# Patient Record
Sex: Female | Born: 1950
Health system: Southern US, Community
[De-identification: ages and names within clinical notes are randomized; demographics above are authoritative.]

## PROBLEM LIST (undated history)

## (undated) DIAGNOSIS — C50412 Malignant neoplasm of upper-outer quadrant of left female breast: Secondary | ICD-10-CM

## (undated) DIAGNOSIS — Z1501 Genetic susceptibility to malignant neoplasm of breast: Secondary | ICD-10-CM

## (undated) DIAGNOSIS — Z800 Family history of malignant neoplasm of digestive organs: Secondary | ICD-10-CM

## (undated) DIAGNOSIS — C50919 Malignant neoplasm of unspecified site of unspecified female breast: Secondary | ICD-10-CM

## (undated) DIAGNOSIS — E559 Vitamin D deficiency, unspecified: Secondary | ICD-10-CM

## (undated) DIAGNOSIS — Z803 Family history of malignant neoplasm of breast: Secondary | ICD-10-CM

## (undated) DIAGNOSIS — Z1509 Genetic susceptibility to other malignant neoplasm: Secondary | ICD-10-CM

## (undated) DIAGNOSIS — IMO0002 Reserved for concepts with insufficient information to code with codable children: Secondary | ICD-10-CM

## (undated) DIAGNOSIS — R112 Nausea with vomiting, unspecified: Secondary | ICD-10-CM

## (undated) DIAGNOSIS — Z923 Personal history of irradiation: Secondary | ICD-10-CM

## (undated) DIAGNOSIS — J309 Allergic rhinitis, unspecified: Secondary | ICD-10-CM

## (undated) DIAGNOSIS — E785 Hyperlipidemia, unspecified: Secondary | ICD-10-CM

## (undated) DIAGNOSIS — Z9221 Personal history of antineoplastic chemotherapy: Secondary | ICD-10-CM

## (undated) DIAGNOSIS — M1990 Unspecified osteoarthritis, unspecified site: Secondary | ICD-10-CM

## (undated) DIAGNOSIS — K5792 Diverticulitis of intestine, part unspecified, without perforation or abscess without bleeding: Secondary | ICD-10-CM

## (undated) DIAGNOSIS — M8580 Other specified disorders of bone density and structure, unspecified site: Secondary | ICD-10-CM

## (undated) DIAGNOSIS — I10 Essential (primary) hypertension: Secondary | ICD-10-CM

## (undated) DIAGNOSIS — I728 Aneurysm of other specified arteries: Secondary | ICD-10-CM

## (undated) DIAGNOSIS — G629 Polyneuropathy, unspecified: Secondary | ICD-10-CM

## (undated) DIAGNOSIS — D649 Anemia, unspecified: Secondary | ICD-10-CM

## (undated) DIAGNOSIS — Z9889 Other specified postprocedural states: Secondary | ICD-10-CM

## (undated) HISTORY — DX: Anemia, unspecified: D64.9

## (undated) HISTORY — PX: OTHER SURGICAL HISTORY: SHX169

## (undated) HISTORY — DX: Hyperlipidemia, unspecified: E78.5

## (undated) HISTORY — DX: Vitamin D deficiency, unspecified: E55.9

## (undated) HISTORY — DX: Diverticulitis of intestine, part unspecified, without perforation or abscess without bleeding: K57.92

## (undated) HISTORY — PX: LUMBAR DISC SURGERY: SHX700

## (undated) HISTORY — DX: Other specified disorders of bone density and structure, unspecified site: M85.80

## (undated) HISTORY — DX: Unspecified osteoarthritis, unspecified site: M19.90

## (undated) HISTORY — DX: Family history of malignant neoplasm of breast: Z80.3

## (undated) HISTORY — PX: SPINE SURGERY: SHX786

## (undated) HISTORY — DX: Aneurysm of other specified arteries: I72.8

## (undated) HISTORY — PX: TONSILLECTOMY: SUR1361

## (undated) HISTORY — DX: Reserved for concepts with insufficient information to code with codable children: IMO0002

## (undated) HISTORY — DX: Family history of malignant neoplasm of digestive organs: Z80.0

## (undated) HISTORY — DX: Essential (primary) hypertension: I10

## (undated) HISTORY — DX: Allergic rhinitis, unspecified: J30.9

---
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
## 1996-09-01 HISTORY — PX: SPINE SURGERY: SHX786

## 1996-09-01 HISTORY — PX: CERVICAL DISC SURGERY: SHX588

## 1998-05-01 ENCOUNTER — Encounter: Admission: RE | Admit: 1998-05-01 | Discharge: 1998-07-30 | Payer: Self-pay | Admitting: Neurological Surgery

## 1998-06-18 ENCOUNTER — Ambulatory Visit (HOSPITAL_COMMUNITY): Admission: RE | Admit: 1998-06-18 | Discharge: 1998-06-18 | Payer: Self-pay | Admitting: Obstetrics and Gynecology

## 1998-07-31 ENCOUNTER — Encounter: Payer: Self-pay | Admitting: Neurological Surgery

## 1998-07-31 ENCOUNTER — Ambulatory Visit (HOSPITAL_COMMUNITY): Admission: RE | Admit: 1998-07-31 | Discharge: 1998-07-31 | Payer: Self-pay | Admitting: Neurological Surgery

## 1998-11-20 ENCOUNTER — Encounter: Admission: RE | Admit: 1998-11-20 | Discharge: 1998-12-12 | Payer: Self-pay | Admitting: Neurological Surgery

## 1999-12-10 ENCOUNTER — Encounter: Payer: Self-pay | Admitting: Neurological Surgery

## 1999-12-10 ENCOUNTER — Ambulatory Visit (HOSPITAL_COMMUNITY): Admission: RE | Admit: 1999-12-10 | Discharge: 1999-12-10 | Payer: Self-pay | Admitting: Neurological Surgery

## 1999-12-24 ENCOUNTER — Encounter: Admission: RE | Admit: 1999-12-24 | Discharge: 1999-12-24 | Payer: Self-pay | Admitting: Obstetrics and Gynecology

## 1999-12-24 ENCOUNTER — Encounter: Payer: Self-pay | Admitting: Obstetrics and Gynecology

## 2000-02-28 ENCOUNTER — Other Ambulatory Visit: Admission: RE | Admit: 2000-02-28 | Discharge: 2000-02-28 | Payer: Self-pay | Admitting: Obstetrics and Gynecology

## 2000-05-20 ENCOUNTER — Encounter: Payer: Self-pay | Admitting: Neurological Surgery

## 2000-05-20 ENCOUNTER — Encounter: Admission: RE | Admit: 2000-05-20 | Discharge: 2000-05-20 | Payer: Self-pay | Admitting: Neurological Surgery

## 2000-06-11 ENCOUNTER — Encounter: Payer: Self-pay | Admitting: Neurological Surgery

## 2000-06-15 ENCOUNTER — Encounter: Payer: Self-pay | Admitting: Neurological Surgery

## 2000-06-15 ENCOUNTER — Ambulatory Visit (HOSPITAL_COMMUNITY): Admission: RE | Admit: 2000-06-15 | Discharge: 2000-06-16 | Payer: Self-pay | Admitting: Neurological Surgery

## 2000-08-19 ENCOUNTER — Encounter: Payer: Self-pay | Admitting: Neurological Surgery

## 2000-08-19 ENCOUNTER — Encounter: Admission: RE | Admit: 2000-08-19 | Discharge: 2000-08-19 | Payer: Self-pay | Admitting: Neurological Surgery

## 2001-04-15 ENCOUNTER — Encounter: Payer: Self-pay | Admitting: Internal Medicine

## 2001-04-15 ENCOUNTER — Ambulatory Visit (HOSPITAL_COMMUNITY): Admission: RE | Admit: 2001-04-15 | Discharge: 2001-04-15 | Payer: Self-pay | Admitting: Internal Medicine

## 2001-04-19 ENCOUNTER — Encounter: Payer: Self-pay | Admitting: Obstetrics and Gynecology

## 2001-04-19 ENCOUNTER — Encounter: Admission: RE | Admit: 2001-04-19 | Discharge: 2001-04-19 | Payer: Self-pay | Admitting: Obstetrics and Gynecology

## 2001-06-15 ENCOUNTER — Other Ambulatory Visit: Admission: RE | Admit: 2001-06-15 | Discharge: 2001-06-15 | Payer: Self-pay | Admitting: Obstetrics and Gynecology

## 2001-09-16 ENCOUNTER — Ambulatory Visit (HOSPITAL_COMMUNITY): Admission: RE | Admit: 2001-09-16 | Discharge: 2001-09-16 | Payer: Self-pay | Admitting: Anesthesiology

## 2002-04-20 ENCOUNTER — Encounter: Admission: RE | Admit: 2002-04-20 | Discharge: 2002-04-20 | Payer: Self-pay | Admitting: Neurological Surgery

## 2002-04-20 ENCOUNTER — Encounter: Payer: Self-pay | Admitting: Neurological Surgery

## 2003-04-05 ENCOUNTER — Encounter: Payer: Self-pay | Admitting: Obstetrics and Gynecology

## 2003-04-05 ENCOUNTER — Encounter: Admission: RE | Admit: 2003-04-05 | Discharge: 2003-04-05 | Payer: Self-pay | Admitting: Obstetrics and Gynecology

## 2005-04-24 ENCOUNTER — Encounter: Admission: RE | Admit: 2005-04-24 | Discharge: 2005-04-24 | Payer: Self-pay | Admitting: Obstetrics and Gynecology

## 2006-08-26 ENCOUNTER — Encounter: Admission: RE | Admit: 2006-08-26 | Discharge: 2006-08-26 | Payer: Self-pay | Admitting: Internal Medicine

## 2007-09-27 ENCOUNTER — Encounter: Admission: RE | Admit: 2007-09-27 | Discharge: 2007-09-27 | Payer: Self-pay | Admitting: Internal Medicine

## 2009-02-02 ENCOUNTER — Encounter: Admission: RE | Admit: 2009-02-02 | Discharge: 2009-02-02 | Payer: Self-pay | Admitting: Neurological Surgery

## 2009-03-29 ENCOUNTER — Ambulatory Visit (HOSPITAL_COMMUNITY): Admission: RE | Admit: 2009-03-29 | Discharge: 2009-03-30 | Payer: Self-pay | Admitting: Neurological Surgery

## 2009-07-03 ENCOUNTER — Encounter: Admission: RE | Admit: 2009-07-03 | Discharge: 2009-07-03 | Payer: Self-pay | Admitting: Internal Medicine

## 2010-09-19 ENCOUNTER — Other Ambulatory Visit
Admission: RE | Admit: 2010-09-19 | Discharge: 2010-09-19 | Payer: Self-pay | Source: Home / Self Care | Admitting: Internal Medicine

## 2010-11-12 ENCOUNTER — Other Ambulatory Visit: Payer: Self-pay | Admitting: Internal Medicine

## 2010-11-12 DIAGNOSIS — Z1231 Encounter for screening mammogram for malignant neoplasm of breast: Secondary | ICD-10-CM

## 2010-11-18 ENCOUNTER — Ambulatory Visit
Admission: RE | Admit: 2010-11-18 | Discharge: 2010-11-18 | Disposition: A | Discharge status: Home / Self Care | Payer: BC Managed Care – PPO | Source: Ambulatory Visit | Attending: Internal Medicine | Admitting: Internal Medicine

## 2010-11-18 DIAGNOSIS — Z1231 Encounter for screening mammogram for malignant neoplasm of breast: Secondary | ICD-10-CM

## 2010-12-08 LAB — BASIC METABOLIC PANEL
Calcium: 10.1 mg/dL (ref 8.4–10.5)
GFR calc non Af Amer: >60 mL/min (ref >60)
Glucose, Bld: 102 mg/dL — ABNORMAL HIGH (ref 70–99)
Sodium: 137 mEq/L (ref 135–145)

## 2010-12-08 LAB — CBC
MCHC: 33.2 g/dL (ref 30.0–36.0)
RBC: 4.57 MIL/uL (ref 3.87–5.11)
WBC: 10.4 10*3/uL (ref 4.0–10.5)

## 2011-01-14 NOTE — Op Note (Signed)
Jacqueline Jenkins, Jacqueline Jenkins                 ACCOUNT NO.:  [id number]   MEDICAL RECORD NO.:  [id number]          PATIENT TYPE:  OIB   LOCATION:  3537                         FACILITY:  [facility]   PHYSICIAN:  Stefani Dama, M.D.  DATE OF BIRTH:  1951/05/31   DATE OF PROCEDURE:  03/29/2009  DATE OF DISCHARGE:                               OPERATIVE REPORT   PREOPERATIVE DIAGNOSES:  1. Lumbar spondylosis.  2. Lumbar radiculopathy secondary to herniated nucleus pulposus, L5-S1      right with extraforaminal herniation of disk.   POSTOPERATIVE DIAGNOSES:  1. Lumbar spondylosis.  2. Lumbar radiculopathy secondary to herniated nucleus pulposus, L5-S1      right with extraforaminal herniation of disk.   PROCEDURE:  Matrix intraforaminal and extraforaminal microdiskectomy  with operating microscope, L5-S1 right   SURGEON:  Stefani Dama, MD   FIRST ASSISTANT:  Cristi Loron, MD   ANESTHESIA:  General endotracheal.   INDICATIONS:  Ryin is a 60 year old individual who has had significant  lumbar spondylosis with radicular pain in L5 distribution.  She has an  extruded fragment of disk at L5-S1, but this is the fragment extends  into the foramen, appears to be compromising at L5 nerve root.  After  failing efforts at conservative management, she was advised regarding  surgical intervention via Matrix intraforaminal and extraforaminal  decompression.   PROCEDURE:  The patient was brought to the operating room supine on a  stretcher.  After smooth induction of general endotracheal anesthesia,  she was turned prone.  Back was prepped with alcohol and DuraPrep and  draped in sterile fashion.  Fluoroscopic guidance was used to localize  L5-S1 both intraforaminally and extraforaminally with the region of the  pars being identified and a K-wire was passed to the region of the pars  and anchored on it.  A small vertical incision was created over the K-  wire and then a series of dilators were  passed over the K-wire to use a  winding technique to provide soft tissue dissection in the depths  ultimately a 5-cm deep x 18-mm wide.  Endoscopic cannula was fixed to  the operating table with a clamp.  Through this aperture, the operating  microscope was placed to view the deep structures.  The soft tissues  were cleared from the pars of L5 and the laminar arch of L5 was also  identified.  The laminotomy was then created using high-speed drill and  2.3-mm dissecting tool to create a hole in the laminar arch medial to  the pars.  This was right at the level of the disk space and by  dissecting intraforaminally along the lateral aspect of the dura.  This  L5 nerve root was identified superiorly and immediately adjacent to the  L5 nerve root were found some intraforaminal portions of disk material.  These were decompressed.  There was felt that there was further material  out in the lateral recess of the foramen.  At this point, an  extraforaminal dissection was undertaken by moving the endoscope  laterally over the region of the pars  where the soft tissues were  cleared.  The superior articular process of S1 was drilled down with a  high-speed drill and 2.3-mm dissecting tool, lateral aspect of the pars  and the superior portion of the inferior articular process was also  drilled down.  In this region then, we were able to identify the L5  nerve root that it was bowed dorsally and dissection below the L5 nerve  root found a number of fragments that were compressing the L5 nerve  root.  These were removed in a piecemeal fashion and dissection was  continued until all the fragments were removed in this area.  With this,  the lateral aspect of the nerve root and the medial aspect were well  decompressed.  Returned to the intraforaminal portion of the  decompression yielded no other fragments of disk and now the shoulder of  the root could be easily sounded and found to be free and well   decompressed.  With this, hemostasis in the soft tissues intra and  extraforaminally was obtained meticulously and then the microscope was  removed.  The wound was irrigated with antibiotic irrigating solution.  Then, the lumbodorsal fascia was closed with a singular 3-0 Vicryl  suture and the skin, subcutaneous, and subcuticular tissues were also  closed with 3-0 Vicryl.  Dermabond was placed on the skin.  Blood loss  for this procedure was estimated less than 50 mL.      Stefani Dama, M.D.  Electronically Signed     HJE/MEDQ  D:  03/29/2009  T:  03/30/2009  Job:  [id number]

## 2011-01-17 NOTE — Op Note (Signed)
. [facility]  Patient:    SHALECE, STAFFA Visit Number: [id number] MRN: [id number]          Service Type: END Location: ENDO Attending Physician:  Orland Mustard Dictated by:   Llana Aliment. Randa Evens, M.D. Proc. Date: 09/16/01 Admit Date:  09/16/2001   CC:         Marinus Maw, M.D.   Operative Report  PROCEDURE PERFORMED:  Colonoscopy.  ENDOSCOPIST:  Llana Aliment. Randa Evens, M.D.  MEDICATIONS USED:  Fentanyl 50 mcg.  Versed 5 mg IV.  INSTRUMENT:  Pediatric Olympus video colonoscope.  INDICATIONS:  Strong family history of colon polyps, colon cancer as well as pancreatic cancer.  This is done as a screening procedure.  DESCRIPTION OF PROCEDURE:  The procedure had been explained to the patient and consent obtained.  With the patient in the left lateral decubitus position, the Olympus video colonoscope was inserted and advanced under direct visualization.  The prep was excellent and we were able to advance to the cecum without difficulty.  The scope was withdrawn.  The cecum, ascending colon, hepatic flexure, transverse colon, splenic flexure, descending and sigmoid colon were seen well upon withdrawal.  Scope withdrawn, patient tolerated the procedure well.  Maintained on low flow oxygen and pulse oximeter.  No polyps were seen throughout the entire colon.  No significant diverticular disease.  ASSESSMENT:  No evidence of polyps in this high risk individual.  PLAN: Will recommend repeating in five years. Dictated by:   Llana Aliment. Randa Evens, M.D. Attending Physician:  Orland Mustard DD:  09/16/01 TD:  09/16/01 Job: [id number]

## 2011-01-17 NOTE — Op Note (Signed)
[facility]  Patient:    Jacqueline Jenkins, Jacqueline Jenkins                        MRN: [id number] Proc. Date: 06/15/00 Adm. Date:  [id number] Attending:  Jonne Ply                           Operative Report  PREOPERATIVE DIAGNOSIS:  L4-5 herniated nucleus pulposus on the right with right lumbar radiculopathy.  POSTOPERATIVE DIAGNOSIS:  L4-5 herniated nucleus pulposus on the right with right lumbar radiculopathy.  PROCEDURE:  Right lumbar microendoscopic diskectomy L4-5 with operating microscope and microdissection technique.  SURGEON:  Stefani Dama, M.D.  ANESTHESIA:  General endotracheal anesthesia.  INDICATIONS:  The patient is a 60 year old individual who has had significant back and right lower extremity pain.  She has demonstrated herniated nucleus pulposus at L4-5 on the right side.  She has failed conservative efforts and is advised regarding surgical treatment.  PROCEDURE:  The patient was brought to the operating room supine on the stretcher.  After smooth induction of general endotracheal anesthesia she was turned prone and the back was shaved and prepped with Duraprep and draped in a sterile fashion.  The L4-5 space was localized radiographically and the insertion site was chosen for a K-wire.  After infiltrating this area with 1% lidocaine with epinephrine making a small incision in the area, the K-wire was passed down with lateral fluoroscopic projections being obtained.  Then a series of dilators from the Met-RX system was passed to the 18 mm size.  A 5 cm deep cannula was inserted and this was affixed to the operating table.  The microscope was draped and brought into the field.  Then, using microdissection technique, the laminar arch was cleared of muscular attachment.  The yellow ligament was cleared also and a laminotomy was created removing the inferior marginal lamina of L4 out to the mesial wall of the facet.  A series of  punctures and rongeurs were used to clear the remaining yellow ligament, mesial wall facet and the common dural tube was exposed.  The L5 nerve root was exposed just lateral to this region.  It was noted to be tented ventrally over a mass.  This was found to be significant disk herniation in the subligamentous space.  After dissecting the nerve root with microdissection technique and cauterizing some epidural veins around it and dividing these with microscissors, the nerve root was mobilized medially.  The disk was identified and incised in vertical fashion and several small fragments of disk material were evacuated from the subligamentous space. There was more disk material that was attached to the annular region and this required a more complete dissection of the disk space using a combination of curets, rongeurs and pituitary instruments so as to evacuate the disc space of significant quantity of markedly degenerated disk material.  This was pursued both medially and laterally and in the end, good clearance of the disk space was obtained.  Hemostasis from the soft tissue was obtained with bipolar cautery.  Care was taken to protect the L5 nerve root in the common dural tube during this entire procedure.  No dural tears or cerebrospinal fluid leaks were encountered.  The area once well decompressed was checked.  It was noted that the L5 nerve root was fully decompressed.  The common dural tube was free and clear of any obstruction.  After copious irrigating with antibiotic irrigating solution, the cannula was removed.  A singular 2-0 Vicryl suture was placed in the figure-of-eight fashion and the fascia.  Subcuticular 3-0 Vicryl was used to close the skin.  Blood loss estimated at 100 cc.  The patient tolerated the procedure well. DD:  06/15/00 TD:  06/15/00 Job: [id number]

## 2011-09-02 DIAGNOSIS — I728 Aneurysm of other specified arteries: Secondary | ICD-10-CM

## 2011-09-02 HISTORY — DX: Aneurysm of other specified arteries: I72.8

## 2011-12-01 ENCOUNTER — Other Ambulatory Visit: Payer: Self-pay | Admitting: Physician Assistant

## 2011-12-01 ENCOUNTER — Other Ambulatory Visit: Payer: Self-pay | Admitting: Internal Medicine

## 2011-12-01 DIAGNOSIS — Z1231 Encounter for screening mammogram for malignant neoplasm of breast: Secondary | ICD-10-CM

## 2011-12-01 DIAGNOSIS — M8580 Other specified disorders of bone density and structure, unspecified site: Secondary | ICD-10-CM

## 2011-12-01 HISTORY — DX: Other specified disorders of bone density and structure, unspecified site: M85.80

## 2011-12-05 ENCOUNTER — Ambulatory Visit
Admission: RE | Admit: 2011-12-05 | Discharge: 2011-12-05 | Disposition: A | Discharge status: Home / Self Care | Payer: BC Managed Care – PPO | Source: Ambulatory Visit | Attending: Physician Assistant | Admitting: Physician Assistant

## 2011-12-05 DIAGNOSIS — Z1231 Encounter for screening mammogram for malignant neoplasm of breast: Secondary | ICD-10-CM

## 2011-12-18 ENCOUNTER — Other Ambulatory Visit (HOSPITAL_COMMUNITY): Payer: Self-pay | Admitting: Internal Medicine

## 2011-12-18 DIAGNOSIS — Z13820 Encounter for screening for osteoporosis: Secondary | ICD-10-CM

## 2011-12-19 ENCOUNTER — Other Ambulatory Visit (HOSPITAL_COMMUNITY): Payer: Self-pay | Admitting: Internal Medicine

## 2011-12-19 DIAGNOSIS — R109 Unspecified abdominal pain: Secondary | ICD-10-CM

## 2011-12-19 DIAGNOSIS — R7989 Other specified abnormal findings of blood chemistry: Secondary | ICD-10-CM

## 2011-12-23 ENCOUNTER — Ambulatory Visit (HOSPITAL_COMMUNITY)
Admission: RE | Admit: 2011-12-23 | Discharge: 2011-12-23 | Disposition: A | Discharge status: Home / Self Care | Payer: BC Managed Care – PPO | Source: Ambulatory Visit | Attending: Internal Medicine | Admitting: Internal Medicine

## 2011-12-23 DIAGNOSIS — Z780 Asymptomatic menopausal state: Secondary | ICD-10-CM | POA: Insufficient documentation

## 2011-12-23 DIAGNOSIS — R109 Unspecified abdominal pain: Secondary | ICD-10-CM

## 2011-12-23 DIAGNOSIS — R7989 Other specified abnormal findings of blood chemistry: Secondary | ICD-10-CM

## 2011-12-23 DIAGNOSIS — Z13820 Encounter for screening for osteoporosis: Secondary | ICD-10-CM | POA: Insufficient documentation

## 2011-12-24 ENCOUNTER — Other Ambulatory Visit: Payer: Self-pay | Admitting: Internal Medicine

## 2011-12-24 DIAGNOSIS — R9389 Abnormal findings on diagnostic imaging of other specified body structures: Secondary | ICD-10-CM

## 2011-12-26 ENCOUNTER — Ambulatory Visit
Admission: RE | Admit: 2011-12-26 | Discharge: 2011-12-26 | Disposition: A | Discharge status: Home / Self Care | Payer: BC Managed Care – PPO | Source: Ambulatory Visit | Attending: Internal Medicine | Admitting: Internal Medicine

## 2011-12-26 DIAGNOSIS — R9389 Abnormal findings on diagnostic imaging of other specified body structures: Secondary | ICD-10-CM

## 2011-12-26 MED ORDER — IOHEXOL 300 MG/ML  SOLN
125.0000 mL | Freq: Once | INTRAMUSCULAR | Status: AC | PRN
  Administered 2011-12-26: 125 mL via INTRAVENOUS

## 2012-01-13 ENCOUNTER — Encounter: Payer: Self-pay | Admitting: Surgery

## 2012-01-15 LAB — HM COLONOSCOPY

## 2012-01-16 ENCOUNTER — Encounter: Payer: Self-pay | Admitting: Surgery

## 2012-01-19 ENCOUNTER — Ambulatory Visit (INDEPENDENT_AMBULATORY_CARE_PROVIDER_SITE_OTHER): Payer: BC Managed Care – PPO | Admitting: Surgery

## 2012-01-19 ENCOUNTER — Encounter: Payer: Self-pay | Admitting: Surgery

## 2012-01-19 VITALS — BP 138/83 | HR 77 | Temp 98.5°F | Ht 65.5 in | Wt 188.0 lb

## 2012-01-19 DIAGNOSIS — I728 Aneurysm of other specified arteries: Secondary | ICD-10-CM | POA: Insufficient documentation

## 2012-01-19 NOTE — Progress Notes (Signed)
[facility] [place]   Patient name: Jacqueline Jenkins MRN: [id number] DOB: 05/24/1951 Sex: female   Referred by: Dr. McKeown  Reason for referral:  Chief Complaint  Patient presents with  . AAA    New pt/ Dr. Mckallam    HISTORY OF PRESENT ILLNESS: Patient is referred today for evaluation of a newly diagnosed splenic artery aneurysm. She initially was sent for a bone density scan. She began having pain between her shoulder blades as well as postprandial nausea. This led to an ultrasound to evaluate for gallstones. A calcified left-sided mass was seen and was subsequently imaged with CT scan. This shows a large 4.6 x 4.0 cm splenic artery aneurysm without evidence of rupture. The patient reports that her initial symptoms have resolved. She denies back pain or left right upper quadrant pain.  The patient is married with 2 children who are in their 40s. She is medically managed for hypertension. Her blood pressure usually runs in the 120s over 70s. She also suffers from hypercholesterolemia however she cannot take a statins secondary to elevated liver enzymes. She does not smoke.  Past Medical History  Diagnosis Date  . Hypertension   . Hyperlipidemia   . DDD (degenerative disc disease)   . Anemia     Past Surgical History  Procedure Date  . Tonsillectomy   . Spine surgery 1998    cervical diskectomy  . Spine surgery 2001, 2010    Lumbar diskectomy  . Cervical disc surgery 1998  . Lumbar disc surgery 2001, 2010    History   Social History  . Marital Status: Married    Spouse Name: N/A    Number of Children: N/A  . Years of Education: N/A   Occupational History  . Not on file.   Social History Main Topics  . Smoking status: Never Smoker   . Smokeless tobacco: Never Used  . Alcohol Use: Yes     3-4 times per year  . Drug Use: No  . Sexually Active: Not on file   Other Topics Concern  . Not on file   Social History Narrative  . No narrative on  file    Family History  Problem Relation Age of Onset  . Stroke Mother   . Osteoporosis Mother   . Hyperlipidemia Mother   . Hypertension Mother   . Other Mother     varicose veins  . Cancer Maternal Grandmother 41    Breast cancer  . Other Father     bleeding problems    Allergies as of 01/19/2012 - Review Complete 01/19/2012  Allergen Reaction Noted  . Lescol (fluvastatin sodium)  01/13/2012  . Lipitor (atorvastatin)  01/13/2012  . Vytorin (ezetimibe-simvastatin)  01/13/2012    Current Outpatient Prescriptions on File Prior to Visit  Medication Sig Dispense Refill  . benazepril-hydrochlorthiazide (LOTENSIN HCT) 20-12.5 MG per tablet Take 1 tablet by mouth daily.      . colesevelam (WELCHOL) 625 MG tablet Take 1,875 mg by mouth 2 (two) times daily with a meal.      . Multiple Vitamin (MULTIVITAMIN) tablet Take 1 tablet by mouth daily.      . Vitamin D, Ergocalciferol, (DRISDOL) 50000 UNITS CAPS Take 50,000 Units by mouth every 7 (seven) days.         REVIEW OF SYSTEMS: Cardiovascular: No chest pain, chest pressure, orthopnea, or dyspnea on exertion. No claudication or rest pain,  No history of DVT or phlebitis. Positive for varicose veins, positive   for palpitations. She does have pain in her legs from walking and lying flat. Pulmonary: No productive cough, asthma or wheezing. Neurologic: No weakness, paresthesias, aphasia, or amaurosis. No dizziness. Hematologic: No bleeding problems or clotting disorders. Musculoskeletal: No joint pain or joint swelling. Gastrointestinal: No blood in stool or hematemesis Genitourinary: No dysuria or hematuria. Psychiatric:: No history of major depression. Integumentary: No rashes or ulcers. Constitutional: No fever or chills.  PHYSICAL EXAMINATION: General: The patient appears their stated age.  Vital signs are BP 138/83  Pulse 77  Temp(Src) 98.5 F (36.9 C) (Oral)  Ht 5' 5.5" (1.664 m)  Wt 188 lb (85.276 kg)  BMI 30.81 kg/m2   SpO2 98% HEENT:  No gross abnormalities Pulmonary: Respirations are non-labored Abdomen: Soft and non-tender. No bruits were auscultated. Pulsatile mass was not felt Musculoskeletal: There are no major deformities.   Neurologic: No focal weakness or paresthesias are detected, Skin: There are no ulcer or rashes noted. Psychiatric: The patient has normal affect. Cardiovascular: There is a regular rate and rhythm without significant murmur appreciated. No carotid bruits. Palpable pedal pulses.  Diagnostic Studies: I have reviewed the patient's CT scan which shows a large splenic artery aneurysm, 4.6 x 4.3 x 4.0   Assessment:  Large splenic artery aneurysm Plan: I discussed with the patient our options for management. These include continued observation which I would argue against given the size of her aneurysm. We also discussed open surgical repair which would include aneurysmorrhaphy versus resection, versus splenectomy. We then talked about minimally invasive measures. These would be limited to coil embolization versus covered stent placement. We have elected to proceed with endovascular repair. I've recommended that she go ahead and get vaccinated prior to her procedure. I discussed the potential risks which would include pain from postsplenectomy syndrome. She also understands that FI embolize the splenic artery she could have splenic infarction which in the worse case an area with require splenectomy. Her procedure has been scheduled for June 11. This will give her time to get her vaccines. She was told that if she experiences left upper quadrant pain to notify me versus contact EMS.     V. Wells Della Scrivener IV, M.D. [facility] [place] Office: [phone] Pager:  [phone]   

## 2012-01-28 ENCOUNTER — Other Ambulatory Visit: Payer: Self-pay

## 2012-01-30 ENCOUNTER — Encounter (HOSPITAL_COMMUNITY): Payer: Self-pay | Admitting: Pharmacy Technician

## 2012-02-09 MED ORDER — SODIUM CHLORIDE 0.9 % IV SOLN
INTRAVENOUS | Status: DC
  Administered 2012-02-10: 06:00:00 via INTRAVENOUS

## 2012-02-10 ENCOUNTER — Encounter (HOSPITAL_COMMUNITY): Payer: Self-pay | Admitting: *Deleted

## 2012-02-10 ENCOUNTER — Telehealth: Payer: Self-pay | Admitting: Surgery

## 2012-02-10 ENCOUNTER — Observation Stay (HOSPITAL_COMMUNITY)
Admission: RE | Admit: 2012-02-10 | Discharge: 2012-02-11 | Disposition: A | Discharge status: Home / Self Care | Payer: BC Managed Care – PPO | Source: Ambulatory Visit | Attending: Surgery | Admitting: Surgery

## 2012-02-10 ENCOUNTER — Other Ambulatory Visit: Payer: Self-pay

## 2012-02-10 ENCOUNTER — Encounter (HOSPITAL_COMMUNITY)
Admission: RE | Disposition: A | Discharge status: Home / Self Care | Payer: Self-pay | Source: Ambulatory Visit | Attending: Surgery

## 2012-02-10 DIAGNOSIS — I728 Aneurysm of other specified arteries: Principal | ICD-10-CM | POA: Insufficient documentation

## 2012-02-10 DIAGNOSIS — IMO0002 Reserved for concepts with insufficient information to code with codable children: Secondary | ICD-10-CM | POA: Insufficient documentation

## 2012-02-10 DIAGNOSIS — E785 Hyperlipidemia, unspecified: Secondary | ICD-10-CM | POA: Insufficient documentation

## 2012-02-10 DIAGNOSIS — I10 Essential (primary) hypertension: Secondary | ICD-10-CM | POA: Insufficient documentation

## 2012-02-10 HISTORY — PX: VISCERAL ANGIOGRAM: SHX5515

## 2012-02-10 HISTORY — PX: OTHER SURGICAL HISTORY: SHX169

## 2012-02-10 LAB — POCT I-STAT, CHEM 8
BUN: 19 mg/dL (ref 6–23)
Calcium, Ion: 1.23 mmol/L (ref 1.12–1.32)
Creatinine, Ser: 0.8 mg/dL (ref 0.50–1.10)
Hemoglobin: 13.9 g/dL (ref 12.0–15.0)
Sodium: 138 mEq/L (ref 135–145)
TCO2: 23 mmol/L (ref 0–100)

## 2012-02-10 SURGERY — VISCERAL ANGIOGRAM
Anesthesia: LOCAL

## 2012-02-10 MED ORDER — ADULT MULTIVITAMIN W/MINERALS CH
1.0000 | ORAL_TABLET | Freq: Every day | ORAL | Status: DC
  Administered 2012-02-10: 1 via ORAL
  Filled 2012-02-10 (×2): qty 1

## 2012-02-10 MED ORDER — SODIUM CHLORIDE 0.9 % IV SOLN
INTRAVENOUS | Status: DC
  Administered 2012-02-10 (×3): via INTRAVENOUS

## 2012-02-10 MED ORDER — MIDAZOLAM HCL 2 MG/2ML IJ SOLN
INTRAMUSCULAR | Status: AC
  Filled 2012-02-10: qty 2

## 2012-02-10 MED ORDER — FENTANYL CITRATE 0.05 MG/ML IJ SOLN
INTRAMUSCULAR | Status: AC
  Filled 2012-02-10: qty 2

## 2012-02-10 MED ORDER — COLESEVELAM HCL 3.75 G PO PACK
3.7500 g | PACK | Freq: Every day | ORAL | Status: DC
Start: 2012-02-10 — End: 2012-02-10

## 2012-02-10 MED ORDER — HYDRALAZINE HCL 20 MG/ML IJ SOLN
10.0000 mg | INTRAMUSCULAR | Status: DC | PRN
  Filled 2012-02-10: qty 0.5

## 2012-02-10 MED ORDER — ONDANSETRON HCL 4 MG/2ML IJ SOLN: 4.0000 mg | Freq: Four times a day (QID) | INTRAMUSCULAR | Status: DC | PRN

## 2012-02-10 MED ORDER — HYDROCHLOROTHIAZIDE 12.5 MG PO CAPS
12.5000 mg | ORAL_CAPSULE | Freq: Every day | ORAL | Status: DC
  Administered 2012-02-10: 12.5 mg via ORAL
  Filled 2012-02-10 (×2): qty 1

## 2012-02-10 MED ORDER — COLESEVELAM HCL 625 MG PO TABS
1875.0000 mg | ORAL_TABLET | Freq: Two times a day (BID) | ORAL | Status: DC
  Administered 2012-02-10: 1875 mg via ORAL
  Filled 2012-02-10 (×4): qty 3

## 2012-02-10 MED ORDER — BENAZEPRIL-HYDROCHLOROTHIAZIDE 20-12.5 MG PO TABS
1.0000 | ORAL_TABLET | Freq: Every day | ORAL | Status: DC
Start: 2012-02-10 — End: 2012-02-10

## 2012-02-10 MED ORDER — MORPHINE SULFATE 2 MG/ML IJ SOLN: 2.0000 mg | INTRAMUSCULAR | Status: DC | PRN

## 2012-02-10 MED ORDER — HEPARIN SODIUM (PORCINE) 1000 UNIT/ML IJ SOLN
INTRAMUSCULAR | Status: AC
  Filled 2012-02-10: qty 1

## 2012-02-10 MED ORDER — LIDOCAINE HCL (PF) 1 % IJ SOLN
INTRAMUSCULAR | Status: AC
  Filled 2012-02-10: qty 30

## 2012-02-10 MED ORDER — GUAIFENESIN-DM 100-10 MG/5ML PO SYRP: 15.0000 mL | ORAL_SOLUTION | ORAL | Status: DC | PRN

## 2012-02-10 MED ORDER — PHENOL 1.4 % MT LIQD
1.0000 | OROMUCOSAL | Status: DC | PRN
  Filled 2012-02-10: qty 177

## 2012-02-10 MED ORDER — ACETAMINOPHEN 650 MG RE SUPP: 325.0000 mg | RECTAL | Status: DC | PRN

## 2012-02-10 MED ORDER — METOPROLOL TARTRATE 1 MG/ML IV SOLN: 2.0000 mg | INTRAVENOUS | Status: DC | PRN

## 2012-02-10 MED ORDER — HEPARIN (PORCINE) IN NACL 2-0.9 UNIT/ML-% IJ SOLN
INTRAMUSCULAR | Status: AC
  Filled 2012-02-10: qty 1000

## 2012-02-10 MED ORDER — ALUM & MAG HYDROXIDE-SIMETH 200-200-20 MG/5ML PO SUSP: 15.0000 mL | ORAL | Status: DC | PRN

## 2012-02-10 MED ORDER — ACETAMINOPHEN 325 MG PO TABS: 325.0000 mg | ORAL_TABLET | ORAL | Status: DC | PRN

## 2012-02-10 MED ORDER — VITAMIN D (ERGOCALCIFEROL) 1.25 MG (50000 UNIT) PO CAPS: 50000.0000 [IU] | ORAL_CAPSULE | ORAL | Status: DC

## 2012-02-10 MED ORDER — LABETALOL HCL 5 MG/ML IV SOLN
10.0000 mg | INTRAVENOUS | Status: DC | PRN
  Filled 2012-02-10: qty 4

## 2012-02-10 MED ORDER — ONE-DAILY MULTI VITAMINS PO TABS
1.0000 | ORAL_TABLET | Freq: Every day | ORAL | Status: DC
Start: 2012-02-10 — End: 2012-02-10

## 2012-02-10 MED ORDER — OXYCODONE-ACETAMINOPHEN 5-325 MG PO TABS: 1.0000 | ORAL_TABLET | ORAL | Status: DC | PRN

## 2012-02-10 MED ORDER — BENAZEPRIL HCL 20 MG PO TABS
20.0000 mg | ORAL_TABLET | Freq: Every day | ORAL | Status: DC
  Administered 2012-02-10: 20 mg via ORAL
  Filled 2012-02-10 (×2): qty 1

## 2012-02-10 NOTE — H&P (View-Only) (Signed)
[facility] [place]   Patient name: Jacqueline Jenkins MRN: [id number] DOB: [birth year] Sex: female   Referred by: Dr. Oneta Rack  Reason for referral:  Chief Complaint  Patient presents with  . AAA    New pt/ Dr. Epimenio Foot    HISTORY OF PRESENT ILLNESS: Patient is referred today for evaluation of a newly diagnosed splenic artery aneurysm. She initially was sent for a bone density scan. She began having pain between her shoulder blades as well as postprandial nausea. This led to an ultrasound to evaluate for gallstones. A calcified left-sided mass was seen and was subsequently imaged with CT scan. This shows a large 4.6 x 4.0 cm splenic artery aneurysm without evidence of rupture. The patient reports that her initial symptoms have resolved. She denies back pain or left right upper quadrant pain.  The patient is married with 2 children who are in their 41s. She is medically managed for hypertension. Her blood pressure usually runs in the 120s over 70s. She also suffers from hypercholesterolemia however she cannot take a statins secondary to elevated liver enzymes. She does not smoke.  Past Medical History  Diagnosis Date  . Hypertension   . Hyperlipidemia   . DDD (degenerative disc disease)   . Anemia     Past Surgical History  Procedure Date  . Tonsillectomy   . Spine surgery 1998    cervical diskectomy  . Spine surgery 2001, 2010    Lumbar diskectomy  . Cervical disc surgery 1998  . Lumbar disc surgery 2001, 2010    History   Social History  . Marital Status: Married    Spouse Name: N/A    Number of Children: N/A  . Years of Education: N/A   Occupational History  . Not on file.   Social History Main Topics  . Smoking status: Never Smoker   . Smokeless tobacco: Never Used  . Alcohol Use: Yes     3-4 times per year  . Drug Use: No  . Sexually Active: Not on file   Other Topics Concern  . Not on file   Social History Narrative  . No narrative on  file    Family History  Problem Relation Age of Onset  . Stroke Mother   . Osteoporosis Mother   . Hyperlipidemia Mother   . Hypertension Mother   . Other Mother     varicose veins  . Cancer Maternal Grandmother 93    Breast cancer  . Other Father     bleeding problems    Allergies as of 01/19/2012 - Review Complete 01/19/2012  Allergen Reaction Noted  . Lescol (fluvastatin sodium)  01/13/2012  . Lipitor (atorvastatin)  01/13/2012  . Vytorin (ezetimibe-simvastatin)  01/13/2012    Current Outpatient Prescriptions on File Prior to Visit  Medication Sig Dispense Refill  . benazepril-hydrochlorthiazide (LOTENSIN HCT) 20-12.5 MG per tablet Take 1 tablet by mouth daily.      . colesevelam (WELCHOL) 625 MG tablet Take 1,875 mg by mouth 2 (two) times daily with a meal.      . Multiple Vitamin (MULTIVITAMIN) tablet Take 1 tablet by mouth daily.      . Vitamin D, Ergocalciferol, (DRISDOL) 50000 UNITS CAPS Take 50,000 Units by mouth every 7 (seven) days.         REVIEW OF SYSTEMS: Cardiovascular: No chest pain, chest pressure, orthopnea, or dyspnea on exertion. No claudication or rest pain,  No history of DVT or phlebitis. Positive for varicose veins, positive  for palpitations. She does have pain in her legs from walking and lying flat. Pulmonary: No productive cough, asthma or wheezing. Neurologic: No weakness, paresthesias, aphasia, or amaurosis. No dizziness. Hematologic: No bleeding problems or clotting disorders. Musculoskeletal: No joint pain or joint swelling. Gastrointestinal: No blood in stool or hematemesis Genitourinary: No dysuria or hematuria. Psychiatric:: No history of major depression. Integumentary: No rashes or ulcers. Constitutional: No fever or chills.  PHYSICAL EXAMINATION: General: The patient appears their stated age.  Vital signs are BP 138/83  Pulse 77  Temp(Src) 98.5 F (36.9 C) (Oral)  Ht 5' 5.5" (1.664 m)  Wt 188 lb (85.276 kg)  BMI 30.81 kg/m2   SpO2 98% HEENT:  No gross abnormalities Pulmonary: Respirations are non-labored Abdomen: Soft and non-tender. No bruits were auscultated. Pulsatile mass was not felt Musculoskeletal: There are no major deformities.   Neurologic: No focal weakness or paresthesias are detected, Skin: There are no ulcer or rashes noted. Psychiatric: The patient has normal affect. Cardiovascular: There is a regular rate and rhythm without significant murmur appreciated. No carotid bruits. Palpable pedal pulses.  Diagnostic Studies: I have reviewed the patient's CT scan which shows a large splenic artery aneurysm, 4.6 x 4.3 x 4.0   Assessment:  Large splenic artery aneurysm Plan: I discussed with the patient our options for management. These include continued observation which I would argue against given the size of her aneurysm. We also discussed open surgical repair which would include aneurysmorrhaphy versus resection, versus splenectomy. We then talked about minimally invasive measures. These would be limited to coil embolization versus covered stent placement. We have elected to proceed with endovascular repair. I've recommended that she go ahead and get vaccinated prior to her procedure. I discussed the potential risks which would include pain from postsplenectomy syndrome. She also understands that FI embolize the splenic artery she could have splenic infarction which in the worse case an area with require splenectomy. Her procedure has been scheduled for June 11. This will give her time to get her vaccines. She was told that if she experiences left upper quadrant pain to notify me versus contact EMS.     Jorge Ny, M.D. [facility] [place] Office: [phone] Pager:  [phone]

## 2012-02-10 NOTE — Interval H&P Note (Signed)
History and Physical Interval Note:  02/10/2012 7:24 AM  Jacqueline Jenkins  has presented today for surgery, with the diagnosis of aaa  The various methods of treatment have been discussed with the patient and family. After consideration of risks, benefits and other options for treatment, the patient has consented to  Procedure(s) (LRB): VISCERAL ANGIOGRAM (N/A) as a surgical intervention .  The patients' history has been reviewed, patient examined, no change in status, stable for surgery.  I have reviewed the patients' chart and labs.  Questions were answered to the patient's satisfaction.     Dhanya Bogle IV, V. WELLS

## 2012-02-10 NOTE — Telephone Encounter (Addendum)
Message copied by Shari Prows on Tue Feb 10, 2012  2:45 PM ------      Message from: Melene Plan      Created: Tue Feb 10, 2012 11:12 AM                   ----- Message -----         From: Nada Libman, MD         Sent: 02/10/2012  10:05 AM           To: Reuel Derby, Melene Plan, RN            02/10/2012, the patient had the following procedures:             1.  ultrasound access right femoral artery       2.  abdominal aortogram       3.  second order catheterization (splenic artery       4.  splenic artery angiogram       5.  coil embolization splenic artery                  Please schedule the patient to come back to see me in one month with a CT angiogram of the abdomen and pelvis. She will be admitted overnight for observation.  Pt is still @ [facility] so I am unable to schedule CT appt. I have scheduled an appt for pt to see tfe on 03/08/12. I will contact pt when all appts are scheduled. Jacklyn Shell

## 2012-02-10 NOTE — Op Note (Signed)
[facility] [place]  Patient name: Jacqueline Jenkins MRN: [id number] DOB: [birth year] Sex: female  02/10/2012 Pre-operative Diagnosis: Splenic artery aneurysm Post-operative diagnosis:  Same Surgeon:  Jorge Ny Procedure Performed:  1.  ultrasound access right femoral artery  2.  abdominal aortogram  3.  second order catheterization (splenic artery  4.  splenic artery angiogram  5.  coil embolization splenic artery    Indications:  The patient was incidentally found to have a large splenic artery aneurysm on CT scan. She comes in today for potential endovascular exclusion. Risks and benefits were discussed extensively with the patient in the clinic note.  Procedure:  The patient was identified in the holding area and taken to room 8.  The patient was then placed supine on the table and prepped and draped in the usual sterile fashion.  A time out was called.  Ultrasound was used to evaluate the right common femoral artery.  It was patent .  A digital ultrasound image was acquired.  The right femoral artery with was then accessed with an 18-gauge needle. This was done under ultrasound guidance. An 035 wire was advanced without resistance. The tract was dilated with a 6 [place] dilator. A pro-glide device was placed for pre-closure. The second device I had difficulty placing and so I just used one for pre-closure a 7 French sheath was then placed as the patient did have bleeding around the access site. Over the wire an omni-flush catheter was advanced into the descending thoracic aorta and an aortogram was performed. Next using a SOS catheter access into the celiac artery was gained. I then used a Glidewire and a straight endhole catheter to manipulate access into the splenic artery where multiple arteriograms were performed to find the ideal projection to Findings:   Aortogram:  The supraceliac abdominal aorta order showed no significant disease. The celiac artery and its  major branches are widely patent. There is a large splenic artery aneurysm.  Splenic artery angiogram:  A large and tortuous splenic artery is identified with approximately a 5 cm aneurysm.  Intervention:  At this point I elected to proceed with intervention. My initial hope was to place a covered stent across this area. I upsized to a 7 [place] Ansel 1 sheath and positioned this at the origin of the celiac artery. Next using a Glidewire and a 035 quick cross was able to gain access through the aneurysm to the outflow vessel. Because of tortuosity however I could not get adequate wire access beyond the aneurysm to where I felt I could have a stable platform to place a stent. I therefore elected to perform embolization of her splenic artery aneurysm. There appeared to be only one inflow and 1 outflow vessel. I initially treated the outflow vessel. A 10 mm nester coil was placed. This did not coil as intended. It was likely slightly oversized. I then placed an 8 mm coil x2 followed by several 6 mm coils. Multiple followup studies were performed until I was satisfied with occlusion of the outflow vessel to the aneurysm. I then withdrew the quick cross catheter to the inflow vessel. I placed 26 mm nester coils here. Followup studies revealed successful exclusion of the splenic artery aneurysm. I then performed an ejection which showed preservation of the patency of the remaining branches of the celiac artery. At this point times the sheath and wire were removed and the pro-glide device was used to close the artery. There were no complications.  Impression:  #1  successful exclusion of a large splenic artery aneurysm using coil embolization    V. Durene Cal, M.D. [facility] [place] Office: [phone] Pager:  [phone]

## 2012-02-11 ENCOUNTER — Telehealth: Payer: Self-pay | Admitting: Surgery

## 2012-02-11 ENCOUNTER — Other Ambulatory Visit: Payer: Self-pay | Admitting: *Deleted

## 2012-02-11 DIAGNOSIS — I714 Abdominal aortic aneurysm, without rupture, unspecified: Secondary | ICD-10-CM

## 2012-02-11 LAB — BASIC METABOLIC PANEL
CO2: 25 mEq/L (ref 19–32)
Calcium: 9 mg/dL (ref 8.4–10.5)
GFR calc non Af Amer: 90 mL/min — ABNORMAL LOW (ref >90)
Glucose, Bld: 114 mg/dL — ABNORMAL HIGH (ref 70–99)
Potassium: 3.7 mEq/L (ref 3.5–5.1)
Sodium: 138 mEq/L (ref 135–145)

## 2012-02-11 LAB — CBC
Hemoglobin: 12 g/dL (ref 12.0–15.0)
MCHC: 33.8 g/dL (ref 30.0–36.0)
Platelets: 227 10*3/uL (ref 150–400)
RBC: 3.92 MIL/uL (ref 3.87–5.11)

## 2012-02-11 MED ORDER — PSEUDOEPHEDRINE HCL ER 120 MG PO TB12: 120.0000 mg | ORAL_TABLET | Freq: Two times a day (BID) | ORAL | Status: AC

## 2012-02-11 MED ORDER — PSEUDOEPHEDRINE HCL ER 120 MG PO TB12
120.0000 mg | ORAL_TABLET | Freq: Two times a day (BID) | ORAL | Status: DC
  Administered 2012-02-11: 120 mg via ORAL
  Filled 2012-02-11 (×2): qty 1

## 2012-02-11 NOTE — Progress Notes (Signed)
[facility] Progress Note  02/11/2012 7:55 AM POD 1  Subjective:  "I feel like I have fluid in my ear and it was hurting last week and settled down, but started hurting again last night" Denies any nausea/vomiting  Afebrile x 24 hours  VSS   97%RA Filed Vitals:   02/11/12 0427  BP: 139/75  Pulse: 80  Temp: 98.6 F (37 C)  Resp: 18    Physical Exam: Incisions:  Right groin soft without hematoma Extremities:  RLE warm with palpable right DP  CBC    Component Value Date/Time   WBC 8.2 02/11/2012 0635   RBC 3.92 02/11/2012 0635   HGB 12.0 02/11/2012 0635   HCT 35.5* 02/11/2012 0635   PLT 227 02/11/2012 0635   MCV 90.6 02/11/2012 0635   MCH 30.6 02/11/2012 0635   MCHC 33.8 02/11/2012 0635   RDW 13.6 02/11/2012 0635    BMET    Component Value Date/Time   NA 138 02/11/2012 0635   K 3.7 02/11/2012 0635   CL 104 02/11/2012 0635   CO2 25 02/11/2012 0635   GLUCOSE 114* 02/11/2012 0635   BUN 10 02/11/2012 0635   CREATININE 0.73 02/11/2012 0635   CALCIUM 9.0 02/11/2012 0635   GFRNONAA 90* 02/11/2012 0635   GFRAA >90 02/11/2012 0635    INR No results found for this basename: inr     Intake/Output Summary (Last 24 hours) at 02/11/12 0755 Last data filed at 02/11/12 0742  Gross per 24 hour  Intake   1560 ml  Output      0 ml  Net   1560 ml     Assessment/Plan:  61 y.o. female is s/p:   1. ultrasound access right femoral artery  2. abdominal aortogram  3. second order catheterization (splenic artery  4. splenic artery angiogram  5. coil embolization splenic artery   POD 1  -d/c IVF -she may have decongestant and f/u with PCP for possible fluid on her ear. -doing well from splenic artery coil embolization  Doreatha Massed, PA-C [facility] [phone] 02/11/2012 7:55 AM

## 2012-02-11 NOTE — Discharge Summary (Signed)
[facility] Discharge Summary  LIBBI TOWNER 11-Feb-1951 61 y.o. female  [id number]  Admission Date: 02/10/2012  Discharge Date: 02/11/12  Physician: Nada Libman, MD  Admission Diagnosis: aaa   HPI:   This is a 61 y.o. female referred today for evaluation of a newly diagnosed splenic artery aneurysm. She initially was sent for a bone density scan. She began having pain between her shoulder blades as well as postprandial nausea. This led to an ultrasound to evaluate for gallstones. A calcified left-sided mass was seen and was subsequently imaged with CT scan. This shows a large 4.6 x 4.0 cm splenic artery aneurysm without evidence of rupture. The patient reports that her initial symptoms have resolved. She denies back pain or left right upper quadrant pain.  The patient is married with 2 children who are in their 33s. She is medically managed for hypertension. Her blood pressure usually runs in the 120s over 70s. She also suffers from hypercholesterolemia however she cannot take a statins secondary to elevated liver enzymes. She does not smoke.   Hospital Course:  The patient was admitted to the hospital and taken to the operating room on 02/10/2012 and underwent   1. ultrasound access right femoral artery  2. abdominal aortogram  3. second order catheterization (splenic artery  4. splenic artery angiogram  5. coil embolization splenic artery  The pt tolerated the procedure well and was transported to the PACU in good condition. By POD 1, she is doing well without any N/V.  She does however complain of possible fluid on her ear and pain.  She was given sudafed and advised to f/u with her PCP at discharge.  The remainder of the hospital course consisted of increasing mobilization and increasing intake of solids without difficulty.  CBC    Component Value Date/Time   WBC 8.2 02/11/2012 0635   RBC 3.92 02/11/2012 0635   HGB 12.0 02/11/2012 0635   HCT 35.5*  02/11/2012 0635   PLT 227 02/11/2012 0635   MCV 90.6 02/11/2012 0635   MCH 30.6 02/11/2012 0635   MCHC 33.8 02/11/2012 0635   RDW 13.6 02/11/2012 0635    BMET    Component Value Date/Time   NA 138 02/11/2012 0635   K 3.7 02/11/2012 0635   CL 104 02/11/2012 0635   CO2 25 02/11/2012 0635   GLUCOSE 114* 02/11/2012 0635   BUN 10 02/11/2012 0635   CREATININE 0.73 02/11/2012 0635   CALCIUM 9.0 02/11/2012 0635   GFRNONAA 90* 02/11/2012 0635   GFRAA >90 02/11/2012 4098     Discharge Instructions:   The patient is discharged to home with extensive instructions on wound care and progressive ambulation.  They are instructed not to drive or perform any heavy lifting until returning to see the physician in his office.  Discharge Orders    Future Appointments: Provider: Department: Dept Phone: Center:   03/08/2012 12:00 PM Nada Libman, MD [facility] [phone] [facility]     Future Orders Please Complete By Expires   Resume previous diet      Driving Restrictions      Comments:   No driving for 24 hours and while taking pain medication.   Lifting restrictions      Comments:   No lifting for 6 weeks   Call MD for:  temperature >100.5      Call MD for:  redness, tenderness, or signs of infection (pain, swelling, bleeding, redness, odor or green/yellow discharge around incision site)  Call MD for:  severe or increased pain, loss or decreased feeling  in affected limb(s)      Discharge wound care:      Comments:   Remove dressing and Shower daily with soap and water starting 02/11/12      Discharge Diagnosis:  aaa  Secondary Diagnosis: Patient Active Problem List  Diagnosis  . Abdominal aneurysm without mention of rupture  . Aneurysm of splenic artery   Past Medical History  Diagnosis Date  . Hypertension   . Hyperlipidemia   . DDD (degenerative disc disease)   . Anemia       Uniqua, Kihn  Home Medication Instructions [id number]   Printed on:02/11/12 0818  Medication  Information                    benazepril-hydrochlorthiazide (LOTENSIN HCT) 20-12.5 MG per tablet Take 1 tablet by mouth daily.           Multiple Vitamin (MULTIVITAMIN) tablet Take 1 tablet by mouth daily.           Vitamin D, Ergocalciferol, (DRISDOL) 50000 UNITS CAPS Take 50,000 Units by mouth every 7 (seven) days.           Flaxseed, Linseed, (FLAXSEED OIL) 1000 MG CAPS Take 1,000 mg by mouth daily.           Colesevelam HCl (WELCHOL) 3.75 G PACK Take 3.75 g by mouth daily.           Omega-3 Fatty Acids (FISH OIL) 1200 MG CAPS Take by mouth.           Calcium Carbonate-Vitamin D (CALCIUM + D PO) Take 1 tablet by mouth 2 (two) times daily.           pseudoephedrine (SUDAFED) 120 MG 12 hr tablet Take 1 tablet (120 mg total) by mouth 2 (two) times daily.             Disposition: home  Patient's condition: is Good  Follow up: 1. Dr. Myra Gianotti in 4 weeks with a CT scan 2. PCP for ear pain and possible fluid on ear after discharge.   Doreatha Massed, PA-C [facility] [phone] 02/11/2012  8:18 AM

## 2012-02-11 NOTE — Telephone Encounter (Signed)
I scheduled an appointment for the patient for a CTA on 03/08/12 at 11am--pt is to arrive at 10:45am. And then see [facility] here at [facility] at 12noon. Pt verbalized understanding of appts. I also mailed her a letter in regards to these appts. Jacklyn Shell

## 2012-02-16 NOTE — Discharge Summary (Signed)
Agree with above  Wells Alauna Hayden 

## 2012-03-03 ENCOUNTER — Other Ambulatory Visit: Payer: Self-pay | Admitting: *Deleted

## 2012-03-03 DIAGNOSIS — I728 Aneurysm of other specified arteries: Secondary | ICD-10-CM

## 2012-03-03 DIAGNOSIS — Z48812 Encounter for surgical aftercare following surgery on the circulatory system: Secondary | ICD-10-CM

## 2012-03-08 ENCOUNTER — Ambulatory Visit
Admission: RE | Admit: 2012-03-08 | Discharge: 2012-03-08 | Disposition: A | Discharge status: Home / Self Care | Payer: BC Managed Care – PPO | Source: Ambulatory Visit | Attending: Surgery | Admitting: Surgery

## 2012-03-08 ENCOUNTER — Other Ambulatory Visit: Payer: BC Managed Care – PPO

## 2012-03-08 ENCOUNTER — Ambulatory Visit: Payer: BC Managed Care – PPO | Admitting: Surgery

## 2012-03-08 DIAGNOSIS — I728 Aneurysm of other specified arteries: Secondary | ICD-10-CM

## 2012-03-08 DIAGNOSIS — Z48812 Encounter for surgical aftercare following surgery on the circulatory system: Secondary | ICD-10-CM

## 2012-03-08 MED ORDER — IOHEXOL 350 MG/ML SOLN
100.0000 mL | Freq: Once | INTRAVENOUS | Status: AC | PRN
  Administered 2012-03-08: 100 mL via INTRAVENOUS

## 2012-03-12 ENCOUNTER — Encounter: Payer: Self-pay | Admitting: Surgery

## 2012-03-15 ENCOUNTER — Ambulatory Visit (INDEPENDENT_AMBULATORY_CARE_PROVIDER_SITE_OTHER): Payer: BC Managed Care – PPO | Admitting: Surgery

## 2012-03-15 ENCOUNTER — Encounter: Payer: Self-pay | Admitting: Surgery

## 2012-03-15 ENCOUNTER — Ambulatory Visit: Payer: BC Managed Care – PPO | Admitting: Surgery

## 2012-03-15 VITALS — BP 138/80 | HR 77 | Resp 18 | Ht 65.0 in | Wt 184.2 lb

## 2012-03-15 DIAGNOSIS — I728 Aneurysm of other specified arteries: Secondary | ICD-10-CM

## 2012-03-15 DIAGNOSIS — Z48812 Encounter for surgical aftercare following surgery on the circulatory system: Secondary | ICD-10-CM

## 2012-03-15 NOTE — Progress Notes (Signed)
[facility] [place]   Patient name: Jacqueline Jenkins MRN: [id number] DOB: 1951/02/27 Sex: female     Chief Complaint  Patient presents with  . Aneurysm    Splenic Artery aneurysm- one month f/up with lab work of [employer]. Img.    HISTORY OF PRESENT ILLNESS: This is the patient's first visit since having undergone splenic artery aneurysm embolization on 02/10/2012. She denies any complaints at this time. First procedure was done without complications.  Past Medical History  Diagnosis Date  . Hypertension   . Hyperlipidemia   . DDD (degenerative disc disease)   . Anemia   . AAA (abdominal aortic aneurysm)     Past Surgical History  Procedure Date  . Tonsillectomy   . Spine surgery 1998    cervical diskectomy  . Spine surgery 2001, 2010    Lumbar diskectomy  . Cervical disc surgery 1998  . Lumbar disc surgery 2001, 2010  . Splenic aneurysm 02/10/2012    Aneurysm of splenic artery    History   Social History  . Marital Status: Married    Spouse Name: N/A    Number of Children: N/A  . Years of Education: N/A   Occupational History  . Not on file.   Social History Main Topics  . Smoking status: Never Smoker   . Smokeless tobacco: Never Used  . Alcohol Use: Yes     3-4 times per year  . Drug Use: No  . Sexually Active: Not on file   Other Topics Concern  . Not on file   Social History Narrative  . No narrative on file    Family History  Problem Relation Age of Onset  . Stroke Mother   . Osteoporosis Mother   . Hyperlipidemia Mother   . Hypertension Mother   . Other Mother     varicose veins  . Cancer Maternal Grandmother 91    Breast cancer  . Other Father     bleeding problems    Allergies as of 03/15/2012 - Review Complete 03/15/2012  Allergen Reaction Noted  . Lescol (fluvastatin sodium)  01/13/2012  . Lipitor (atorvastatin)  01/13/2012  . Vytorin (ezetimibe-simvastatin)  01/13/2012    Current Outpatient Prescriptions on File  Prior to Visit  Medication Sig Dispense Refill  . benazepril-hydrochlorthiazide (LOTENSIN HCT) 20-12.5 MG per tablet Take 1 tablet by mouth daily.      . Calcium Carbonate-Vitamin D (CALCIUM + D PO) Take 1 tablet by mouth 2 (two) times daily.      . Colesevelam HCl (WELCHOL) 3.75 G PACK Take 3.75 g by mouth daily.      . Flaxseed, Linseed, (FLAXSEED OIL) 1000 MG CAPS Take 1,000 mg by mouth daily.      . Multiple Vitamin (MULTIVITAMIN) tablet Take 1 tablet by mouth daily.      . Omega-3 Fatty Acids (FISH OIL) 1200 MG CAPS Take by mouth.      . Vitamin D, Ergocalciferol, (DRISDOL) 50000 UNITS CAPS Take 50,000 Units by mouth every 7 (seven) days.      . pseudoephedrine (SUDAFED) 120 MG 12 hr tablet Take 1 tablet (120 mg total) by mouth 2 (two) times daily.         REVIEW OF SYSTEMS: No change to prior visit  PHYSICAL EXAMINATION:   Vital signs are BP 138/80  Pulse 77  Resp 18  Ht 5\' 5"  (1.651 m)  Wt 184 lb 3.2 oz (83.553 kg)  BMI 30.65 kg/m2  SpO2 98%  General: The patient appears their stated age. HEENT:  No gross abnormalities Pulmonary:  Non labored breathing Abdomen: Soft and non-tender Musculoskeletal: There are no major deformities. Neurologic: No focal weakness or paresthesias are detected, Skin: There are no ulcer or rashes noted. Psychiatric: The patient has normal affect. Cardiovascular: right groin site ok   Diagnostic Studies CTA was reviewed.  The aneurysm is excluded  Assessment: Status post embolization of the splenic artery aneurysm Plan: By CT scan the aneurysm has been completely excluded. She appears to be without sequelae from her embolization. She will come back in 6 months for a abdominal ultrasound to further evaluate her repair.  Jorge Ny, M.D. [facility] [place] Office: [phone] Pager:  [phone]

## 2012-03-15 NOTE — Addendum Note (Signed)
Addended by: Sharee Pimple on: 03/15/2012 02:36 PM   Modules accepted: Orders

## 2012-09-07 HISTORY — PX: COLONOSCOPY: SHX174

## 2012-09-24 ENCOUNTER — Encounter: Payer: Self-pay | Admitting: Surgery

## 2012-09-27 ENCOUNTER — Ambulatory Visit (INDEPENDENT_AMBULATORY_CARE_PROVIDER_SITE_OTHER): Payer: BC Managed Care – PPO | Admitting: Surgery

## 2012-09-27 ENCOUNTER — Encounter: Payer: Self-pay | Admitting: Surgery

## 2012-09-27 ENCOUNTER — Encounter (INDEPENDENT_AMBULATORY_CARE_PROVIDER_SITE_OTHER): Payer: BC Managed Care – PPO | Admitting: *Deleted

## 2012-09-27 VITALS — BP 145/76 | HR 67 | Resp 16 | Ht 65.5 in | Wt 179.9 lb

## 2012-09-27 DIAGNOSIS — I728 Aneurysm of other specified arteries: Secondary | ICD-10-CM

## 2012-09-27 DIAGNOSIS — Z48812 Encounter for surgical aftercare following surgery on the circulatory system: Secondary | ICD-10-CM

## 2012-09-27 NOTE — Addendum Note (Signed)
Addended by: Sharee Pimple on: 09/27/2012 01:51 PM   Modules accepted: Orders

## 2012-09-27 NOTE — Progress Notes (Signed)
[facility] [place]   Patient name: Jacqueline Jenkins MRN: [id number] DOB: 07/16/1951 Sex: female     Chief Complaint  Patient presents with  . Aneurysm    6 month f/up Splenic Artery Aneurysm with vascular Lab study.    HISTORY OF PRESENT ILLNESS: The patient is here today for followup. She is status post splenic artery aneurysm embolization on 02/10/2012. She initially was sent for a bone density scan. She began having pain between her shoulder blades as well as postprandial nausea. This led to an ultrasound to evaluate for gallstones. A calcified left-sided mass was seen and was subsequently imaged with CT scan. This shows a large 4.6 x 4.0 cm splenic artery aneurysm without evidence of rupture. She was fractionated prior to her procedure. She has done very well. Her initial CT scan revealed exclusion of the aneurysm however there was a lot of artifact, and therefore I brought her back today for ultrasound to evaluate her aneurysm. She has no complaints today.    Past Medical History  Diagnosis Date  . Hypertension   . Hyperlipidemia   . DDD (degenerative disc disease)   . Anemia   . AAA (abdominal aortic aneurysm)     Past Surgical History  Procedure Date  . Tonsillectomy   . Spine surgery 1998    cervical diskectomy  . Spine surgery 2001, 2010    Lumbar diskectomy  . Cervical disc surgery 1998  . Lumbar disc surgery 2001, 2010  . Splenic aneurysm 02/10/2012    Aneurysm of splenic artery  . Colonoscopy Jan. 7, 2014    History   Social History  . Marital Status: Married    Spouse Name: N/A    Number of Children: N/A  . Years of Education: N/A   Occupational History  . Not on file.   Social History Main Topics  . Smoking status: Never Smoker   . Smokeless tobacco: Never Used  . Alcohol Use: Yes     Comment: 3-4 times per year  . Drug Use: No  . Sexually Active: Not on file   Other Topics Concern  . Not on file   Social History Narrative    . No narrative on file    Family History  Problem Relation Age of Onset  . Stroke Mother   . Osteoporosis Mother   . Hyperlipidemia Mother   . Hypertension Mother   . Other Mother     varicose veins  . Cancer Maternal Grandmother 14    Breast cancer  . Other Father     bleeding problems    Allergies as of 09/27/2012 - Review Complete 09/27/2012  Allergen Reaction Noted  . Lescol (fluvastatin sodium)  01/13/2012  . Lipitor (atorvastatin)  01/13/2012  . Vytorin (ezetimibe-simvastatin)  01/13/2012    Current Outpatient Prescriptions on File Prior to Visit  Medication Sig Dispense Refill  . benazepril-hydrochlorthiazide (LOTENSIN HCT) 20-12.5 MG per tablet Take 1 tablet by mouth daily.      . Calcium Carbonate-Vitamin D (CALCIUM + D PO) Take 1 tablet by mouth 2 (two) times daily.      . Colesevelam HCl (WELCHOL) 3.75 G PACK Take 3.75 g by mouth 3 (three) times daily.       . Flaxseed, Linseed, (FLAXSEED OIL) 1000 MG CAPS Take 1,000 mg by mouth daily.      . Multiple Vitamin (MULTIVITAMIN) tablet Take 1 tablet by mouth daily.      . Omega-3 Fatty Acids (FISH OIL)  1200 MG CAPS Take by mouth.      . Vitamin D, Ergocalciferol, (DRISDOL) 50000 UNITS CAPS Take 50,000 Units by mouth every 7 (seven) days.      . pseudoephedrine (SUDAFED) 120 MG 12 hr tablet Take 1 tablet (120 mg total) by mouth 2 (two) times daily.         REVIEW OF SYSTEMS: No changes from prior visit  PHYSICAL EXAMINATION:   Vital signs are BP 145/76  Pulse 67  Resp 16  Ht 5' 5.5" (1.664 m)  Wt 179 lb 14.4 oz (81.602 kg)  BMI 29.48 kg/m2  SpO2 100% General: The patient appears their stated age. HEENT:  No gross abnormalities Pulmonary:  Non labored breathing Abdomen: Soft and non-tender normal active bowel sounds Musculoskeletal: There are no major deformities. Neurologic: No focal weakness or paresthesias are detected, Skin: There are no ulcer or rashes noted. Psychiatric: The patient has normal  affect. Cardiovascular: There is a regular rate and rhythm without significant murmur appreciated. No carotid bruits   Diagnostic Studies Ultrasound has been reviewed today. This shows a patent hepatic and superior mesenteric artery. There is no evidence of a splenic aneurysm visualized.  Assessment: Status post coil embolization of a splenic artery aneurysm Plan:  the patient's initial post procedure CT scan showed successful exclusion of the aneurysm. The aneurysm was not seen on ultrasound today. The patient has done very well to this point. I would like to get 1 more confirmatory study to make sure her aneurysm was completely excluded. I want to do this, particularly because the aneurysm was not visualized today on ultrasound. I feel this needs to be done with CT scan, however there will be the limitations of visualization do to artifact from the coils. I'll have this done in 6 months. She'll followup with me after her scan. I am looking to make sure that the aneurysm has not increased in size, and that contrast is not visualized within the aneurysm sac. I doubt, because of the significant calcification that this will regress significantly over time.  Jorge Ny, M.D. [facility] [place] Office: [phone] Pager:  [phone]

## 2013-03-17 LAB — HM HEPATITIS C SCREENING LAB: HM Hepatitis Screen: NEGATIVE

## 2013-03-25 ENCOUNTER — Other Ambulatory Visit: Payer: Self-pay | Admitting: Surgery

## 2013-03-25 ENCOUNTER — Telehealth: Payer: Self-pay | Admitting: Surgery

## 2013-03-25 ENCOUNTER — Encounter: Payer: Self-pay | Admitting: Surgery

## 2013-03-25 LAB — CREATININE, SERUM: Creat: 0.73 mg/dL (ref 0.50–1.10)

## 2013-03-25 LAB — BUN: BUN: 12 mg/dL (ref 6–23)

## 2013-03-25 NOTE — Telephone Encounter (Signed)
Message copied by Jena Gauss on Fri Mar 25, 2013  2:26 PM ------      Message from: Phillips Odor      Created: Fri Mar 25, 2013  9:38 AM      Regarding: needs lab work prior to CTA 7/28       Please contact pt. And schedule BUN, CR prior to scheduled CTA on Mon., 7/28.  This could either be done today, or early Monday AM.  If pt. Does lab work on 7/28, she will need to get this done by 8:30 AM and mark the lab STAT, so results are ready by 10:00 AM scan. ------

## 2013-03-28 ENCOUNTER — Encounter: Payer: Self-pay | Admitting: Surgery

## 2013-03-28 ENCOUNTER — Ambulatory Visit
Admission: RE | Admit: 2013-03-28 | Discharge: 2013-03-28 | Disposition: A | Discharge status: Home / Self Care | Payer: BC Managed Care – PPO | Source: Ambulatory Visit | Attending: Surgery | Admitting: Surgery

## 2013-03-28 ENCOUNTER — Ambulatory Visit (INDEPENDENT_AMBULATORY_CARE_PROVIDER_SITE_OTHER): Payer: BC Managed Care – PPO | Admitting: Surgery

## 2013-03-28 VITALS — BP 123/69 | HR 68 | Resp 16 | Ht 65.5 in | Wt 185.0 lb

## 2013-03-28 DIAGNOSIS — Z48812 Encounter for surgical aftercare following surgery on the circulatory system: Secondary | ICD-10-CM

## 2013-03-28 DIAGNOSIS — I714 Abdominal aortic aneurysm, without rupture, unspecified: Secondary | ICD-10-CM

## 2013-03-28 DIAGNOSIS — I728 Aneurysm of other specified arteries: Secondary | ICD-10-CM

## 2013-03-28 MED ORDER — IOHEXOL 350 MG/ML SOLN
80.0000 mL | Freq: Once | INTRAVENOUS | Status: AC | PRN
  Administered 2013-03-28: 80 mL via INTRAVENOUS

## 2013-03-28 NOTE — Progress Notes (Signed)
[facility]   Patient name: ANNTONETTE MADEWELL MRN: [id number] DOB: [birth year] Sex: female     Chief Complaint  Patient presents with  . AAA    6 mo f/up with CT scan today [facility].    HISTORY OF PRESENT ILLNESS: The patient comes back today for followup. She was initially found to have a large 5 cm splenic artery aneurysm during a workup for unrelated reasons. In June of 2013 she underwent coil embolization of the aneurysm. She is back today for followup. She reports no complaints today. She was previously vaccinated.  Past Medical History  Diagnosis Date  . Hypertension   . Hyperlipidemia   . DDD (degenerative disc disease)   . Anemia   . AAA (abdominal aortic aneurysm)   . Splenic artery aneurysm     Past Surgical History  Procedure Laterality Date  . Tonsillectomy    . Spine surgery  1998    cervical diskectomy  . Spine surgery  2001, 2010    Lumbar diskectomy  . Cervical disc surgery  1998  . Lumbar disc surgery  2001, 2010  . Splenic aneurysm  02/10/2012    Aneurysm of splenic artery  . Colonoscopy  Jan. 7, 2014    History   Social History  . Marital Status: Married    Spouse Name: N/A    Number of Children: N/A  . Years of Education: N/A   Occupational History  . Not on file.   Social History Main Topics  . Smoking status: Never Smoker   . Smokeless tobacco: Never Used  . Alcohol Use: Yes     Comment: 3-4 times per year  . Drug Use: No  . Sexually Active: Not on file   Other Topics Concern  . Not on file   Social History Narrative  . No narrative on file    Family History  Problem Relation Age of Onset  . Stroke Mother   . Osteoporosis Mother   . Hyperlipidemia Mother   . Hypertension Mother   . Other Mother     varicose veins  . Deep vein thrombosis Mother   . Cancer Maternal Grandmother 96    Breast cancer  . Other Father     bleeding problems  . Hypertension Father     Allergies as of 03/28/2013 - Review  Complete 03/28/2013  Allergen Reaction Noted  . Lescol (fluvastatin sodium)  01/13/2012  . Lipitor (atorvastatin)  01/13/2012  . Vytorin (ezetimibe-simvastatin)  01/13/2012    Current Outpatient Prescriptions on File Prior to Visit  Medication Sig Dispense Refill  . benazepril-hydrochlorthiazide (LOTENSIN HCT) 20-12.5 MG per tablet Take 1 tablet by mouth daily.      . Calcium Carbonate-Vitamin D (CALCIUM + D PO) Take 1 tablet by mouth 2 (two) times daily.      . Colesevelam HCl (WELCHOL) 3.75 G PACK Take 3.75 g by mouth 3 (three) times daily.       . Flaxseed, Linseed, (FLAXSEED OIL) 1000 MG CAPS Take 1,000 mg by mouth daily.      . Multiple Vitamin (MULTIVITAMIN) tablet Take 1 tablet by mouth daily.      . Omega-3 Fatty Acids (FISH OIL) 1200 MG CAPS Take by mouth.      . Vitamin D, Ergocalciferol, (DRISDOL) 50000 UNITS CAPS Take 50,000 Units by mouth every 7 (seven) days.       No current facility-administered medications on file prior to visit.     REVIEW  OF SYSTEMS: All systems negative  PHYSICAL EXAMINATION:   Vital signs are BP 123/69  Pulse 68  Resp 16  Ht 5' 5.5" (1.664 m)  Wt 185 lb (83.915 kg)  BMI 30.31 kg/m2  SpO2 98% General: The patient appears their stated age. HEENT:  No gross abnormalities Pulmonary:  Non labored breathing Abdomen: Soft and non-tender. No Abdominal bruits Musculoskeletal: There are no major deformities. Neurologic: No focal weakness or paresthesias are detected, Skin: There are no ulcer or rashes noted. Psychiatric: The patient has normal affect.   Diagnostic Studies I have reviewed her CT angiogram which shows complete exclusion of the large splenic aneurysm without evidence of contrast opacification within the aneurysm. The spleen is well perfused  Assessment: Status post embolization of large splenic artery aneurysm Plan: The aneurysm remains approximately 4.7 cm. I suspect this will not change as the periphery is heavily calcified.  I do not see any evidence of perfusion of the aneurysm. Her spleen as well perfused. I suspect this is completely resolved now. I will have her come back in 3 years for a repeat CT scan to make sure that there has been no changes in the aneurysm.  Jorge Ny, M.D. [facility] Office: [phone] Pager:  [phone]

## 2013-03-29 NOTE — Addendum Note (Signed)
Addended by: Sharee Pimple on: 03/29/2013 09:22 AM   Modules accepted: Orders

## 2013-08-11 ENCOUNTER — Ambulatory Visit: Payer: Self-pay | Admitting: Emergency Medicine

## 2013-09-13 ENCOUNTER — Encounter: Payer: Self-pay | Admitting: *Deleted

## 2013-09-14 ENCOUNTER — Other Ambulatory Visit: Payer: Self-pay

## 2013-09-14 ENCOUNTER — Ambulatory Visit: Payer: Self-pay | Admitting: Emergency Medicine

## 2013-09-14 DIAGNOSIS — Z1231 Encounter for screening mammogram for malignant neoplasm of breast: Secondary | ICD-10-CM

## 2013-09-16 ENCOUNTER — Encounter: Payer: Self-pay | Admitting: Emergency Medicine

## 2013-09-16 ENCOUNTER — Ambulatory Visit (INDEPENDENT_AMBULATORY_CARE_PROVIDER_SITE_OTHER): Payer: BC Managed Care – PPO | Admitting: Emergency Medicine

## 2013-09-16 VITALS — BP 130/84 | HR 66 | Temp 97.6°F | Resp 16 | Ht 65.0 in | Wt 188.0 lb

## 2013-09-16 DIAGNOSIS — E785 Hyperlipidemia, unspecified: Secondary | ICD-10-CM | POA: Insufficient documentation

## 2013-09-16 DIAGNOSIS — J309 Allergic rhinitis, unspecified: Secondary | ICD-10-CM | POA: Insufficient documentation

## 2013-09-16 DIAGNOSIS — R7309 Other abnormal glucose: Secondary | ICD-10-CM

## 2013-09-16 DIAGNOSIS — D649 Anemia, unspecified: Secondary | ICD-10-CM

## 2013-09-16 DIAGNOSIS — I10 Essential (primary) hypertension: Secondary | ICD-10-CM

## 2013-09-16 DIAGNOSIS — E782 Mixed hyperlipidemia: Secondary | ICD-10-CM

## 2013-09-16 DIAGNOSIS — M5136 Other intervertebral disc degeneration, lumbar region: Secondary | ICD-10-CM | POA: Insufficient documentation

## 2013-09-16 DIAGNOSIS — Z79899 Other long term (current) drug therapy: Secondary | ICD-10-CM

## 2013-09-16 DIAGNOSIS — IMO0002 Reserved for concepts with insufficient information to code with codable children: Secondary | ICD-10-CM | POA: Insufficient documentation

## 2013-09-16 DIAGNOSIS — E559 Vitamin D deficiency, unspecified: Secondary | ICD-10-CM

## 2013-09-16 LAB — LIPID PANEL
CHOL/HDL RATIO: 4.1 ratio
Cholesterol: 232 mg/dL — ABNORMAL HIGH (ref 0–200)
HDL: 56 mg/dL (ref >39)
LDL Cholesterol: 145 mg/dL — ABNORMAL HIGH (ref 0–99)
Triglycerides: 156 mg/dL — ABNORMAL HIGH (ref <150)
VLDL: 31 mg/dL (ref 0–40)

## 2013-09-16 LAB — CBC WITH DIFFERENTIAL/PLATELET
BASOS ABS: 0.2 10*3/uL — AB (ref 0.0–0.1)
BASOS PCT: 2 % — AB (ref 0–1)
Eosinophils Absolute: 0.5 10*3/uL (ref 0.0–0.7)
Eosinophils Relative: 8 % — ABNORMAL HIGH (ref 0–5)
HCT: 41.3 % (ref 36.0–46.0)
HEMOGLOBIN: 14 g/dL (ref 12.0–15.0)
Lymphocytes Relative: 19 % (ref 12–46)
Lymphs Abs: 1.2 10*3/uL (ref 0.7–4.0)
MCH: 30.2 pg (ref 26.0–34.0)
MCHC: 33.9 g/dL (ref 30.0–36.0)
MCV: 89.2 fL (ref 78.0–100.0)
Monocytes Absolute: 0.5 10*3/uL (ref 0.1–1.0)
Monocytes Relative: 8 % (ref 3–12)
NEUTROS PCT: 63 % (ref 43–77)
Neutro Abs: 4 10*3/uL (ref 1.7–7.7)
Platelets: 320 10*3/uL (ref 150–400)
RBC: 4.63 MIL/uL (ref 3.87–5.11)
RDW: 13.8 % (ref 11.5–15.5)
WBC: 6.4 10*3/uL (ref 4.0–10.5)

## 2013-09-16 LAB — BASIC METABOLIC PANEL WITH GFR
BUN: 12 mg/dL (ref 6–23)
CHLORIDE: 97 meq/L (ref 96–112)
CO2: 26 mEq/L (ref 19–32)
Calcium: 9.8 mg/dL (ref 8.4–10.5)
Creat: 0.81 mg/dL (ref 0.50–1.10)
GFR, EST AFRICAN AMERICAN: 89 mL/min
GFR, EST NON AFRICAN AMERICAN: 77 mL/min
Glucose, Bld: 94 mg/dL (ref 70–99)
Potassium: 4 mEq/L (ref 3.5–5.3)
SODIUM: 133 meq/L — AB (ref 135–145)

## 2013-09-16 LAB — HEPATIC FUNCTION PANEL
ALBUMIN: 4.2 g/dL (ref 3.5–5.2)
ALK PHOS: 68 U/L (ref 39–117)
ALT: 39 U/L — AB (ref 0–35)
AST: 31 U/L (ref 0–37)
BILIRUBIN TOTAL: 0.5 mg/dL (ref 0.3–1.2)
Bilirubin, Direct: 0.1 mg/dL (ref 0.0–0.3)
Indirect Bilirubin: 0.4 mg/dL (ref 0.0–0.9)
Total Protein: 7.2 g/dL (ref 6.0–8.3)

## 2013-09-16 LAB — HEMOGLOBIN A1C
HEMOGLOBIN A1C: 6 % — AB (ref <5.7)
MEAN PLASMA GLUCOSE: 126 mg/dL — AB (ref <117)

## 2013-09-16 NOTE — Patient Instructions (Signed)
PREDiabetes and Exercise Exercising regularly is important. It is not just about losing weight. It has many health benefits, such as:  Improving your overall fitness, flexibility, and endurance.  Increasing your bone density.  Helping with weight control.  Decreasing your body fat.  Increasing your muscle strength.  Reducing stress and tension.  Improving your overall health. People with diabetes who exercise gain additional benefits because exercise:  Reduces appetite.  Improves the body's use of blood sugar (glucose).  Helps lower or control blood glucose.  Decreases blood pressure.  Helps control blood lipids (such as cholesterol and triglycerides).  Improves the body's use of the hormone insulin by:  Increasing the body's insulin sensitivity.  Reducing the body's insulin needs.  Decreases the risk for heart disease because exercising:  Lowers cholesterol and triglycerides levels.  Increases the levels of good cholesterol (such as high-density lipoproteins [HDL]) in the body.  Lowers blood glucose levels. YOUR ACTIVITY PLAN  Choose an activity that you enjoy and set realistic goals. Your health care provider or diabetes educator can help you make an activity plan that works for you. You can break activities into 2 or 3 sessions throughout the day. Doing so is as good as one long session. Exercise ideas include:  Taking the dog for a walk.  Taking the stairs instead of the elevator.  Dancing to your favorite song.  Doing your favorite exercise with a friend. RECOMMENDATIONS FOR EXERCISING WITH TYPE 1 OR TYPE 2 DIABETES   Check your blood glucose before exercising. If blood glucose levels are greater than 240 mg/dL, check for urine ketones. Do not exercise if ketones are present.  Avoid injecting insulin into areas of the body that are going to be exercised. For example, avoid injecting insulin into:  The arms when playing tennis.  The legs when  jogging.  Keep a record of:  Food intake before and after you exercise.  Expected peak times of insulin action.  Blood glucose levels before and after you exercise.  The type and amount of exercise you have done.  Review your records with your health care provider. Your health care provider will help you to develop guidelines for adjusting food intake and insulin amounts before and after exercising.  If you take insulin or oral hypoglycemic agents, watch for signs and symptoms of hypoglycemia. They include:  Dizziness.  Shaking.  Sweating.  Chills.  Confusion.  Drink plenty of water while you exercise to prevent dehydration or heat stroke. Body water is lost during exercise and must be replaced.  Talk to your health care provider before starting an exercise program to make sure it is safe for you. Remember, almost any type of activity is better than none. Document Released: 11/08/2003 Document Revised: 04/20/2013 Document Reviewed: 01/25/2013 [facility] Patient Information 2014 [place]. Fat and Cholesterol Control Diet Your diet has an affect on your fat and cholesterol levels in your blood and organs. Too much fat and cholesterol in your blood can affect your:  Heart.  Blood vessels (arteries, veins).  Gallbladder.  Liver.  Pancreas. CONTROL FAT AND CHOLESTEROL WITH DIET Certain foods raise cholesterol and others lower it. It is important to replace bad fats with other types of fat.  Do not eat:  Fatty meats, such as hot dogs and salami.  Stick margarine and some tub margarines that have "partially hydrogenated oils" in them.  Baked goods, such as cookies and crackers that have "partially hydrogenated oils" in them.  Saturated tropical oils, such as  coconut and palm oil. Eat the following foods:  Round or loin cuts of red meat.  Chicken (without skin).  Fish.  Veal.  [place]ound [place] breast.  Shellfish.  Fruit, such as apples.  Vegetables,  such as broccoli, potatoes, and carrots.  Beans, peas, and lentils (legumes).  Grains, such as barley, rice, couscous, and bulgar wheat.  Pasta (without cream sauces). Look for foods that are nonfat, low in fat, and low in cholesterol.  FIND FOODS THAT ARE LOWER IN FAT AND CHOLESTEROL  Find foods with soluble fiber and plant sterols (phytosterol). You should eat 2 grams a day of these foods. These foods include:  Fruits.  Vegetables.  Whole grains.  Dried beans and peas.  Nuts and seeds.  Read package labels. Look for low-saturated fats, trans fat free, low-fat foods.  Choose cheese that have only 2 to 3 grams of saturated fat per ounce.  Use heart-healthy tub margarine that is free of trans fat or partially hydrogenated oil.  Avoid buying baked goods that have partially hydrogenated oils in them. Instead, buy baked goods made with whole grains (whole-wheat or whole oat flour). Avoid baked goods labeled with "flour" or "enriched flour."  Buy non-creamy canned soups with reduced salt and no added fats. PREPARING YOUR FOOD  Broil, bake, steam, or roast foods. Do not fry food.  Use non-stick cooking sprays.  Use lemon or herbs to flavor food instead of using butter or stick margarine.  Use nonfat yogurt, salsa, or low-fat dressings for salads. LOW-SATURATED FAT / LOW-FAT FOOD SUBSTITUTES  Meats / Saturated Fat (g)  Avoid: Steak, marbled (3 oz/85 g) / 11 g.  Choose: Steak, lean (3 oz/85 g) / 4 g.  Avoid: Hamburger (3 oz/85 g) / 7 g.  Choose: Hamburger, lean (3 oz/85 g) / 5 g.  Avoid: Ham (3 oz/85 g) / 6 g.  Choose: Ham, lean cut (3 oz/85 g) / 2.4 g.  Avoid: Chicken, with skin, dark meat (3 oz/85 g) / 4 g.  Choose: Chicken, skin removed, dark meat (3 oz/85 g) / 2 g.  Avoid: Chicken, with skin, light meat (3 oz/85 g) / 2.5 g.  Choose: Chicken, skin removed, light meat (3 oz/85 g) / 1 g. Dairy / Saturated Fat (g)  Avoid: Whole milk (1 cup) / 5 g.  Choose:  Low-fat milk, 2% (1 cup) / 3 g.  Choose: Low-fat milk, 1% (1 cup) / 1.5 g.  Choose: Skim milk (1 cup) / 0.3 g.  Avoid: Hard cheese (1 oz/28 g) / 6 g.  Choose: Skim milk cheese (1 oz/28 g) / 2 to 3 g.  Avoid: Cottage cheese, 4% fat (1 cup) / 6.5 g.  Choose: Low-fat cottage cheese, 1% fat (1 cup) / 1.5 g.  Avoid: Ice cream (1 cup) / 9 g.  Choose: Sherbet (1 cup) / 2.5 g.  Choose: Nonfat frozen yogurt (1 cup) / 0.3 g.  Choose: Frozen fruit bar / trace.  Avoid: Whipped cream (1 tbs) / 3.5 g.  Choose: Nondairy whipped topping (1 tbs) / 1 g. Condiments / Saturated Fat (g)  Avoid: Mayonnaise (1 tbs) / 2 g.  Choose: Low-fat mayonnaise (1 tbs) / 1 g.  Avoid: Butter (1 tbs) / 7 g.  Choose: Extra light margarine (1 tbs) / 1 g.  Avoid: Coconut oil (1 tbs) / 11.8 g.  Choose: Olive oil (1 tbs) / 1.8 g.  Choose: Corn oil (1 tbs) / 1.7 g.  Choose: Safflower oil (1 tbs) /  1.2 g.  Choose: Sunflower oil (1 tbs) / 1.4 g.  Choose: Soybean oil (1 tbs) / 2.4 g .  Choose: Canola oil (1 tbs) / 1 g. Document Released: 02/17/2012 Document Revised: 04/20/2013 Document Reviewed: 02/17/2012 [facility] Patient Information 2014 [place].

## 2013-09-16 NOTE — Progress Notes (Signed)
Subjective:    Patient ID: Jacqueline Jenkins, female    DOB: Dec 11, 1950, 63 y.o.   MRN: [id number]  HPI Comments: 63 yo female presents for 3 month F/U for Over wt, Anemia, HTN, Cholesterol, Pre-Dm, D. deficient SODIUM 133 T 250 TG 163 L 157  ALT 40 A1C 5.9 D 26  She is trying to eat better. She is decreasing salt/ sugar. She keeps busy but denies cardio. She notes BP is 120s/ 80s. She notes occasional fatigue but resolves with rest. She is trying to lose weight.  Hypertension  Hyperlipidemia   Current Outpatient Prescriptions on File Prior to Visit  Medication Sig Dispense Refill  . aspirin 81 MG tablet Take 81 mg by mouth daily.      . benazepril-hydrochlorthiazide (LOTENSIN HCT) 20-12.5 MG per tablet Take 1 tablet by mouth daily.      . Calcium Carbonate-Vitamin D (CALCIUM + D PO) Take 1 tablet by mouth 2 (two) times daily.      . Ciclesonide (ZETONNA NA) Place into the nose as needed.      . Colesevelam HCl (WELCHOL) 3.75 G PACK Take 3.75 g by mouth 3 (three) times daily.       . Flaxseed, Linseed, (FLAXSEED OIL) 1000 MG CAPS Take 1,000 mg by mouth daily.      . Multiple Vitamin (MULTIVITAMIN) tablet Take 1 tablet by mouth daily.      . Multiple Vitamins-Minerals (MULTIVITAMIN WITH MINERALS) tablet Take 1 tablet by mouth daily.      . Omega-3 Fatty Acids (FISH OIL) 1200 MG CAPS Take by mouth.      . Vitamin D, Ergocalciferol, (DRISDOL) 50000 UNITS CAPS Take 50,000 Units by mouth every 7 (seven) days.       No current facility-administered medications on file prior to visit.   ALLERGIES Lescol; Lipitor; and Vytorin  Past Medical History  Diagnosis Date  . Hypertension   . Hyperlipidemia   . DDD (degenerative disc disease)   . Anemia   . AAA (abdominal aortic aneurysm)   . Splenic artery aneurysm   . Vitamin D deficiency   . Allergic rhinitis, cause unspecified       Review of Systems  Constitutional: Positive for fatigue.  All other systems reviewed and are  negative.   BP 130/84  Pulse 66  Temp(Src) 97.6 F (36.4 C) (Temporal)  Resp 16  Ht 5\' 5"  (1.651 m)  Wt 188 lb (85.276 kg)  BMI 31.28 kg/m2     Objective:   Physical Exam  Nursing note and vitals reviewed. Constitutional: She is oriented to person, place, and time. She appears well-developed and well-nourished. No distress.  Over weight  HENT:  Head: Normocephalic and atraumatic.  Right Ear: External ear normal.  Left Ear: External ear normal.  Nose: Nose normal.  Mouth/Throat: Oropharynx is clear and moist.  Eyes: Conjunctivae and EOM are normal.  Neck: Normal range of motion. Neck supple. No JVD present. No thyromegaly present.  Cardiovascular: Normal rate, regular rhythm, normal heart sounds and intact distal pulses.   Pulmonary/Chest: Effort normal and breath sounds normal.  Abdominal: Soft. Bowel sounds are normal. She exhibits no distension and no mass. There is no tenderness. There is no rebound and no guarding.  Musculoskeletal: Normal range of motion. She exhibits no edema and no tenderness.  Lymphadenopathy:    She has no cervical adenopathy.  Neurological: She is alert and oriented to person, place, and time. No cranial nerve deficit.  Skin: Skin is  warm and dry. No rash noted. No erythema. No pallor.  Psychiatric: She has a normal mood and affect. Her behavior is normal. Judgment and thought content normal.          Assessment & Plan:  1.  3 month F/U for Anemia, HTN, Cholesterol, Pre-Dm, D. Deficient. Needs healthy diet, cardio QD and obtain healthy weight. Check Labs, Check BP if >130/80 call office

## 2013-09-17 LAB — INSULIN, FASTING: Insulin fasting, serum: 37 u[IU]/mL — ABNORMAL HIGH (ref 3–28)

## 2013-10-07 ENCOUNTER — Ambulatory Visit
Admission: RE | Admit: 2013-10-07 | Discharge: 2013-10-07 | Disposition: A | Discharge status: Home / Self Care | Payer: Self-pay | Source: Ambulatory Visit

## 2013-10-07 DIAGNOSIS — Z1231 Encounter for screening mammogram for malignant neoplasm of breast: Secondary | ICD-10-CM

## 2013-10-13 ENCOUNTER — Ambulatory Visit: Payer: Self-pay | Admitting: Physician Assistant

## 2013-11-10 ENCOUNTER — Ambulatory Visit: Payer: Self-pay | Admitting: Physician Assistant

## 2013-11-16 ENCOUNTER — Ambulatory Visit (INDEPENDENT_AMBULATORY_CARE_PROVIDER_SITE_OTHER): Payer: BC Managed Care – PPO | Admitting: Physician Assistant

## 2013-11-16 ENCOUNTER — Encounter: Payer: Self-pay | Admitting: Physician Assistant

## 2013-11-16 VITALS — BP 122/68 | HR 72 | Temp 97.3°F | Resp 16 | Ht 65.0 in | Wt 185.0 lb

## 2013-11-16 DIAGNOSIS — E559 Vitamin D deficiency, unspecified: Secondary | ICD-10-CM

## 2013-11-16 DIAGNOSIS — R7303 Prediabetes: Secondary | ICD-10-CM

## 2013-11-16 DIAGNOSIS — E785 Hyperlipidemia, unspecified: Secondary | ICD-10-CM

## 2013-11-16 DIAGNOSIS — R030 Elevated blood-pressure reading, without diagnosis of hypertension: Secondary | ICD-10-CM

## 2013-11-16 DIAGNOSIS — Z79899 Other long term (current) drug therapy: Secondary | ICD-10-CM

## 2013-11-16 DIAGNOSIS — R7309 Other abnormal glucose: Secondary | ICD-10-CM | POA: Insufficient documentation

## 2013-11-16 LAB — CBC WITH DIFFERENTIAL/PLATELET
BASOS PCT: 2 % — AB (ref 0–1)
Basophils Absolute: 0.1 10*3/uL (ref 0.0–0.1)
EOS PCT: 4 % (ref 0–5)
Eosinophils Absolute: 0.2 10*3/uL (ref 0.0–0.7)
HCT: 42.9 % (ref 36.0–46.0)
Hemoglobin: 14.5 g/dL (ref 12.0–15.0)
LYMPHS ABS: 1.4 10*3/uL (ref 0.7–4.0)
Lymphocytes Relative: 23 % (ref 12–46)
MCH: 30.3 pg (ref 26.0–34.0)
MCHC: 33.8 g/dL (ref 30.0–36.0)
MCV: 89.6 fL (ref 78.0–100.0)
Monocytes Absolute: 0.4 10*3/uL (ref 0.1–1.0)
Monocytes Relative: 7 % (ref 3–12)
Neutro Abs: 3.9 10*3/uL (ref 1.7–7.7)
Neutrophils Relative %: 64 % (ref 43–77)
PLATELETS: 342 10*3/uL (ref 150–400)
RBC: 4.79 MIL/uL (ref 3.87–5.11)
RDW: 13.5 % (ref 11.5–15.5)
WBC: 6.1 10*3/uL (ref 4.0–10.5)

## 2013-11-16 LAB — HEMOGLOBIN A1C
HEMOGLOBIN A1C: 5.9 % — AB (ref <5.7)
MEAN PLASMA GLUCOSE: 123 mg/dL — AB (ref <117)

## 2013-11-16 MED ORDER — BENAZEPRIL-HYDROCHLOROTHIAZIDE 20-12.5 MG PO TABS: 1.0000 | ORAL_TABLET | Freq: Every day | ORAL | Status: DC

## 2013-11-16 MED ORDER — COLESEVELAM HCL 625 MG PO TABS: 1875.0000 mg | ORAL_TABLET | Freq: Every day | ORAL | Status: DC

## 2013-11-16 NOTE — Patient Instructions (Signed)
Your LDL is not in range. Your LDL is the bad cholesterol that can lead to heart attack and stroke. To lower your number you can decrease your fatty foods, red meat, cheese, milk and increase fiber like whole grains and veggies. You can also add a fiber supplement like  Benefiber.     Bad carbs also include fruit juice, alcohol, and sweet tea. These are empty calories that do not signal to your brain that you are full.   Please remember the good carbs are still carbs which convert into sugar. So please measure them out no more than 1/2-1 cup of rice, oatmeal, pasta, and beans.  Veggies are however free foods! Pile them on.   I like lean protein at every meal such as chicken, [place], pork chops, cottage cheese, etc. Just do not fry these meats and please center your meal around vegetable, the meats should be a side dish.   No all fruit is created equal. Please see the list below, the fruit at the bottom is higher in sugars than the fruit at the top   What is the TMJ? The temporomandibular (tem-PUH-ro-man-DIB-yoo-ler) joint, or the TMJ, connects the upper and lower jawbones. This joint allows the jaw to open wide and move back and forth when you chew, talk, or yawn.There are also several muscles that help this joint move. There can be muscle tightness and pain in the muscle that can cause several symptoms.  What causes TMJ pain? There are many causes of TMJ pain. Repeated chewing (for example, chewing gum) and clenching your teeth can cause pain in the joint. Some TMJ pain has no obvious cause. What can I do to ease the pain? There are many things you can do to help your pain get better. When you have pain:  Eat soft foods and stay away from chewy foods (for example, taffy) Try to use both sides of your mouth to chew Don't chew gum Don't open your mouth wide (for example, during yawning or singing) Don't bite your cheeks or fingernails Lower your amount of stress and worry Applying a warm,  damp washcloth to the joint may help. Over-the-counter pain medicines such as ibuprofen (one brand: Advil) or acetaminophen (one brand: Tylenol) might also help. Do not use these medicines if you are allergic to them or if your doctor told you not to use them. How can I stop the pain from coming back? When your pain is better, you can do these exercises to make your muscles stronger and to keep the pain from coming back:  Resisted mouth opening: Place your thumb or two fingers under your chin and open your mouth slowly, pushing up lightly on your chin with your thumb. Hold for three to six seconds. Close your mouth slowly. Resisted mouth closing: Place your thumbs under your chin and your two index fingers on the ridge between your mouth and the bottom of your chin. Push down lightly on your chin as you close your mouth. Tongue up: Slowly open and close your mouth while keeping the tongue touching the roof of the mouth. Side-to-side jaw movement: Place an object about one fourth of an inch thick (for example, two tongue depressors) between your front teeth. Slowly move your jaw from side to side. Increase the thickness of the object as the exercise becomes easier Forward jaw movement: Place an object about one fourth of an inch thick between your front teeth and move the bottom jaw forward so that the bottom teeth  are in front of the top teeth. Increase the thickness of the object as the exercise becomes easier. These exercises should not be painful. If it hurts to do these exercises, stop doing them and talk to your family doctor.

## 2013-11-16 NOTE — Progress Notes (Signed)
HPI 63 y.o. female  presents for 3 month follow up with hypertension, hyperlipidemia, prediabetes and vitamin D. Her blood pressure has been controlled at home, today their BP is BP: 122/68 mmHg She does not workout. She denies chest pain, shortness of breath, dizziness.  She is on cholesterol medication and denies myalgias, she intolerant to Statins and zetia. Her cholesterol is not at goal. The cholesterol last visit was:   Lab Results  Component Value Date   CHOL 232* 09/16/2013   HDL 56 09/16/2013   LDLCALC 145* 09/16/2013   TRIG 156* 09/16/2013   CHOLHDL 4.1 09/16/2013   She has been working on diet and exercise for prediabetes, and denies hyperglycemia, nausea, paresthesia of the feet, polydipsia and polyuria. Last A1C in the office was:  Lab Results  Component Value Date   HGBA1C 6.0* 09/16/2013   Patient is on Vitamin D supplement.   Wakes up in the morning with headache/earache bilateral, and feels sinus pressure. No congestion, no fever, chills. Uses saline. + TMJ  Current Medications:  Current Outpatient Prescriptions on File Prior to Visit  Medication Sig Dispense Refill  . aspirin 81 MG tablet Take 81 mg by mouth daily.      . benazepril-hydrochlorthiazide (LOTENSIN HCT) 20-12.5 MG per tablet Take 1 tablet by mouth daily.      . Calcium Carbonate-Vitamin D (CALCIUM + D PO) Take 1 tablet by mouth 2 (two) times daily.      . Ciclesonide (ZETONNA NA) Place into the nose as needed.      . Colesevelam HCl (WELCHOL) 3.75 G PACK Take 3.75 g by mouth 3 (three) times daily.       . Flaxseed, Linseed, (FLAXSEED OIL) 1000 MG CAPS Take 1,000 mg by mouth daily.      . Multiple Vitamin (MULTIVITAMIN) tablet Take 1 tablet by mouth daily.      . Multiple Vitamins-Minerals (MULTIVITAMIN WITH MINERALS) tablet Take 1 tablet by mouth daily.      . Omega-3 Fatty Acids (FISH OIL) 1200 MG CAPS Take by mouth.       No current facility-administered medications on file prior to visit.   Medical  History:  Past Medical History  Diagnosis Date  . Hypertension   . Hyperlipidemia   . DDD (degenerative disc disease)   . Anemia   . AAA (abdominal aortic aneurysm)   . Splenic artery aneurysm   . Vitamin D deficiency   . Allergic rhinitis, cause unspecified   . Osteopenia 12/2011    Spine T -0.4, Femur -2.1   Allergies:  Allergies  Allergen Reactions  . Lescol [Fluvastatin Sodium]     Myalgia  . Lipitor [Atorvastatin]     Increased LFT's  . Vytorin [Ezetimibe-Simvastatin]     myalgia     Review of Systems: [X]  = complains of  [ ]  = denies  General: Fatigue [ ]  Fever [ ]  Chills [ ]  Weakness [ ]   Insomnia [ ]  Eyes: Redness [ ]  Blurred vision [ ]  Diplopia [ ]   ENT: Congestion [ ]  Sinus Pain [ X] Post Nasal Drip [ ]  Sore Throat [ ]  Earache [ ]   Cardiac: Chest pain/pressure [ ]  SOB [ ]  Orthopnea [ ]   Palpitations [ ]   Paroxysmal nocturnal dyspnea[ ]  Claudication [ ]  Edema [ ]   Pulmonary: Cough [ ]  Wheezing[ ]   SOB [ ]   Snoring [ ]   GI: Nausea [ ]  Vomiting[ ]  Dysphagia[ ]  Heartburn[ ]  Abdominal pain [ ]  Constipation [ ] ;  Diarrhea [ ] ; BRBPR [ ]  Melena[ ]  GU: Hematuria[ ]  Dysuria [ ]  Nocturia[ ]  Urgency [ ]   Hesitancy [ ]  Discharge [ ]  Neuro: Headaches[ X] Vertigo[ ]  Paresthesias[ ]  Spasm [ ]  Speech changes [ ]  Incoordination [ ]   Ortho: Arthritis [ ]  Joint pain [ ]  Muscle pain [ ]  Joint swelling [ ]  Back Pain [ ]  Skin:  Rash [ ]   Pruritis [ ]  Change in skin lesion [ ]   Psych: Depression[ ]  Anxiety[ ]  Confusion [ ]  Memory loss [ ]   Heme/Lypmh: Bleeding [ ]  Bruising [ ]  Enlarged lymph nodes [ ]   Endocrine: Visual blurring [ ]  Paresthesia [ ]  Polyuria [ ]  Polydypsea [ ]    Heat/cold intolerance [ ]  Hypoglycemia [ ]   Family history- Review and unchanged Social history- Review and unchanged Physical Exam: BP 122/68  Pulse 72  Temp(Src) 97.3 F (36.3 C)  Resp 16  Ht 5\' 5"  (1.651 m)  Wt 185 lb (83.915 kg)  BMI 30.79 kg/m2 Wt Readings from Last 3 Encounters:  11/16/13 185 lb  (83.915 kg)  09/16/13 188 lb (85.276 kg)  03/28/13 185 lb (83.915 kg)   General Appearance: Well nourished, in no apparent distress. Eyes: PERRLA, EOMs, conjunctiva no swelling or erythema Sinuses: No Frontal/maxillary tenderness ENT/Mouth: Ext aud canals clear, TMs without erythema, bulging. No erythema, swelling, or exudate on post pharynx.  Tonsils not swollen or erythematous. Hearing normal.  Neck: Supple, thyroid normal, + TMJ Respiratory: Respiratory effort normal, BS equal bilaterally without rales, rhonchi, wheezing or stridor.  Cardio: RRR with no MRGs. Brisk peripheral pulses without edema.  Abdomen: Soft, + BS.  Non tender, no guarding, rebound, hernias, masses. Lymphatics: Non tender without lymphadenopathy.  Musculoskeletal: Full ROM, 5/5 strength, normal gait.  Skin: Warm, dry without rashes, lesions, ecchymosis.  Neuro: Cranial nerves intact. Normal muscle tone, no cerebellar symptoms. Sensation intact.  Psych: Awake and oriented X 3, normal affect, Insight and Judgment appropriate.   Assessment and Plan:  Hypertension: Continue medication, monitor blood pressure at home. Continue DASH diet. Cholesterol: Continue diet and exercise. Check cholesterol.  Pre-diabetes-Continue diet and exercise. Check A1C Vitamin D Def- check level and continue medications.  TMJ-information given to the patient, no gum/decrease hard foods, warm wet wash clothes, decrease stress, talk with dentist about possible night guard, can do massage, and exercise.   Continue diet and meds as discussed. Further disposition pending results of labs.  Vicie Mutters 11:15 AM

## 2013-11-17 LAB — HEPATIC FUNCTION PANEL
ALT: 28 U/L (ref 0–35)
AST: 23 U/L (ref 0–37)
Albumin: 4.5 g/dL (ref 3.5–5.2)
Alkaline Phosphatase: 61 U/L (ref 39–117)
BILIRUBIN TOTAL: 0.6 mg/dL (ref 0.2–1.2)
Bilirubin, Direct: 0.1 mg/dL (ref 0.0–0.3)
Indirect Bilirubin: 0.5 mg/dL (ref 0.2–1.2)
Total Protein: 7.3 g/dL (ref 6.0–8.3)

## 2013-11-17 LAB — LIPID PANEL
CHOLESTEROL: 235 mg/dL — AB (ref 0–200)
HDL: 57 mg/dL (ref >39)
LDL Cholesterol: 138 mg/dL — ABNORMAL HIGH (ref 0–99)
Total CHOL/HDL Ratio: 4.1 Ratio
Triglycerides: 202 mg/dL — ABNORMAL HIGH (ref <150)
VLDL: 40 mg/dL (ref 0–40)

## 2013-11-17 LAB — TSH: TSH: 1.028 u[IU]/mL (ref 0.350–4.500)

## 2013-11-17 LAB — BASIC METABOLIC PANEL WITH GFR
BUN: 13 mg/dL (ref 6–23)
CALCIUM: 9.7 mg/dL (ref 8.4–10.5)
CO2: 26 meq/L (ref 19–32)
Chloride: 100 mEq/L (ref 96–112)
Creat: 0.78 mg/dL (ref 0.50–1.10)
GFR, Est African American: >89 mL/min
GFR, Est Non African American: 81 mL/min
GLUCOSE: 93 mg/dL (ref 70–99)
Potassium: 4.6 mEq/L (ref 3.5–5.3)
Sodium: 135 mEq/L (ref 135–145)

## 2013-11-17 LAB — MAGNESIUM: MAGNESIUM: 1.9 mg/dL (ref 1.5–2.5)

## 2013-11-17 LAB — INSULIN, FASTING: Insulin fasting, serum: 30 u[IU]/mL — ABNORMAL HIGH (ref 3–28)

## 2013-11-17 LAB — VITAMIN D 25 HYDROXY (VIT D DEFICIENCY, FRACTURES): VIT D 25 HYDROXY: 36 ng/mL (ref 30–89)

## 2014-01-18 ENCOUNTER — Encounter: Payer: Self-pay | Admitting: Emergency Medicine

## 2014-01-18 ENCOUNTER — Ambulatory Visit (INDEPENDENT_AMBULATORY_CARE_PROVIDER_SITE_OTHER): Payer: BC Managed Care – PPO | Admitting: Emergency Medicine

## 2014-01-18 VITALS — BP 116/74 | HR 72 | Temp 98.2°F | Resp 16 | Ht 65.0 in | Wt 184.0 lb

## 2014-01-18 DIAGNOSIS — Z1212 Encounter for screening for malignant neoplasm of rectum: Secondary | ICD-10-CM

## 2014-01-18 DIAGNOSIS — Z0000 Encounter for general adult medical examination without abnormal findings: Secondary | ICD-10-CM

## 2014-01-18 DIAGNOSIS — R350 Frequency of micturition: Secondary | ICD-10-CM

## 2014-01-18 DIAGNOSIS — E559 Vitamin D deficiency, unspecified: Secondary | ICD-10-CM

## 2014-01-18 DIAGNOSIS — Z79899 Other long term (current) drug therapy: Secondary | ICD-10-CM

## 2014-01-18 DIAGNOSIS — I10 Essential (primary) hypertension: Secondary | ICD-10-CM

## 2014-01-18 DIAGNOSIS — Z111 Encounter for screening for respiratory tuberculosis: Secondary | ICD-10-CM

## 2014-01-18 LAB — URINALYSIS, ROUTINE W REFLEX MICROSCOPIC
Bilirubin Urine: NEGATIVE
Glucose, UA: NEGATIVE mg/dL
Hgb urine dipstick: NEGATIVE
Ketones, ur: NEGATIVE mg/dL
NITRITE: NEGATIVE
PROTEIN: NEGATIVE mg/dL
Specific Gravity, Urine: 1.015 (ref 1.005–1.030)
UROBILINOGEN UA: 0.2 mg/dL (ref 0.0–1.0)
pH: 6.5 (ref 5.0–8.0)

## 2014-01-18 LAB — CBC WITH DIFFERENTIAL/PLATELET
BASOS ABS: 0.1 10*3/uL (ref 0.0–0.1)
Basophils Relative: 1 % (ref 0–1)
EOS PCT: 6 % — AB (ref 0–5)
Eosinophils Absolute: 0.4 10*3/uL (ref 0.0–0.7)
HEMATOCRIT: 42.9 % (ref 36.0–46.0)
Hemoglobin: 14.2 g/dL (ref 12.0–15.0)
Lymphocytes Relative: 22 % (ref 12–46)
Lymphs Abs: 1.5 10*3/uL (ref 0.7–4.0)
MCH: 29.8 pg (ref 26.0–34.0)
MCHC: 33.1 g/dL (ref 30.0–36.0)
MCV: 90.1 fL (ref 78.0–100.0)
MONO ABS: 0.5 10*3/uL (ref 0.1–1.0)
MONOS PCT: 7 % (ref 3–12)
Neutro Abs: 4.4 10*3/uL (ref 1.7–7.7)
Neutrophils Relative %: 64 % (ref 43–77)
Platelets: 315 10*3/uL (ref 150–400)
RBC: 4.76 MIL/uL (ref 3.87–5.11)
RDW: 13.8 % (ref 11.5–15.5)
WBC: 6.9 10*3/uL (ref 4.0–10.5)

## 2014-01-18 MED ORDER — AZITHROMYCIN 250 MG PO TABS
ORAL_TABLET | ORAL | Status: AC
Start: 2014-01-18 — End: 2014-01-23

## 2014-01-18 NOTE — Patient Instructions (Signed)
Allergic Rhinitis Allergic rhinitis is when the mucous membranes in the nose respond to allergens. Allergens are particles in the air that cause your body to have an allergic reaction. This causes you to release allergic antibodies. Through a chain of events, these eventually cause you to release histamine into the blood stream. Although meant to protect the body, it is this release of histamine that causes your discomfort, such as frequent sneezing, congestion, and an itchy, runny nose.  CAUSES  Seasonal allergic rhinitis (hay fever) is caused by pollen allergens that may come from grasses, trees, and weeds. Year-round allergic rhinitis (perennial allergic rhinitis) is caused by allergens such as house dust mites, pet dander, and mold spores.  SYMPTOMS   Nasal stuffiness (congestion).  Itchy, runny nose with sneezing and tearing of the eyes. DIAGNOSIS  Your health care provider can help you determine the allergen or allergens that trigger your symptoms. If you and your health care provider are unable to determine the allergen, skin or blood testing may be used. TREATMENT  Allergic Rhinitis does not have a cure, but it can be controlled by:  Medicines and allergy shots (immunotherapy).  Avoiding the allergen. Hay fever may often be treated with antihistamines in pill or nasal spray forms. Antihistamines block the effects of histamine. There are over-the-counter medicines that may help with nasal congestion and swelling around the eyes. Check with your health care provider before taking or giving this medicine.  If avoiding the allergen or the medicine prescribed do not work, there are many new medicines your health care provider can prescribe. Stronger medicine may be used if initial measures are ineffective. Desensitizing injections can be used if medicine and avoidance does not work. Desensitization is when a patient is given ongoing shots until the body becomes less sensitive to the allergen.  Make sure you follow up with your health care provider if problems continue. HOME CARE INSTRUCTIONS It is not possible to completely avoid allergens, but you can reduce your symptoms by taking steps to limit your exposure to them. It helps to know exactly what you are allergic to so that you can avoid your specific triggers. SEEK MEDICAL CARE IF:   You have a fever.  You develop a cough that does not stop easily (persistent).  You have shortness of breath.  You start wheezing.  Symptoms interfere with normal daily activities. Document Released: 05/13/2001 Document Revised: 06/08/2013 Document Reviewed: 04/25/2013 [employer] Patient Information 2014 [employer].  

## 2014-01-18 NOTE — Progress Notes (Signed)
Subjective:    Patient ID: Jacqueline Jenkins, female    DOB: May 15, 1951, 63 y.o.   MRN: [id number]  HPI Comments: She has increased vit D since last OV. She is walking more. She notes BP  She notes mild increase sinus drainage and ear pressure on the left.   Hyperlipidemia  Hypertension     Medication List       This list is accurate as of: 01/18/14 11:59 PM.  Always use your most recent med list.               aspirin 81 MG tablet  Take 81 mg by mouth daily.     azithromycin 250 MG tablet  Commonly known as:  ZITHROMAX  Take 2 tablets (500 mg) on  Day 1,  followed by 1 tablet (250 mg) once daily on Days 2 through 5.     benazepril-hydrochlorthiazide 20-12.5 MG per tablet  Commonly known as:  LOTENSIN HCT  Take 1 tablet by mouth daily.     CALCIUM + D PO  Take 1 tablet by mouth 2 (two) times daily.     colesevelam 625 MG tablet  Commonly known as:  WELCHOL  Take 3 tablets (1,875 mg total) by mouth daily with breakfast.     Fish Oil 1200 MG Caps  Take by mouth.     Flaxseed Oil 1000 MG Caps  Take 1,000 mg by mouth daily.     multivitamin tablet  Take 1 tablet by mouth daily.     [place] NA  Place into the nose as needed.        Allergies  Allergen Reactions  . Lescol [Fluvastatin Sodium]     Myalgia  . Lipitor [Atorvastatin]     Increased LFT's  . Vytorin [Ezetimibe-Simvastatin]     myalgia   Past Medical History  Diagnosis Date  . Hypertension   . Hyperlipidemia   . DDD (degenerative disc disease)   . Anemia   . AAA (abdominal aortic aneurysm)   . Splenic artery aneurysm   . Vitamin D deficiency   . Allergic rhinitis, cause unspecified   . Osteopenia 12/2011    Spine T -0.4, Femur -2.1   Past Surgical History  Procedure Laterality Date  . Tonsillectomy    . Spine surgery  1998    cervical diskectomy  . Spine surgery  2001, 2010    Lumbar diskectomy  . Cervical disc surgery  1998  . Lumbar disc surgery  2001, 2010  . Splenic aneurysm   02/10/2012    Aneurysm of splenic artery  . Colonoscopy  Jan. 7, 2014   History  Substance Use Topics  . Smoking status: Never Smoker   . Smokeless tobacco: Never Used  . Alcohol Use: Yes     Comment: 3-4 times per year   Family History  Problem Relation Age of Onset  . Stroke Mother   . Osteoporosis Mother   . Hyperlipidemia Mother   . Hypertension Mother   . Other Mother     varicose veins  . Deep vein thrombosis Mother   . Cancer Maternal Grandmother 70    Breast cancer  . Other Father     bleeding problems  . Hypertension Father    Patient Care Team: Unk Pinto, MD as PCP - General (Internal Medicine) Jeanell Sparrow, MD as Consulting Physician (Ophthalmology) Serafina Mitchell, MD as Consulting Physician (Vascular Surgery) Beryle Beams, MD as Consulting Physician (Gastroenterology) Amy Y Martinique, MD  as Consulting Physician (Dermatology)  MAINTENANCE: Colonoscopy:2014 WNL Mammo: 10/17/13 WNL BMD: 12/23/11 osteopenia at GYN Pap/ Pelvic:2012 WNL EYE: Q 2 years Dentist:1/ 15 Q 6 month  IMMUNIZATIONS: Td: 2005 declined update til next visit Pneumovax:2013 Zostavax:n/a Influenza:2014 Meningoc: 2013    Review of Systems BP 116/74  Pulse 72  Temp(Src) 98.2 F (36.8 C) (Temporal)  Resp 16  Ht 5\' 5"  (1.651 m)  Wt 184 lb (83.462 kg)  BMI 30.62 kg/m2     Objective:   Physical Exam  Nursing note and vitals reviewed. Constitutional: She is oriented to person, place, and time. She appears well-developed and well-nourished. No distress.  Overweight  HENT:  Head: Normocephalic and atraumatic.  Right Ear: External ear normal.  Left Ear: External ear normal.  Nose: Nose normal.  Mouth/Throat: Oropharynx is clear and moist.  Cloudy TM's bilaterally   Eyes: Conjunctivae and EOM are normal. Pupils are equal, round, and reactive to light. Right eye exhibits no discharge. Left eye exhibits no discharge. No scleral icterus.  Neck: Normal range of motion. Neck  supple. No JVD present. No tracheal deviation present. No thyromegaly present.  Cardiovascular: Normal rate, regular rhythm, normal heart sounds and intact distal pulses.   Pulmonary/Chest: Effort normal and breath sounds normal.  Abdominal: Soft. Bowel sounds are normal. She exhibits no distension and no mass. There is no tenderness. There is no rebound and no guarding.  Genitourinary:  Def GYN  Musculoskeletal: Normal range of motion. She exhibits no edema and no tenderness.  Lymphadenopathy:    She has no cervical adenopathy.  Neurological: She is alert and oriented to person, place, and time. She has normal reflexes. No cranial nerve deficit. She exhibits normal muscle tone. Coordination normal.  Skin: Skin is warm and dry. No rash noted. No erythema. No pallor.  Mid back/ left shoulder mld increase in pigment 3-4 mm flat  Psychiatric: She has a normal mood and affect. Her behavior is normal. Judgment and thought content normal.     AORTA SCAN WNL EKG NSCSPT      Assessment & Plan:  1. CPE- Update screening labs/ History/ Immunizations/ Testing as needed. Advised healthy diet, QD exercise, increase H20 and continue RX/ Vitamins AD.  2. Allergic rhinitis- increase H2o, allergy hygiene explained. If SX increase start ZPAK, Switch to Claritin and start NS AD. Dymista SX given #1  3. Frequency- Check labs, increase H20, bland diet   4.  Irreg Nevi- monitor for any change, call if occurs for removal

## 2014-01-19 LAB — BASIC METABOLIC PANEL WITH GFR
BUN: 18 mg/dL (ref 6–23)
CALCIUM: 9.6 mg/dL (ref 8.4–10.5)
CO2: 23 meq/L (ref 19–32)
CREATININE: 0.79 mg/dL (ref 0.50–1.10)
Chloride: 100 mEq/L (ref 96–112)
GFR, Est African American: >89 mL/min
GFR, Est Non African American: 79 mL/min
Glucose, Bld: 91 mg/dL (ref 70–99)
Potassium: 4.2 mEq/L (ref 3.5–5.3)
Sodium: 135 mEq/L (ref 135–145)

## 2014-01-19 LAB — HEPATIC FUNCTION PANEL
ALBUMIN: 4.5 g/dL (ref 3.5–5.2)
ALT: 30 U/L (ref 0–35)
AST: 24 U/L (ref 0–37)
Alkaline Phosphatase: 60 U/L (ref 39–117)
Bilirubin, Direct: 0.1 mg/dL (ref 0.0–0.3)
Indirect Bilirubin: 0.4 mg/dL (ref 0.2–1.2)
TOTAL PROTEIN: 7.2 g/dL (ref 6.0–8.3)
Total Bilirubin: 0.5 mg/dL (ref 0.2–1.2)

## 2014-01-19 LAB — URINALYSIS, MICROSCOPIC ONLY
BACTERIA UA: NONE SEEN
CASTS: NONE SEEN
CRYSTALS: NONE SEEN

## 2014-01-19 LAB — MICROALBUMIN / CREATININE URINE RATIO
Creatinine, Urine: 73.1 mg/dL
MICROALB UR: 0.77 mg/dL (ref 0.00–1.89)
Microalb Creat Ratio: 10.5 mg/g (ref 0.0–30.0)

## 2014-01-19 LAB — INSULIN, FASTING: Insulin fasting, serum: 39 u[IU]/mL — ABNORMAL HIGH (ref 3–28)

## 2014-01-19 LAB — LIPID PANEL
Cholesterol: 247 mg/dL — ABNORMAL HIGH (ref 0–200)
HDL: 59 mg/dL (ref >39)
LDL Cholesterol: 147 mg/dL — ABNORMAL HIGH (ref 0–99)
TRIGLYCERIDES: 204 mg/dL — AB (ref <150)
Total CHOL/HDL Ratio: 4.2 Ratio
VLDL: 41 mg/dL — ABNORMAL HIGH (ref 0–40)

## 2014-01-19 LAB — MAGNESIUM: MAGNESIUM: 1.9 mg/dL (ref 1.5–2.5)

## 2014-01-19 LAB — VITAMIN D 25 HYDROXY (VIT D DEFICIENCY, FRACTURES): Vit D, 25-Hydroxy: 34 ng/mL (ref 30–89)

## 2014-01-19 LAB — TSH: TSH: 1.223 u[IU]/mL (ref 0.350–4.500)

## 2014-01-20 ENCOUNTER — Other Ambulatory Visit: Payer: Self-pay | Admitting: Emergency Medicine

## 2014-01-20 LAB — TB SKIN TEST
INDURATION: 0 mm
TB SKIN TEST: NEGATIVE

## 2014-01-20 LAB — URINE CULTURE: Colony Count: 30000

## 2014-01-20 MED ORDER — AMOXICILLIN 500 MG PO CAPS: 500.0000 mg | ORAL_CAPSULE | Freq: Three times a day (TID) | ORAL | Status: DC

## 2014-01-30 ENCOUNTER — Other Ambulatory Visit (INDEPENDENT_AMBULATORY_CARE_PROVIDER_SITE_OTHER): Payer: BC Managed Care – PPO

## 2014-01-30 DIAGNOSIS — Z0000 Encounter for general adult medical examination without abnormal findings: Secondary | ICD-10-CM

## 2014-01-30 DIAGNOSIS — Z1212 Encounter for screening for malignant neoplasm of rectum: Secondary | ICD-10-CM

## 2014-01-30 LAB — POC HEMOCCULT BLD/STL (HOME/3-CARD/SCREEN)
Card #2 Fecal Occult Blod, POC: NEGATIVE
Card #3 Fecal Occult Blood, POC: NEGATIVE
Fecal Occult Blood, POC: NEGATIVE

## 2014-02-23 ENCOUNTER — Encounter: Payer: Self-pay | Admitting: Emergency Medicine

## 2014-02-23 ENCOUNTER — Ambulatory Visit (INDEPENDENT_AMBULATORY_CARE_PROVIDER_SITE_OTHER): Payer: BC Managed Care – PPO | Admitting: Emergency Medicine

## 2014-02-23 VITALS — BP 110/62 | HR 74 | Temp 98.0°F | Resp 16 | Ht 65.0 in | Wt 191.0 lb

## 2014-02-23 DIAGNOSIS — N390 Urinary tract infection, site not specified: Secondary | ICD-10-CM

## 2014-02-23 NOTE — Patient Instructions (Signed)

## 2014-02-23 NOTE — Progress Notes (Signed)
   Subjective:    Patient ID: Jacqueline Jenkins, female    DOB: 1950-12-05, 63 y.o.   MRN: [id number]  HPI Comments: 63 yo WF f/u UTI recent ABX completed. She denies symptoms previously or currently.     Medication List       This list is accurate as of: 02/23/14 10:39 AM.  Always use your most recent med list.               aspirin 81 MG tablet  Take 81 mg by mouth daily.     benazepril-hydrochlorthiazide 20-12.5 MG per tablet  Commonly known as:  LOTENSIN HCT  Take 1 tablet by mouth daily.     CALCIUM + D PO  Take 1 tablet by mouth 2 (two) times daily.     colesevelam 625 MG tablet  Commonly known as:  WELCHOL  Take 3 tablets (1,875 mg total) by mouth daily with breakfast.     DYMISTA NA  Place into the nose as needed.     Fish Oil 1200 MG Caps  Take by mouth.     Flaxseed Oil 1000 MG Caps  Take 1,000 mg by mouth daily.     multivitamin tablet  Take 1 tablet by mouth daily.     [place] NA  Place into the nose as needed.       Allergies  Allergen Reactions  . Lescol [Fluvastatin Sodium]     Myalgia  . Lipitor [Atorvastatin]     Increased LFT's  . Vytorin [Ezetimibe-Simvastatin]     myalgia    Past Medical History  Diagnosis Date  . Hypertension   . Hyperlipidemia   . DDD (degenerative disc disease)   . Anemia   . AAA (abdominal aortic aneurysm)   . Splenic artery aneurysm   . Vitamin D deficiency   . Allergic rhinitis, cause unspecified   . Osteopenia 12/2011    Spine T -0.4, Femur -2.1      Review of Systems  All other systems reviewed and are negative.  BP 110/62  Pulse 74  Temp(Src) 98 F (36.7 C) (Temporal)  Resp 16  Ht 5\' 5"  (1.651 m)  Wt 191 lb (86.637 kg)  BMI 31.78 kg/m2     Objective:   Physical Exam  Nursing note and vitals reviewed. Constitutional: She is oriented to person, place, and time. She appears well-developed and well-nourished.  HENT:  Head: Normocephalic and atraumatic.  Eyes: Conjunctivae are normal.  Neck:  Normal range of motion.  Cardiovascular: Normal rate, regular rhythm, normal heart sounds and intact distal pulses.   Pulmonary/Chest: Effort normal and breath sounds normal.  Abdominal: Soft. Bowel sounds are normal. She exhibits no distension and no mass. There is no tenderness. There is no rebound and no guarding.  Musculoskeletal: Normal range of motion.  Neurological: She is alert and oriented to person, place, and time.  Skin: Skin is warm and dry.  Psychiatric: She has a normal mood and affect. Judgment normal.          Assessment & Plan:  1.  UTI recent- Recheck labs, push H2O Hygiene explained

## 2014-02-24 LAB — URINALYSIS, MICROSCOPIC ONLY
Bacteria, UA: NONE SEEN
CASTS: NONE SEEN
CRYSTALS: NONE SEEN
Squamous Epithelial / LPF: NONE SEEN

## 2014-02-24 LAB — URINALYSIS, ROUTINE W REFLEX MICROSCOPIC
Bilirubin Urine: NEGATIVE
Glucose, UA: NEGATIVE mg/dL
Hgb urine dipstick: NEGATIVE
Ketones, ur: NEGATIVE mg/dL
NITRITE: NEGATIVE
PH: 6.5 (ref 5.0–8.0)
Protein, ur: NEGATIVE mg/dL
SPECIFIC GRAVITY, URINE: 1.009 (ref 1.005–1.030)
UROBILINOGEN UA: 0.2 mg/dL (ref 0.0–1.0)

## 2014-02-24 LAB — URINE CULTURE
Colony Count: NO GROWTH
ORGANISM ID, BACTERIA: NO GROWTH

## 2014-03-31 ENCOUNTER — Other Ambulatory Visit: Payer: Self-pay | Admitting: Emergency Medicine

## 2014-05-11 DIAGNOSIS — H25019 Cortical age-related cataract, unspecified eye: Secondary | ICD-10-CM | POA: Diagnosis not present

## 2014-05-19 ENCOUNTER — Other Ambulatory Visit: Payer: Self-pay | Admitting: Physician Assistant

## 2014-05-31 DIAGNOSIS — L570 Actinic keratosis: Secondary | ICD-10-CM | POA: Diagnosis not present

## 2014-05-31 DIAGNOSIS — L821 Other seborrheic keratosis: Secondary | ICD-10-CM | POA: Diagnosis not present

## 2014-05-31 DIAGNOSIS — L820 Inflamed seborrheic keratosis: Secondary | ICD-10-CM | POA: Diagnosis not present

## 2014-06-02 ENCOUNTER — Ambulatory Visit: Payer: Self-pay | Admitting: Physician Assistant

## 2014-06-08 DIAGNOSIS — Z23 Encounter for immunization: Secondary | ICD-10-CM | POA: Diagnosis not present

## 2014-06-13 DIAGNOSIS — Z124 Encounter for screening for malignant neoplasm of cervix: Secondary | ICD-10-CM | POA: Diagnosis not present

## 2014-06-13 DIAGNOSIS — Z01419 Encounter for gynecological examination (general) (routine) without abnormal findings: Secondary | ICD-10-CM | POA: Diagnosis not present

## 2014-06-27 ENCOUNTER — Ambulatory Visit (INDEPENDENT_AMBULATORY_CARE_PROVIDER_SITE_OTHER): Payer: Medicare Other | Admitting: Physician Assistant

## 2014-06-27 ENCOUNTER — Encounter: Payer: Self-pay | Admitting: Physician Assistant

## 2014-06-27 VITALS — BP 132/72 | HR 76 | Temp 97.9°F | Resp 16 | Ht 65.0 in | Wt 190.0 lb

## 2014-06-27 DIAGNOSIS — R030 Elevated blood-pressure reading, without diagnosis of hypertension: Secondary | ICD-10-CM

## 2014-06-27 DIAGNOSIS — Z79899 Other long term (current) drug therapy: Secondary | ICD-10-CM

## 2014-06-27 DIAGNOSIS — R7303 Prediabetes: Secondary | ICD-10-CM

## 2014-06-27 DIAGNOSIS — E559 Vitamin D deficiency, unspecified: Secondary | ICD-10-CM | POA: Diagnosis not present

## 2014-06-27 DIAGNOSIS — R7309 Other abnormal glucose: Secondary | ICD-10-CM | POA: Diagnosis not present

## 2014-06-27 DIAGNOSIS — E785 Hyperlipidemia, unspecified: Secondary | ICD-10-CM

## 2014-06-27 LAB — CBC WITH DIFFERENTIAL/PLATELET
BASOS ABS: 0.1 10*3/uL (ref 0.0–0.1)
Basophils Relative: 2 % — ABNORMAL HIGH (ref 0–1)
EOS PCT: 5 % (ref 0–5)
Eosinophils Absolute: 0.3 10*3/uL (ref 0.0–0.7)
HEMATOCRIT: 42 % (ref 36.0–46.0)
HEMOGLOBIN: 14.1 g/dL (ref 12.0–15.0)
LYMPHS PCT: 26 % (ref 12–46)
Lymphs Abs: 1.4 10*3/uL (ref 0.7–4.0)
MCH: 30.1 pg (ref 26.0–34.0)
MCHC: 33.6 g/dL (ref 30.0–36.0)
MCV: 89.6 fL (ref 78.0–100.0)
MONO ABS: 0.5 10*3/uL (ref 0.1–1.0)
MONOS PCT: 9 % (ref 3–12)
NEUTROS ABS: 3 10*3/uL (ref 1.7–7.7)
Neutrophils Relative %: 58 % (ref 43–77)
Platelets: 283 10*3/uL (ref 150–400)
RBC: 4.69 MIL/uL (ref 3.87–5.11)
RDW: 13.6 % (ref 11.5–15.5)
WBC: 5.2 10*3/uL (ref 4.0–10.5)

## 2014-06-27 NOTE — Patient Instructions (Signed)

## 2014-06-27 NOTE — Progress Notes (Signed)
Assessment and Plan:  Hypertension: Continue medication, monitor blood pressure at home. Continue DASH diet.  Reminder to go to the ER if any CP, SOB, nausea, dizziness, severe HA, changes vision/speech, left arm numbness and tingling, and jaw pain. Cholesterol: Continue diet and exercise. Check cholesterol.  Pre-diabetes-Continue diet and exercise. Check A1C Vitamin D Def- check level and continue medications.  Obesity with co morbidities- long discussion about weight loss, diet, and exercise   Continue diet and meds as discussed. Further disposition pending results of labs.  HPI 63 y.o. female  presents for 3 month follow up with hypertension, hyperlipidemia, prediabetes and vitamin D. Her blood pressure has been controlled at home, today their BP is BP: 132/72 mmHg She does workout, trying to increase her walking. She denies chest pain, shortness of breath, dizziness.  She is on cholesterol medication, welchol due to intolerance to statins and denies myalgias. Her cholesterol is not at goal. The cholesterol last visit was:   Lab Results  Component Value Date   CHOL 247* 01/18/2014   HDL 59 01/18/2014   LDLCALC 147* 01/18/2014   TRIG 204* 01/18/2014   CHOLHDL 4.2 01/18/2014   She has been working on diet and exercise for prediabetes, and denies polydipsia, polyuria and visual disturbances. Last A1C in the office was:  Lab Results  Component Value Date   HGBA1C 5.9* 11/16/2013   Patient is on Vitamin D supplement.   Lab Results  Component Value Date   VD25OH 34 01/18/2014     BMI is Body mass index is 31.62 kg/(m^2)., she is working on diet and exercise and has done well.  Wt Readings from Last 3 Encounters:  06/27/14 190 lb (86.183 kg)  02/23/14 191 lb (86.637 kg)  01/18/14 184 lb (83.462 kg)     Current Medications:  Current Outpatient Prescriptions on File Prior to Visit  Medication Sig Dispense Refill  . aspirin 81 MG tablet Take 81 mg by mouth daily.      .  benazepril-hydrochlorthiazide (LOTENSIN HCT) 20-12.5 MG per tablet TAKE 1 TABLET BY MOUTH DAILY.  90 tablet  1  . Calcium Carbonate-Vitamin D (CALCIUM + D PO) Take 1 tablet by mouth 2 (two) times daily.      . Flaxseed, Linseed, (FLAXSEED OIL) 1000 MG CAPS Take 1,000 mg by mouth daily.      . Multiple Vitamin (MULTIVITAMIN) tablet Take 1 tablet by mouth daily.      . Omega-3 Fatty Acids (FISH OIL) 1200 MG CAPS Take by mouth.      Earnestine Mealing 625 MG tablet TAKE 3 TABLETS BY MOUTH TWICE A DAY  180 tablet  3   No current facility-administered medications on file prior to visit.   Medical History:  Past Medical History  Diagnosis Date  . Hypertension   . Hyperlipidemia   . DDD (degenerative disc disease)   . Anemia   . AAA (abdominal aortic aneurysm)   . Splenic artery aneurysm   . Vitamin D deficiency   . Allergic rhinitis, cause unspecified   . Osteopenia 12/2011    Spine T -0.4, Femur -2.1   Allergies:  Allergies  Allergen Reactions  . Lescol [Fluvastatin Sodium]     Myalgia  . Lipitor [Atorvastatin]     Increased LFT's  . Vytorin [Ezetimibe-Simvastatin]     myalgia    Review of Systems: [X]  = complains of  [ ]  = denies  General: Fatigue [ ]  Fever [ ]  Chills [ ]  Weakness [ ]   Insomnia [ ]  Eyes: Redness [ ]  Blurred vision [ ]  Diplopia [ ]   ENT: Congestion [ ]  Sinus Pain [ ]  Post Nasal Drip [ ]  Sore Throat [ ]  Earache [ ]   Cardiac: Chest pain/pressure [ ]  SOB [ ]  Orthopnea [ ]   Palpitations [ ]   Paroxysmal nocturnal dyspnea[ ]  Claudication [ ]  Edema [ ]   Pulmonary: Cough [ ]  Wheezing[ ]   SOB [ ]   Snoring [ ]   GI: Nausea [ ]  Vomiting[ ]  Dysphagia[ ]  Heartburn[ ]  Abdominal pain [ ]  Constipation [ ] ; Diarrhea [ ] ; BRBPR [ ]  Melena[ ]  GU: Hematuria[ ]  Dysuria [ ]  Nocturia[ ]  Urgency [ ]   Hesitancy [ ]  Discharge [ ]  Neuro: Headaches[ ]  Vertigo[ ]  Paresthesias[ ]  Spasm [ ]  Speech changes [ ]  Incoordination [ ]   Ortho: Arthritis [ ]  Joint pain [ ]  Muscle pain [ ]  Joint swelling [ ]   Back Pain [ ]  Skin:  Rash [ ]   Pruritis [ ]  Change in skin lesion [ ]   Psych: Depression[ ]  Anxiety[ ]  Confusion [ ]  Memory loss [ ]   Heme/Lypmh: Bleeding [ ]  Bruising [ ]  Enlarged lymph nodes [ ]   Endocrine: Visual blurring [ ]  Paresthesia [ ]  Polyuria [ ]  Polydypsea [ ]    Heat/cold intolerance [ ]  Hypoglycemia [ ]   Family history- Review and unchanged Social history- Review and unchanged Physical Exam: BP 132/72  Pulse 76  Temp(Src) 97.9 F (36.6 C)  Resp 16  Ht 5\' 5"  (1.651 m)  Wt 190 lb (86.183 kg)  BMI 31.62 kg/m2 Wt Readings from Last 3 Encounters:  06/27/14 190 lb (86.183 kg)  02/23/14 191 lb (86.637 kg)  01/18/14 184 lb (83.462 kg)   General Appearance: Well nourished, in no apparent distress. Eyes: PERRLA, EOMs, conjunctiva no swelling or erythema Sinuses: No Frontal/maxillary tenderness ENT/Mouth: Ext aud canals clear, TMs without erythema, bulging. No erythema, swelling, or exudate on post pharynx.  Tonsils not swollen or erythematous. Hearing normal.  Neck: Supple, thyroid normal.  Respiratory: Respiratory effort normal, BS equal bilaterally without rales, rhonchi, wheezing or stridor.  Cardio: RRR with no MRGs. Brisk peripheral pulses without edema.  Abdomen: Soft, + BS.  Non tender, no guarding, rebound, hernias, masses. Lymphatics: Non tender without lymphadenopathy.  Musculoskeletal: Full ROM, 5/5 strength, normal gait.  Skin: Warm, dry without rashes, lesions, ecchymosis.  Neuro: Cranial nerves intact. Normal muscle tone, no cerebellar symptoms. Sensation intact.  Psych: Awake and oriented X 3, normal affect, Insight and Judgment appropriate.    Vicie Mutters, PA-C 10:31 AM [facility]

## 2014-06-28 LAB — HEPATIC FUNCTION PANEL
ALBUMIN: 4.7 g/dL (ref 3.5–5.2)
ALK PHOS: 61 U/L (ref 39–117)
ALT: 46 U/L — AB (ref 0–35)
AST: 36 U/L (ref 0–37)
BILIRUBIN INDIRECT: 0.4 mg/dL (ref 0.2–1.2)
Bilirubin, Direct: 0.1 mg/dL (ref 0.0–0.3)
Total Bilirubin: 0.5 mg/dL (ref 0.2–1.2)
Total Protein: 7.4 g/dL (ref 6.0–8.3)

## 2014-06-28 LAB — INSULIN, FASTING: INSULIN FASTING, SERUM: 26 u[IU]/mL — AB (ref 2.0–19.6)

## 2014-06-28 LAB — BASIC METABOLIC PANEL WITH GFR
BUN: 16 mg/dL (ref 6–23)
CHLORIDE: 99 meq/L (ref 96–112)
CO2: 26 meq/L (ref 19–32)
Calcium: 9.8 mg/dL (ref 8.4–10.5)
Creat: 0.8 mg/dL (ref 0.50–1.10)
GFR, EST NON AFRICAN AMERICAN: 78 mL/min
GFR, Est African American: 89 mL/min
GLUCOSE: 97 mg/dL (ref 70–99)
POTASSIUM: 4.7 meq/L (ref 3.5–5.3)
Sodium: 136 mEq/L (ref 135–145)

## 2014-06-28 LAB — LIPID PANEL
Cholesterol: 245 mg/dL — ABNORMAL HIGH (ref 0–200)
HDL: 62 mg/dL (ref >39)
LDL CALC: 151 mg/dL — AB (ref 0–99)
Total CHOL/HDL Ratio: 4 Ratio
Triglycerides: 158 mg/dL — ABNORMAL HIGH (ref <150)
VLDL: 32 mg/dL (ref 0–40)

## 2014-06-28 LAB — TSH: TSH: 1.176 u[IU]/mL (ref 0.350–4.500)

## 2014-06-28 LAB — HEMOGLOBIN A1C
HEMOGLOBIN A1C: 5.9 % — AB (ref <5.7)
MEAN PLASMA GLUCOSE: 123 mg/dL — AB (ref <117)

## 2014-06-28 LAB — MAGNESIUM: MAGNESIUM: 1.8 mg/dL (ref 1.5–2.5)

## 2014-06-29 ENCOUNTER — Ambulatory Visit: Payer: Self-pay | Admitting: Physician Assistant

## 2014-08-10 ENCOUNTER — Encounter (HOSPITAL_COMMUNITY): Payer: Self-pay | Admitting: Surgery

## 2014-09-01 DIAGNOSIS — C50919 Malignant neoplasm of unspecified site of unspecified female breast: Secondary | ICD-10-CM

## 2014-09-01 DIAGNOSIS — Z923 Personal history of irradiation: Secondary | ICD-10-CM

## 2014-09-01 DIAGNOSIS — Z9221 Personal history of antineoplastic chemotherapy: Secondary | ICD-10-CM

## 2014-09-01 HISTORY — DX: Malignant neoplasm of unspecified site of unspecified female breast: C50.919

## 2014-09-01 HISTORY — PX: BREAST LUMPECTOMY: SHX2

## 2014-09-01 HISTORY — DX: Personal history of antineoplastic chemotherapy: Z92.21

## 2014-09-01 HISTORY — DX: Personal history of irradiation: Z92.3

## 2014-11-21 ENCOUNTER — Other Ambulatory Visit: Payer: Self-pay | Admitting: Internal Medicine

## 2014-12-18 DIAGNOSIS — L820 Inflamed seborrheic keratosis: Secondary | ICD-10-CM | POA: Diagnosis not present

## 2014-12-18 DIAGNOSIS — D2262 Melanocytic nevi of left upper limb, including shoulder: Secondary | ICD-10-CM | POA: Diagnosis not present

## 2014-12-18 DIAGNOSIS — L570 Actinic keratosis: Secondary | ICD-10-CM | POA: Diagnosis not present

## 2015-01-22 ENCOUNTER — Ambulatory Visit (INDEPENDENT_AMBULATORY_CARE_PROVIDER_SITE_OTHER): Payer: Medicare Other | Admitting: Internal Medicine

## 2015-01-22 ENCOUNTER — Encounter: Payer: Self-pay | Admitting: Internal Medicine

## 2015-01-22 VITALS — BP 118/72 | HR 72 | Temp 98.0°F | Resp 16 | Ht 64.5 in | Wt 194.0 lb

## 2015-01-22 DIAGNOSIS — Z780 Asymptomatic menopausal state: Secondary | ICD-10-CM

## 2015-01-22 DIAGNOSIS — Z1212 Encounter for screening for malignant neoplasm of rectum: Secondary | ICD-10-CM

## 2015-01-22 DIAGNOSIS — D649 Anemia, unspecified: Secondary | ICD-10-CM | POA: Diagnosis not present

## 2015-01-22 DIAGNOSIS — Z79899 Other long term (current) drug therapy: Secondary | ICD-10-CM

## 2015-01-22 DIAGNOSIS — Z23 Encounter for immunization: Secondary | ICD-10-CM | POA: Diagnosis not present

## 2015-01-22 DIAGNOSIS — Z0000 Encounter for general adult medical examination without abnormal findings: Secondary | ICD-10-CM

## 2015-01-22 DIAGNOSIS — R6889 Other general symptoms and signs: Secondary | ICD-10-CM

## 2015-01-22 DIAGNOSIS — Z0001 Encounter for general adult medical examination with abnormal findings: Secondary | ICD-10-CM

## 2015-01-22 DIAGNOSIS — R030 Elevated blood-pressure reading, without diagnosis of hypertension: Secondary | ICD-10-CM

## 2015-01-22 DIAGNOSIS — E559 Vitamin D deficiency, unspecified: Secondary | ICD-10-CM

## 2015-01-22 DIAGNOSIS — E785 Hyperlipidemia, unspecified: Secondary | ICD-10-CM

## 2015-01-22 DIAGNOSIS — Z9181 History of falling: Secondary | ICD-10-CM

## 2015-01-22 DIAGNOSIS — R7309 Other abnormal glucose: Secondary | ICD-10-CM | POA: Diagnosis not present

## 2015-01-22 DIAGNOSIS — C20 Malignant neoplasm of rectum: Secondary | ICD-10-CM

## 2015-01-22 DIAGNOSIS — Z1331 Encounter for screening for depression: Secondary | ICD-10-CM

## 2015-01-22 DIAGNOSIS — R7303 Prediabetes: Secondary | ICD-10-CM

## 2015-01-22 LAB — CBC WITH DIFFERENTIAL/PLATELET
BASOS ABS: 0.1 10*3/uL (ref 0.0–0.1)
Basophils Relative: 2 % — ABNORMAL HIGH (ref 0–1)
Eosinophils Absolute: 0.4 10*3/uL (ref 0.0–0.7)
Eosinophils Relative: 6 % — ABNORMAL HIGH (ref 0–5)
HCT: 41.6 % (ref 36.0–46.0)
HEMOGLOBIN: 13.7 g/dL (ref 12.0–15.0)
LYMPHS PCT: 24 % (ref 12–46)
Lymphs Abs: 1.4 10*3/uL (ref 0.7–4.0)
MCH: 29.8 pg (ref 26.0–34.0)
MCHC: 32.9 g/dL (ref 30.0–36.0)
MCV: 90.6 fL (ref 78.0–100.0)
MONO ABS: 0.4 10*3/uL (ref 0.1–1.0)
MPV: 10.1 fL (ref 8.6–12.4)
Monocytes Relative: 7 % (ref 3–12)
Neutro Abs: 3.6 10*3/uL (ref 1.7–7.7)
Neutrophils Relative %: 61 % (ref 43–77)
PLATELETS: 288 10*3/uL (ref 150–400)
RBC: 4.59 MIL/uL (ref 3.87–5.11)
RDW: 13.8 % (ref 11.5–15.5)
WBC: 5.9 10*3/uL (ref 4.0–10.5)

## 2015-01-22 LAB — BASIC METABOLIC PANEL WITH GFR
BUN: 15 mg/dL (ref 6–23)
CO2: 26 meq/L (ref 19–32)
CREATININE: 0.74 mg/dL (ref 0.50–1.10)
Calcium: 9.2 mg/dL (ref 8.4–10.5)
Chloride: 102 mEq/L (ref 96–112)
GFR, EST NON AFRICAN AMERICAN: 85 mL/min
GFR, Est African American: >89 mL/min
Glucose, Bld: 106 mg/dL — ABNORMAL HIGH (ref 70–99)
Potassium: 4.2 mEq/L (ref 3.5–5.3)
Sodium: 138 mEq/L (ref 135–145)

## 2015-01-22 LAB — HEMOGLOBIN A1C
Hgb A1c MFr Bld: 6.2 % — ABNORMAL HIGH
Mean Plasma Glucose: 131 mg/dL — ABNORMAL HIGH

## 2015-01-22 LAB — HEPATIC FUNCTION PANEL
ALT: 36 U/L — ABNORMAL HIGH (ref 0–35)
AST: 28 U/L (ref 0–37)
Albumin: 4.2 g/dL (ref 3.5–5.2)
Alkaline Phosphatase: 51 U/L (ref 39–117)
Bilirubin, Direct: 0.1 mg/dL (ref 0.0–0.3)
Indirect Bilirubin: 0.4 mg/dL (ref 0.2–1.2)
TOTAL PROTEIN: 6.9 g/dL (ref 6.0–8.3)
Total Bilirubin: 0.5 mg/dL (ref 0.2–1.2)

## 2015-01-22 LAB — IRON AND TIBC
%SAT: 24 % (ref 20–55)
Iron: 70 ug/dL (ref 42–145)
TIBC: 293 ug/dL (ref 250–470)
UIBC: 223 ug/dL (ref 125–400)

## 2015-01-22 LAB — MAGNESIUM: Magnesium: 1.8 mg/dL (ref 1.5–2.5)

## 2015-01-22 LAB — LIPID PANEL
Cholesterol: 233 mg/dL — ABNORMAL HIGH (ref 0–200)
HDL: 54 mg/dL
LDL Cholesterol: 158 mg/dL — ABNORMAL HIGH (ref 0–99)
Total CHOL/HDL Ratio: 4.3 ratio
Triglycerides: 107 mg/dL
VLDL: 21 mg/dL (ref 0–40)

## 2015-01-22 LAB — TSH: TSH: 1.003 u[IU]/mL (ref 0.350–4.500)

## 2015-01-22 LAB — VITAMIN B12: Vitamin B-12: 726 pg/mL (ref 211–911)

## 2015-01-22 NOTE — Progress Notes (Signed)
Patient ID: Jacqueline Jenkins, female   DOB: Jan 10, 1951, 64 y.o.   MRN: [id number]   [facility] VISIT AND CPE  Assessment:    1. Prediabetes -diet and exercise - Hemoglobin A1c - Insulin, random  2. Hyperlipidemia -consider praluent as patient has now failed zetia, statins, and welchol - Lipid panel  3. Anemia, unspecified anemia type  - Iron and TIBC - Vitamin B12  4. Vitamin D deficiency -cont supplement - Vit D  25 hydroxy (rtn osteoporosis monitoring)  5. Blood pressure elevated without history of HTN  - Urinalysis, Routine w reflex microscopic - Microalbumin / creatinine urine ratio - EKG 12-Lead - TSH  6. Medication management  - CBC with Differential/Platelet - BASIC METABOLIC PANEL WITH GFR - Hepatic function panel - Magnesium  7. Screening for rectal cancer  - POC Hemoccult Bld/Stl (3-Cd Home Screen); Future     Plan:   During the course of the visit the patient was educated and counseled about appropriate screening and preventive services including:    Pneumococcal vaccine   Influenza vaccine  Td vaccine  Screening electrocardiogram  Bone densitometry screening  Colorectal cancer screening  Diabetes screening  Glaucoma screening  Nutrition counseling   Advanced directives: requested  Screening recommendations, referrals: Vaccinations:  Immunization History  Administered Date(s) Administered  . Influenza Split 06/08/2014  . Influenza-Unspecified 06/14/2013  . Meningococcal Conjugate 09/02/2011  . PPD Test 01/18/2014  . Pneumococcal-Unspecified 09/22/1995, 09/02/2011  . Td 11/27/1994, 04/29/2004   Td, Shingles, and prevnar due.  Patient would like to have Td today and will wait on prevnar and shingles until next visit.    Nutrition assessed and recommended  Colonoscopy not indicated, 2013 Recommended yearly ophthalmology/optometry visit for glaucoma screening and checkup Recommended yearly dental visit for hygiene  and checkup Advanced directives - not indicated  Conditions/risks identified: BMI: Discussed weight loss, diet, and increase physical activity.  Increase physical activity: AHA recommends 150 minutes of physical activity a week.  Medications reviewed Diabetes is at goal, ACE/ARB therapy: No, Reason not on Ace Inhibitor/ARB therapy:  not indicated Urinary Incontinence is not an issue: discussed non pharmacology and pharmacology options.  Fall risk: low- discussed PT, home fall assessment, medications.   Subjective:    Jacqueline Jenkins is a 64 y.o.  female who presents for Medicare Annual Wellness Visit and complete physical.  Date of last medicare wellness visit is unknown.  She has had elevated blood pressure without the diagnosis of HTN.   Her blood pressure has been controlled at home, & today their BP is BP: 118/72 mmHg She does workout. She is adding in walking to her daily schedule.  She is walking 30 min per day.  She denies chest pain, shortness of breath, dizziness. No leg swelling.  She is on cholesterol medication and denies myalgias. Her cholesterol is not at goal. The cholesterol last visit was:  Lab Results  Component Value Date   CHOL 245* 06/27/2014   HDL 62 06/27/2014   LDLCALC 151* 06/27/2014   TRIG 158* 06/27/2014   CHOLHDL 4.0 06/27/2014  She was started on Welchol and she cannot tolerate the medication due to severe muscle cramps and leg pains.    She has had prediabetes.  She has not been working on diet and exercise for prediabetes, and denies foot ulcerations, hyperglycemia, hypoglycemia , increased appetite, nausea, paresthesia of the feet, polydipsia, polyuria, visual disturbances, vomiting and weight loss. Last A1C in the office was:  Lab Results  Component  Value Date   HGBA1C 5.9* 06/27/2014  Breakfast is cereal.  Lunch is usually a salad and some leftovers from previous nights dinner.  Dinner last night was hot dog and cupcake.    Patient is on Vitamin D  supplement.   Lab Results  Component Value Date   VD25OH 34 01/18/2014     Patient is currently taking 50,000 once weekly.  Names of Other Physician/Practitioners you currently use: 1. [facility] here for primary care 2. Dr. Claudean Kinds, eye doctor, last visit 2015 3. Dr. Derenda Mis, dentist, last visit 2014  Patient Care Team: Unk Pinto, MD as PCP - General (Internal Medicine) Ralene Bathe, MD as Consulting Physician (Ophthalmology) Serafina Mitchell, MD as Consulting Physician (Vascular Surgery) Carol Ada, MD as Consulting Physician (Gastroenterology) Amy Martinique, MD as Consulting Physician (Dermatology)  Medication Review: Current Outpatient Prescriptions on File Prior to Visit  Medication Sig Dispense Refill  . aspirin 81 MG tablet Take 81 mg by mouth daily.    . benazepril-hydrochlorthiazide (LOTENSIN HCT) 20-12.5 MG per tablet TAKE 1 TABLET BY MOUTH DAILY. 90 tablet 0  . Calcium Carbonate-Vitamin D (CALCIUM + D PO) Take 1 tablet by mouth 2 (two) times daily.    . Flaxseed, Linseed, (FLAXSEED OIL) 1000 MG CAPS Take 1,000 mg by mouth daily.    . fluticasone (FLONASE) 50 MCG/ACT nasal spray Place 1 spray into both nostrils as needed for allergies or rhinitis.    . Multiple Vitamin (MULTIVITAMIN) tablet Take 1 tablet by mouth daily.    . Omega-3 Fatty Acids (FISH OIL) 1200 MG CAPS Take by mouth.     No current facility-administered medications on file prior to visit.    Current Problems (verified) Patient Active Problem List   Diagnosis Date Noted  . Prediabetes 11/16/2013  . Blood pressure elevated without history of HTN 11/16/2013  . Allergic rhinitis, cause unspecified   . Hyperlipidemia   . DDD (degenerative disc disease)   . Anemia   . Vitamin D deficiency   . Abdominal aneurysm without mention of rupture 01/19/2012  . Aneurysm of splenic artery 01/19/2012    Screening Tests Health Maintenance  Topic Date Due  . HIV  Screening  05/10/1964  . COLONOSCOPY  05/11/1999  . ZOSTAVAX  05/10/2009  . TETANUS/TDAP  04/29/2014  . PNA vac Low Risk Adult (1 of 2 - PCV13) 05/10/2014  . INFLUENZA VACCINE  04/02/2015  . MAMMOGRAM  10/08/2015  . DEXA SCAN  Completed    Immunization History  Administered Date(s) Administered  . Influenza Split 06/08/2014  . Influenza-Unspecified 06/14/2013  . Meningococcal Conjugate 09/02/2011  . PPD Test 01/18/2014  . Pneumococcal-Unspecified 09/22/1995, 09/02/2011  . Td 11/27/1994, 04/29/2004    Preventative care: Last colonoscopy: 2013 Due for DEXA. Due for mamogram  History reviewed: allergies, current medications, past family history, past medical history, past social history, past surgical history and problem list  Risk Factors: Tobacco History  Substance Use Topics  . Smoking status: Never Smoker   . Smokeless tobacco: Never Used  . Alcohol Use: Yes     Comment: 3-4 times per year   She does not smoke.  Patient is not a former smoker. Are there smokers in your home (other than you)?  No Alcohol Current alcohol use: none  Caffeine Current caffeine use: coffee 2 /day  Exercise Current exercise: walking  Nutrition/Diet Current diet: on average, 2-3 meals per day  Cardiac risk factors: advanced age (older than 67 for men, 60  for women), family history of premature cardiovascular disease, hypertension, obesity (BMI >= 30 kg/m2) and sedentary lifestyle.  Depression Screen (Note: if answer to either of the following is "Yes", a more complete depression screening is indicated)   Q1: Over the past two weeks, have you felt down, depressed or hopeless? No  Q2: Over the past two weeks, have you felt little interest or pleasure in doing things? No  Have you lost interest or pleasure in daily life? No  Do you often feel hopeless? No  Do you cry easily over simple problems? No  Activities of Daily Living In your present state of health, do you have any  difficulty performing the following activities?:  Driving? No Managing money?  No Feeding yourself? No Getting from bed to chair? No Climbing a flight of stairs? No Preparing food and eating?: No Bathing or showering? No Getting dressed: No Getting to the toilet? No Using the toilet:No Moving around from place to place: No In the past year have you fallen or had a near fall?:No   Are you sexually active?  Yes  Do you have more than one partner?  No  Vision Difficulties: No  Hearing Difficulties: No Do you often ask people to speak up or repeat themselves? No Do you experience ringing or noises in your ears? No Do you have difficulty understanding soft or whispered voices? Sometimes.  Cognition  Do you feel that you have a problem with memory?No  Do you often misplace items? No  Do you feel safe at home?  Yes  Advanced directives Does patient have a [place]? Yes Does patient have a Living Will? Yes  Past Medical History  Diagnosis Date  . Hypertension   . Hyperlipidemia   . DDD (degenerative disc disease)   . Anemia   . AAA (abdominal aortic aneurysm)   . Splenic artery aneurysm   . Vitamin D deficiency   . Allergic rhinitis, cause unspecified   . Osteopenia 12/2011    Spine T -0.4, Femur -2.1   Past Surgical History  Procedure Laterality Date  . Tonsillectomy    . Spine surgery  1998    cervical diskectomy  . Spine surgery  2001, 2010    Lumbar diskectomy  . Cervical disc surgery  1998  . Lumbar disc surgery  2001, 2010  . Splenic aneurysm  02/10/2012    Aneurysm of splenic artery  . Colonoscopy  Jan. 7, 2014  . Visceral angiogram N/A 02/10/2012    Procedure: VISCERAL ANGIOGRAM;  Surgeon: Serafina Mitchell, MD;  Location[facility] CATH LAB;  Service: Cardiovascular;  Laterality: N/A;    Review of Systems  Constitutional: Positive for malaise/fatigue. Negative for fever, chills and weight loss.  HENT: Negative for congestion, ear pain,  nosebleeds and sore throat.   Eyes: Negative.   Respiratory: Negative for cough, shortness of breath and wheezing.   Cardiovascular: Negative for chest pain, palpitations and leg swelling.  Gastrointestinal: Positive for heartburn (occasionally). Negative for nausea, vomiting, diarrhea, constipation, blood in stool and melena.  Genitourinary: Positive for frequency. Negative for dysuria and urgency.  Musculoskeletal: Negative.   Skin: Negative.   Neurological: Negative for dizziness, sensory change, loss of consciousness and headaches.  Psychiatric/Behavioral: Negative for depression. The patient is not nervous/anxious and does not have insomnia.      Objective:     BP 118/72 mmHg  Pulse 72  Temp(Src) 98 F (36.7 C) (Temporal)  Resp 16  Ht 5' 4.5" (  1.638 m)  Wt 194 lb (87.998 kg)  BMI 32.80 kg/m2  General Appearance: Well nourished, alert, WD/WN, female and in no apparent distress. Eyes: PERRLA, EOMs, conjunctiva no swelling or erythema, normal fundi and vessels. Sinuses: No frontal/maxillary tenderness ENT/Mouth: EACs patent / TMs  nl. Nares clear without erythema, swelling, mucoid exudates. Oral hygiene is good. No erythema, swelling, or exudate. Tongue normal, non-obstructing. Tonsils not swollen or erythematous. Hearing normal.  Neck: Supple, thyroid normal. No bruits, nodes or JVD. Respiratory: Respiratory effort normal.  BS equal and clear bilateral without rales, rhonci, wheezing or stridor. Cardio: Heart sounds are normal with regular rate and rhythm and no murmurs, rubs or gallops. Peripheral pulses are normal and equal bilaterally without edema. No aortic or femoral bruits. Chest: symmetric with normal excursions and percussion. Breasts: Symmetric, without lumps, nipple discharge, retractions, or fibrocystic changes.  Abdomen: Flat, soft  with nl bowel sounds. Nontender, no guarding, rebound, hernias, masses, or organomegaly.  Lymphatics: Non tender without  lymphadenopathy.  Genitourinary: Deferred to ObGyn Musculoskeletal: Full ROM all peripheral extremities, joint stability, 5/5 strength, and normal gait. Skin: Warm and dry without rashes, lesions, cyanosis, clubbing or  ecchymosis. Multiple seborrheic keratosis across the chest and the abdomen. Neuro: Cranial nerves intact, reflexes equal bilaterally. Normal muscle tone, no cerebellar symptoms. Sensation intact.  Pysch: Alert and oriented X 3, normal affect, Insight and Judgment appropriate.   Cognitive Testing  Alert? Yes  Normal Appearance?Yes  Oriented to person? Yes  Place? Yes   Time? Yes  Recall of three objects?  Yes  Can perform simple calculations? Yes  Displays appropriate judgment? Yes  Can read the correct time from a watch/clock?Yes  Medicare Attestation I have personally reviewed: The patient's medical and social history Their use of alcohol, tobacco or illicit drugs Their current medications and supplements The patient's functional ability including ADLs,fall risks, home safety risks, cognitive, and hearing and visual impairment Diet and physical activities Evidence for depression or mood disorders  The patient's weight, height, BMI, and visual acuity have been recorded in the chart.  I have made referrals, counseling, and provided education to the patient based on review of the above and I have provided the patient with a written personalized care plan for preventive services.  Over 40 minutes of exam, counseling, chart review was performed.   Starlyn Skeans, PA-C   01/22/2015

## 2015-01-22 NOTE — Patient Instructions (Signed)
Preventive Care for Adults  A healthy lifestyle and preventive care can promote health and wellness. Preventive health guidelines for women include the following key practices.  A routine yearly physical is a good way to check with your health care provider about your health and preventive screening. It is a chance to share any concerns and updates on your health and to receive a thorough exam.  Visit your dentist for a routine exam and preventive care every 6 months. Brush your teeth twice a day and floss once a day. Good oral hygiene prevents tooth decay and gum disease.  The frequency of eye exams is based on your age, health, family medical history, use of contact lenses, and other factors. Follow your health care provider's recommendations for frequency of eye exams.  Eat a healthy diet. Foods like vegetables, fruits, whole grains, low-fat dairy products, and lean protein foods contain the nutrients you need without too many calories. Decrease your intake of foods high in solid fats, added sugars, and salt. Eat the right amount of calories for you.Get information about a proper diet from your health care provider, if necessary.  Regular physical exercise is one of the most important things you can do for your health. Most adults should get at least 150 minutes of moderate-intensity exercise (any activity that increases your heart rate and causes you to sweat) each week. In addition, most adults need muscle-strengthening exercises on 2 or more days a week.  Maintain a healthy weight. The body mass index (BMI) is a screening tool to identify possible weight problems. It provides an estimate of body fat based on height and weight. Your health care provider can find your BMI and can help you achieve or maintain a healthy weight.For adults 20 years and older:  A BMI below 18.5 is considered underweight.  A BMI of 18.5 to 24.9 is normal.  A BMI of 25 to 29.9 is considered overweight.  A BMI  of 30 and above is considered obese.  Maintain normal blood lipids and cholesterol levels by exercising and minimizing your intake of saturated fat. Eat a balanced diet with plenty of fruit and vegetables. If your lipid or cholesterol levels are high, you are over 50, or you are at high risk for heart disease, you may need your cholesterol levels checked more frequently.Ongoing high lipid and cholesterol levels should be treated with medicines if diet and exercise are not working.  If you smoke, find out from your health care provider how to quit. If you do not use tobacco, do not start.  Lung cancer screening is recommended for adults aged 25-80 years who are at high risk for developing lung cancer because of a history of smoking. A yearly low-dose CT scan of the lungs is recommended for people who have at least a 30-pack-year history of smoking and are a current smoker or have quit within the past 15 years. A pack year of smoking is smoking an average of 1 pack of cigarettes a day for 1 year (for example: 1 pack a day for 30 years or 2 packs a day for 15 years). Yearly screening should continue until the smoker has stopped smoking for at least 15 years. Yearly screening should be stopped for people who develop a health problem that would prevent them from having lung cancer treatment.  Avoid use of street drugs. Do not share needles with anyone. Ask for help if you need support or instructions about stopping the use of drugs.  High  blood pressure causes heart disease and increases the risk of stroke.  Ongoing high blood pressure should be treated with medicines if weight loss and exercise do not work.  If you are 55-79 years old, ask your health care provider if you should take aspirin to prevent strokes.  Diabetes screening involves taking a blood sample to check your fasting blood sugar level. This should be done once every 3 years, after age 45, if you are within normal weight and without risk  factors for diabetes. Testing should be considered at a younger age or be carried out more frequently if you are overweight and have at least 1 risk factor for diabetes.  Breast cancer screening is essential preventive care for women. You should practice "breast self-awareness." This means understanding the normal appearance and feel of your breasts and may include breast self-examination. Any changes detected, no matter how small, should be reported to a health care provider. Women in their 20s and 30s should have a clinical breast exam (CBE) by a health care provider as part of a regular health exam every 1 to 3 years. After age 40, women should have a CBE every year. Starting at age 40, women should consider having a mammogram (breast X-ray test) every year. Women who have a family history of breast cancer should talk to their health care provider about genetic screening. Women at a high risk of breast cancer should talk to their health care providers about having an MRI and a mammogram every year.  Breast cancer gene (BRCA)-related cancer risk assessment is recommended for women who have family members with BRCA-related cancers. BRCA-related cancers include breast, ovarian, tubal, and peritoneal cancers. Having family members with these cancers may be associated with an increased risk for harmful changes (mutations) in the breast cancer genes BRCA1 and BRCA2. Results of the assessment will determine the need for genetic counseling and BRCA1 and BRCA2 testing.  Routine pelvic exams to screen for cancer are no longer recommended for nonpregnant women who are considered low risk for cancer of the pelvic organs (ovaries, uterus, and vagina) and who do not have symptoms. Ask your health care provider if a screening pelvic exam is right for you.  If you have had past treatment for cervical cancer or a condition that could lead to cancer, you need Pap tests and screening for cancer for at least 20 years after  your treatment. If Pap tests have been discontinued, your risk factors (such as having a new sexual partner) need to be reassessed to determine if screening should be resumed. Some women have medical problems that increase the chance of getting cervical cancer. In these cases, your health care provider may recommend more frequent screening and Pap tests.    Colorectal cancer can be detected and often prevented. Most routine colorectal cancer screening begins at the age of 50 years and continues through age 75 years. However, your health care provider may recommend screening at an earlier age if you have risk factors for colon cancer. On a yearly basis, your health care provider may provide home test kits to check for hidden blood in the stool. Use of a small camera at the end of a tube, to directly examine the colon (sigmoidoscopy or colonoscopy), can detect the earliest forms of colorectal cancer. Talk to your health care provider about this at age 50, when routine screening begins. Direct exam of the colon should be repeated every 5-10 years through age 75 years, unless early forms of pre-cancerous   polyps or small growths are found.  Osteoporosis is a disease in which the bones lose minerals and strength with aging. This can result in serious bone fractures or breaks. The risk of osteoporosis can be identified using a bone density scan. Women ages 4 years and over and women at risk for fractures or osteoporosis should discuss screening with their health care providers. Ask your health care provider whether you should take a calcium supplement or vitamin D to reduce the rate of osteoporosis.  Menopause can be associated with physical symptoms and risks. Hormone replacement therapy is available to decrease symptoms and risks. You should talk to your health care provider about whether hormone replacement therapy is right for you.  Use sunscreen. Apply sunscreen liberally and repeatedly throughout the day.  You should seek shade when your shadow is shorter than you. Protect yourself by wearing long sleeves, pants, a wide-brimmed hat, and sunglasses year round, whenever you are outdoors.  Once a month, do a whole body skin exam, using a mirror to look at the skin on your back. Tell your health care provider of new moles, moles that have irregular borders, moles that are larger than a pencil eraser, or moles that have changed in shape or color.  Stay current with required vaccines (immunizations).  Influenza vaccine. All adults should be immunized every year.  Tetanus, diphtheria, and acellular pertussis (Td, Tdap) vaccine. Pregnant women should receive 1 dose of Tdap vaccine during each pregnancy. The dose should be obtained regardless of the length of time since the last dose. Immunization is preferred during the 27th-36th week of gestation. An adult who has not previously received Tdap or who does not know her vaccine status should receive 1 dose of Tdap. This initial dose should be followed by tetanus and diphtheria toxoids (Td) booster doses every 10 years. Adults with an unknown or incomplete history of completing a 3-dose immunization series with Td-containing vaccines should begin or complete a primary immunization series including a Tdap dose. Adults should receive a Td booster every 10 years.    Zoster vaccine. One dose is recommended for adults aged 64 years or older unless certain conditions are present.    Pneumococcal 13-valent conjugate (PCV13) vaccine. When indicated, a person who is uncertain of her immunization history and has no record of immunization should receive the PCV13 vaccine. An adult aged 81 years or older who has certain medical conditions and has not been previously immunized should receive 1 dose of PCV13 vaccine. This PCV13 should be followed with a dose of pneumococcal polysaccharide (PPSV23) vaccine. The PPSV23 vaccine dose should be obtained at least 8 weeks after the  dose of PCV13 vaccine. An adult aged 59 years or older who has certain medical conditions and previously received 1 or more doses of PPSV23 vaccine should receive 1 dose of PCV13. The PCV13 vaccine dose should be obtained 1 or more years after the last PPSV23 vaccine dose.    Pneumococcal polysaccharide (PPSV23) vaccine. When PCV13 is also indicated, PCV13 should be obtained first. All adults aged 60 years and older should be immunized. An adult younger than age 73 years who has certain medical conditions should be immunized. Any person who resides in a nursing home or long-term care facility should be immunized. An adult smoker should be immunized. People with an immunocompromised condition and certain other conditions should receive both PCV13 and PPSV23 vaccines. People with human immunodeficiency virus (HIV) infection should be immunized as soon as possible after diagnosis. Immunization  during chemotherapy or radiation therapy should be avoided. Routine use of PPSV23 vaccine is not recommended for American Indians, [place]atives, or people younger than 65 years unless there are medical conditions that require PPSV23 vaccine. When indicated, people who have unknown immunization and have no record of immunization should receive PPSV23 vaccine. One-time revaccination 5 years after the first dose of PPSV23 is recommended for people aged 19-64 years who have chronic kidney failure, nephrotic syndrome, asplenia, or immunocompromised conditions. People who received 1-2 doses of PPSV23 before age 52 years should receive another dose of PPSV23 vaccine at age 7 years or later if at least 5 years have passed since the previous dose. Doses of PPSV23 are not needed for people immunized with PPSV23 at or after age 33 years.   Preventive Services / Frequency  Ages 24 years and over  Blood pressure check.  Lipid and cholesterol check.  Lung cancer screening. / Every year if you are aged 57-80 years and have a  30-pack-year history of smoking and currently smoke or have quit within the past 15 years. Yearly screening is stopped once you have quit smoking for at least 15 years or develop a health problem that would prevent you from having lung cancer treatment.  Clinical breast exam.** / Every year after age 108 years.  BRCA-related cancer risk assessment.** / For women who have family members with a BRCA-related cancer (breast, ovarian, tubal, or peritoneal cancers).  Mammogram.** / Every year beginning at age 95 years and continuing for as long as you are in good health. Consult with your health care provider.  Pap test.** / Every 3 years starting at age 45 years through age 52 or 15 years with 3 consecutive normal Pap tests. Testing can be stopped between 65 and 70 years with 3 consecutive normal Pap tests and no abnormal Pap or HPV tests in the past 10 years.  Fecal occult blood test (FOBT) of stool. / Every year beginning at age 89 years and continuing until age 17 years. You may not need to do this test if you get a colonoscopy every 10 years.  Flexible sigmoidoscopy or colonoscopy.** / Every 5 years for a flexible sigmoidoscopy or every 10 years for a colonoscopy beginning at age 87 years and continuing until age 60 years.  Hepatitis C blood test.** / For all people born from [birth year] through 1967 and any individual with known risks for hepatitis C.  Osteoporosis screening.** / A one-time screening for women ages 52 years and over and women at risk for fractures or osteoporosis.  Skin self-exam. / Monthly.  Influenza vaccine. / Every year.  Tetanus, diphtheria, and acellular pertussis (Tdap/Td) vaccine.** / 1 dose of Td every 10 years.  Zoster vaccine.** / 1 dose for adults aged 42 years or older.  Pneumococcal 13-valent conjugate (PCV13) vaccine.** / Consult your health care provider.  Pneumococcal polysaccharide (PPSV23) vaccine.** / 1 dose for all adults aged 37 years and older. Screening  for abdominal aortic aneurysm (AAA)  by ultrasound is recommended for people who have history of high blood pressure or who are current or former smokers.

## 2015-01-22 NOTE — Addendum Note (Signed)
Addended by: Per Beagley A on: 01/22/2015 11:10 AM   Modules accepted: Orders

## 2015-01-23 ENCOUNTER — Other Ambulatory Visit: Payer: Self-pay | Admitting: Internal Medicine

## 2015-01-23 LAB — URINALYSIS, ROUTINE W REFLEX MICROSCOPIC
Bilirubin Urine: NEGATIVE
GLUCOSE, UA: NEGATIVE mg/dL
HGB URINE DIPSTICK: NEGATIVE
Ketones, ur: NEGATIVE mg/dL
Nitrite: NEGATIVE
Protein, ur: NEGATIVE mg/dL
SPECIFIC GRAVITY, URINE: 1.017 (ref 1.005–1.030)
Urobilinogen, UA: 0.2 mg/dL (ref 0.0–1.0)
pH: 6.5 (ref 5.0–8.0)

## 2015-01-23 LAB — MICROALBUMIN / CREATININE URINE RATIO
CREATININE, URINE: 113.9 mg/dL
Microalb Creat Ratio: 6.1 mg/g (ref 0.0–30.0)
Microalb, Ur: 0.7 mg/dL (ref <2.0)

## 2015-01-23 LAB — URINALYSIS, MICROSCOPIC ONLY
Bacteria, UA: NONE SEEN
Casts: NONE SEEN
Crystals: NONE SEEN

## 2015-01-23 LAB — VITAMIN D 25 HYDROXY (VIT D DEFICIENCY, FRACTURES): Vit D, 25-Hydroxy: 22 ng/mL — ABNORMAL LOW (ref 30–100)

## 2015-01-23 LAB — INSULIN, RANDOM: INSULIN: 33.5 u[IU]/mL — AB (ref 2.0–19.6)

## 2015-01-23 MED ORDER — CIPROFLOXACIN HCL 500 MG PO TABS: 500.0000 mg | ORAL_TABLET | Freq: Two times a day (BID) | ORAL | Status: DC

## 2015-01-24 ENCOUNTER — Telehealth: Payer: Self-pay | Admitting: Internal Medicine

## 2015-01-24 MED ORDER — CEPHALEXIN 500 MG PO CAPS: 500.0000 mg | ORAL_CAPSULE | Freq: Four times a day (QID) | ORAL | Status: AC

## 2015-01-24 NOTE — Telephone Encounter (Signed)
Patient aware.

## 2015-01-24 NOTE — Telephone Encounter (Signed)
Patient recently diagnosed with UTI and started on cipro.  Patient reports nausea and dizziness.  She is requesting different abx.  Culture pending.  Will change to keflex.

## 2015-02-20 ENCOUNTER — Other Ambulatory Visit: Payer: Self-pay | Admitting: Internal Medicine

## 2015-02-26 ENCOUNTER — Other Ambulatory Visit: Payer: Self-pay

## 2015-05-08 ENCOUNTER — Ambulatory Visit: Payer: Self-pay | Admitting: Internal Medicine

## 2015-06-04 DIAGNOSIS — Z23 Encounter for immunization: Secondary | ICD-10-CM | POA: Diagnosis not present

## 2015-06-07 ENCOUNTER — Ambulatory Visit (INDEPENDENT_AMBULATORY_CARE_PROVIDER_SITE_OTHER): Payer: Medicare Other | Admitting: Internal Medicine

## 2015-06-07 ENCOUNTER — Encounter: Payer: Self-pay | Admitting: Internal Medicine

## 2015-06-07 VITALS — BP 124/74 | HR 68 | Temp 97.7°F | Resp 16 | Ht 65.0 in | Wt 166.6 lb

## 2015-06-07 DIAGNOSIS — Z79899 Other long term (current) drug therapy: Secondary | ICD-10-CM | POA: Insufficient documentation

## 2015-06-07 DIAGNOSIS — E785 Hyperlipidemia, unspecified: Secondary | ICD-10-CM | POA: Diagnosis not present

## 2015-06-07 DIAGNOSIS — I10 Essential (primary) hypertension: Secondary | ICD-10-CM | POA: Insufficient documentation

## 2015-06-07 DIAGNOSIS — N3090 Cystitis, unspecified without hematuria: Secondary | ICD-10-CM

## 2015-06-07 DIAGNOSIS — Z6827 Body mass index (BMI) 27.0-27.9, adult: Secondary | ICD-10-CM | POA: Diagnosis not present

## 2015-06-07 DIAGNOSIS — R7309 Other abnormal glucose: Secondary | ICD-10-CM

## 2015-06-07 DIAGNOSIS — E559 Vitamin D deficiency, unspecified: Secondary | ICD-10-CM | POA: Diagnosis not present

## 2015-06-07 DIAGNOSIS — R7303 Prediabetes: Secondary | ICD-10-CM | POA: Diagnosis not present

## 2015-06-07 LAB — CBC WITH DIFFERENTIAL/PLATELET
BASOS ABS: 0.1 10*3/uL (ref 0.0–0.1)
Basophils Relative: 1 % (ref 0–1)
EOS PCT: 4 % (ref 0–5)
Eosinophils Absolute: 0.3 10*3/uL (ref 0.0–0.7)
HEMATOCRIT: 40.1 % (ref 36.0–46.0)
HEMOGLOBIN: 13.3 g/dL (ref 12.0–15.0)
LYMPHS ABS: 1.5 10*3/uL (ref 0.7–4.0)
Lymphocytes Relative: 24 % (ref 12–46)
MCH: 30.4 pg (ref 26.0–34.0)
MCHC: 33.2 g/dL (ref 30.0–36.0)
MCV: 91.8 fL (ref 78.0–100.0)
MPV: 10.4 fL (ref 8.6–12.4)
Monocytes Absolute: 0.4 10*3/uL (ref 0.1–1.0)
Monocytes Relative: 7 % (ref 3–12)
NEUTROS ABS: 4.1 10*3/uL (ref 1.7–7.7)
Neutrophils Relative %: 64 % (ref 43–77)
Platelets: 293 10*3/uL (ref 150–400)
RBC: 4.37 MIL/uL (ref 3.87–5.11)
RDW: 13.3 % (ref 11.5–15.5)
WBC: 6.4 10*3/uL (ref 4.0–10.5)

## 2015-06-07 MED ORDER — LISINOPRIL-HYDROCHLOROTHIAZIDE 20-12.5 MG PO TABS: ORAL_TABLET | ORAL | Status: DC

## 2015-06-07 NOTE — Patient Instructions (Addendum)
Recommend Adult Low dose Aspirin or   coated  Aspirin 81 mg daily   To reduce risk of Colon Cancer 20 %,   Skin Cancer 26 % ,   Melanoma 46%   and   Pancreatic cancer 60%  ++++++++++++++++++  Vitamin D goal   is between 70-100.   Please make sure that you are taking your Vitamin D as directed.   It is very important as a natural anti-inflammatory   helping hair, skin, and nails, as well as reducing stroke and heart attack risk.   It helps your bones and helps with mood.  It also decreases numerous cancer risks so please take it as directed.   Low Vit D is associated with a 200-300% higher risk for CANCER   and 200-300% higher risk for HEART   ATTACK  &  STROKE.   .....................................Marland Kitchen  It is also associated with higher death rate at younger ages,   autoimmune diseases like Rheumatoid arthritis, Lupus, Multiple Sclerosis.     Also many other serious conditions, like depression, Alzheimer's  Dementia, infertility, muscle aches, fatigue, fibromyalgia - just to name a few.  +++++++++++++++++++  Recommend the book "The END of DIETING" by Dr Excell Seltzer   & the book "The END of DIABETES " by Dr Excell Seltzer  At [facility] [email or web address] - get book & Audio CD's     Being diabetic has a  300% increased risk for heart attack, stroke, cancer, and alzheimer- type vascular dementia. It is very important that you work harder with diet by avoiding all foods that are white. Avoid white rice (brown & wild rice is OK), white potatoes (sweetpotatoes in moderation is OK), White bread or wheat bread or anything made out of white flour like bagels, donuts, rolls, buns, biscuits, cakes, pastries, cookies, pizza crust, and pasta (made from white flour & egg whites) - vegetarian pasta or spinach or wheat pasta is OK. Multigrain breads like Arnold's or Pepperidge Farm, or multigrain sandwich thins or flatbreads.  Diet, exercise and weight loss can reverse and cure diabetes in the early  stages.  Diet, exercise and weight loss is very important in the control and prevention of complications of diabetes which affects every system in your body, ie. Brain - dementia/stroke, eyes - glaucoma/blindness, heart - heart attack/heart failure, kidneys - dialysis, stomach - gastric paralysis, intestines - malabsorption, nerves - severe painful neuritis, circulation - gangrene & loss of a leg(s), and finally cancer and Alzheimers.    I recommend avoid fried & greasy foods,  sweets/candy, white rice (brown or wild rice or Quinoa is OK), white potatoes (sweet potatoes are OK) - anything made from white flour - bagels, doughnuts, rolls, buns, biscuits,white and wheat breads, pizza crust and traditional pasta made of white flour & egg white(vegetarian pasta or spinach or wheat pasta is OK).  Multi-grain bread is OK - like multi-grain flat bread or sandwich thins. Avoid alcohol in excess. Exercise is also important.    Eat all the vegetables you want - avoid meat, especially red meat and dairy - especially cheese.  Cheese is the most concentrated form of trans-fats which is the worst thing to clog up our arteries. Veggie cheese is OK which can be found in the fresh produce section at [facility] or [employer] or [employer]  ++++++++++++++++++++++++++  High Cholesterol High cholesterol refers to having a high level of cholesterol in your blood. Cholesterol is a white, waxy, fat-like protein that your body needs in small amounts. Your  liver makes all the cholesterol you need. Excess cholesterol comes from the food you eat. Cholesterol travels in your bloodstream through your blood vessels. If you have high cholesterol, deposits (plaque) may build up on the walls of your blood vessels. This makes the arteries narrower and stiffer. Plaque increases your risk of heart attack and stroke. Work with your health care provider to keep your cholesterol levels in a healthy range. RISK FACTORS Several things can  make you more likely to have high cholesterol. These include:    - Cholesterol only comes from animal sources - ie. meat, dairy, eggs - Eat all the vegetables you want - avoid meat, especially red meat and dairy - especially cheese.  Cheese is the most concentrated form of trans-fats which is the worst thing to clog up our arteries. Veggie cheese is OK which can be found in the fresh produce section at [employer] or [employer] or [employer]   Eating foods high in animal fat (saturated fat) or cholesterol.  Being overweight.  Not getting enough exercise.  Having a family history of high cholesterol. SIGNS AND SYMPTOMS High cholesterol does not cause symptoms.  DIAGNOSIS  Your health care provider can do a blood test to check whether you have high cholesterol. If you are older than 20, your health care provider may check your cholesterol every 4-6 years. You may be checked more often if you already have high cholesterol or other risk factors for heart disease. The blood test for cholesterol measures the following:  Bad cholesterol (LDL cholesterol). This is the type of cholesterol that causes heart disease. This number should be less than 100.  Good cholesterol (HDL cholesterol). This type helps protect against heart disease. A healthy level of HDL cholesterol is 60 or higher.  Total cholesterol. This is the combined number of LDL cholesterol and HDL cholesterol. A healthy number is less than 200.   TREATMENT  High cholesterol can be treated with diet changes, lifestyle changes, and medicine.   Diet changes may include eating more whole grains, fruits, vegetables, nuts, and fish. You may also have to cut back on red meat and foods with a lot of added sugar.  Lifestyle changes may include getting at least 40 minutes of aerobic exercise three times a week. Aerobic exercises include walking, biking, and swimming. Aerobic exercise along with a healthy diet can help you maintain a healthy  weight. Lifestyle changes may also include quitting smoking.  If diet and lifestyle changes are not enough to lower your cholesterol, your health care provider may prescribe a statin medicine. This medicine has been shown to lower cholesterol and also lower the risk of heart disease. HOME CARE INSTRUCTIONS  Only take over-the-counter or prescription medicines as directed by your health care provider.   Follow a healthy diet as directed by your health care provider. For instance:   Eat fish, shellfish,  Do not eat fried foods and fatty meats, such as hot dogs and salami.   Eat plenty of fruits, such as apples.   Eat plenty of vegetables, such as broccoli, potatoes, and carrots.   Eat beans, peas, and lentils.   Eat grains, such as barley, rice, couscous, and bulgur wheat.   Eat pasta without cream sauces.   Use soy milk ([place]) or Almond milk and low-fat or nonfat yogurt and  Veggie cheeses. Do not eat or drink whole milk, cream, ice cream, egg yolks, and hard cheeses.   Do not eat stick margarine or tub margarines  that contain trans fats (also called partially hydrogenated oils).   Do not eat cakes, cookies, crackers, or other baked goods that contain trans fats.   Do not eat saturated tropical oils, such as coconut and palm oil.   Exercise as directed by your health care provider. Increase your activity level with activities such as gardening or walking.   Keep all follow-up appointments.  SEEK MEDICAL CARE IF:  You are struggling to maintain a healthy diet or weight.  You need help starting an exercise program.  You need help to stop smoking. SEEK IMMEDIATE MEDICAL CARE IF:  You have chest pain.  You have trouble breathing.   This information is not intended to replace advice given to you by your health care provider. Make sure you discuss any questions you have with your health care provider.

## 2015-06-07 NOTE — Progress Notes (Signed)
Patient ID: Jacqueline Jenkins, female   DOB: 28-Mar-1951, 64 y.o.   MRN: [id number]   This very nice 64 y.o. MWF presents for  follow up with Hypertension, Hyperlipidemia, Pre-Diabetes and Vitamin D Deficiency.    Patient is treated for HTN circa 1990's & BP has been controlled at home. Today's BP: 124/74 mmHg. Patient has had no complaints of any cardiac type chest pain, palpitations, dyspnea/orthopnea/PND, dizziness, claudication, or dependent edema.   Hyperlipidemia is not controlled with diet & she has been intolerant to Lipitor/Zocor (elev LFT's), Zetia & Welchol. Last Lipids were not at goal - Cholesterol 233*; HDL 54; LDL 158*; Triglycerides 107 on 01/22/2015.  Patient alleges 30 # wt loss over the last 6 months.    Also, the patient has history of PreDiabetes and has had no symptoms of reactive hypoglycemia, diabetic polys, paresthesias or visual blurring.  Last A1c was 6.2% on 01/22/2015.   Further, the patient also has history of Vitamin D Deficiency and supplements vitamin D without any suspected side-effects. Last vitamin D was still very low at 22 on  01/22/2015.  Medication Sig  . aspirin 81 MG tablet Take 81 mg by mouth daily.  Marland Kitchen CALCIUM + D  Take 1 tablet by mouth 2 (two) times daily.  Marland Kitchen FLAXSEED OIL 1000 MG CAPS Take 1,000 mg by mouth daily.  Marland Kitchen FLONASE  nasal spray Place 1 spray into both nostrils as needed for allergies or rhinitis.  Marland Kitchen MULTIVITAMIN tablet Take 1 tablet by mouth daily.  Marland Kitchen FISH OIL 1200 MG CAPS Take by mouth.   Allergies  Allergen Reactions  . Lescol [Fluvastatin Sodium]     Myalgia  . Lipitor [Atorvastatin]     Increased LFT's  . Vytorin [Ezetimibe-Simvastatin]     myalgia   PMHx:   Past Medical History  Diagnosis Date  . Hypertension   . Hyperlipidemia   . DDD (degenerative disc disease)   . Anemia   . AAA (abdominal aortic aneurysm) ([place])   . Splenic artery aneurysm ([place])   . Vitamin D deficiency   . Allergic rhinitis, cause unspecified   . Osteopenia  12/2011    Spine T -0.4, Femur -2.1   Immunization History  Administered Date(s) Administered  . DT 01/22/2015  . Influenza Split 06/08/2014  . Influenza-Unspecified 06/14/2013, 06/04/2015  . Meningococcal Conjugate 09/02/2011  . PPD Test 01/18/2014  . Pneumococcal-Unspecified 09/22/1995, 09/02/2011  . Td 11/27/1994, 04/29/2004   Past Surgical History  Procedure Laterality Date  . Tonsillectomy    . Spine surgery  1998    cervical diskectomy  . Spine surgery  2001, 2010    Lumbar diskectomy  . Cervical disc surgery  1998  . Lumbar disc surgery  2001, 2010  . Splenic aneurysm  02/10/2012    Aneurysm of splenic artery  . Colonoscopy  Jan. 7, 2014  . Visceral angiogram N/A 02/10/2012    Procedure: VISCERAL ANGIOGRAM;  Surgeon: Serafina Mitchell, MD;  Location: [facility] CATH LAB;  Service: Cardiovascular;  Laterality: N/A;   FHx:    Reviewed / unchanged  SHx:    Reviewed / unchanged  Systems Review:  Constitutional: Denies fever, chills, wt changes, headaches, insomnia, fatigue, night sweats, change in appetite. Eyes: Denies redness, blurred vision, diplopia, discharge, itchy, watery eyes.  ENT: Denies discharge, congestion, post nasal drip, epistaxis, sore throat, earache, hearing loss, dental pain, tinnitus, vertigo, sinus pain, snoring.  CV: Denies chest pain, palpitations, irregular heartbeat, syncope, dyspnea, diaphoresis, orthopnea, PND, claudication or edema.  Respiratory: denies cough, dyspnea, DOE, pleurisy, hoarseness, laryngitis, wheezing.  Gastrointestinal: Denies dysphagia, odynophagia, heartburn, reflux, water brash, abdominal pain or cramps, nausea, vomiting, bloating, diarrhea, constipation, hematemesis, melena, hematochezia  or hemorrhoids. Genitourinary: Denies dysuria, frequency, urgency, nocturia, hesitancy, discharge, hematuria or flank pain. Musculoskeletal: Denies arthralgias, myalgias, stiffness, jt. swelling, pain, limping or strain/sprain.  Skin: Denies pruritus,  rash, hives, warts, acne, eczema or change in skin lesion(s). Neuro: No weakness, tremor, incoordination, spasms, paresthesia or pain. Psychiatric: Denies confusion, memory loss or sensory loss. Endo: Denies change in weight, skin or hair change.  Heme/Lymph: No excessive bleeding, bruising or enlarged lymph nodes.  Physical Exam  BP 124/74 mmHg  Pulse 68  Temp(Src) 97.7 F (36.5 C)  Resp 16  Ht 5\' 5"  (1.651 m)  Wt 166 lb 9.6 oz (75.569 kg)  BMI 27.72 kg/m2  Appears well nourished and in no distress. Eyes: PERRLA, EOMs, conjunctiva no swelling or erythema. Sinuses: No frontal/maxillary tenderness ENT/Mouth: EAC's clear, TM's nl w/o erythema, bulging. Nares clear w/o erythema, swelling, exudates. Oropharynx clear without erythema or exudates. Oral hygiene is good. Tongue normal, non obstructing. Hearing intact.  Neck: Supple. Thyroid nl. Car 2+/2+ without bruits, nodes or JVD. Chest: Respirations nl with BS clear & equal w/o rales, rhonchi, wheezing or stridor.  Cor: Heart sounds normal w/ regular rate and rhythm without sig. murmurs, gallops, clicks, or rubs. Peripheral pulses normal and equal  without edema.  Abdomen: Soft & bowel sounds normal. Non-tender w/o guarding, rebound, hernias, masses, or organomegaly.  Lymphatics: Unremarkable.  Musculoskeletal: Full ROM all peripheral extremities, joint stability, 5/5 strength, and normal gait.  Skin: Warm, dry without exposed rashes, lesions or ecchymosis apparent.  Neuro: Cranial nerves intact, reflexes equal bilaterally. Sensory-motor testing grossly intact. Tendon reflexes grossly intact.  Pysch: Alert & oriented x 3.  Insight and judgement nl & appropriate. No ideations.  Assessment and Plan:  1. Essential hypertension  - TSH  2. Hyperlipidemia  - Lipid panel  3. Prediabetes  - Hemoglobin A1c - Insulin, random  4. Vitamin D deficiency  - Vit D  25 hydroxy   5. Cystitis  - Urine culture - Urinalysis, Routine w  reflex microscopic   6. Other abnormal glucose  - Hemoglobin A1c  7. BMI 27.72,  adult   8. Medication management  - CBC with Differential/Platelet - BASIC METABOLIC PANEL WITH GFR - Hepatic function panel - Magnesium   Recommended regular exercise, BP monitoring, weight control, and discussed med and SE's. Recommended labs to assess and monitor clinical status. Further disposition pending results of labs. Over 30 minutes of exam, counseling, chart review was performed

## 2015-06-08 ENCOUNTER — Other Ambulatory Visit: Payer: Self-pay | Admitting: *Deleted

## 2015-06-08 DIAGNOSIS — I10 Essential (primary) hypertension: Secondary | ICD-10-CM

## 2015-06-08 LAB — BASIC METABOLIC PANEL WITH GFR
BUN: 14 mg/dL (ref 7–25)
CHLORIDE: 99 mmol/L (ref 98–110)
CO2: 24 mmol/L (ref 20–31)
Calcium: 9.7 mg/dL (ref 8.6–10.4)
Creat: 0.62 mg/dL (ref 0.50–0.99)
GFR, Est African American: >89 mL/min (ref >=60)
GLUCOSE: 81 mg/dL (ref 65–99)
POTASSIUM: 3.9 mmol/L (ref 3.5–5.3)
Sodium: 135 mmol/L (ref 135–146)

## 2015-06-08 LAB — URINALYSIS, ROUTINE W REFLEX MICROSCOPIC
Bilirubin Urine: NEGATIVE
Glucose, UA: NEGATIVE
Hgb urine dipstick: NEGATIVE
NITRITE: NEGATIVE
PH: 7 (ref 5.0–8.0)
Protein, ur: NEGATIVE
SPECIFIC GRAVITY, URINE: 1.018 (ref 1.001–1.035)

## 2015-06-08 LAB — INSULIN, RANDOM: Insulin: 11.1 u[IU]/mL (ref 2.0–19.6)

## 2015-06-08 LAB — HEPATIC FUNCTION PANEL
ALK PHOS: 58 U/L (ref 33–130)
ALT: 21 U/L (ref 6–29)
AST: 22 U/L (ref 10–35)
Albumin: 4.4 g/dL (ref 3.6–5.1)
BILIRUBIN INDIRECT: 0.5 mg/dL (ref 0.2–1.2)
Bilirubin, Direct: 0.1 mg/dL (ref <=0.2)
TOTAL PROTEIN: 7.1 g/dL (ref 6.1–8.1)
Total Bilirubin: 0.6 mg/dL (ref 0.2–1.2)

## 2015-06-08 LAB — URINALYSIS, MICROSCOPIC ONLY
Bacteria, UA: NONE SEEN [HPF]
CASTS: NONE SEEN [LPF]
Crystals: NONE SEEN [HPF]
RBC / HPF: NONE SEEN RBC/HPF (ref <=2)
Yeast: NONE SEEN [HPF]

## 2015-06-08 LAB — LIPID PANEL
Cholesterol: 243 mg/dL — ABNORMAL HIGH (ref 125–200)
HDL: 58 mg/dL (ref >=46)
LDL CALC: 164 mg/dL — AB (ref <130)
TRIGLYCERIDES: 104 mg/dL (ref <150)
Total CHOL/HDL Ratio: 4.2 Ratio (ref <=5.0)
VLDL: 21 mg/dL (ref <30)

## 2015-06-08 LAB — HEMOGLOBIN A1C
Hgb A1c MFr Bld: 5.7 % — ABNORMAL HIGH (ref <5.7)
MEAN PLASMA GLUCOSE: 117 mg/dL — AB (ref <117)

## 2015-06-08 LAB — VITAMIN D 25 HYDROXY (VIT D DEFICIENCY, FRACTURES): Vit D, 25-Hydroxy: 29 ng/mL — ABNORMAL LOW (ref 30–100)

## 2015-06-08 LAB — TSH: TSH: 0.844 u[IU]/mL (ref 0.350–4.500)

## 2015-06-08 LAB — MAGNESIUM: Magnesium: 1.9 mg/dL (ref 1.5–2.5)

## 2015-06-09 LAB — URINE CULTURE: Colony Count: 60000

## 2015-06-11 ENCOUNTER — Other Ambulatory Visit: Payer: Self-pay | Admitting: Internal Medicine

## 2015-06-11 DIAGNOSIS — N3090 Cystitis, unspecified without hematuria: Secondary | ICD-10-CM

## 2015-06-11 MED ORDER — AMOXICILLIN 250 MG PO CAPS: ORAL_CAPSULE | ORAL | Status: AC

## 2015-06-18 ENCOUNTER — Other Ambulatory Visit: Payer: Self-pay

## 2015-06-18 DIAGNOSIS — Z1231 Encounter for screening mammogram for malignant neoplasm of breast: Secondary | ICD-10-CM

## 2015-07-23 ENCOUNTER — Ambulatory Visit
Admission: RE | Admit: 2015-07-23 | Discharge: 2015-07-23 | Disposition: A | Discharge status: Home / Self Care | Payer: Medicare Other | Source: Ambulatory Visit

## 2015-07-23 ENCOUNTER — Ambulatory Visit
Admission: RE | Admit: 2015-07-23 | Discharge: 2015-07-23 | Disposition: A | Discharge status: Home / Self Care | Payer: Medicare Other | Source: Ambulatory Visit | Attending: Internal Medicine | Admitting: Internal Medicine

## 2015-07-23 DIAGNOSIS — Z1231 Encounter for screening mammogram for malignant neoplasm of breast: Secondary | ICD-10-CM

## 2015-07-23 DIAGNOSIS — Z780 Asymptomatic menopausal state: Secondary | ICD-10-CM

## 2015-07-23 DIAGNOSIS — M85851 Other specified disorders of bone density and structure, right thigh: Secondary | ICD-10-CM | POA: Diagnosis not present

## 2015-07-30 ENCOUNTER — Other Ambulatory Visit: Payer: Self-pay | Admitting: Obstetrics and Gynecology

## 2015-07-30 DIAGNOSIS — R928 Other abnormal and inconclusive findings on diagnostic imaging of breast: Secondary | ICD-10-CM

## 2015-08-03 ENCOUNTER — Ambulatory Visit
Admission: RE | Admit: 2015-08-03 | Discharge: 2015-08-03 | Disposition: A | Discharge status: Home / Self Care | Payer: Medicare Other | Source: Ambulatory Visit | Attending: Obstetrics and Gynecology | Admitting: Obstetrics and Gynecology

## 2015-08-03 ENCOUNTER — Other Ambulatory Visit: Payer: Self-pay | Admitting: Obstetrics and Gynecology

## 2015-08-03 DIAGNOSIS — R928 Other abnormal and inconclusive findings on diagnostic imaging of breast: Secondary | ICD-10-CM

## 2015-08-03 DIAGNOSIS — N63 Unspecified lump in breast: Secondary | ICD-10-CM | POA: Diagnosis not present

## 2015-08-08 ENCOUNTER — Other Ambulatory Visit: Payer: Self-pay | Admitting: Obstetrics and Gynecology

## 2015-08-08 DIAGNOSIS — R928 Other abnormal and inconclusive findings on diagnostic imaging of breast: Secondary | ICD-10-CM

## 2015-08-09 ENCOUNTER — Ambulatory Visit
Admission: RE | Admit: 2015-08-09 | Discharge: 2015-08-09 | Disposition: A | Discharge status: Home / Self Care | Payer: Medicare Other | Source: Ambulatory Visit | Attending: Obstetrics and Gynecology | Admitting: Obstetrics and Gynecology

## 2015-08-09 DIAGNOSIS — C50412 Malignant neoplasm of upper-outer quadrant of left female breast: Secondary | ICD-10-CM | POA: Diagnosis not present

## 2015-08-09 DIAGNOSIS — R928 Other abnormal and inconclusive findings on diagnostic imaging of breast: Secondary | ICD-10-CM

## 2015-08-09 DIAGNOSIS — N6489 Other specified disorders of breast: Secondary | ICD-10-CM | POA: Diagnosis not present

## 2015-08-17 ENCOUNTER — Other Ambulatory Visit: Payer: Self-pay | Admitting: General Surgery

## 2015-08-17 DIAGNOSIS — C50912 Malignant neoplasm of unspecified site of left female breast: Secondary | ICD-10-CM | POA: Diagnosis not present

## 2015-08-20 ENCOUNTER — Telehealth: Payer: Self-pay | Admitting: Hematology and Oncology

## 2015-08-20 NOTE — Telephone Encounter (Signed)
new patient appt-s/w patient and gave np appt for 12/20 @ 3:45 Dr. Lindi Adie, 12/29 @ 1 w/ Jeanine Luz.  Referring Dr. Excell Seltzer  Referral information scanned

## 2015-08-21 ENCOUNTER — Encounter (HOSPITAL_BASED_OUTPATIENT_CLINIC_OR_DEPARTMENT_OTHER): Payer: Self-pay | Admitting: *Deleted

## 2015-08-21 ENCOUNTER — Encounter: Payer: Self-pay | Admitting: *Deleted

## 2015-08-21 ENCOUNTER — Other Ambulatory Visit: Payer: Self-pay | Admitting: General Surgery

## 2015-08-21 ENCOUNTER — Ambulatory Visit (HOSPITAL_BASED_OUTPATIENT_CLINIC_OR_DEPARTMENT_OTHER): Payer: Medicare Other | Admitting: Hematology and Oncology

## 2015-08-21 ENCOUNTER — Encounter: Payer: Self-pay | Admitting: Hematology and Oncology

## 2015-08-21 VITALS — BP 146/82 | HR 86 | Temp 98.1°F | Resp 18 | Ht 65.0 in | Wt 167.7 lb

## 2015-08-21 DIAGNOSIS — C50912 Malignant neoplasm of unspecified site of left female breast: Secondary | ICD-10-CM

## 2015-08-21 DIAGNOSIS — C50412 Malignant neoplasm of upper-outer quadrant of left female breast: Secondary | ICD-10-CM | POA: Diagnosis not present

## 2015-08-21 NOTE — Assessment & Plan Note (Signed)
Left breast 1:00 position 08/09/2015: Invasive ductal carcinoma grade 1-2, ER 100%, PR 10%, HER-2 negative ratio 1.79, Ki-67 15%, T1 BN 0 stage I a clinical stage  Pathology and radiology counseling:Discussed with the patient, the details of pathology including the type of breast cancer,the clinical staging, the significance of ER, PR and HER-2/neu receptors and the implications for treatment. After reviewing the pathology in detail, we proceeded to discuss the different treatment options between surgery, radiation, chemotherapy, antiestrogen therapies.  Recommendations: 1. Breast conserving surgery followed by 2. Oncotype DX testing to determine if chemotherapy would be of any benefit if the final tumor size is greater than 5 mm) followed by 3. Adjuvant radiation therapy followed by 4. Adjuvant antiestrogen therapy  Oncotype counseling: I discussed Oncotype DX test. I explained to the patient that this is a 21 gene panel to evaluate patient tumors DNA to calculate recurrence score. This would help determine whether patient has high risk or intermediate risk or low risk breast cancer. She understands that if her tumor was found to be high risk, she would benefit from systemic chemotherapy. If low risk, no need of chemotherapy. If she was found to be intermediate risk, we would need to evaluate the score as well as other risk factors and determine if an abbreviated chemotherapy may be of benefit.  Return to clinic after surgery to discuss final pathology report and then determine if Oncotype DX testing will need to be sent.

## 2015-08-21 NOTE — Progress Notes (Signed)
[place] NOTE  Patient Care Team: Unk Pinto, MD as PCP - General (Internal Medicine) Ralene Bathe, MD as Consulting Physician (Ophthalmology) Serafina Mitchell, MD as Consulting Physician (Vascular Surgery) Carol Ada, MD as Consulting Physician (Gastroenterology) Amy Martinique, MD as Consulting Physician (Dermatology) Nicholas Lose, MD as Consulting Physician (Hematology and Oncology) Excell Seltzer, MD as Consulting Physician (General Surgery)  CHIEF COMPLAINTS/PURPOSE OF CONSULTATION:  Newly diagnosed breast cancer  HISTORY OF PRESENTING ILLNESS:  Jacqueline Jenkins 63 y.o. female is here because of recent diagnosis of left breast cancer. She had a routine screening mammogram that revealed an abnormality in the left breast measuring 6 mm in size. She had additional imaging and ultrasound followed by ultrasound-guided biopsy on 08/09/2015. She has seen Dr. Excell Seltzer who is planning to do lumpectomy on 08/29/2015. She was sent to [place] for discussion regarding treatment options. She is recovering from the biopsies which cause some bruising in the left breast. She reports to me that her husband has Parkinson's disease and cannot drive. She has a daughter in [place] and another daughter in [place].  I reviewed her records extensively and collaborated the history with the patient.  SUMMARY OF ONCOLOGIC HISTORY:   Breast cancer of upper-outer quadrant of left female breast ([place])   08/03/2015 Mammogram Left Breast: 6 mm mass @ 1 o clock   08/09/2015 Initial Diagnosis Left breast 1:00 position: Invasive ductal carcinoma grade 1-2, ER 100%, PR 10%, HER-2 negative ratio 1.79, Ki-67 15%, T1 BN 0 stage I a clinical stage   MEDICAL HISTORY:  Past Medical History  Diagnosis Date  . Hypertension   . Hyperlipidemia   . DDD (degenerative disc disease)   . Vitamin D deficiency   . Allergic rhinitis, cause unspecified   . Osteopenia 12/2011    Spine T -0.4, Femur -2.1  .  PONV (postoperative nausea and vomiting)   . Splenic artery aneurysm [facility])     SURGICAL HISTORY: Past Surgical History  Procedure Laterality Date  . Tonsillectomy    . Spine surgery  1998    cervical diskectomy  . Spine surgery  2001, 2010    Lumbar diskectomy  . Cervical disc surgery  1998  . Lumbar disc surgery  2001, 2010  . Splenic aneurysm  02/10/2012    Aneurysm of splenic artery  . Colonoscopy  Jan. 7, 2014  . Visceral angiogram N/A 02/10/2012    Procedure: VISCERAL ANGIOGRAM;  Surgeon: Serafina Mitchell, MD;  Location: [facility] CATH LAB;  Service: Cardiovascular;  Laterality: N/A;    SOCIAL HISTORY: Social History   Social History  . Marital Status: Married    Spouse Name: N/A  . Number of Children: N/A  . Years of Education: N/A   Occupational History  . Not on file.   Social History Main Topics  . Smoking status: Never Smoker   . Smokeless tobacco: Never Used  . Alcohol Use: Yes     Comment: 3-4 times per year  . Drug Use: No  . Sexual Activity: Not on file   Other Topics Concern  . Not on file   Social History Narrative    FAMILY HISTORY: Family History  Problem Relation Age of Onset  . Stroke Mother   . Osteoporosis Mother   . Hyperlipidemia Mother   . Hypertension Mother   . Other Mother     varicose veins  . Deep vein thrombosis Mother   . Cancer Maternal Grandmother 23    Breast  cancer  . Other Father     bleeding problems  . Hypertension Father     ALLERGIES:  is allergic to lescol; lipitor; and vytorin.  MEDICATIONS:  Current Outpatient Prescriptions  Medication Sig Dispense Refill  . aspirin 81 MG tablet Take 81 mg by mouth daily.    . Calcium Carbonate-Vitamin D (CALCIUM + D PO) Take 1 tablet by mouth 2 (two) times daily.    . Flaxseed, Linseed, (FLAXSEED OIL) 1000 MG CAPS Take 1,000 mg by mouth daily.    . fluticasone (FLONASE) 50 MCG/ACT nasal spray Place 1 spray into both nostrils as needed for allergies or rhinitis.    Marland Kitchen  lisinopril-hydrochlorothiazide (ZESTORETIC) 20-12.5 MG tablet Take 1 tablet daily for BP 90 tablet 99  . Multiple Vitamin (MULTIVITAMIN) tablet Take 1 tablet by mouth daily.    . Omega-3 Fatty Acids (FISH OIL) 1200 MG CAPS Take by mouth.     No current facility-administered medications for this visit.    REVIEW OF SYSTEMS:   Constitutional: Denies fevers, chills or abnormal night sweats Eyes: Denies blurriness of vision, double vision or watery eyes Ears, nose, mouth, throat, and face: Denies mucositis or sore throat Respiratory: Denies cough, dyspnea or wheezes Cardiovascular: Denies palpitation, chest discomfort or lower extremity swelling Gastrointestinal:  Denies nausea, heartburn or change in bowel habits Skin: Denies abnormal skin rashes Lymphatics: Denies new lymphadenopathy or easy bruising Neurological:Denies numbness, tingling or new weaknesses Behavioral/Psych: Mood is stable, no new changes  Breast: Bruising from recent biopsy All other systems were reviewed with the patient and are negative.  PHYSICAL EXAMINATION: ECOG PERFORMANCE STATUS: 0 - Asymptomatic  Filed Vitals:   08/21/15 1544  BP: 146/82  Pulse: 86  Temp: 98.1 F (36.7 C)  Resp: 18   Filed Weights   08/21/15 1544  Weight: 167 lb 11.2 oz (76.068 kg)    GENERAL:alert, no distress and comfortable SKIN: skin color, texture, turgor are normal, no rashes or significant lesions EYES: normal, conjunctiva are pink and non-injected, sclera clear OROPHARYNX:no exudate, no erythema and lips, buccal mucosa, and tongue normal  NECK: supple, thyroid normal size, non-tender, without nodularity LYMPH:  no palpable lymphadenopathy in the cervical, axillary or inguinal LUNGS: clear to auscultation and percussion with normal breathing effort HEART: regular rate & rhythm and no murmurs and no lower extremity edema ABDOMEN:abdomen soft, non-tender and normal bowel sounds Musculoskeletal:no cyanosis of digits and no  clubbing  PSYCH: alert & oriented x 3 with fluent speech NEURO: no focal motor/sensory deficits BREAST: No palpable nodules in breast.  mild bruising in the left breast where she had the biopsy. No palpable axillary or supraclavicular lymphadenopathy (exam performed in the presence of a chaperone)   LABORATORY DATA:  I have reviewed the data as listed Lab Results  Component Value Date   WBC 6.4 06/07/2015   HGB 13.3 06/07/2015   HCT 40.1 06/07/2015   MCV 91.8 06/07/2015   PLT 293 06/07/2015   Lab Results  Component Value Date   NA 135 06/07/2015   K 3.9 06/07/2015   CL 99 06/07/2015   CO2 24 06/07/2015   ASSESSMENT AND PLAN:  Breast cancer of upper-outer quadrant of left female breast (H[facility]) Left breast 1:00 position 08/09/2015: Invasive ductal carcinoma grade 1-2, ER 100%, PR 10%, HER-2 negative ratio 1.79, Ki-67 15%, T1 BN 0 stage I a clinical stage  Pathology and radiology counseling:Discussed with the patient, the details of pathology including the type of breast cancer,the clinical staging, the significance  of ER, PR and HER-2/neu receptors and the implications for treatment. After reviewing the pathology in detail, we proceeded to discuss the different treatment options between surgery, radiation, chemotherapy, antiestrogen therapies.  Recommendations: 1. Breast conserving surgery followed by 2. Oncotype DX testing to determine if chemotherapy would be of any benefit if the final tumor size is greater than 5 mm) followed by 3. Adjuvant radiation therapy followed by 4. Adjuvant antiestrogen therapy  Oncotype counseling: I discussed Oncotype DX test. I explained to the patient that this is a 21 gene panel to evaluate patient tumors DNA to calculate recurrence score. This would help determine whether patient has high risk or intermediate risk or low risk breast cancer. She understands that if her tumor was found to be high risk, she would benefit from systemic chemotherapy. If low  risk, no need of chemotherapy. If she was found to be intermediate risk, we would need to evaluate the score as well as other risk factors and determine if an abbreviated chemotherapy may be of benefit.  Return to clinic after surgery to discuss final pathology report and then determine if Oncotype DX testing will need to be sent.   All questions were answered. The patient knows to call the clinic with any problems, questions or concerns.    Rulon Eisenmenger, MD 08/21/2015

## 2015-08-21 NOTE — Progress Notes (Signed)
Reviewed pt's h/o with Dr. Al Corpus. No further testing required. Will come for BMET for HTN.

## 2015-08-22 NOTE — Progress Notes (Signed)
Note created by Dr. Gudena during office visit, copy to patient,original to scan. 

## 2015-08-24 ENCOUNTER — Ambulatory Visit
Admission: RE | Admit: 2015-08-24 | Discharge: 2015-08-24 | Disposition: A | Discharge status: Home / Self Care | Payer: Medicare Other | Source: Ambulatory Visit | Attending: General Surgery | Admitting: General Surgery

## 2015-08-24 ENCOUNTER — Encounter (HOSPITAL_BASED_OUTPATIENT_CLINIC_OR_DEPARTMENT_OTHER)
Admission: RE | Admit: 2015-08-24 | Discharge: 2015-08-24 | Disposition: A | Discharge status: Home / Self Care | Payer: Medicare Other | Source: Ambulatory Visit | Attending: General Surgery | Admitting: General Surgery

## 2015-08-24 ENCOUNTER — Other Ambulatory Visit: Payer: Self-pay | Admitting: Internal Medicine

## 2015-08-24 DIAGNOSIS — C50912 Malignant neoplasm of unspecified site of left female breast: Secondary | ICD-10-CM

## 2015-08-24 DIAGNOSIS — C50412 Malignant neoplasm of upper-outer quadrant of left female breast: Secondary | ICD-10-CM | POA: Diagnosis not present

## 2015-08-24 LAB — BASIC METABOLIC PANEL
Anion gap: 7 (ref 5–15)
BUN: 8 mg/dL (ref 6–20)
CHLORIDE: 102 mmol/L (ref 101–111)
CO2: 26 mmol/L (ref 22–32)
CREATININE: 0.62 mg/dL (ref 0.44–1.00)
Calcium: 9.3 mg/dL (ref 8.9–10.3)
GFR calc non Af Amer: >60 mL/min (ref >60)
GLUCOSE: 95 mg/dL (ref 65–99)
Potassium: 4.2 mmol/L (ref 3.5–5.1)
Sodium: 135 mmol/L (ref 135–145)

## 2015-08-28 NOTE — H&P (Signed)
Jacqueline Jenkins 08/17/2015 2:34 PM Location: [facility] Patient #: [id number] DOB: 1951/01/14 Married / Language: English / Race: White Female   History of Present Illness Marland Kitchen T. Kaz Auld MD; 08/17/2015 3:09 PM) Patient words: New-Breast.  The patient is a 64 year old female who presents with breast cancer. [name] is a 64 year old post menopausal female referred by Dr. Melanee Spry for evaluation of recently diagnosed carcinoma of the left breast. She recently presented for a screening mamogram revealing a possible new density in the upper left breast. Subsequent imaging included diagnostic mamogram showing a 6 mm mass with mild architectural distortion in the upper outer left breast and ultrasound showing a hypoechoic 4 x 5 mm mass at the 1 o'clock position 1 cm from the nipple.. An ultrasound guided breast biopsy was performed on August 09, 2015 with pathology revealing invasive ductal carcinoma of the breast. She is seen now in the office for initial treatment planning. She has experienced no breast symptoms, specifically lump or pain or nipple discharge or skin changes. She does not have a personal history of any previous breast problems. She has a family history of breast cancer in her maternal grandmother developed at a very young age.  Findings at that time were the following: Tumor size: 0.5 cm Tumor grade: Not reported Estrogen Receptor: Positive Progesterone Receptor: Positive Her-2 neu: Negative Lymph node status: Negative    Other Problems [name], CMA; 08/17/2015 2:37 PM) Lump In Breast  Past Surgical History [name], CMA; 08/17/2015 2:37 PM) Aneurysm Repair Spinal Surgery - Lower Back Spinal Surgery - Neck  Allergies [name], CMA; 08/17/2015 2:38 PM) Lescol *ANTIHYPERLIPIDEMICS* Lipitor *ANTIHYPERLIPIDEMICS* Vytorin *ANTIHYPERLIPIDEMICS*  Medication History [name], CMA; 08/17/2015 2:40  PM) Lisinopril-Hydrochlorothiazide (20-12.5MG Tablet, Oral) Active. Aspirin (81MG Tablet, Oral) Active. Calcium-Vitamin D (600MG Tablet Chewable, Oral) Active. Flaxseed Oil (1000MG Capsule, Oral) Active. Multivitamin Adult (Oral) Active. Fish Oil (1200MG Capsule, Oral) Active. Flonase (50MCG/ACT Suspension, Nasal) Active. Medications Reconciled  Social History [name], CMA; 08/17/2015 2:37 PM) Alcohol use Occasional alcohol use. No drug use Tobacco use Never smoker.  Family History [name], CMA; 08/17/2015 2:37 PM) Alcohol Abuse Father. Breast Cancer Family Members In General. Heart Disease Mother. Respiratory Condition Mother.  Pregnancy / Birth History [name], CMA; 08/17/2015 2:37 PM) Age of menopause 26-55 Maternal age 56-20    Review of Systems [name] CMA; 08/17/2015 2:37 PM) HEENT Present- Ringing in the Ears and Wears glasses/contact lenses. Not Present- Earache, Hearing Loss, Hoarseness, Nose Bleed, Oral Ulcers, Seasonal Allergies, Sinus Pain, Sore Throat, Visual Disturbances and Yellow Eyes. Endocrine Present- Hair Changes. Not Present- Cold Intolerance, Excessive Hunger, Heat Intolerance, Hot flashes and New Diabetes.  Vitals ([name] CMA; 08/17/2015 2:38 PM) 08/17/2015 2:37 PM Weight: 167.2 lb Height: 65in Body Surface Area: 1.83 m Body Mass Index: 27.82 kg/m  Temp.: 98.95F(Oral)  Pulse: 74 (Regular)  BP: 130/78 (Sitting, Left Arm, Standard)       Physical Exam Marland Kitchen T. Marquisha Nikolov MD; 08/17/2015 3:11 PM) The physical exam findings are as follows: Note:General: Alert, well-developed and well nourished Caucasian female, in no distress Skin: Warm and dry without rash or infection. HEENT: No palpable masses or thyromegaly. Sclera nonicteric. Pupils equal round and reactive. Lymph nodes: No cervical, supraclavicular, or inguinal nodes palpable. Breasts: No palpable masses in either breast. No  palpable axillary adenopathy. Lungs: Breath sounds clear and equal. No wheezing or increased work of breathing. Cardiovascular: Regular rate and rhythm without murmer. No JVD or edema. Abdomen: Nondistended.  Soft and nontender. No masses palpable. No organomegaly. No palpable hernias. Extremities: No edema or joint swelling or deformity. No chronic venous stasis changes. Neurologic: Alert and fully oriented. Gait normal. No focal weakness. Psychiatric: Normal mood and affect. Thought content appropriate with normal judgement and insight    Assessment & Plan Marland Kitchen T. Luvinia Lucy MD; 08/17/2015 3:15 PM) BREAST CANCER, STAGE 1, LEFT (C50.912) Impression: 64 year old female with a new diagnosis of cancer of the left breast, upper outer quadrant. Clinical stage 1A, ER positive, PR positive, HER-2 negative. I discussed with the patient initial surgical treatment options. We discussed options of breast conservation with lumpectomy or total mastectomy and sentinal lymph node biopsy/dissection. Options for reconstruction were discussed. After discussion they have elected to proceed with lumpectomy with axillary sentinel lymph node biopsy. We discussed the indications and nature of the procedure, and expected recovery, in detail. Surgical risks including anesthetic complications, cardiorespiratory complications, bleeding, infection, wound healing complications, blood clots, lymphedema, local and distant recurrence and possible need for further surgery based on the final pathology was discussed and understood. Chemotherapy, hormonal therapy and radiation therapy have been discussed. They have been provided with literature regarding the treatment of breast cancer. All questions were answered. They understand and agree to proceed and we will go ahead with scheduling. Refer to radiation and medical oncology postoperatively. Refer to genetics. Current Plans Referred to Genetic Counseling, for evaluation and follow  up (Medical Genetics). Routine. Referred to Oncology, for evaluation and follow up (Oncology). Routine. Referred to Radiation Oncology, for evaluation and follow up (Radiation Oncology). Routine. Radioactive seed localized left breast lumpectomy with left axillary sentinel lymph node biopsy as an outpatient under general anesthesia  You are being scheduled for surgery - Our schedulers will call you.  You should hear from our office's scheduling department within 5 working days about the location, date, and time of surgery. We try to make accommodations for patient's preferences in scheduling surgery, but sometimes the OR schedule or the surgeon's schedule prevents [place] from making those accommodations.  If you have not heard from our office [phone]) in 5 working days, call the office and ask for your surgeon's nurse.  If you have other questions about your diagnosis, plan, or surgery, call the office and ask for your surgeon's nurse.  Pt Education - [facility] Breast Cancer Information Given - [employer] "Breast Journey" Package

## 2015-08-29 ENCOUNTER — Ambulatory Visit (HOSPITAL_BASED_OUTPATIENT_CLINIC_OR_DEPARTMENT_OTHER)
Admission: RE | Admit: 2015-08-29 | Discharge: 2015-08-29 | Disposition: A | Discharge status: Home / Self Care | Payer: Medicare Other | Source: Ambulatory Visit | Attending: General Surgery | Admitting: General Surgery

## 2015-08-29 ENCOUNTER — Ambulatory Visit
Admission: RE | Admit: 2015-08-29 | Discharge: 2015-08-29 | Disposition: A | Discharge status: Home / Self Care | Payer: Medicare Other | Source: Ambulatory Visit | Attending: General Surgery | Admitting: General Surgery

## 2015-08-29 ENCOUNTER — Encounter (HOSPITAL_BASED_OUTPATIENT_CLINIC_OR_DEPARTMENT_OTHER): Payer: Self-pay | Admitting: Certified Registered"

## 2015-08-29 ENCOUNTER — Encounter (HOSPITAL_BASED_OUTPATIENT_CLINIC_OR_DEPARTMENT_OTHER)
Admission: RE | Disposition: A | Discharge status: Home / Self Care | Payer: Self-pay | Source: Ambulatory Visit | Attending: General Surgery

## 2015-08-29 ENCOUNTER — Ambulatory Visit (HOSPITAL_BASED_OUTPATIENT_CLINIC_OR_DEPARTMENT_OTHER): Payer: Medicare Other | Admitting: Certified Registered"

## 2015-08-29 ENCOUNTER — Encounter (HOSPITAL_COMMUNITY)
Admission: RE | Admit: 2015-08-29 | Discharge: 2015-08-29 | Disposition: A | Discharge status: Home / Self Care | Payer: Medicare Other | Source: Ambulatory Visit | Attending: General Surgery | Admitting: General Surgery

## 2015-08-29 DIAGNOSIS — I10 Essential (primary) hypertension: Secondary | ICD-10-CM | POA: Insufficient documentation

## 2015-08-29 DIAGNOSIS — Z803 Family history of malignant neoplasm of breast: Secondary | ICD-10-CM | POA: Diagnosis not present

## 2015-08-29 DIAGNOSIS — C50912 Malignant neoplasm of unspecified site of left female breast: Secondary | ICD-10-CM

## 2015-08-29 DIAGNOSIS — R928 Other abnormal and inconclusive findings on diagnostic imaging of breast: Secondary | ICD-10-CM | POA: Diagnosis not present

## 2015-08-29 DIAGNOSIS — Z170 Estrogen receptor positive status [ER+]: Secondary | ICD-10-CM | POA: Insufficient documentation

## 2015-08-29 DIAGNOSIS — R079 Chest pain, unspecified: Secondary | ICD-10-CM | POA: Diagnosis not present

## 2015-08-29 DIAGNOSIS — C50412 Malignant neoplasm of upper-outer quadrant of left female breast: Secondary | ICD-10-CM | POA: Diagnosis not present

## 2015-08-29 DIAGNOSIS — C773 Secondary and unspecified malignant neoplasm of axilla and upper limb lymph nodes: Secondary | ICD-10-CM | POA: Diagnosis not present

## 2015-08-29 DIAGNOSIS — G8918 Other acute postprocedural pain: Secondary | ICD-10-CM | POA: Diagnosis not present

## 2015-08-29 DIAGNOSIS — Z9012 Acquired absence of left breast and nipple: Secondary | ICD-10-CM | POA: Diagnosis not present

## 2015-08-29 HISTORY — DX: Other specified postprocedural states: Z98.890

## 2015-08-29 HISTORY — DX: Nausea with vomiting, unspecified: R11.2

## 2015-08-29 HISTORY — PX: BREAST LUMPECTOMY WITH RADIOACTIVE SEED AND SENTINEL LYMPH NODE BIOPSY: SHX6550

## 2015-08-29 SURGERY — BREAST LUMPECTOMY WITH RADIOACTIVE SEED AND SENTINEL LYMPH NODE BIOPSY
Anesthesia: General | Site: Breast | Laterality: Left

## 2015-08-29 MED ORDER — CHLORHEXIDINE GLUCONATE 4 % EX LIQD: 1.0000 "application " | Freq: Once | CUTANEOUS | Status: DC

## 2015-08-29 MED ORDER — HYDROMORPHONE HCL 1 MG/ML IJ SOLN
INTRAMUSCULAR | Status: AC
  Filled 2015-08-29: qty 1

## 2015-08-29 MED ORDER — SCOPOLAMINE 1 MG/3DAYS TD PT72
1.0000 | MEDICATED_PATCH | Freq: Once | TRANSDERMAL | Status: DC | PRN
  Administered 2015-08-29: 1.5 mg via TRANSDERMAL

## 2015-08-29 MED ORDER — MIDAZOLAM HCL 2 MG/2ML IJ SOLN
1.0000 mg | INTRAMUSCULAR | Status: DC | PRN
  Administered 2015-08-29: 2 mg via INTRAVENOUS
  Administered 2015-08-29: 1 mg via INTRAVENOUS

## 2015-08-29 MED ORDER — FENTANYL CITRATE (PF) 100 MCG/2ML IJ SOLN
INTRAMUSCULAR | Status: AC
  Filled 2015-08-29: qty 2

## 2015-08-29 MED ORDER — ATROPINE SULFATE 0.4 MG/ML IJ SOLN
INTRAMUSCULAR | Status: AC
  Filled 2015-08-29: qty 1

## 2015-08-29 MED ORDER — GLYCOPYRROLATE 0.2 MG/ML IJ SOLN: 0.2000 mg | Freq: Once | INTRAMUSCULAR | Status: DC | PRN

## 2015-08-29 MED ORDER — EPHEDRINE SULFATE 50 MG/ML IJ SOLN
INTRAMUSCULAR | Status: AC
  Filled 2015-08-29: qty 1

## 2015-08-29 MED ORDER — CEFAZOLIN SODIUM-DEXTROSE 2-3 GM-% IV SOLR
2.0000 g | INTRAVENOUS | Status: AC
  Administered 2015-08-29: 2 g via INTRAVENOUS

## 2015-08-29 MED ORDER — METHYLENE BLUE 1 % INJ SOLN
INTRAMUSCULAR | Status: AC
  Filled 2015-08-29: qty 10

## 2015-08-29 MED ORDER — MEPERIDINE HCL 25 MG/ML IJ SOLN: 6.2500 mg | INTRAMUSCULAR | Status: DC | PRN

## 2015-08-29 MED ORDER — BUPIVACAINE-EPINEPHRINE (PF) 0.5% -1:200000 IJ SOLN
INTRAMUSCULAR | Status: DC | PRN
  Administered 2015-08-29: 10 mL

## 2015-08-29 MED ORDER — PROPOFOL 500 MG/50ML IV EMUL
INTRAVENOUS | Status: AC
  Filled 2015-08-29: qty 50

## 2015-08-29 MED ORDER — TECHNETIUM TC 99M SULFUR COLLOID FILTERED
1.0000 | Freq: Once | INTRAVENOUS | Status: AC | PRN
  Administered 2015-08-29: 1 via INTRADERMAL

## 2015-08-29 MED ORDER — OXYCODONE HCL 5 MG/5ML PO SOLN: 5.0000 mg | Freq: Once | ORAL | Status: DC | PRN

## 2015-08-29 MED ORDER — ONDANSETRON HCL 4 MG/2ML IJ SOLN
INTRAMUSCULAR | Status: AC
  Filled 2015-08-29: qty 2

## 2015-08-29 MED ORDER — BUPIVACAINE-EPINEPHRINE (PF) 0.5% -1:200000 IJ SOLN
INTRAMUSCULAR | Status: DC | PRN
  Administered 2015-08-29: 2 mL via PERINEURAL

## 2015-08-29 MED ORDER — FENTANYL CITRATE (PF) 100 MCG/2ML IJ SOLN
50.0000 ug | INTRAMUSCULAR | Status: AC | PRN
  Administered 2015-08-29: 50 ug via INTRAVENOUS
  Administered 2015-08-29 (×2): 100 ug via INTRAVENOUS

## 2015-08-29 MED ORDER — BUPIVACAINE-EPINEPHRINE (PF) 0.5% -1:200000 IJ SOLN
INTRAMUSCULAR | Status: AC
  Filled 2015-08-29: qty 30

## 2015-08-29 MED ORDER — LIDOCAINE HCL (CARDIAC) 20 MG/ML IV SOLN
INTRAVENOUS | Status: AC
  Filled 2015-08-29: qty 5

## 2015-08-29 MED ORDER — SUCCINYLCHOLINE CHLORIDE 20 MG/ML IJ SOLN
INTRAMUSCULAR | Status: AC
  Filled 2015-08-29: qty 1

## 2015-08-29 MED ORDER — MIDAZOLAM HCL 2 MG/2ML IJ SOLN
INTRAMUSCULAR | Status: AC
  Filled 2015-08-29: qty 2

## 2015-08-29 MED ORDER — CEFAZOLIN SODIUM-DEXTROSE 2-3 GM-% IV SOLR
INTRAVENOUS | Status: AC
  Filled 2015-08-29: qty 50

## 2015-08-29 MED ORDER — HYDROCODONE-ACETAMINOPHEN 5-325 MG PO TABS: 1.0000 | ORAL_TABLET | ORAL | Status: DC | PRN

## 2015-08-29 MED ORDER — EPHEDRINE SULFATE 50 MG/ML IJ SOLN
INTRAMUSCULAR | Status: DC | PRN
  Administered 2015-08-29 (×2): 10 mg via INTRAVENOUS

## 2015-08-29 MED ORDER — SODIUM CHLORIDE 0.9 % IJ SOLN
INTRAMUSCULAR | Status: DC | PRN
  Administered 2015-08-29: 5 mL via INTRAMUSCULAR

## 2015-08-29 MED ORDER — ONDANSETRON HCL 4 MG/2ML IJ SOLN
INTRAMUSCULAR | Status: DC | PRN
Start: 2015-08-29 — End: 2015-08-29
  Administered 2015-08-29: 4 mg via INTRAVENOUS

## 2015-08-29 MED ORDER — SODIUM CHLORIDE 0.9 % IJ SOLN
INTRAMUSCULAR | Status: AC
  Filled 2015-08-29: qty 10

## 2015-08-29 MED ORDER — LACTATED RINGERS IV SOLN
INTRAVENOUS | Status: DC
  Administered 2015-08-29 (×2): via INTRAVENOUS

## 2015-08-29 MED ORDER — DEXAMETHASONE SODIUM PHOSPHATE 4 MG/ML IJ SOLN
INTRAMUSCULAR | Status: DC | PRN
  Administered 2015-08-29: 10 mg via INTRAVENOUS

## 2015-08-29 MED ORDER — LIDOCAINE HCL (CARDIAC) 20 MG/ML IV SOLN
INTRAVENOUS | Status: DC | PRN
  Administered 2015-08-29: 70 mg via INTRAVENOUS

## 2015-08-29 MED ORDER — OXYCODONE HCL 5 MG PO TABS: 5.0000 mg | ORAL_TABLET | Freq: Once | ORAL | Status: DC | PRN

## 2015-08-29 MED ORDER — HYDROMORPHONE HCL 1 MG/ML IJ SOLN
0.2500 mg | INTRAMUSCULAR | Status: DC | PRN
  Administered 2015-08-29 (×4): 0.5 mg via INTRAVENOUS

## 2015-08-29 MED ORDER — PROPOFOL 10 MG/ML IV BOLUS
INTRAVENOUS | Status: DC | PRN
  Administered 2015-08-29: 200 mg via INTRAVENOUS

## 2015-08-29 MED ORDER — DEXAMETHASONE SODIUM PHOSPHATE 10 MG/ML IJ SOLN
INTRAMUSCULAR | Status: AC
  Filled 2015-08-29: qty 1

## 2015-08-29 SURGICAL SUPPLY — 48 items
APPLIER CLIP 9.375 MED OPEN (MISCELLANEOUS) ×2
APR CLP MED 9.3 20 MLT OPN (MISCELLANEOUS) ×1
BINDER BREAST LRG (GAUZE/BANDAGES/DRESSINGS) IMPLANT
BINDER BREAST XLRG (GAUZE/BANDAGES/DRESSINGS) IMPLANT
BLADE SURG 15 STRL LF DISP TIS (BLADE) ×1 IMPLANT
BLADE SURG 15 STRL SS (BLADE) ×2
CANISTER SUC SOCK COL 7IN (MISCELLANEOUS) IMPLANT
CANISTER SUCT 1200ML W/VALVE (MISCELLANEOUS) IMPLANT
CHLORAPREP W/TINT 26ML (MISCELLANEOUS) ×2 IMPLANT
CLIP APPLIE 9.375 MED OPEN (MISCELLANEOUS) ×1 IMPLANT
COVER BACK TABLE 60X90IN (DRAPES) ×2 IMPLANT
COVER MAYO STAND STRL (DRAPES) ×2 IMPLANT
COVER PROBE W GEL 5X96 (DRAPES) ×2 IMPLANT
DECANTER SPIKE VIAL GLASS SM (MISCELLANEOUS) IMPLANT
DEVICE DUBIN W/COMP PLATE 8390 (MISCELLANEOUS) ×2 IMPLANT
DRAPE LAPAROSCOPIC ABDOMINAL (DRAPES) ×2 IMPLANT
DRAPE UTILITY XL STRL (DRAPES) ×2 IMPLANT
ELECT COATED BLADE 2.86 ST (ELECTRODE) ×2 IMPLANT
ELECT REM PT RETURN 9FT ADLT (ELECTROSURGICAL) ×2
ELECTRODE REM PT RTRN 9FT ADLT (ELECTROSURGICAL) ×1 IMPLANT
GLOVE BIOGEL PI IND STRL 7.0 (GLOVE) IMPLANT
GLOVE BIOGEL PI IND STRL 8 (GLOVE) ×1 IMPLANT
GLOVE BIOGEL PI INDICATOR 7.0 (GLOVE) ×1
GLOVE BIOGEL PI INDICATOR 8 (GLOVE) ×2
GLOVE ECLIPSE 7.5 STRL STRAW (GLOVE) ×2 IMPLANT
GLOVE SURG SYN 7.5  E (GLOVE) ×1
GLOVE SURG SYN 7.5 E (GLOVE) ×1 IMPLANT
GLOVE SURG SYN 7.5 PF PI (GLOVE) IMPLANT
GOWN PREVENTION PLUS XLARGE (GOWN DISPOSABLE) ×2 IMPLANT
ILLUMINATOR WAVEGUIDE N/F (MISCELLANEOUS) IMPLANT
KIT MARKER MARGIN INK (KITS) ×2 IMPLANT
LIQUID BAND (GAUZE/BANDAGES/DRESSINGS) ×2 IMPLANT
NDL HYPO 25X1 1.5 SAFETY (NEEDLE) ×2 IMPLANT
NDL SAFETY ECLIPSE 18X1.5 (NEEDLE) ×1 IMPLANT
NEEDLE HYPO 18GX1.5 SHARP (NEEDLE) ×2
NEEDLE HYPO 25X1 1.5 SAFETY (NEEDLE) ×4 IMPLANT
NS IRRIG 1000ML POUR BTL (IV SOLUTION) IMPLANT
PACK BASIN DAY SURGERY FS (CUSTOM PROCEDURE TRAY) ×2 IMPLANT
PENCIL BUTTON HOLSTER BLD 10FT (ELECTRODE) ×2 IMPLANT
SLEEVE SCD COMPRESS KNEE MED (MISCELLANEOUS) ×2 IMPLANT
SPONGE LAP 4X18 X RAY DECT (DISPOSABLE) ×2 IMPLANT
SUT MON AB 5-0 PS2 18 (SUTURE) ×2 IMPLANT
SUT VICRYL 3-0 CR8 SH (SUTURE) ×2 IMPLANT
SYR CONTROL 10ML LL (SYRINGE) ×4 IMPLANT
TOWEL OR 17X24 6PK STRL BLUE (TOWEL DISPOSABLE) ×2 IMPLANT
TOWEL OR NON WOVEN STRL DISP B (DISPOSABLE) ×1 IMPLANT
TUBE CONNECTING 20X1/4 (TUBING) ×1 IMPLANT
YANKAUER SUCT BULB TIP NO VENT (SUCTIONS) IMPLANT

## 2015-08-29 NOTE — Anesthesia Postprocedure Evaluation (Signed)
Anesthesia Post Note  Patient: Jacqueline Jenkins  Procedure(s) Performed: Procedure(s) (LRB): LEFT BREAST LUMPECTOMY WITH RADIOACTIVE SEED WITH AXILLARY SENTINEL LYMPH NODE BIOPSY (Left)  Patient location during evaluation: PACU Anesthesia Type: General Level of consciousness: awake and alert Pain management: pain level controlled Vital Signs Assessment: post-procedure vital signs reviewed and stable Respiratory status: spontaneous breathing, nonlabored ventilation and respiratory function stable Cardiovascular status: blood pressure returned to baseline and stable Postop Assessment: no signs of nausea or vomiting Anesthetic complications: no    Last Vitals:  Filed Vitals:   08/29/15 1330 08/29/15 1424  BP: 127/68   Pulse: 82 83  Temp:  36.4 C  Resp: 18 20    Last Pain:  Filed Vitals:   08/29/15 1426  PainSc: 2                  Layliana Devins A

## 2015-08-29 NOTE — Anesthesia Procedure Notes (Addendum)
Procedure Name: LMA Insertion Date/Time: 08/29/2015 11:44 AM Performed by: Baxter Flattery Pre-anesthesia Checklist: Patient identified, Emergency Drugs available, Suction available and Patient being monitored Patient Re-evaluated:Patient Re-evaluated prior to inductionOxygen Delivery Method: Circle System Utilized Preoxygenation: Pre-oxygenation with 100% oxygen Intubation Type: IV induction Ventilation: Mask ventilation without difficulty LMA: LMA inserted LMA Size: 4.0 Number of attempts: 1 Airway Equipment and Method: Bite block Placement Confirmation: positive ETCO2 and breath sounds checked- equal and bilateral Tube secured with: Tape Dental Injury: Teeth and Oropharynx as per pre-operative assessment    Anesthesia Regional Block:  Pectoralis block  Pre-Anesthetic Checklist: ,, timeout performed, Correct Patient, Correct Site, Correct Laterality, Correct Procedure, Correct Position, site marked, Risks and benefits discussed,  Surgical consent,  Pre-op evaluation,  At surgeon's request and post-op pain management  Laterality: Left and Upper  Prep: chloraprep       Needles:  Injection technique: Single-shot  Needle Type: Echogenic Needle     Needle Length: 9cm 9 cm Needle Gauge: 21 and 21 G    Additional Needles:  Procedures: ultrasound guided (picture in chart) Pectoralis block Narrative:  Start time: 08/29/2015 11:45 AM End time: 08/29/2015 11:33 AM Injection made incrementally with aspirations every 5 mL.  Performed by: Personally  Anesthesiologist: Kinesha Auten

## 2015-08-29 NOTE — Anesthesia Preprocedure Evaluation (Signed)
Anesthesia Evaluation  Patient identified by MRN, date of birth, ID band Patient awake    Reviewed: Allergy & Precautions, NPO status , Patient's Chart, lab work & pertinent test results  History of Anesthesia Complications (+) PONV  Airway Mallampati: I  TM Distance: >3 FB Neck ROM: Full    Dental  (+) Teeth Intact, Dental Advisory Given   Pulmonary    breath sounds clear to auscultation       Cardiovascular hypertension, Pt. on medications  Rhythm:Regular Rate:Normal     Neuro/Psych    GI/Hepatic   Endo/Other    Renal/GU      Musculoskeletal   Abdominal   Peds  Hematology   Anesthesia Other Findings   Reproductive/Obstetrics                             Anesthesia Physical Anesthesia Plan  ASA: II  Anesthesia Plan: General   Post-op Pain Management: MAC Combined w/ Regional for Post-op pain   Induction: Intravenous  Airway Management Planned: LMA  Additional Equipment:   Intra-op Plan:   Post-operative Plan: Extubation in OR  Informed Consent: I have reviewed the patients History and Physical, chart, labs and discussed the procedure including the risks, benefits and alternatives for the proposed anesthesia with the patient or authorized representative who has indicated his/her understanding and acceptance.   Dental advisory given  Plan Discussed with: CRNA, Anesthesiologist and Surgeon  Anesthesia Plan Comments:         Anesthesia Quick Evaluation

## 2015-08-29 NOTE — Transfer of Care (Signed)
Immediate Anesthesia Transfer of Care Note  Patient: Jacqueline Jenkins  Procedure(s) Performed: Procedure(s): LEFT BREAST LUMPECTOMY WITH RADIOACTIVE SEED WITH AXILLARY SENTINEL LYMPH NODE BIOPSY (Left)  Patient Location: PACU  Anesthesia Type:General  Level of Consciousness: awake, alert  and patient cooperative  Airway & Oxygen Therapy: Patient Spontanous Breathing and Patient connected to face mask oxygen  Post-op Assessment: Report given to RN, Post -op Vital signs reviewed and stable and Patient moving all extremities  Post vital signs: Reviewed and stable  Last Vitals:  Filed Vitals:   08/29/15 1105 08/29/15 1241  BP: 128/63   Pulse: 87 91  Temp:    Resp: 18 44    Complications: No apparent anesthesia complications

## 2015-08-29 NOTE — Interval H&P Note (Signed)
History and Physical Interval Note:  08/29/2015 11:31 AM  Jacqueline Jenkins  has presented today for surgery, with the diagnosis of Left breast cancer  The various methods of treatment have been discussed with the patient and family. After consideration of risks, benefits and other options for treatment, the patient has consented to  Procedure(s): LEFT BREAST LUMPECTOMY WITH RADIOACTIVE SEED WITH AXILLARY SENTINEL LYMPH NODE BIOPSY (Left) as a surgical intervention .  The patient's history has been reviewed, patient examined, no change in status, stable for surgery.  I have reviewed the patient's chart and labs.  Questions were answered to the patient's satisfaction.     [name]

## 2015-08-29 NOTE — Discharge Instructions (Signed)
[facility] °Office Phone Number [phone] ° °BREAST BIOPSY/ PARTIAL MASTECTOMY: POST OP INSTRUCTIONS ° °Always review your discharge instruction sheet given to you by the facility where your surgery was performed. ° °IF YOU HAVE DISABILITY OR FAMILY LEAVE FORMS, YOU MUST BRING THEM TO THE OFFICE FOR PROCESSING.  DO NOT GIVE THEM TO YOUR DOCTOR. ° °1. A prescription for pain medication may be given to you upon discharge.  Take your pain medication as prescribed, if needed.  If narcotic pain medicine is not needed, then you may take acetaminophen (Tylenol) or ibuprofen (Advil) as needed. °2. Take your usually prescribed medications unless otherwise directed °3. If you need a refill on your pain medication, please contact your pharmacy.  They will contact our office to request authorization.  Prescriptions will not be filled after 5pm or on week-ends. °4. You should eat very light the first 24 hours after surgery, such as soup, crackers, pudding, etc.  Resume your normal diet the day after surgery. °5. Most patients will experience some swelling and bruising in the breast.  Ice packs and a good support bra will help.  Swelling and bruising can take several days to resolve.  °6. It is common to experience some constipation if taking pain medication after surgery.  Increasing fluid intake and taking a stool softener will usually help or prevent this problem from occurring.  A mild laxative (Milk of Magnesia or Miralax) should be taken according to package directions if there are no bowel movements after 48 hours. °7. Unless discharge instructions indicate otherwise, you may remove your bandages 24-48 hours after surgery, and you may shower at that time.  You may have steri-strips (small skin tapes) in place directly over the incision.  These strips should be left on the skin for 7-10 days.  If your surgeon used skin glue on the incision, you may shower in 24 hours.  The glue will flake off over the  next 2-3 weeks.  Any sutures or staples will be removed at the office during your follow-up visit. °8. ACTIVITIES:  You may resume regular daily activities (gradually increasing) beginning the next day.  Wearing a good support bra or sports bra minimizes pain and swelling.  You may have sexual intercourse when it is comfortable. °a. You may drive when you no longer are taking prescription pain medication, you can comfortably wear a seatbelt, and you can safely maneuver your car and apply brakes. °b. RETURN TO WORK:  ______________________________________________________________________________________ °9. You should see your doctor in the office for a follow-up appointment approximately two weeks after your surgery.  Your doctor’s nurse will typically make your follow-up appointment when she calls you with your pathology report.  Expect your pathology report 2-3 business days after your surgery.  You may call to check if you do not hear from us after three days. °10. OTHER INSTRUCTIONS: _______________________________________________________________________________________________ _____________________________________________________________________________________________________________________________________ °_____________________________________________________________________________________________________________________________________ °_____________________________________________________________________________________________________________________________________ ° °WHEN TO CALL YOUR DOCTOR: °1. Fever over 101.0 °2. Nausea and/or vomiting. °3. Extreme swelling or bruising. °4. Continued bleeding from incision. °5. Increased pain, redness, or drainage from the incision. ° °The clinic staff is available to answer your questions during regular business hours.  Please don’t hesitate to call and ask to speak to one of the nurses for clinical concerns.  If you have a medical emergency, go to the nearest  emergency room or call 911.  A surgeon from [facility] is always on call at the hospital. ° °For further questions, please visit [email or web address]  ° ° °  Post Anesthesia Home Care Instructions ° °Activity: °Get plenty of rest for the remainder of the day. A responsible adult should stay with you for 24 hours following the procedure.  °For the next 24 hours, DO NOT: °-Drive a car °-Operate machinery °-Drink alcoholic beverages °-Take any medication unless instructed by your physician °-Make any legal decisions or sign important papers. ° °Meals: °Start with liquid foods such as gelatin or soup. Progress to regular foods as tolerated. Avoid greasy, spicy, heavy foods. If nausea and/or vomiting occur, drink only clear liquids until the nausea and/or vomiting subsides. Call your physician if vomiting continues. ° °Special Instructions/Symptoms: °Your throat may feel dry or sore from the anesthesia or the breathing tube placed in your throat during surgery. If this causes discomfort, gargle with warm salt water. The discomfort should disappear within 24 hours. ° °If you had a scopolamine patch placed behind your ear for the management of post- operative nausea and/or vomiting: ° °1. The medication in the patch is effective for 72 hours, after which it should be removed.  Wrap patch in a tissue and discard in the trash. Wash hands thoroughly with soap and water. °2. You may remove the patch earlier than 72 hours if you experience unpleasant side effects which may include dry mouth, dizziness or visual disturbances. °3. Avoid touching the patch. Wash your hands with soap and water after contact with the patch. °  ° °Regional Anesthesia Blocks ° °1. Numbness or the inability to move the "blocked" extremity may last from 3-48 hours after placement. The length of time depends on the medication injected and your individual response to the medication. If the numbness is not going away after 48 hours, call  your surgeon. ° °2. The extremity that is blocked will need to be protected until the numbness is gone and the  Strength has returned. Because you cannot feel it, you will need to take extra care to avoid injury. Because it may be weak, you may have difficulty moving it or using it. You may not know what position it is in without looking at it while the block is in effect. ° °3. For blocks in the legs and feet, returning to weight bearing and walking needs to be done carefully. You will need to wait until the numbness is entirely gone and the strength has returned. You should be able to move your leg and foot normally before you try and bear weight or walk. You will need someone to be with you when you first try to ensure you do not fall and possibly risk injury. ° °4. Bruising and tenderness at the needle site are common side effects and will resolve in a few days. ° °5. Persistent numbness or new problems with movement should be communicated to the surgeon or the [facility] ([phone])/ [facility] ([phone]). ° °

## 2015-08-29 NOTE — Op Note (Signed)
Preoperative Diagnosis: Left breast cancer  Postoprative Diagnosis: Left breast cancer  Procedure: Procedure(s): Blue dye injections left breast, LEFT BREAST LUMPECTOMY WITH RADIOACTIVE SEED WITH AXILLARY SENTINEL LYMPH NODE BIOPSY   Surgeon: Excell Seltzer T   Assistants: none  Anesthesia:  General LMA anesthesia  Indications: patient is a 64 year old female with a recent abnormal screening mammogram of the left breast and large core needle biopsy showing invasive ductal carcinoma, 6 mm, clinical stage I. After extensive discussion detailed elsewhere we have elected to proceed with radioactive seed localized left breast lumpectomy and left axillary sentinel lymph node biopsy is initial surgical treatment.    Procedure Detail:  Patient had previously undergone accurate placement of a radioactive seed at the tumor and clip site in the upper outer left breast. The seed was confirmed with the neoprobe in the holding area. Patient was taken to the operating room, placed in the supine position on the operating table, and laryngeal mask general anesthesia induced. Under sterile technique after patient timeout  I injected 5 mL of dilute methylene blue subcutaneously beneath the left nipple and massaged this for several minutes. Patient received preoperative IV antibiotics. PAS weren't place. The left breast and axilla and upper arm were widely sterilely prepped and draped. Patient timeout was again performed and correct procedure verified. A curvilinear incision was made at the areolar border in the upper outer left breast. Dissection was carried down through the subcutaneous tissue and short skin and subcutaneous flaps raised. Using the neoprobe for guidance I excised a moderate amount of breast tissue surrounding the area of high counts using cautery. The specimen was oriented with ink. Specimen x-ray showed the seed and marking clip centrally located within the specimen. Complete hemostasis was  obtained. The deep breast and subcutaneous tissue was closed with interrupted 3-0 Vicryl after marking the lumpectomy cavity with clips. Attention was then turned to the sentinel lymph node biopsy. A area of elevated counts in the left axilla was identified and a small transverse incision made. Dissection was carried down through the subcutaneous tissue with cautery and the clavipectoral fascia incised. I was able to locate a bright blue lymphatic and traced this down to a slightly enlarged bright blue lymph node with elevated counts. This was completely excised as hot blue left axillary sentinel lymph node. Following this there were negligible counts in the axilla and I did not identify any further blue dye or palpable lymph nodes. Hemostasis was assured in the deep and subcutaneous tissue closed with interrupted 3-0 Vicryl. Skin was infiltrated with Marcaine and the skin incisions closed with subcuticular 5-0 Monocryl and Liquiban. Sponge needle and instrument counts were correct.    Findings: As above  Estimated Blood Loss:  Minimal         Drains: nnone  Blood Given: none          Specimens: #1 left breast lumpectomy oriented with ink      #2 hot blue left axillary sentinel lymph node        Complications:  * No complications entered in OR log *         Disposition: PACU - hemodynamically stable.         Condition: stable

## 2015-08-29 NOTE — Progress Notes (Signed)
Radiology staff performed nuc med inj. Additional sedation given (per dr crews' order). Pt tol well, VSS. Will call daughter Vinnie Level) back and update/provide emotional support

## 2015-08-29 NOTE — Progress Notes (Signed)
Assisted Dr. Crews with left, ultrasound guided, pectoralis block. Side rails up, monitors on throughout procedure. See vital signs in flow sheet. Tolerated Procedure well. 

## 2015-08-30 ENCOUNTER — Other Ambulatory Visit: Payer: Medicare Other

## 2015-08-30 ENCOUNTER — Ambulatory Visit (HOSPITAL_BASED_OUTPATIENT_CLINIC_OR_DEPARTMENT_OTHER): Payer: Medicare Other | Admitting: Genetic Counselor

## 2015-08-30 ENCOUNTER — Encounter (HOSPITAL_BASED_OUTPATIENT_CLINIC_OR_DEPARTMENT_OTHER): Payer: Self-pay | Admitting: General Surgery

## 2015-08-30 DIAGNOSIS — Z800 Family history of malignant neoplasm of digestive organs: Secondary | ICD-10-CM

## 2015-08-30 DIAGNOSIS — Z803 Family history of malignant neoplasm of breast: Secondary | ICD-10-CM

## 2015-08-30 DIAGNOSIS — C50412 Malignant neoplasm of upper-outer quadrant of left female breast: Secondary | ICD-10-CM | POA: Diagnosis not present

## 2015-08-30 DIAGNOSIS — Z806 Family history of leukemia: Secondary | ICD-10-CM | POA: Diagnosis not present

## 2015-08-30 DIAGNOSIS — Z809 Family history of malignant neoplasm, unspecified: Secondary | ICD-10-CM

## 2015-08-30 DIAGNOSIS — Z315 Encounter for genetic counseling: Secondary | ICD-10-CM | POA: Diagnosis not present

## 2015-08-30 HISTORY — DX: Family history of malignant neoplasm of digestive organs: Z80.0

## 2015-08-30 HISTORY — DX: Family history of malignant neoplasm of breast: Z80.3

## 2015-08-30 NOTE — Progress Notes (Signed)
REFERRING PROVIDER: Excell Seltzer, MD  OTHER PROVIDER(S): Jacqueline Lose, MD  PRIMARY PROVIDER:  Alesia Richards, MD  PRIMARY REASON FOR VISIT:  1. Breast cancer of upper-outer quadrant of left female breast ([place])   2. Family history of breast cancer   3. Family history of pancreatic cancer   4. Family history of colon cancer   5. Family history of leukemia   6. Family history of cancer      HISTORY OF PRESENT ILLNESS:   Jacqueline Jenkins, a 64 y.o. female, was seen for a [place] cancer genetics consultation at the request of Dr. Excell Jenkins due to a personal history of breast cancer and family history of early-onset breast and other cancers.  Jacqueline Jenkins presents to clinic today to discuss the possibility of a hereditary predisposition to cancer, genetic testing, and to further clarify her future cancer risks, as well as potential cancer risks for family members.   In December 2016, at the age of 51, Jacqueline Jenkins was diagnosed with invasive ductal carcinoma of the left breast.  Hormone receptor status was ER/PR+, Her2-.  This was treated with left lumpectomy, then radiation and anti-estrogen therapy.  An oncotype score was ordered to determine whether she will need chemotherapy.  Jacqueline Jenkins has no additional cancer history.  Genetic test results will help inform future screening and further surgical/treatment decisions.   CANCER HISTORY:    Breast cancer of upper-outer quadrant of left female breast ([place])   08/03/2015 Mammogram Left Breast: 6 mm mass @ 1 o clock   08/09/2015 Initial Diagnosis Left breast 1:00 position: Invasive ductal carcinoma grade 1-2, ER 100%, PR 10%, HER-2 negative ratio 1.79, Ki-67 15%, T1 BN 0 stage I a clinical stage     HORMONAL RISK FACTORS:  Menarche was at age 54.  First live birth at age 35.  OCP use for approximately 1 years.  Ovaries intact: yes.  Hysterectomy: no.  Menopausal status: postmenopausal.  HRT use: 0 years. Colonoscopy: yes; normal,  though some diverticulosis. Mammogram within the last year: yes. Number of breast biopsies: 1. Up to date with pelvic exams:  yes. Any excessive radiation exposure in the past:  Reports previous history of several CAT scans for a splenic aneurysm and this is still being monitored; also reports some secondhand smoke exposure in the past  Past Medical History  Diagnosis Date  . Hypertension   . Hyperlipidemia   . DDD (degenerative disc disease)   . Vitamin D deficiency   . Allergic rhinitis, cause unspecified   . Osteopenia 12/2011    Spine T -0.4, Femur -2.1  . PONV (postoperative nausea and vomiting)   . Splenic artery aneurysm [facility])     Past Surgical History  Procedure Laterality Date  . Tonsillectomy    . Spine surgery  1998    cervical diskectomy  . Spine surgery  2001, 2010    Lumbar diskectomy  . Cervical disc surgery  1998  . Lumbar disc surgery  2001, 2010  . Splenic aneurysm  02/10/2012    Aneurysm of splenic artery  . Colonoscopy  Jan. 7, 2014  . Visceral angiogram N/A 02/10/2012    Procedure: VISCERAL ANGIOGRAM;  Surgeon: Jacqueline Mitchell, MD;  Location: [facility] CATH LAB;  Service: Cardiovascular;  Laterality: N/A;  . Breast lumpectomy with radioactive seed and sentinel lymph node biopsy Left 08/29/2015    Procedure: LEFT BREAST LUMPECTOMY WITH RADIOACTIVE SEED WITH AXILLARY SENTINEL LYMPH NODE BIOPSY;  Surgeon: Jacqueline Seltzer, MD;  Location: [facility]  [place];  Service: General;  Laterality: Left;    Social History   Social History  . Marital Status: Married    Spouse Name: N/A  . Number of Children: N/A  . Years of Education: N/A   Social History Main Topics  . Smoking status: Never Smoker   . Smokeless tobacco: Never Used  . Alcohol Use: Yes     Comment: 3-4 times per year  . Drug Use: No  . Sexual Activity: Not on file   Other Topics Concern  . Not on file   Social History Narrative     FAMILY HISTORY:  We obtained a detailed, 4-generation  family history.  Significant diagnoses are listed below: Family History  Problem Relation Age of Onset  . Stroke Mother   . Osteoporosis Mother   . Hyperlipidemia Mother   . Hypertension Mother   . Other Mother     varicose veins  . Deep vein thrombosis Mother   . Diverticulitis Mother   . Breast cancer Maternal Grandmother     dx. 30s-40s; when pt's mother was 76 years old  . Other Father     bleeding problems  . Hypertension Father   . Heart attack Father   . Stroke Father   . Other Sister     one sister had a hysterectomy for fibroids  . Leukemia Maternal Aunt     dx. 50s-60s  . Pancreatic cancer Maternal Uncle     dx. late 60s-early 70s; (x2 maternal uncles)  . Heart attack Paternal Aunt   . Stroke Paternal Aunt   . Stroke Maternal Grandfather   . Heart attack Paternal Grandmother   . Stroke Paternal Grandmother   . Heart Problems Paternal Grandfather   . Colon cancer Maternal Uncle     dx. late 22s  . Heart defect Maternal Aunt     possible  . Breast cancer Cousin     dx. 50s-60s; maternal first  . Heart attack Paternal Uncle   . Stroke Paternal Uncle     Jacqueline Jenkins has two daughters, ages 6 and 52, neither of whom have ever been diagnosed with cancer.  She has one grandson and two granddaughters.  She has two full sisters and one full brother--all between the ages of 37 and 66.  None of her siblings have ever been diagnosed with cancer, but she reports that one sister has a history of a hysterectomy for fibroids.  Jacqueline Jenkins mother died of a massive stroke at the age of 75.  She had no known cancer diagnoses or any known colon polyps, but Jacqueline Jenkins does report that she had a history of diverticulitis.  Jacqueline Jenkins father died a either a heart attack or a stroke at the age of 23.  He had poor overall health and experienced GI bleeding, which [name] attributes to his time spent in WW[place].    Jacqueline Jenkins mother had four full brothers and two full sisters.  Two of the  maternal uncles died of pancreatic cancer in their late 60s-early 70s.  They were both smokers.  One of these uncles had one son who died in the [place] War at 30 years of age.  The other uncle has one daughter who has lots of health problems per Jacqueline Jenkins's report, but who has never been diagnosed with cancer to Jacqueline Jenkins's knowledge.  Another maternal uncle had a history of colon cancer diagnosed in his 63s.  He had no children.  Only  one uncle was never diagnosed with cancer and he passed away in his 41s.  This uncle had one son and one daughter, and his daughter was diagnosed with breast cancer in her 30s-60s.  One maternal aunt was diagnosed with leukemia in her 48s-60s.  The second maternal aunt passed away in her 77s-40s from, what Jacqueline Jenkins believes may have been a heart defect that was present from birth.  Jacqueline Jenkins maternal grandmother died of breast cancer in her 73s-40s.  Jacqueline Jenkins estimates this age, but knows that her mom was about 19 years old when she passed away.  Jacqueline Jenkins maternal grandfather died of a stroke in his 87s-80s.  Jacqueline Jenkins has no further information for any maternal great aunts/uncles or great grandparents.  Jacqueline Jenkins father had two full sisters and three full brothers.  Only one of these siblings, a brother, had a history of cancer.  He was diagnosed with an unspecified type of cancer, for which Jacqueline Jenkins has no additional information.  He passed away at the age of 61 and she reports that he had a history of GI bleeding as well.  The remaining paternal aunts and uncles died in their 6s-90s, mostly of either heart attacks or strokes.  Jacqueline Jenkins paternal aunts never had any children and she has no further information for any paternal first cousins through her uncles.  Ms. Caison paternal grandmother died of either a heart attack or stroke in her 31s.  Ms. Gibby paternal grandfather died of a heart-related illness or stroke at the age of 36.  Ms. Hoaglund has no further  information for any paternal great aunts/uncles or great grandparents.    Ms. Meland is unaware of any previous genetic testing for any family members.  Patient's maternal and paternal ancestors are of [place] descent. There is no reported Ashkenazi Jewish ancestry. There is no known consanguinity.  GENETIC COUNSELING ASSESSMENT: NYREE APPLEGATE is a 64 y.o. female with a personal and family history of cancer which is somewhat suggestive of a hereditary cancer syndrome and predisposition to cancer. We, therefore, discussed and recommended the following at today's visit.   DISCUSSION: We reviewed the characteristics, features and inheritance patterns of hereditary cancer syndromes, particularly those caused by mutations in the BRCA1/2, PALB2, and Lynch syndrome genes. We also discussed genetic testing, including the appropriate family members to test, the process of testing, insurance coverage and turn-around-time for results. We discussed the implications of a negative, positive and/or variant of uncertain significant result. We recommended Ms. Dolin pursue genetic testing for the 20-gene Breast/Ovarian Cancer Panel through [facility].  The Breast/Ovarian Cancer Panel offered by [facility] Hope Pigeon, MD) includes sequencing and deletion/duplication analysis for the following 19 genes:  ATM, BARD1, BRCA1, BRCA2, BRIP1, CDH1, CHEK2, FANCC, MLH1, MSH2, MSH6, NBN, PALB2, PMS2, PTEN, RAD51C, RAD51D, TP53, and XRCC2.  This panel also includes deletion/duplication analysis (without sequencing) for one gene, EPCAM.  Based on Ms. Theys's personal and family history of cancer, she meets medical criteria for genetic testing. Despite that she meets criteria, she may still have an out of pocket cost. We discussed that if her out of pocket cost for testing is over $100, the laboratory will call and confirm whether she wants to proceed with testing.  If the out of pocket cost of testing is less than  $100 she will be billed by the genetic testing laboratory.   PLAN: After considering the risks, benefits, and limitations, Ms. Herdt  provided informed consent to  pursue genetic testing and the blood sample was sent to [facility] for analysis of the 20-gene Breast/Ovarian Cancer Panel test. Results should be available within approximately 2-3 weeks' time, at which point they will be disclosed by telephone to Ms. Upperman, as will any additional recommendations warranted by these results. Ms. Lybrand will receive a summary of her genetic counseling visit and a copy of her results once available. This information will also be available in Epic. We encouraged Ms. Kutter to remain in contact with cancer genetics annually so that we can continuously update the family history and inform her of any changes in cancer genetics and testing that may be of benefit for her family. Ms. Vandevender questions were answered to her satisfaction today. Our contact information was provided should additional questions or concerns arise.  Thank you for the referral and allowing [place] to share in the care of your patient.   Jeanine Luz, MS Genetic Counselor [email or web address] Phone: [phone]  The patient was seen for a total of 60 minutes in face-to-face genetic counseling.  This patient was discussed with Drs. Magrinat, Lindi Adie and/or Burr Medico who agrees with the above.    _______________________________________________________________________ For Office Staff:  Number of people involved in session: 1 Was an Intern/ student involved with case: no

## 2015-08-30 NOTE — Addendum Note (Signed)
Addendum  created 08/30/15 1134 by Tawni Millers, CRNA   Modules edited: Charges VN

## 2015-08-31 ENCOUNTER — Telehealth: Payer: Self-pay | Admitting: Genetic Counselor

## 2015-08-31 ENCOUNTER — Encounter: Payer: Self-pay | Admitting: Genetic Counselor

## 2015-08-31 ENCOUNTER — Other Ambulatory Visit: Payer: Self-pay | Admitting: General Surgery

## 2015-08-31 LAB — POCT HEMOGLOBIN-HEMACUE: Hemoglobin: 15 g/dL (ref 12.0–15.0)

## 2015-08-31 NOTE — Telephone Encounter (Signed)
Jacqueline Jenkins called to let me know that one of her maternal uncles had lung cancer and that her maternal grandmother died of breast cancer at the age of 4.  Let her know that this if helpful information and I will add it to her chart.  We will proceed with the same genetic testing order.

## 2015-09-04 ENCOUNTER — Encounter (HOSPITAL_BASED_OUTPATIENT_CLINIC_OR_DEPARTMENT_OTHER): Payer: Self-pay | Admitting: *Deleted

## 2015-09-04 NOTE — Assessment & Plan Note (Signed)
Left lumpectomy 08/29/2015: invasive ductal carcinoma 1.2 cm with LVID, with DCIS intermediate grade, 0/1 sentinel nodes negative, ER 100%, PR 10%, HER-2 negative ratio 1.79, Ki-67 15% , margins negative  T1 cN0 stage IA  Pathology counseling: I discussed the final pathology report of the patient provided  a copy of this report. I discussed the margins as well as lymph node surgeries. We also discussed the final staging along with previously performed ER/PR and HER-2/neu testing.  Recommendation: 1. Oncotype DX testing to determine if chemotherapy would be of any benefit if the final tumor size is greater than 5 mm) followed by 2. Adjuvant radiation therapy followed by 3. Adjuvant antiestrogen therapy   return to clinic based upon Oncotype DX test results

## 2015-09-05 ENCOUNTER — Encounter: Payer: Self-pay | Admitting: Hematology and Oncology

## 2015-09-05 ENCOUNTER — Ambulatory Visit (HOSPITAL_BASED_OUTPATIENT_CLINIC_OR_DEPARTMENT_OTHER): Payer: Medicare Other | Admitting: Hematology and Oncology

## 2015-09-05 VITALS — BP 146/60 | HR 80 | Temp 98.5°F | Resp 18 | Ht 65.0 in | Wt 168.5 lb

## 2015-09-05 DIAGNOSIS — C50412 Malignant neoplasm of upper-outer quadrant of left female breast: Secondary | ICD-10-CM | POA: Diagnosis not present

## 2015-09-05 NOTE — Addendum Note (Signed)
Addended by: Prentiss Bells on: 09/05/2015 07:01 PM   Modules accepted: Medications

## 2015-09-05 NOTE — Progress Notes (Signed)
Patient Care Team: Unk Pinto, MD as PCP - General (Internal Medicine) Ralene Bathe, MD as Consulting Physician (Ophthalmology) Serafina Mitchell, MD as Consulting Physician (Vascular Surgery) Carol Ada, MD as Consulting Physician (Gastroenterology) Amy Martinique, MD as Consulting Physician (Dermatology) Nicholas Lose, MD as Consulting Physician (Hematology and Oncology) Excell Seltzer, MD as Consulting Physician (General Surgery)  DIAGNOSIS: No matching staging information was found for the patient.  SUMMARY OF ONCOLOGIC HISTORY:   Breast cancer of upper-outer quadrant of left female breast ([place])   08/03/2015 Mammogram Left Breast: 6 mm mass @ 1 o clock   08/09/2015 Initial Diagnosis Left breast 1:00 position: Invasive ductal carcinoma grade 1-2, ER 100%, PR 10%, HER-2 negative ratio 1.79, Ki-67 15%, T1 BN 0 stage I a clinical stage   08/29/2015 Surgery  left lumpectomy: invasive ductal carcinoma 1.2 cm with LVID, with DCIS intermediate grade, 0/1 sentinel nodes negative, ER 100%, PR 10%, HER-2 negative ratio 1.79, Ki-67 15% , margins negative    CHIEF COMPLIANT: follow-up after lumpectomy  INTERVAL HISTORY: Jacqueline Jenkins is a 65 year old with above-mentioned history of left breast cancer treated with lumpectomy and is here to discuss the pathology report. She has recovered very well from surgery with some minor discomfort in the breast. The final tumor size came back as 1.2 cm with lymphovascular invasion. One sentinel lymph node was negative. Previously ER/PR positive HER-2 negative. Margins are negative.  REVIEW OF SYSTEMS:   Constitutional: Denies fevers, chills or abnormal weight loss Eyes: Denies blurriness of vision Ears, nose, mouth, throat, and face: Denies mucositis or sore throat Respiratory: Denies cough, dyspnea or wheezes Cardiovascular: Denies palpitation, chest discomfort Gastrointestinal:  Denies nausea, heartburn or change in bowel habits Skin: Denies  abnormal skin rashes Lymphatics: Denies new lymphadenopathy or easy bruising Neurological:Denies numbness, tingling or new weaknesses Behavioral/Psych: Mood is stable, no new changes  Extremities: No lower extremity edema Breast: mild discomfort in the breast from recent surgery All other systems were reviewed with the patient and are negative.  I have reviewed the past medical history, past surgical history, social history and family history with the patient and they are unchanged from previous note.  ALLERGIES:  is allergic to lescol; lipitor; and vytorin.  MEDICATIONS:  Current Outpatient Prescriptions  Medication Sig Dispense Refill  . aspirin 81 MG tablet Take 81 mg by mouth daily.    . benazepril-hydrochlorthiazide (LOTENSIN HCT) 20-12.5 MG tablet TAKE 1 TABLET BY MOUTH DAILY. 90 tablet 1  . Calcium Carbonate-Vitamin D (CALCIUM + D PO) Take 1 tablet by mouth 2 (two) times daily.    . Flaxseed, Linseed, (FLAXSEED OIL) 1000 MG CAPS Take 1,000 mg by mouth daily.    . fluticasone (FLONASE) 50 MCG/ACT nasal spray Place 1 spray into both nostrils as needed for allergies or rhinitis.    Marland Kitchen HYDROcodone-acetaminophen (NORCO/VICODIN) 5-325 MG tablet Take 1-2 tablets by mouth every 4 (four) hours as needed for moderate pain or severe pain. 30 tablet 0  . Multiple Vitamin (MULTIVITAMIN) tablet Take 1 tablet by mouth daily.    . Omega-3 Fatty Acids (FISH OIL) 1200 MG CAPS Take by mouth.     No current facility-administered medications for this visit.    PHYSICAL EXAMINATION: ECOG PERFORMANCE STATUS: 1 - Symptomatic but completely ambulatory  Filed Vitals:   09/05/15 1312  BP: 146/60  Pulse: 80  Temp: 98.5 F (36.9 C)  Resp: 18   Filed Weights   09/05/15 1312  Weight: 168 lb 8 oz (76.431 kg)  GENERAL:alert, no distress and comfortable SKIN: skin color, texture, turgor are normal, no rashes or significant lesions EYES: normal, Conjunctiva are pink and non-injected, sclera  clear OROPHARYNX:no exudate, no erythema and lips, buccal mucosa, and tongue normal  NECK: supple, thyroid normal size, non-tender, without nodularity LYMPH:  no palpable lymphadenopathy in the cervical, axillary or inguinal LUNGS: clear to auscultation and percussion with normal breathing effort HEART: regular rate & rhythm and no murmurs and no lower extremity edema ABDOMEN:abdomen soft, non-tender and normal bowel sounds MUSCULOSKELETAL:no cyanosis of digits and no clubbing  NEURO: alert & oriented x 3 with fluent speech, no focal motor/sensory deficits EXTREMITIES: No lower extremity edema  LABORATORY DATA:  I have reviewed the data as listed   Chemistry      Component Value Date/Time   NA 135 08/24/2015 1205   K 4.2 08/24/2015 1205   CL 102 08/24/2015 1205   CO2 26 08/24/2015 1205   BUN 8 08/24/2015 1205   CREATININE 0.62 08/24/2015 1205   CREATININE 0.62 06/07/2015 1602      Component Value Date/Time   CALCIUM 9.3 08/24/2015 1205   ALKPHOS 58 06/07/2015 1602   AST 22 06/07/2015 1602   ALT 21 06/07/2015 1602   BILITOT 0.6 06/07/2015 1602       Lab Results  Component Value Date   WBC 6.4 06/07/2015   HGB 15.0 08/29/2015   HCT 40.1 06/07/2015   MCV 91.8 06/07/2015   PLT 293 06/07/2015   NEUTROABS 4.1 06/07/2015     ASSESSMENT & PLAN:  Breast cancer of upper-outer quadrant of left female breast ([place]) Left lumpectomy 08/29/2015: invasive ductal carcinoma 1.2 cm with LVID, with DCIS intermediate grade, 0/1 sentinel nodes negative, ER 100%, PR 10%, HER-2 negative ratio 1.79, Ki-67 15% , margins negative  T1 cN0 stage IA  Pathology counseling: I discussed the final pathology report of the patient provided  a copy of this report. I discussed the margins as well as lymph node surgeries. We also discussed the final staging along with previously performed ER/PR and HER-2/neu testing.  Recommendation: 1. Oncotype DX testing to determine if chemotherapy would be of any  benefit followed by 2. Adjuvant radiation therapy followed by 3. Adjuvant antiestrogen therapy  Return to clinic based upon Oncotype DX test results  No orders of the defined types were placed in this encounter.   The patient has a good understanding of the overall plan. she agrees with it. she will call with any problems that may develop before the next visit here.   Rulon Eisenmenger, MD 09/05/2015

## 2015-09-06 ENCOUNTER — Ambulatory Visit: Payer: Medicare Other

## 2015-09-06 ENCOUNTER — Ambulatory Visit: Payer: Medicare Other | Admitting: Radiation Oncology

## 2015-09-07 ENCOUNTER — Ambulatory Visit (HOSPITAL_BASED_OUTPATIENT_CLINIC_OR_DEPARTMENT_OTHER): Payer: Medicare Other | Admitting: Anesthesiology

## 2015-09-07 ENCOUNTER — Encounter (HOSPITAL_BASED_OUTPATIENT_CLINIC_OR_DEPARTMENT_OTHER): Payer: Self-pay

## 2015-09-07 ENCOUNTER — Encounter (HOSPITAL_BASED_OUTPATIENT_CLINIC_OR_DEPARTMENT_OTHER)
Admission: RE | Disposition: A | Discharge status: Home / Self Care | Payer: Self-pay | Source: Ambulatory Visit | Attending: General Surgery

## 2015-09-07 ENCOUNTER — Ambulatory Visit (HOSPITAL_BASED_OUTPATIENT_CLINIC_OR_DEPARTMENT_OTHER)
Admission: RE | Admit: 2015-09-07 | Discharge: 2015-09-07 | Disposition: A | Discharge status: Home / Self Care | Payer: Medicare Other | Source: Ambulatory Visit | Attending: General Surgery | Admitting: General Surgery

## 2015-09-07 DIAGNOSIS — Z803 Family history of malignant neoplasm of breast: Secondary | ICD-10-CM | POA: Diagnosis not present

## 2015-09-07 DIAGNOSIS — I10 Essential (primary) hypertension: Secondary | ICD-10-CM | POA: Diagnosis not present

## 2015-09-07 DIAGNOSIS — Z170 Estrogen receptor positive status [ER+]: Secondary | ICD-10-CM | POA: Insufficient documentation

## 2015-09-07 DIAGNOSIS — Z79899 Other long term (current) drug therapy: Secondary | ICD-10-CM | POA: Insufficient documentation

## 2015-09-07 DIAGNOSIS — Z7982 Long term (current) use of aspirin: Secondary | ICD-10-CM | POA: Insufficient documentation

## 2015-09-07 DIAGNOSIS — C50412 Malignant neoplasm of upper-outer quadrant of left female breast: Secondary | ICD-10-CM | POA: Diagnosis not present

## 2015-09-07 DIAGNOSIS — C50912 Malignant neoplasm of unspecified site of left female breast: Secondary | ICD-10-CM | POA: Diagnosis not present

## 2015-09-07 DIAGNOSIS — N6092 Unspecified benign mammary dysplasia of left breast: Secondary | ICD-10-CM | POA: Insufficient documentation

## 2015-09-07 HISTORY — PX: RE-EXCISION OF BREAST LUMPECTOMY: SHX6048

## 2015-09-07 SURGERY — EXCISION, LESION, BREAST
Anesthesia: General | Site: Breast | Laterality: Left

## 2015-09-07 MED ORDER — LACTATED RINGERS IV SOLN
INTRAVENOUS | Status: DC
  Administered 2015-09-07 (×2): via INTRAVENOUS

## 2015-09-07 MED ORDER — FENTANYL CITRATE (PF) 100 MCG/2ML IJ SOLN: 25.0000 ug | INTRAMUSCULAR | Status: DC | PRN

## 2015-09-07 MED ORDER — DEXAMETHASONE SODIUM PHOSPHATE 10 MG/ML IJ SOLN
INTRAMUSCULAR | Status: AC
  Filled 2015-09-07: qty 1

## 2015-09-07 MED ORDER — ONDANSETRON HCL 4 MG/2ML IJ SOLN
INTRAMUSCULAR | Status: AC
  Filled 2015-09-07: qty 2

## 2015-09-07 MED ORDER — LIDOCAINE HCL (CARDIAC) 20 MG/ML IV SOLN
INTRAVENOUS | Status: DC | PRN
  Administered 2015-09-07: 60 mg via INTRAVENOUS

## 2015-09-07 MED ORDER — GLYCOPYRROLATE 0.2 MG/ML IJ SOLN: 0.2000 mg | Freq: Once | INTRAMUSCULAR | Status: DC | PRN

## 2015-09-07 MED ORDER — PROPOFOL 10 MG/ML IV BOLUS
INTRAVENOUS | Status: DC | PRN
  Administered 2015-09-07: 200 mg via INTRAVENOUS

## 2015-09-07 MED ORDER — SCOPOLAMINE 1 MG/3DAYS TD PT72: 1.0000 | MEDICATED_PATCH | Freq: Once | TRANSDERMAL | Status: DC | PRN

## 2015-09-07 MED ORDER — PROMETHAZINE HCL 25 MG/ML IJ SOLN: 6.2500 mg | INTRAMUSCULAR | Status: DC | PRN

## 2015-09-07 MED ORDER — MIDAZOLAM HCL 2 MG/2ML IJ SOLN: 1.0000 mg | INTRAMUSCULAR | Status: DC | PRN

## 2015-09-07 MED ORDER — BUPIVACAINE-EPINEPHRINE 0.5% -1:200000 IJ SOLN
INTRAMUSCULAR | Status: DC | PRN
  Administered 2015-09-07: 10 mL

## 2015-09-07 MED ORDER — EPHEDRINE SULFATE 50 MG/ML IJ SOLN
INTRAMUSCULAR | Status: DC | PRN
  Administered 2015-09-07: 10 mg via INTRAVENOUS

## 2015-09-07 MED ORDER — CHLORHEXIDINE GLUCONATE 4 % EX LIQD: 1.0000 "application " | Freq: Once | CUTANEOUS | Status: DC

## 2015-09-07 MED ORDER — ONDANSETRON HCL 4 MG/2ML IJ SOLN
INTRAMUSCULAR | Status: DC | PRN
  Administered 2015-09-07: 4 mg via INTRAVENOUS

## 2015-09-07 MED ORDER — FENTANYL CITRATE (PF) 100 MCG/2ML IJ SOLN
50.0000 ug | INTRAMUSCULAR | Status: AC | PRN
  Administered 2015-09-07: 25 ug via INTRAVENOUS
  Administered 2015-09-07: 50 ug via INTRAVENOUS
  Administered 2015-09-07: 25 ug via INTRAVENOUS

## 2015-09-07 MED ORDER — FENTANYL CITRATE (PF) 100 MCG/2ML IJ SOLN
INTRAMUSCULAR | Status: AC
  Filled 2015-09-07: qty 2

## 2015-09-07 MED ORDER — LIDOCAINE HCL (CARDIAC) 20 MG/ML IV SOLN
INTRAVENOUS | Status: AC
  Filled 2015-09-07: qty 5

## 2015-09-07 MED ORDER — CEFAZOLIN SODIUM-DEXTROSE 2-3 GM-% IV SOLR
INTRAVENOUS | Status: AC
  Filled 2015-09-07: qty 50

## 2015-09-07 MED ORDER — DEXAMETHASONE SODIUM PHOSPHATE 4 MG/ML IJ SOLN
INTRAMUSCULAR | Status: DC | PRN
  Administered 2015-09-07: 10 mg via INTRAVENOUS

## 2015-09-07 MED ORDER — CEFAZOLIN SODIUM-DEXTROSE 2-3 GM-% IV SOLR
2.0000 g | INTRAVENOUS | Status: AC
  Administered 2015-09-07: 2 g via INTRAVENOUS

## 2015-09-07 SURGICAL SUPPLY — 38 items
BINDER BREAST XLRG (GAUZE/BANDAGES/DRESSINGS) ×1 IMPLANT
BLADE SURG 15 STRL LF DISP TIS (BLADE) ×1 IMPLANT
BLADE SURG 15 STRL SS (BLADE) ×2
CANISTER SUCT 1200ML W/VALVE (MISCELLANEOUS) ×2 IMPLANT
CHLORAPREP W/TINT 26ML (MISCELLANEOUS) ×2 IMPLANT
CLIP TI WIDE RED SMALL 6 (CLIP) ×2 IMPLANT
COVER BACK TABLE 60X90IN (DRAPES) ×2 IMPLANT
COVER MAYO STAND STRL (DRAPES) ×2 IMPLANT
DRAPE LAPAROSCOPIC ABDOMINAL (DRAPES) ×2 IMPLANT
DRAPE UTILITY XL STRL (DRAPES) ×2 IMPLANT
ELECT COATED BLADE 2.86 ST (ELECTRODE) ×2 IMPLANT
ELECT REM PT RETURN 9FT ADLT (ELECTROSURGICAL) ×2
ELECTRODE REM PT RTRN 9FT ADLT (ELECTROSURGICAL) ×1 IMPLANT
GLOVE BIOGEL PI IND STRL 7.0 (GLOVE) IMPLANT
GLOVE BIOGEL PI IND STRL 8 (GLOVE) ×1 IMPLANT
GLOVE BIOGEL PI INDICATOR 7.0 (GLOVE) ×2
GLOVE BIOGEL PI INDICATOR 8 (GLOVE) ×1
GLOVE ECLIPSE 6.5 STRL STRAW (GLOVE) ×2 IMPLANT
GLOVE ECLIPSE 7.5 STRL STRAW (GLOVE) ×2 IMPLANT
GOWN STRL REUS W/ TWL LRG LVL3 (GOWN DISPOSABLE) ×1 IMPLANT
GOWN STRL REUS W/ TWL XL LVL3 (GOWN DISPOSABLE) ×1 IMPLANT
GOWN STRL REUS W/TWL LRG LVL3 (GOWN DISPOSABLE) ×2
GOWN STRL REUS W/TWL XL LVL3 (GOWN DISPOSABLE) ×2
KIT MARKER MARGIN INK (KITS) ×1 IMPLANT
LIQUID BAND (GAUZE/BANDAGES/DRESSINGS) ×2 IMPLANT
NDL HYPO 25X1 1.5 SAFETY (NEEDLE) ×1 IMPLANT
NEEDLE HYPO 25X1 1.5 SAFETY (NEEDLE) ×2 IMPLANT
NS IRRIG 1000ML POUR BTL (IV SOLUTION) ×1 IMPLANT
PACK BASIN DAY SURGERY FS (CUSTOM PROCEDURE TRAY) ×2 IMPLANT
PENCIL BUTTON HOLSTER BLD 10FT (ELECTRODE) ×2 IMPLANT
SLEEVE SCD COMPRESS KNEE MED (MISCELLANEOUS) ×2 IMPLANT
SUT MON AB 5-0 PS2 18 (SUTURE) ×2 IMPLANT
SUT VICRYL 3-0 CR8 SH (SUTURE) ×2 IMPLANT
SYR CONTROL 10ML LL (SYRINGE) ×2 IMPLANT
TOWEL OR 17X24 6PK STRL BLUE (TOWEL DISPOSABLE) ×2 IMPLANT
TOWEL OR NON WOVEN STRL DISP B (DISPOSABLE) ×2 IMPLANT
TUBE CONNECTING 20X1/4 (TUBING) ×2 IMPLANT
YANKAUER SUCT BULB TIP NO VENT (SUCTIONS) ×2 IMPLANT

## 2015-09-07 NOTE — H&P (View-Only) (Signed)
Jacqueline Jenkins 08/17/2015 2:34 PM Location: [facility] Patient #: [id number] DOB: 1951/01/14 Married / Language: English / Race: White Female   History of Present Illness Jacqueline Kitchen T. Valerio Pinard MD; 08/17/2015 3:09 PM) Patient words: New-Breast.  The patient is a 65 year old female who presents with breast cancer. [name] is a 65 year old post menopausal female referred by Dr. Melanee Spry for evaluation of recently diagnosed carcinoma of the left breast. She recently presented for a screening mamogram revealing a possible new density in the upper left breast. Subsequent imaging included diagnostic mamogram showing a 6 mm mass with mild architectural distortion in the upper outer left breast and ultrasound showing a hypoechoic 4 x 5 mm mass at the 1 o'clock position 1 cm from the nipple.. An ultrasound guided breast biopsy was performed on August 09, 2015 with pathology revealing invasive ductal carcinoma of the breast. She is seen now in the office for initial treatment planning. She has experienced no breast symptoms, specifically lump or pain or nipple discharge or skin changes. She does not have a personal history of any previous breast problems. She has a family history of breast cancer in her maternal grandmother developed at a very young age.  Findings at that time were the following: Tumor size: 0.5 cm Tumor grade: Not reported Estrogen Receptor: Positive Progesterone Receptor: Positive Her-2 neu: Negative Lymph node status: Negative    Other Problems Jacqueline Jenkins, CMA; 08/17/2015 2:37 PM) Lump In Breast  Past Surgical History Jacqueline Jenkins, CMA; 08/17/2015 2:37 PM) Aneurysm Repair Spinal Surgery - Lower Back Spinal Surgery - Neck  Allergies Jacqueline Jenkins, CMA; 08/17/2015 2:38 PM) Lescol *ANTIHYPERLIPIDEMICS* Lipitor *ANTIHYPERLIPIDEMICS* Vytorin *ANTIHYPERLIPIDEMICS*  Medication History Jacqueline Jenkins, CMA; 08/17/2015 2:40  PM) Lisinopril-Hydrochlorothiazide (20-12.5MG Tablet, Oral) Active. Aspirin (81MG Tablet, Oral) Active. Calcium-Vitamin D (600MG Tablet Chewable, Oral) Active. Flaxseed Oil (1000MG Capsule, Oral) Active. Multivitamin Adult (Oral) Active. Fish Oil (1200MG Capsule, Oral) Active. Flonase (50MCG/ACT Suspension, Nasal) Active. Medications Reconciled  Social History Jacqueline Jenkins, CMA; 08/17/2015 2:37 PM) Alcohol use Occasional alcohol use. No drug use Tobacco use Never smoker.  Family History Jacqueline Jenkins, CMA; 08/17/2015 2:37 PM) Alcohol Abuse Father. Breast Cancer Family Members In General. Heart Disease Mother. Respiratory Condition Mother.  Pregnancy / Birth History Jacqueline Jenkins, CMA; 08/17/2015 2:37 PM) Age of menopause 26-55 Maternal age 56-20    Review of Systems Jacqueline Jenkins CMA; 08/17/2015 2:37 PM) HEENT Present- Ringing in the Ears and Wears glasses/contact lenses. Not Present- Earache, Hearing Loss, Hoarseness, Nose Bleed, Oral Ulcers, Seasonal Allergies, Sinus Pain, Sore Throat, Visual Disturbances and Yellow Eyes. Endocrine Present- Hair Changes. Not Present- Cold Intolerance, Excessive Hunger, Heat Intolerance, Hot flashes and New Diabetes.  Vitals (Jacqueline Jenkins CMA; 08/17/2015 2:38 PM) 08/17/2015 2:37 PM Weight: 167.2 lb Height: 65in Body Surface Area: 1.83 m Body Mass Index: 27.82 kg/m  Temp.: 98.95F(Oral)  Pulse: 74 (Regular)  BP: 130/78 (Sitting, Left Arm, Standard)       Physical Exam Jacqueline Kitchen T. Glendy Barsanti MD; 08/17/2015 3:11 PM) The physical exam findings are as follows: Note:General: Alert, well-developed and well nourished Caucasian female, in no distress Skin: Warm and dry without rash or infection. HEENT: No palpable masses or thyromegaly. Sclera nonicteric. Pupils equal round and reactive. Lymph nodes: No cervical, supraclavicular, or inguinal nodes palpable. Breasts: No palpable masses in either breast. No  palpable axillary adenopathy. Lungs: Breath sounds clear and equal. No wheezing or increased work of breathing. Cardiovascular: Regular rate and rhythm without murmer. No JVD or edema. Abdomen: Nondistended.  Soft and nontender. No masses palpable. No organomegaly. No palpable hernias. Extremities: No edema or joint swelling or deformity. No chronic venous stasis changes. Neurologic: Alert and fully oriented. Gait normal. No focal weakness. Psychiatric: Normal mood and affect. Thought content appropriate with normal judgement and insight    Assessment & Plan Jacqueline Kitchen T. Wave Calzada MD; 08/17/2015 3:15 PM) BREAST CANCER, STAGE 1, LEFT (C50.912) Impression: 65 year old female with a new diagnosis of cancer of the left breast, upper outer quadrant. Clinical stage 1A, ER positive, PR positive, HER-2 negative. I discussed with the patient initial surgical treatment options. We discussed options of breast conservation with lumpectomy or total mastectomy and sentinal lymph node biopsy/dissection. Options for reconstruction were discussed. After discussion they have elected to proceed with lumpectomy with axillary sentinel lymph node biopsy. We discussed the indications and nature of the procedure, and expected recovery, in detail. Surgical risks including anesthetic complications, cardiorespiratory complications, bleeding, infection, wound healing complications, blood clots, lymphedema, local and distant recurrence and possible need for further surgery based on the final pathology was discussed and understood. Chemotherapy, hormonal therapy and radiation therapy have been discussed. They have been provided with literature regarding the treatment of breast cancer. All questions were answered. They understand and agree to proceed and we will go ahead with scheduling. Refer to radiation and medical oncology postoperatively. Refer to genetics. Current Plans Referred to Genetic Counseling, for evaluation and follow  up (Medical Genetics). Routine. Referred to Oncology, for evaluation and follow up (Oncology). Routine. Referred to Radiation Oncology, for evaluation and follow up (Radiation Oncology). Routine. Radioactive seed localized left breast lumpectomy with left axillary sentinel lymph node biopsy as an outpatient under general anesthesia  You are being scheduled for surgery - Our schedulers will call you.  You should hear from our office's scheduling department within 5 working days about the location, date, and time of surgery. We try to make accommodations for patient's preferences in scheduling surgery, but sometimes the OR schedule or the surgeon's schedule prevents [place] from making those accommodations.  If you have not heard from our office [phone]) in 5 working days, call the office and ask for your surgeon's nurse.  If you have other questions about your diagnosis, plan, or surgery, call the office and ask for your surgeon's nurse.  Pt Education - [facility] Breast Cancer Information Given - [employer] "Breast Journey" Package

## 2015-09-07 NOTE — Interval H&P Note (Signed)
History and Physical Interval Note: Results of her lumpectomy have revealed 3 close margins, less than 1 mm, for DCIS. I discussed this with the patient and recommended reexcision of these 3 margins and she is in agreement. I discussed the surgery and indications and risks.  On exam today she is alert and comfortable. Lungs clear to auscultation Cardiac: Regular rate and rhythm Breasts: Healing incision left breast without infection or other complicating factor   [id number] 12:59 PM  Jacqueline Jenkins  has presented today for surgery, with the diagnosis of cancer left breast  The various methods of treatment have been discussed with the patient and family. After consideration of risks, benefits and other options for treatment, the patient has consented to  Procedure(s): RE-EXCISION OF LEFT BREAST LUMPECTOMY (Left) as a surgical intervention .  The patient's history has been reviewed, patient examined, no change in status, stable for surgery.  I have reviewed the patient's chart and labs.  Questions were answered to the patient's satisfaction.     Machai Desmith T

## 2015-09-07 NOTE — Anesthesia Procedure Notes (Signed)
Procedure Name: LMA Insertion Date/Time: 09/07/2015 1:12 PM Performed by: Zen Cedillos D Pre-anesthesia Checklist: Patient identified, Emergency Drugs available, Suction available and Patient being monitored Patient Re-evaluated:Patient Re-evaluated prior to inductionOxygen Delivery Method: Circle System Utilized Preoxygenation: Pre-oxygenation with 100% oxygen Intubation Type: IV induction Ventilation: Mask ventilation without difficulty LMA: LMA inserted LMA Size: 4.0 Number of attempts: 1 Airway Equipment and Method: Bite block Placement Confirmation: positive ETCO2 Tube secured with: Tape Dental Injury: Teeth and Oropharynx as per pre-operative assessment

## 2015-09-07 NOTE — Anesthesia Postprocedure Evaluation (Signed)
Anesthesia Post Note  Patient: Jacqueline Jenkins  Procedure(s) Performed: Procedure(s) (LRB): RE-EXCISION OF LEFT BREAST LUMPECTOMY (Left)  Patient location during evaluation: PACU Anesthesia Type: General Level of consciousness: awake and alert Pain management: pain level controlled Vital Signs Assessment: post-procedure vital signs reviewed and stable Respiratory status: spontaneous breathing and respiratory function stable Cardiovascular status: blood pressure returned to baseline and stable Postop Assessment: no signs of nausea or vomiting Anesthetic complications: no    Last Vitals:  Filed Vitals:   09/07/15 1407 09/07/15 1415  BP: 125/72 124/70  Pulse: 100 95  Temp: 36.4 C   Resp: 31 12    Last Pain:  Filed Vitals:   09/07/15 1422  PainSc: 0-No pain                 Gearold Wainer S

## 2015-09-07 NOTE — Op Note (Signed)
Preoperative Diagnosis: cancer left breast  Postoprative Diagnosis: cancer left breast  Procedure: Procedure(s): RE-EXCISION OF LEFT BREAST LUMPECTOMY   Surgeon: Excell Seltzer T   Assistants: none  Anesthesia:  General LMA anesthesia  Indications: patient is approximately one week following left breast lumpectomy and axillary sentinel lymph node biopsy for invasive and in situ cancer of the upper outer left breast. Final pathology showed DCIS inside of 1 mm on the anterior, posterior and medial margins. I discussed this with the patient and recommended reexcision of these margins. The indications in nature and risks of surgery were discussed and she is in agreement.    Procedure Detail:  Patient was brought to the operating room, placed in the supine position on the operating table, and laryngeal mask general anesthesia induced. She received preoperative IV antibiotics. PAS were in place. The left breast was widely sterilely prepped and draped. Patient timeout was performed and correct procedure verified. The previous circumareolar incision was sharply opened and dissection carried down through the subcutaneous tissue. Previous suture material was removed and the entire lumpectomy cavity was widely opened. I identified the 3 margins to excise and the marking clips in place. Using cautery I widely excised the medial, anterior, and posterior margins back about 1/2 cm. Each margin was oriented with ink. Hemostasis was obtained with cautery. Soft tissue was infiltrated with Marcaine. Hemostasis was assured. Breast the simultaneous tissue was closed with interrupted 3-0 Vicryl and skin with subcuticular 5-0 Monocryl and Dermabond. Sponge needle and instrument counts were correct.    Findings: As above  Estimated Blood Loss:  Minimal         Drains: none  Blood Given: none          Specimens: reexcision of #1 medial margin, #2 posterior margin, #3 anterior margin, new margins marked with  ink        Complications:  * No complications entered in OR log *         Disposition: PACU - hemodynamically stable.         Condition: stable

## 2015-09-07 NOTE — Transfer of Care (Signed)
Immediate Anesthesia Transfer of Care Note  Patient: Jacqueline Jenkins  Procedure(s) Performed: Procedure(s): RE-EXCISION OF LEFT BREAST LUMPECTOMY (Left)  Patient Location: PACU  Anesthesia Type:General  Level of Consciousness: awake, alert , oriented and patient cooperative  Airway & Oxygen Therapy: Patient Spontanous Breathing and Patient connected to face mask oxygen  Post-op Assessment: Report given to RN and Post -op Vital signs reviewed and stable  Post vital signs: Reviewed and stable  Last Vitals:  Filed Vitals:   09/07/15 1111  BP: 141/67  Pulse: 81  Temp: 36.8 C  Resp: 20    Complications: No apparent anesthesia complications

## 2015-09-07 NOTE — Discharge Instructions (Signed)
[facility] °Office Phone Number [phone] ° °BREAST BIOPSY/ PARTIAL MASTECTOMY: POST OP INSTRUCTIONS ° °Always review your discharge instruction sheet given to you by the facility where your surgery was performed. ° °IF YOU HAVE DISABILITY OR FAMILY LEAVE FORMS, YOU MUST BRING THEM TO THE OFFICE FOR PROCESSING.  DO NOT GIVE THEM TO YOUR DOCTOR. ° °1. A prescription for pain medication may be given to you upon discharge.  Take your pain medication as prescribed, if needed.  If narcotic pain medicine is not needed, then you may take acetaminophen (Tylenol) or ibuprofen (Advil) as needed. °2. Take your usually prescribed medications unless otherwise directed °3. If you need a refill on your pain medication, please contact your pharmacy.  They will contact our office to request authorization.  Prescriptions will not be filled after 5pm or on week-ends. °4. You should eat very light the first 24 hours after surgery, such as soup, crackers, pudding, etc.  Resume your normal diet the day after surgery. °5. Most patients will experience some swelling and bruising in the breast.  Ice packs and a good support bra will help.  Swelling and bruising can take several days to resolve.  °6. It is common to experience some constipation if taking pain medication after surgery.  Increasing fluid intake and taking a stool softener will usually help or prevent this problem from occurring.  A mild laxative (Milk of Magnesia or Miralax) should be taken according to package directions if there are no bowel movements after 48 hours. °7. Unless discharge instructions indicate otherwise, you may remove your bandages 24-48 hours after surgery, and you may shower at that time.  You may have steri-strips (small skin tapes) in place directly over the incision.  These strips should be left on the skin for 7-10 days.  If your surgeon used skin glue on the incision, you may shower in 24 hours.  The glue will flake off over the  next 2-3 weeks.  Any sutures or staples will be removed at the office during your follow-up visit. °8. ACTIVITIES:  You may resume regular daily activities (gradually increasing) beginning the next day.  Wearing a good support bra or sports bra minimizes pain and swelling.  You may have sexual intercourse when it is comfortable. °a. You may drive when you no longer are taking prescription pain medication, you can comfortably wear a seatbelt, and you can safely maneuver your car and apply brakes. °b. RETURN TO WORK:  ______________________________________________________________________________________ °9. You should see your doctor in the office for a follow-up appointment approximately two weeks after your surgery.  Your doctor’s nurse will typically make your follow-up appointment when she calls you with your pathology report.  Expect your pathology report 2-3 business days after your surgery.  You may call to check if you do not hear from us after three days. °10. OTHER INSTRUCTIONS: _______________________________________________________________________________________________ _____________________________________________________________________________________________________________________________________ °_____________________________________________________________________________________________________________________________________ °_____________________________________________________________________________________________________________________________________ ° °WHEN TO CALL YOUR DOCTOR: °1. Fever over 101.0 °2. Nausea and/or vomiting. °3. Extreme swelling or bruising. °4. Continued bleeding from incision. °5. Increased pain, redness, or drainage from the incision. ° °The clinic staff is available to answer your questions during regular business hours.  Please don’t hesitate to call and ask to speak to one of the nurses for clinical concerns.  If you have a medical emergency, go to the nearest  emergency room or call 911.  A surgeon from [facility] is always on call at the hospital. ° °For further questions, please visit [email or web address]  ° ° ° °  Post Anesthesia Home Care Instructions ° °Activity: °Get plenty of rest for the remainder of the day. A responsible adult should stay with you for 24 hours following the procedure.  °For the next 24 hours, DO NOT: °-Drive a car °-Operate machinery °-Drink alcoholic beverages °-Take any medication unless instructed by your physician °-Make any legal decisions or sign important papers. ° °Meals: °Start with liquid foods such as gelatin or soup. Progress to regular foods as tolerated. Avoid greasy, spicy, heavy foods. If nausea and/or vomiting occur, drink only clear liquids until the nausea and/or vomiting subsides. Call your physician if vomiting continues. ° °Special Instructions/Symptoms: °Your throat may feel dry or sore from the anesthesia or the breathing tube placed in your throat during surgery. If this causes discomfort, gargle with warm salt water. The discomfort should disappear within 24 hours. ° °If you had a scopolamine patch placed behind your ear for the management of post- operative nausea and/or vomiting: ° °1. The medication in the patch is effective for 72 hours, after which it should be removed.  Wrap patch in a tissue and discard in the trash. Wash hands thoroughly with soap and water. °2. You may remove the patch earlier than 72 hours if you experience unpleasant side effects which may include dry mouth, dizziness or visual disturbances. °3. Avoid touching the patch. Wash your hands with soap and water after contact with the patch. °  ° °

## 2015-09-07 NOTE — Anesthesia Preprocedure Evaluation (Signed)
Anesthesia Evaluation  Patient identified by MRN, date of birth, ID band Patient awake    Reviewed: Allergy & Precautions, NPO status , Patient's Chart, lab work & pertinent test results  Airway Mallampati: II  TM Distance: >3 FB Neck ROM: Full    Dental no notable dental hx.    Pulmonary neg pulmonary ROS,    Pulmonary exam normal breath sounds clear to auscultation       Cardiovascular hypertension, Pt. on medications Normal cardiovascular exam Rhythm:Regular Rate:Normal     Neuro/Psych negative neurological ROS  negative psych ROS   GI/Hepatic negative GI ROS, Neg liver ROS,   Endo/Other  negative endocrine ROS  Renal/GU negative Renal ROS  negative genitourinary   Musculoskeletal negative musculoskeletal ROS (+)   Abdominal   Peds negative pediatric ROS (+)  Hematology negative hematology ROS (+)   Anesthesia Other Findings   Reproductive/Obstetrics negative OB ROS                             Anesthesia Physical Anesthesia Plan  ASA: II  Anesthesia Plan: General   Post-op Pain Management:    Induction: Intravenous  Airway Management Planned: LMA  Additional Equipment:   Intra-op Plan:   Post-operative Plan: Extubation in OR  Informed Consent: I have reviewed the patients History and Physical, chart, labs and discussed the procedure including the risks, benefits and alternatives for the proposed anesthesia with the patient or authorized representative who has indicated his/her understanding and acceptance.   Dental advisory given  Plan Discussed with: CRNA and Surgeon  Anesthesia Plan Comments:         Anesthesia Quick Evaluation  

## 2015-09-10 ENCOUNTER — Encounter (HOSPITAL_BASED_OUTPATIENT_CLINIC_OR_DEPARTMENT_OTHER): Payer: Self-pay | Admitting: General Surgery

## 2015-09-11 ENCOUNTER — Ambulatory Visit: Payer: Self-pay | Admitting: Internal Medicine

## 2015-09-13 ENCOUNTER — Telehealth: Payer: Self-pay | Admitting: Genetic Counselor

## 2015-09-13 NOTE — Telephone Encounter (Signed)
Discussed with Jacqueline Jenkins that her genetic test result was positive for a pathogenic mutation within one copy of the BRCA2 gene.  Discussed her coming in for a follow-up appointment and she would like to do this on Wednesday, January 18th at 2:30 PM before her radiation consults that day.  I encouraged her to bring someone, such as a family member with her.  Briefly reviewed surgical/recommendations for BRCA2 mutations.  Also reviewed that we definitely want daughters and siblings to have testing as well.  Based on the maternal family history of cancer, this mutation is likely being inherited from mom's side.  We will discuss NCCN recommendations and recommendations for familial genetic testing when she comes for a follow-up appointment on 1/18.

## 2015-09-18 ENCOUNTER — Encounter (HOSPITAL_COMMUNITY): Payer: Medicare Other

## 2015-09-18 ENCOUNTER — Telehealth: Payer: Self-pay | Admitting: *Deleted

## 2015-09-18 ENCOUNTER — Ambulatory Visit (HOSPITAL_COMMUNITY)
Admission: RE | Admit: 2015-09-18 | Discharge: 2015-09-18 | Disposition: A | Discharge status: Home / Self Care | Payer: Medicare Other | Source: Ambulatory Visit | Attending: Hematology and Oncology | Admitting: Hematology and Oncology

## 2015-09-18 ENCOUNTER — Encounter (HOSPITAL_COMMUNITY)
Admission: RE | Admit: 2015-09-18 | Discharge: 2015-09-18 | Disposition: A | Discharge status: Home / Self Care | Payer: Medicare Other | Source: Ambulatory Visit | Attending: Hematology and Oncology | Admitting: Hematology and Oncology

## 2015-09-18 ENCOUNTER — Encounter (HOSPITAL_COMMUNITY): Payer: Self-pay

## 2015-09-18 DIAGNOSIS — K760 Fatty (change of) liver, not elsewhere classified: Secondary | ICD-10-CM | POA: Insufficient documentation

## 2015-09-18 DIAGNOSIS — N8189 Other female genital prolapse: Secondary | ICD-10-CM | POA: Insufficient documentation

## 2015-09-18 DIAGNOSIS — I728 Aneurysm of other specified arteries: Secondary | ICD-10-CM | POA: Insufficient documentation

## 2015-09-18 DIAGNOSIS — K8020 Calculus of gallbladder without cholecystitis without obstruction: Secondary | ICD-10-CM | POA: Diagnosis not present

## 2015-09-18 DIAGNOSIS — C50412 Malignant neoplasm of upper-outer quadrant of left female breast: Secondary | ICD-10-CM | POA: Diagnosis not present

## 2015-09-18 DIAGNOSIS — C50912 Malignant neoplasm of unspecified site of left female breast: Secondary | ICD-10-CM | POA: Diagnosis not present

## 2015-09-18 DIAGNOSIS — D259 Leiomyoma of uterus, unspecified: Secondary | ICD-10-CM | POA: Diagnosis not present

## 2015-09-18 MED ORDER — IOHEXOL 300 MG/ML  SOLN
100.0000 mL | Freq: Once | INTRAMUSCULAR | Status: AC | PRN
  Administered 2015-09-18: 100 mL via INTRAVENOUS

## 2015-09-18 MED ORDER — TECHNETIUM TC 99M SESTAMIBI GENERIC - CARDIOLITE
25.6000 | Freq: Once | INTRAVENOUS | Status: AC | PRN
  Administered 2015-09-18: 26 via INTRAVENOUS

## 2015-09-18 MED ORDER — IOHEXOL 300 MG/ML  SOLN
50.0000 mL | Freq: Once | INTRAMUSCULAR | Status: AC | PRN
  Administered 2015-09-18: 50 mL via ORAL

## 2015-09-18 NOTE — Telephone Encounter (Signed)
Received order per Dr. Lindi Adie for Mammaprint testing. D/t medicare guidelines testing could not be ordered until 14 days after sx. Order sent to [facility] and pathology on 09/13/15.

## 2015-09-19 ENCOUNTER — Ambulatory Visit: Payer: Medicare Other

## 2015-09-19 ENCOUNTER — Ambulatory Visit: Payer: Medicare Other | Admitting: Genetic Counselor

## 2015-09-19 ENCOUNTER — Ambulatory Visit
Admission: RE | Admit: 2015-09-19 | Discharge: 2015-09-19 | Disposition: A | Discharge status: Home / Self Care | Payer: Medicare Other | Source: Ambulatory Visit | Attending: Radiation Oncology | Admitting: Radiation Oncology

## 2015-09-19 DIAGNOSIS — Z800 Family history of malignant neoplasm of digestive organs: Secondary | ICD-10-CM

## 2015-09-19 DIAGNOSIS — Z1509 Genetic susceptibility to other malignant neoplasm: Secondary | ICD-10-CM

## 2015-09-19 DIAGNOSIS — Z1379 Encounter for other screening for genetic and chromosomal anomalies: Secondary | ICD-10-CM | POA: Insufficient documentation

## 2015-09-19 DIAGNOSIS — C50412 Malignant neoplasm of upper-outer quadrant of left female breast: Secondary | ICD-10-CM

## 2015-09-19 DIAGNOSIS — Z1501 Genetic susceptibility to malignant neoplasm of breast: Secondary | ICD-10-CM | POA: Insufficient documentation

## 2015-09-19 DIAGNOSIS — Z803 Family history of malignant neoplasm of breast: Secondary | ICD-10-CM

## 2015-09-19 NOTE — Progress Notes (Signed)
GENETIC TEST RESULTS   Patient Name: Jacqueline Jenkins Patient Age: 65 y.o. Encounter Date: 09/19/2015  Referring Provider:  Excell Seltzer, MD   Ms. Jacqueline Jenkins was seen in the [place] clinic on August 30, 2015 due to a personal history of breast cancer and family history of pancreatic cancer, early-onset breast cancer, and other cancers, and concern regarding a hereditary predisposition to cancer in the family. Please refer to the prior [facility] note for more information regarding Ms. Jacqueline Jenkins's medical and family histories and our assessment at the time.   GENETIC TESTING:  At the time of Ms. Jacqueline Jenkins visit from 08/30/15, we recommended she pursue genetic testing of the 20 genes on the Breast/Ovarian Cancer Panel performed by [facility].  The Breast/Ovarian Cancer Panel offered by [facility] Jacqueline Jenkins Pigeon, MD) includes sequencing and deletion/duplication analysis for the following 19 genes:  ATM, BARD1, BRCA1, BRCA2, BRIP1, CDH1, CHEK2, FANCC, MLH1, MSH2, MSH6, NBN, PALB2, PMS2, PTEN, RAD51C, RAD51D, TP53, and XRCC2.  This panel also includes deletion/duplication analysis (without sequencing) for one gene, EPCAM.  Those results are back, the report date for which is September 12, 2015.  Testing revealed a mutation in one copy of the BRCA2 gene called "[id number] ([id number])".  Additionally, one variant of uncertain significance (VUS) called "c.320-5T>A (IVS2-5T>A)" was found in one copy of the CHEK2 gene.  MEDICAL MANAGEMENT: Women who have a BRCA2 mutation have an increased risk for both breast and ovarian cancer.   Ms. Jacqueline Jenkins is at increased risk of a second breast cancer and ovarian cancer and we, therefore, discussed the recommendations below from the NCCN that are specific to women with a BRCA mutation. She will need to decide whether she will proceed with increased breast screenings vs. risk-reduction bilateral mastectomies. Her ovaries and fallopian  tubes are still intact and we discussed the recommendation for a BSO.  Breast Cancer:  - Starting at age 44: Breast awareness - Women should be familiar with their breasts and promptly report changes to their healthcare provider. Performing regular breast self exams may help increase breast awareness, especially when checked at the end of the menstrual cycle.  - Starting at age 20: Clinical breast exam every 6-12 months. - Between ages 25-29 or individualized based on family history: Breast MRI screening (preferred) every year or mammogram if MRI is unavailable. - Between ages 33-75: Mammogram and breast MRI screening every year.  - Option of risk-reducing bilateral mastectomies  Ovarian Cancer: - Recommend risk-reducing salpingo-oophorectomy (RRSO), typically between 28 and 40 y, and upon completion of child bearing. Because ovarian cancer onset in patients with BRCA2 mutations is an average of 8-10 years later than in patients with BRCA1 mutations, it is reasonable to delay RRSO until age 85-45 y in patients with BRCA2 mutations who have already maximized their breast cancer prevention (ie, undergone bilateral mastectomy).   - While there may be circumstances where ovarian cancer screening with transvaginal ultrasound and a blood test for a protein called CA-125 are helpful, these techniques have not been shown to be effective in detecting early ovarian cancer and are generally not recommended.    - Consider risk reduction agents as options for breast and ovarian cancer, including discussing risks and benefits.  Other Cancers: We discussed that while literature has shown other cancers, such as pancreatic cancer and melanoma, to be associated with BRCA2 mutations, national guidelines do not currently recommend any specific screenings for these cancers. Screening may be individualized based on cancers observed in the family.  ADDITIONAL VUS RESULT: Genetic testing did identify a variant of  uncertain significance (VUS) called "c.320-5T>A (IVS2-5T>A)" in one copy of the CHEK2 gene. At this time, it is unknown if this VUS is associated with an increased risk for cancer or if this is a normal finding. Since this VUS result is uncertain, it cannot help guide screening recommendations, and family members should not be tested for this VUS to help define their own cancer risks.  Also, we all have variants within our genes that make [place] unique individuals--most of these variants are benign.  Thus, we treat this VUS as a negative result.   With time, we suspect the lab will reclassify this variant and when they do, we will try to re-contact Ms. Jacqueline Jenkins to discuss the reclassification further.  We also encouraged Ms. Jacqueline Jenkins to contact us in a year or two to obtain an update on the status of this VUS.  FAMILY MEMBERS: It is important that all of Ms. Jacqueline Jenkins relatives (both men and women) know of the presence of this gene mutation.  Female family members with BRCA2 mutations are at an increased risk for prostate cancer, as well as a somewhat increased risk for breast, pancreatic, and melanoma cancers. Site-specific genetic testing can sort out who in the family is at risk and who is not.    Ms. Jacqueline Jenkins daughters and siblings have a 50% chance to have inherited this mutation. We recommend they have genetic testing for this same mutation, as identifying the presence of this mutation would allow them to also take advantage of risk-reducing measures.  Based on the maternal family history of early-onset breast cancer and pancreatic cancer, it is most likely that this mutation has been inherited from Jacqueline Jenkins mother.  Ms. Jacqueline Jenkins maternal cousins should be made aware of this mutation as well, so that they can receive genetic counseling and testing to determine their cancer risks.     SUPPORT AND RESOURCES: If Ms. Jacqueline Jenkins is interested in BRCA2-specific information and support, there are two groups, Facing Our Risk of  Cancer Empowered, or FORCE, (www.facingourrisk.com) and Bright Pink (www.brightpink.org) which some people have found useful. They provide opportunities to speak with other individuals from high-risk families. To locate genetic counselors in other cities, visit the website of the [employer] (ArtistMovie.se) and Secretary/administrator for a Social worker by zip code.  Ms. Baumgarner was provided with print information on the [employer]CE and [employer], information to connect with the iCARE hereditary cancer registry, as well as with a copy of [facility]'s Ovarian Cancer Risk-Reducing Surgery: A Decision-Making Resource book, a copy of the NCCN BRCA cancer screening guidelines, copies of her test result and pedigree for her to keep and some to share with at-risk family members.  She was also given a results letter specifically to share with her family members and a results/follow-up visit letter will be sent to her via secure email.    We encouraged Ms. Burger to remain in contact with [place] on an annual basis so we can update her personal and family histories, and let her know of advances in cancer genetics that may benefit the family. Our contact number was provided. Ms. Grills questions were answered to her satisfaction today, and she knows she is welcome to call anytime with additional questions.    Jeanine Luz, MS Genetic Counselor Phone: [phone] [email or web address]

## 2015-09-21 ENCOUNTER — Encounter: Payer: Self-pay | Admitting: Genetic Counselor

## 2015-09-24 ENCOUNTER — Telehealth: Payer: Self-pay | Admitting: Hematology and Oncology

## 2015-09-24 ENCOUNTER — Encounter (HOSPITAL_COMMUNITY): Payer: Self-pay

## 2015-09-24 ENCOUNTER — Encounter: Payer: Self-pay | Admitting: *Deleted

## 2015-09-24 NOTE — Progress Notes (Signed)
Received Mammaprint score of - "High Risk". Physician team notified.

## 2015-09-24 NOTE — Telephone Encounter (Signed)
Spoke to patient about 1/26 appoint per 1/23 pof.

## 2015-09-27 ENCOUNTER — Ambulatory Visit (HOSPITAL_BASED_OUTPATIENT_CLINIC_OR_DEPARTMENT_OTHER): Payer: Medicare Other | Admitting: Hematology and Oncology

## 2015-09-27 ENCOUNTER — Telehealth: Payer: Self-pay | Admitting: Hematology and Oncology

## 2015-09-27 ENCOUNTER — Encounter: Payer: Self-pay | Admitting: Hematology and Oncology

## 2015-09-27 VITALS — BP 152/73 | HR 88 | Temp 98.4°F | Resp 18 | Ht 65.0 in | Wt 170.1 lb

## 2015-09-27 DIAGNOSIS — Z1501 Genetic susceptibility to malignant neoplasm of breast: Secondary | ICD-10-CM

## 2015-09-27 DIAGNOSIS — C50412 Malignant neoplasm of upper-outer quadrant of left female breast: Secondary | ICD-10-CM | POA: Diagnosis not present

## 2015-09-27 DIAGNOSIS — Z1509 Genetic susceptibility to other malignant neoplasm: Secondary | ICD-10-CM

## 2015-09-27 NOTE — Progress Notes (Signed)
Unable to get in to exam room prior to MD.  No assessment performed.  

## 2015-09-27 NOTE — Telephone Encounter (Signed)
Appointments made and avs printed for patient,echo to [facility] for precert °

## 2015-09-27 NOTE — Progress Notes (Signed)
Patient Care Team: Unk Pinto, MD as PCP - General (Internal Medicine) Ralene Bathe, MD as Consulting Physician (Ophthalmology) Serafina Mitchell, MD as Consulting Physician (Vascular Surgery) Carol Ada, MD as Consulting Physician (Gastroenterology) Amy Martinique, MD as Consulting Physician (Dermatology) Nicholas Lose, MD as Consulting Physician (Hematology and Oncology) Excell Seltzer, MD as Consulting Physician (General Surgery)  SUMMARY OF ONCOLOGIC HISTORY:   Breast cancer of upper-outer quadrant of left female breast ([place])   08/03/2015 Mammogram Left Breast: 6 mm mass @ 1 o clock   08/09/2015 Initial Diagnosis Left breast 1:00 position: Invasive ductal carcinoma grade 1-2, ER 100%, PR 10%, HER-2 negative ratio 1.79, Ki-67 15%, T1 BN 0 stage I a clinical stage   08/29/2015 Surgery Left lumpectomy: invasive ductal carcinoma 1.2 cm with LVID, with DCIS intermediate grade, 1 sentinel node positive, ER 100%, PR 10%, HER-2 negative ratio 1.79, Ki-67 15% , margins negative, Mammaprint high risk, luminal B, T1cN1 stage II a    CHIEF COMPLIANT: follow-up to discuss Mammaprint testing  INTERVAL HISTORY: Jacqueline Jenkins is a 65 year old with above-mentioned history of left breast cancer treated with lumpectomy and had a positive lymph node. She underwent Mammaprint testing which revealed high risk human L type B phenotype. She is here today to discuss the role of chemotherapy. She has healed very well from previous surgeries include the reresection of the margins.  REVIEW OF SYSTEMS:   Constitutional: Denies fevers, chills or abnormal weight loss Eyes: Denies blurriness of vision Ears, nose, mouth, throat, and face: Denies mucositis or sore throat Respiratory: Denies cough, dyspnea or wheezes Cardiovascular: Denies palpitation, chest discomfort Gastrointestinal:  Denies nausea, heartburn or change in bowel habits Skin: Denies abnormal skin rashes Lymphatics: Denies new lymphadenopathy  or easy bruising Neurological:Denies numbness, tingling or new weaknesses Behavioral/Psych: Mood is stable, no new changes  Extremities: No lower extremity edema Breast: healed pretty well All other systems were reviewed with the patient and are negative.  I have reviewed the past medical history, past surgical history, social history and family history with the patient and they are unchanged from previous note.  ALLERGIES:  is allergic to lescol; lipitor; and vytorin.  MEDICATIONS:  Current Outpatient Prescriptions  Medication Sig Dispense Refill  . aspirin 81 MG tablet Take 81 mg by mouth daily.    . benazepril-hydrochlorthiazide (LOTENSIN HCT) 20-12.5 MG tablet TAKE 1 TABLET BY MOUTH DAILY. 90 tablet 1  . Calcium Carbonate-Vitamin D (CALCIUM + D PO) Take 1 tablet by mouth 2 (two) times daily.    . Flaxseed, Linseed, (FLAXSEED OIL) 1000 MG CAPS Take 1,000 mg by mouth daily.    . fluticasone (FLONASE) 50 MCG/ACT nasal spray Place 1 spray into both nostrils as needed for allergies or rhinitis.    Marland Kitchen HYDROcodone-acetaminophen (NORCO/VICODIN) 5-325 MG tablet Take 1-2 tablets by mouth every 4 (four) hours as needed for moderate pain or severe pain. 30 tablet 0  . lisinopril-hydrochlorothiazide (PRINZIDE,ZESTORETIC) 20-12.5 MG tablet Take 1 tablet by mouth daily.  99  . Multiple Vitamin (MULTIVITAMIN) tablet Take 1 tablet by mouth daily.    . Omega-3 Fatty Acids (FISH OIL) 1200 MG CAPS Take by mouth.     No current facility-administered medications for this visit.    PHYSICAL EXAMINATION: ECOG PERFORMANCE STATUS: 1 - Symptomatic but completely ambulatory  Filed Vitals:   09/27/15 1353  BP: 152/73  Pulse: 88  Temp: 98.4 F (36.9 C)  Resp: 18   Filed Weights   09/27/15 1353  Weight: 170 lb  1.6 oz (77.157 kg)    GENERAL:alert, no distress and comfortable SKIN: skin color, texture, turgor are normal, no rashes or significant lesions EYES: normal, Conjunctiva are pink and  non-injected, sclera clear OROPHARYNX:no exudate, no erythema and lips, buccal mucosa, and tongue normal  NECK: supple, thyroid normal size, non-tender, without nodularity LYMPH:  no palpable lymphadenopathy in the cervical, axillary or inguinal LUNGS: clear to auscultation and percussion with normal breathing effort HEART: regular rate & rhythm and no murmurs and no lower extremity edema ABDOMEN:abdomen soft, non-tender and normal bowel sounds MUSCULOSKELETAL:no cyanosis of digits and no clubbing  NEURO: alert & oriented x 3 with fluent speech, no focal motor/sensory deficits EXTREMITIES: No lower extremity edema  LABORATORY DATA:  I have reviewed the data as listed   Chemistry      Component Value Date/Time   NA 135 08/24/2015 1205   K 4.2 08/24/2015 1205   CL 102 08/24/2015 1205   CO2 26 08/24/2015 1205   BUN 8 08/24/2015 1205   CREATININE 0.62 08/24/2015 1205   CREATININE 0.62 06/07/2015 1602      Component Value Date/Time   CALCIUM 9.3 08/24/2015 1205   ALKPHOS 58 06/07/2015 1602   AST 22 06/07/2015 1602   ALT 21 06/07/2015 1602   BILITOT 0.6 06/07/2015 1602       Lab Results  Component Value Date   WBC 6.4 06/07/2015   HGB 15.0 08/29/2015   HCT 40.1 06/07/2015   MCV 91.8 06/07/2015   PLT 293 06/07/2015   NEUTROABS 4.1 06/07/2015     ASSESSMENT & PLAN:  Breast cancer of upper-outer quadrant of left female breast ([place]) Left lumpectomy 08/29/2015: Invasive ductal carcinoma 1.2 cm with LVID, with DCIS intermediate grade, 1 sentinel node positive, ER 100%, PR 10%, HER-2 negative ratio 1.79, Ki-67 15% , margins negative, Mammaprint high risk, luminal B, T1cN1 stage II a. Patient does not need axillary lymph node dissection based on current NCCN guidelines and ACOSOG Z 11 clinical trial  Mammaprint counseling: I discussed the Mammaprint report of the patient provided  a copy of this report. Patient has luminal type B and high-risk phenotype. Risk of relapse without  chemotherapy 24% versus 12% with chemotherapy. Based on this report, I recommended adjuvant chemotherapy.  Recommendation: 1. Dose dense Adriamycin and Cytoxan 4 followed by Abraxane weekly 12 2. Adjuvant radiation therapy followed by 3. Adjuvant antiestrogen therapy  Chemotherapy counseling:I have discussed the risks and benefits of chemotherapy including the risks of nausea/ vomiting, risk of infection from low WBC count, fatigue due to chemo or anemia, bruising or bleeding due to low platelets, mouth sores, loss/ change in taste and decreased appetite. Liver and kidney function will be monitored through out chemotherapy as abnormalities in liver and kidney function may be a side effect of treatment. Cardiac dysfunction due to Adriamycin was discussed in detail. Risk of permanent bone marrow dysfunction and leukemia due to chemo were also discussed.  Plan: 1. Port Placement 2. Chemotherapy class 3. Echocardiogram  Return to clinic in 2 weeks to start chemotherapy  ------------------------------------------------------------------------------------------------------------------------------------------------------- BRCA2 gene mutation: I discussed with the patient that BRCA2 is a tumor suppressor gene which helps repair damaged DNA or destroy cells of the VNA cannot be repaired. In patients with BRCA1 or 2 mutations, the damaged DNA could not be repaired properly increasing the risk of cancers. BRCA2 gene is located on long arm of chromosome 13. These mutations are inherited in autosomal dominant fashion and hence 50% probability that their children  may have a BRCA mutation.  Cancer risk:  Average to risk of cancer by age of 55: (Antoniou et al pooled pedigree data from 21 studies of 8139 index patients with breast or ovarian cancer) Breast cancer risk: 45 percent (95% CI, 33 to 54 percent) Ovarian cancer risk: 11 percent (95% CI, 4.1 to 18 percent)  [place] study: Tumor limited to risk by age  of 73: Breast cancer risk: 47 percent (95% CI, 41 to 70 percent) Ovarian cancer risk: 16.5 percent (95% CI, 7.5 to 34 percent)  I discussed the difference between BRCA1 and BRCA2 wherein patients BRCA1 and 2 have early onset disease and much higher risk of breast cancer. The mean age of diagnosis of breast cancer between BRCA1 and 2 are 64 versus 59 years, risk of ovarian cancer is also higher with BRCA1 but the overall risk under the age of 65 is very low.  Other cancer risks:  1. Pancreatic cancer (relative risk is 3.51) with incidence of 4.9% in BRCA2 carriers 2. Fallopian tube carcinoma and primary peritoneal carcinoma 3. Uterine papillary serous carcinoma: Overall risk is very low 4. Colorectal cancers: In many studies there was no increased risk But there may be some for BRCA1 carriers 5. Melanoma and other skin cancers: The risk of oatmeal melanoma is increase in BRCA2 mutation carriers but it still extremely rare. 6. Endometrial cancer: Not very clear in terms of risks it is thought to be very low  Breast cancer risk reduction/surveillance: 1. Annual mammogram and breast MRI are recommended by NCCN 2. Chemoprevention with tamoxifen-like agents is not entirely clear. Based on NSABP P1 study in the small cohort of patients with BRCA1 mutations, tamoxifen to reduce breast cancer risk by 62% and BRCA2 carriers but not in BRCA1 carriers. The study is limited because of small numbers. I do not recommend it primarily because most commonly patients with BRCA mutations tend to have triple negative disease. 3. Oophorectomy reduces the risk of breast cancer by 50% 5. Breast self-examinations starting at age 45   Ovarian cancer risk reduction: 1. Risk reducing bilateral salpingo-oophorectomy between the ages of 48-40 once childbearing is complete is recommended. In one study that was 72% reduction of risk of ovarian cancer and a decrease in breast cancer by approximately 50% 2. There is no clear  data for surveillance with CA125 or vaginal ultrasounds 2. Oral contraception dose may be protective against ovarian cancer but it may slightly increase risk of breast cancer.  Recommendation: Will contact Dr. Paula Compton regarding prophylactic bilateral salpingo-oophorectomy if it can be done prior to start of chemotherapy. No orders of the defined types were placed in this encounter.   The patient has a good understanding of the overall plan. she agrees with it. she will call with any problems that may develop before the next visit here.   Rulon Eisenmenger, MD 09/27/2015

## 2015-09-27 NOTE — Assessment & Plan Note (Addendum)
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 Patient does not need axillary lymph node dissection based on current NCCN guidelines and ACOSOG Z 11 clinical trial  Mammaprint counseling: I discussed the Mammaprint report of the patient provided  a copy of this report. Based on this report, I recommended adjuvant chemotherapy.  Recommendation: 1. Dose dense Adriamycin and Cytoxan 4 followed by Abraxane weekly 12 2. Adjuvant radiation therapy followed by 3. Adjuvant antiestrogen therapy  Chemotherapy counseling:I have discussed the risks and benefits of chemotherapy including the risks of nausea/ vomiting, risk of infection from low WBC count, fatigue due to chemo or anemia, bruising or bleeding due to low platelets, mouth sores, loss/ change in taste and decreased appetite. Liver and kidney function will be monitored through out chemotherapy as abnormalities in liver and kidney function may be a side effect of treatment. Cardiac dysfunction due to Adriamycin was discussed in detail. Risk of permanent bone marrow dysfunction and leukemia due to chemo were also discussed.  I discussed participation in PREVENT clinical trial PREVENT trial: [place] [phone]  Newly diagnosed breast cancer Stages 1-3 scheduled to receive anthracycline randomized to atorvastatin 40 mg or placebo once daily for 24 months along with 3 cardiac MRIs or 2 years paid for by the trial. I discussed the risks and benefits of atorvastatin including the risk of myopathy and elevation of liver function tests. I explained the randomization process and patient is interested in the trial. I provided her with literature to read and provided her with the contact with the clinical trials nurse to ask any questions regarding the  trial.  Plan: 1. Port Placement 2. Chemotherapy class 3. Echocardiogram  Return to clinic in 2 weeks to start chemotherapy

## 2015-10-02 DIAGNOSIS — Z6828 Body mass index (BMI) 28.0-28.9, adult: Secondary | ICD-10-CM | POA: Diagnosis not present

## 2015-10-02 DIAGNOSIS — Z1501 Genetic susceptibility to malignant neoplasm of breast: Secondary | ICD-10-CM | POA: Diagnosis not present

## 2015-10-02 DIAGNOSIS — C50912 Malignant neoplasm of unspecified site of left female breast: Secondary | ICD-10-CM | POA: Diagnosis not present

## 2015-10-02 DIAGNOSIS — Z1389 Encounter for screening for other disorder: Secondary | ICD-10-CM | POA: Diagnosis not present

## 2015-10-02 DIAGNOSIS — Z01419 Encounter for gynecological examination (general) (routine) without abnormal findings: Secondary | ICD-10-CM | POA: Diagnosis not present

## 2015-10-03 ENCOUNTER — Encounter: Payer: Self-pay | Admitting: *Deleted

## 2015-10-03 ENCOUNTER — Other Ambulatory Visit: Payer: Medicare Other

## 2015-10-04 ENCOUNTER — Other Ambulatory Visit: Payer: Self-pay | Admitting: General Surgery

## 2015-10-04 ENCOUNTER — Other Ambulatory Visit: Payer: Self-pay | Admitting: Hematology and Oncology

## 2015-10-04 ENCOUNTER — Ambulatory Visit: Payer: Medicare Other | Admitting: Radiation Oncology

## 2015-10-04 ENCOUNTER — Inpatient Hospital Stay: Admission: RE | Admit: 2015-10-04 | Payer: Medicare Other | Source: Ambulatory Visit

## 2015-10-04 ENCOUNTER — Ambulatory Visit: Payer: Medicare Other

## 2015-10-04 ENCOUNTER — Ambulatory Visit
Admission: RE | Admit: 2015-10-04 | Discharge: 2015-10-04 | Disposition: A | Discharge status: Home / Self Care | Payer: Medicare Other | Source: Ambulatory Visit | Attending: Radiation Oncology | Admitting: Radiation Oncology

## 2015-10-04 DIAGNOSIS — C50412 Malignant neoplasm of upper-outer quadrant of left female breast: Secondary | ICD-10-CM

## 2015-10-04 MED ORDER — LIDOCAINE-PRILOCAINE 2.5-2.5 % EX CREA: TOPICAL_CREAM | CUTANEOUS | Status: DC

## 2015-10-04 MED ORDER — PROCHLORPERAZINE MALEATE 10 MG PO TABS: 10.0000 mg | ORAL_TABLET | Freq: Four times a day (QID) | ORAL | Status: DC | PRN

## 2015-10-04 MED ORDER — ONDANSETRON HCL 8 MG PO TABS: 8.0000 mg | ORAL_TABLET | Freq: Two times a day (BID) | ORAL | Status: DC | PRN

## 2015-10-04 MED ORDER — LORAZEPAM 0.5 MG PO TABS: 0.5000 mg | ORAL_TABLET | Freq: Every day | ORAL | Status: DC

## 2015-10-04 MED ORDER — DEXAMETHASONE 4 MG PO TABS
4.0000 mg | ORAL_TABLET | Freq: Two times a day (BID) | ORAL | Status: DC
Start: 2015-10-04 — End: 2015-12-06

## 2015-10-04 NOTE — Patient Instructions (Addendum)
Jacqueline Jenkins  10/04/2015   Your procedure is scheduled on: 10-09-15  Report to [facility] Main  Entrance take [name]  elevators to 3rd floor to  [name] at 1125 AM.  Call this number if you have problems the morning of surgery [phone]   Remember: ONLY 1 PERSON MAY GO WITH YOU TO SHORT STAY TO GET  READY MORNING OF [name].  Do not eat food  :After Midnight, CLEAR LIQUIDS MIDNIGHT UNTIL 725 AM, NOTHING BY MOUTH AFTER 725 AM DAY OF SURGERY.     Take these medicines the morning of surgery with A SIP OF WATER: FLONASE NASAL SPRAY IF NEEDED                                You may not have any metal on your body including hair pins and              piercings  Do not wear jewelry, make-up, lotions, powders or perfumes, deodorant             Do not wear nail polish.  Do not shave  48 hours prior to surgery.              Men may shave face and neck.   Do not bring valuables to the hospital. [place].  Contacts, dentures or bridgework may not be worn into surgery.  Leave suitcase in the car. After surgery it may be brought to your room.     Patients discharged the day of surgery will not be allowed to drive home.  Name and phone number of your driver:Jacqueline Jenkins daughter cell [phone]  Special Instructions: N/A              Please read over the following fact sheets you were given: _____________________________________________________________________             [name] - Preparing for Surgery Before surgery, you can play an important role.  Because skin is not sterile, your skin needs to be as free of germs as possible.  You can reduce the number of germs on your skin by washing with CHG (chlorahexidine gluconate) soap before surgery.  CHG is an antiseptic cleaner which kills germs and bonds with the skin to continue killing germs even after washing. Please DO NOT use if you have an  allergy to CHG or antibacterial soaps.  If your skin becomes reddened/irritated stop using the CHG and inform your nurse when you arrive at Short Stay. Do not shave (including legs and underarms) for at least 48 hours prior to the first CHG shower.  You may shave your face/neck. Please follow these instructions carefully:  1.  Shower with CHG Soap the night before surgery and the  morning of Surgery.  2.  If you choose to wash your hair, wash your hair first as usual with your  normal  shampoo.  3.  After you shampoo, rinse your hair and body thoroughly to remove the  shampoo.                           4.  Use CHG as you would any other liquid soap.  You  can apply chg directly  to the skin and wash                       Gently with a scrungie or clean washcloth.  5.  Apply the CHG Soap to your body ONLY FROM THE NECK DOWN.   Do not use on face/ open                           Wound or open sores. Avoid contact with eyes, ears mouth and genitals (private parts).                       Wash face,  Genitals (private parts) with your normal soap.             6.  Wash thoroughly, paying special attention to the area where your surgery  will be performed.  7.  Thoroughly rinse your body with warm water from the neck down.  8.  DO NOT shower/wash with your normal soap after using and rinsing off  the CHG Soap.                9.  Pat yourself dry with a clean towel.            10.  Wear clean pajamas.            11.  Place clean sheets on your bed the night of your first shower and do not  sleep with pets. Day of Surgery : Do not apply any lotions/deodorants the morning of surgery.  Please wear clean clothes to the hospital/surgery center.  FAILURE TO FOLLOW THESE INSTRUCTIONS MAY RESULT IN THE CANCELLATION OF YOUR SURGERY PATIENT SIGNATURE_________________________________  NURSE  SIGNATURE__________________________________  ________________________________________________________________________    CLEAR LIQUID DIET   Foods Allowed                                                                     Foods Excluded  Coffee and tea, regular and decaf                             liquids that you cannot  Plain Jell-O in any flavor                                             see through such as: Fruit ices (not with fruit pulp)                                     milk, soups, orange juice  Iced Popsicles                                    All solid food Carbonated beverages, regular and diet  Cranberry, grape and apple juices Sports drinks like Gatorade Lightly seasoned clear broth or consume(fat free) Sugar, honey syrup  Sample Menu Breakfast                                Lunch                                     Supper Cranberry juice                    Beef broth                            Chicken broth Jell-O                                     Grape juice                           Apple juice Coffee or tea                        Jell-O                                      Popsicle                                                Coffee or tea                        Coffee or tea  _____________________________________________________________________

## 2015-10-04 NOTE — Telephone Encounter (Signed)
Request for preauth was rerouted to [facility]) - per Velna Hatchet NPR. Spoke with patient re echo appointment for 2/6 @ 10 am.

## 2015-10-04 NOTE — Progress Notes (Signed)
CHEST CT 09-18-15 EPIC EKG 01-22-15 EPIC

## 2015-10-08 ENCOUNTER — Encounter (HOSPITAL_COMMUNITY): Payer: Self-pay

## 2015-10-08 ENCOUNTER — Ambulatory Visit (HOSPITAL_COMMUNITY)
Admission: RE | Admit: 2015-10-08 | Discharge: 2015-10-08 | Disposition: A | Discharge status: Home / Self Care | Payer: Medicare Other | Source: Ambulatory Visit | Attending: Hematology and Oncology | Admitting: Hematology and Oncology

## 2015-10-08 ENCOUNTER — Encounter (HOSPITAL_COMMUNITY)
Admission: RE | Admit: 2015-10-08 | Discharge: 2015-10-08 | Disposition: A | Discharge status: Home / Self Care | Payer: Medicare Other | Source: Ambulatory Visit | Attending: General Surgery | Admitting: General Surgery

## 2015-10-08 DIAGNOSIS — Z01818 Encounter for other preprocedural examination: Secondary | ICD-10-CM | POA: Insufficient documentation

## 2015-10-08 DIAGNOSIS — Z01812 Encounter for preprocedural laboratory examination: Secondary | ICD-10-CM | POA: Diagnosis not present

## 2015-10-08 DIAGNOSIS — Z09 Encounter for follow-up examination after completed treatment for conditions other than malignant neoplasm: Secondary | ICD-10-CM | POA: Diagnosis not present

## 2015-10-08 DIAGNOSIS — C50412 Malignant neoplasm of upper-outer quadrant of left female breast: Secondary | ICD-10-CM | POA: Diagnosis not present

## 2015-10-08 LAB — BASIC METABOLIC PANEL
Anion gap: 9 (ref 5–15)
BUN: 16 mg/dL (ref 6–20)
CHLORIDE: 101 mmol/L (ref 101–111)
CO2: 27 mmol/L (ref 22–32)
CREATININE: 0.64 mg/dL (ref 0.44–1.00)
Calcium: 9.3 mg/dL (ref 8.9–10.3)
GFR calc Af Amer: >60 mL/min (ref >60)
GFR calc non Af Amer: >60 mL/min (ref >60)
Glucose, Bld: 159 mg/dL — ABNORMAL HIGH (ref 65–99)
Potassium: 3.9 mmol/L (ref 3.5–5.1)
SODIUM: 137 mmol/L (ref 135–145)

## 2015-10-08 LAB — CBC
HCT: 40.2 % (ref 36.0–46.0)
Hemoglobin: 13.2 g/dL (ref 12.0–15.0)
MCH: 30.6 pg (ref 26.0–34.0)
MCHC: 32.8 g/dL (ref 30.0–36.0)
MCV: 93.1 fL (ref 78.0–100.0)
Platelets: 300 K/uL (ref 150–400)
RBC: 4.32 MIL/uL (ref 3.87–5.11)
RDW: 13.1 % (ref 11.5–15.5)
WBC: 8.2 K/uL (ref 4.0–10.5)

## 2015-10-08 NOTE — Progress Notes (Signed)
Echocardiogram 2D Echocardiogram has been performed.  Joelene Millin 10/08/2015, 10:18 AM

## 2015-10-09 ENCOUNTER — Ambulatory Visit (HOSPITAL_COMMUNITY)
Admission: RE | Admit: 2015-10-09 | Discharge: 2015-10-09 | Disposition: A | Discharge status: Home / Self Care | Payer: Medicare Other | Source: Ambulatory Visit | Attending: General Surgery | Admitting: General Surgery

## 2015-10-09 ENCOUNTER — Encounter (HOSPITAL_COMMUNITY): Payer: Self-pay | Admitting: *Deleted

## 2015-10-09 ENCOUNTER — Ambulatory Visit (HOSPITAL_COMMUNITY): Payer: Medicare Other | Admitting: Anesthesiology

## 2015-10-09 ENCOUNTER — Ambulatory Visit (HOSPITAL_COMMUNITY): Payer: Medicare Other

## 2015-10-09 ENCOUNTER — Encounter (HOSPITAL_COMMUNITY)
Admission: RE | Disposition: A | Discharge status: Home / Self Care | Payer: Self-pay | Source: Ambulatory Visit | Attending: General Surgery

## 2015-10-09 DIAGNOSIS — E785 Hyperlipidemia, unspecified: Secondary | ICD-10-CM | POA: Diagnosis not present

## 2015-10-09 DIAGNOSIS — Z7982 Long term (current) use of aspirin: Secondary | ICD-10-CM | POA: Diagnosis not present

## 2015-10-09 DIAGNOSIS — Z79899 Other long term (current) drug therapy: Secondary | ICD-10-CM | POA: Diagnosis not present

## 2015-10-09 DIAGNOSIS — C50912 Malignant neoplasm of unspecified site of left female breast: Secondary | ICD-10-CM | POA: Diagnosis not present

## 2015-10-09 DIAGNOSIS — I10 Essential (primary) hypertension: Secondary | ICD-10-CM | POA: Diagnosis not present

## 2015-10-09 DIAGNOSIS — Z7951 Long term (current) use of inhaled steroids: Secondary | ICD-10-CM | POA: Insufficient documentation

## 2015-10-09 DIAGNOSIS — Z170 Estrogen receptor positive status [ER+]: Secondary | ICD-10-CM | POA: Insufficient documentation

## 2015-10-09 DIAGNOSIS — Z803 Family history of malignant neoplasm of breast: Secondary | ICD-10-CM | POA: Insufficient documentation

## 2015-10-09 DIAGNOSIS — Z452 Encounter for adjustment and management of vascular access device: Secondary | ICD-10-CM | POA: Diagnosis not present

## 2015-10-09 DIAGNOSIS — Z95828 Presence of other vascular implants and grafts: Secondary | ICD-10-CM

## 2015-10-09 HISTORY — PX: PORTACATH PLACEMENT: SHX2246

## 2015-10-09 SURGERY — INSERTION, TUNNELED CENTRAL VENOUS DEVICE, WITH PORT
Anesthesia: General | Laterality: Bilateral

## 2015-10-09 MED ORDER — DEXAMETHASONE SODIUM PHOSPHATE 10 MG/ML IJ SOLN
INTRAMUSCULAR | Status: AC
  Filled 2015-10-09: qty 1

## 2015-10-09 MED ORDER — HEPARIN SOD (PORK) LOCK FLUSH 100 UNIT/ML IV SOLN
INTRAVENOUS | Status: AC
  Filled 2015-10-09: qty 5

## 2015-10-09 MED ORDER — ONDANSETRON HCL 4 MG/2ML IJ SOLN
INTRAMUSCULAR | Status: AC
  Filled 2015-10-09: qty 2

## 2015-10-09 MED ORDER — LIDOCAINE HCL (CARDIAC) 20 MG/ML IV SOLN
INTRAVENOUS | Status: AC
  Filled 2015-10-09: qty 5

## 2015-10-09 MED ORDER — PROPOFOL 10 MG/ML IV BOLUS
INTRAVENOUS | Status: DC | PRN
  Administered 2015-10-09: 160 mg via INTRAVENOUS

## 2015-10-09 MED ORDER — MIDAZOLAM HCL 2 MG/2ML IJ SOLN
INTRAMUSCULAR | Status: AC
  Filled 2015-10-09: qty 2

## 2015-10-09 MED ORDER — FENTANYL CITRATE (PF) 100 MCG/2ML IJ SOLN
INTRAMUSCULAR | Status: AC
  Filled 2015-10-09: qty 2

## 2015-10-09 MED ORDER — MEPERIDINE HCL 50 MG/ML IJ SOLN
6.2500 mg | INTRAMUSCULAR | Status: DC | PRN
Start: 2015-10-09 — End: 2015-10-09

## 2015-10-09 MED ORDER — PHENYLEPHRINE HCL 10 MG/ML IJ SOLN
INTRAMUSCULAR | Status: DC | PRN
  Administered 2015-10-09 (×5): 80 ug via INTRAVENOUS

## 2015-10-09 MED ORDER — OXYCODONE HCL 5 MG/5ML PO SOLN
5.0000 mg | Freq: Once | ORAL | Status: DC | PRN
  Filled 2015-10-09: qty 5

## 2015-10-09 MED ORDER — PHENYLEPHRINE 40 MCG/ML (10ML) SYRINGE FOR IV PUSH (FOR BLOOD PRESSURE SUPPORT)
PREFILLED_SYRINGE | INTRAVENOUS | Status: AC
  Filled 2015-10-09: qty 10

## 2015-10-09 MED ORDER — PROPOFOL 10 MG/ML IV BOLUS
INTRAVENOUS | Status: AC
  Filled 2015-10-09: qty 20

## 2015-10-09 MED ORDER — BUPIVACAINE-EPINEPHRINE 0.25% -1:200000 IJ SOLN
INTRAMUSCULAR | Status: DC | PRN
  Administered 2015-10-09: 10 mL

## 2015-10-09 MED ORDER — LIDOCAINE HCL (CARDIAC) 20 MG/ML IV SOLN
INTRAVENOUS | Status: DC | PRN
  Administered 2015-10-09: 100 mg via INTRAVENOUS

## 2015-10-09 MED ORDER — HEPARIN SOD (PORK) LOCK FLUSH 100 UNIT/ML IV SOLN
INTRAVENOUS | Status: DC | PRN
  Administered 2015-10-09: 500 [IU] via INTRAVENOUS

## 2015-10-09 MED ORDER — FENTANYL CITRATE (PF) 100 MCG/2ML IJ SOLN
INTRAMUSCULAR | Status: DC | PRN
  Administered 2015-10-09: 50 ug via INTRAVENOUS

## 2015-10-09 MED ORDER — SODIUM CHLORIDE 0.9 % IV SOLN
Freq: Once | INTRAVENOUS | Status: AC
  Administered 2015-10-09: 500 mL
  Filled 2015-10-09: qty 1.2

## 2015-10-09 MED ORDER — CEFAZOLIN SODIUM-DEXTROSE 2-3 GM-% IV SOLR
INTRAVENOUS | Status: AC
  Filled 2015-10-09: qty 50

## 2015-10-09 MED ORDER — PROMETHAZINE HCL 25 MG/ML IJ SOLN: 6.2500 mg | INTRAMUSCULAR | Status: DC | PRN

## 2015-10-09 MED ORDER — ONDANSETRON HCL 4 MG/2ML IJ SOLN
INTRAMUSCULAR | Status: DC | PRN
  Administered 2015-10-09: 4 mg via INTRAVENOUS

## 2015-10-09 MED ORDER — OXYCODONE HCL 5 MG PO TABS: 5.0000 mg | ORAL_TABLET | Freq: Once | ORAL | Status: DC | PRN

## 2015-10-09 MED ORDER — MIDAZOLAM HCL 5 MG/5ML IJ SOLN
INTRAMUSCULAR | Status: DC | PRN
  Administered 2015-10-09: 2 mg via INTRAVENOUS

## 2015-10-09 MED ORDER — BUPIVACAINE-EPINEPHRINE 0.25% -1:200000 IJ SOLN
INTRAMUSCULAR | Status: AC
  Filled 2015-10-09: qty 1

## 2015-10-09 MED ORDER — LACTATED RINGERS IV SOLN
INTRAVENOUS | Status: DC
  Administered 2015-10-09: 1000 mL via INTRAVENOUS

## 2015-10-09 MED ORDER — HYDROMORPHONE HCL 1 MG/ML IJ SOLN: 0.2500 mg | INTRAMUSCULAR | Status: DC | PRN

## 2015-10-09 MED ORDER — CEFAZOLIN SODIUM-DEXTROSE 2-3 GM-% IV SOLR
2.0000 g | INTRAVENOUS | Status: AC
  Administered 2015-10-09: 2 g via INTRAVENOUS

## 2015-10-09 MED ORDER — HYDROCODONE-ACETAMINOPHEN 5-325 MG PO TABS: 1.0000 | ORAL_TABLET | ORAL | Status: DC | PRN

## 2015-10-09 MED ORDER — DEXAMETHASONE SODIUM PHOSPHATE 10 MG/ML IJ SOLN
INTRAMUSCULAR | Status: DC | PRN
  Administered 2015-10-09: 10 mg via INTRAVENOUS

## 2015-10-09 SURGICAL SUPPLY — 34 items
APL SKNCLS STERI-STRIP NONHPOA (GAUZE/BANDAGES/DRESSINGS)
BAG DECANTER FOR FLEXI CONT (MISCELLANEOUS) ×2 IMPLANT
BENZOIN TINCTURE PRP APPL 2/3 (GAUZE/BANDAGES/DRESSINGS) IMPLANT
BLADE SURG 15 STRL LF DISP TIS (BLADE) ×1 IMPLANT
BLADE SURG 15 STRL SS (BLADE) ×2
CHLORAPREP W/TINT 26ML (MISCELLANEOUS) ×2 IMPLANT
COVER SURGICAL LIGHT HANDLE (MISCELLANEOUS) ×1 IMPLANT
DECANTER SPIKE VIAL GLASS SM (MISCELLANEOUS) ×2 IMPLANT
DRAPE C-ARM 42X120 X-RAY (DRAPES) ×2 IMPLANT
DRAPE LAPAROSCOPIC ABDOMINAL (DRAPES) ×2 IMPLANT
DRSG TEGADERM 4X4.75 (GAUZE/BANDAGES/DRESSINGS) ×1 IMPLANT
ELECT PENCIL ROCKER SW 15FT (MISCELLANEOUS) ×2 IMPLANT
ELECT REM PT RETURN 9FT ADLT (ELECTROSURGICAL) ×2
ELECTRODE REM PT RTRN 9FT ADLT (ELECTROSURGICAL) ×1 IMPLANT
GAUZE SPONGE 2X2 8PLY STRL LF (GAUZE/BANDAGES/DRESSINGS) IMPLANT
GAUZE SPONGE 4X4 12PLY STRL (GAUZE/BANDAGES/DRESSINGS) IMPLANT
GAUZE SPONGE 4X4 16PLY XRAY LF (GAUZE/BANDAGES/DRESSINGS) ×2 IMPLANT
GLOVE BIOGEL PI IND STRL 7.5 (GLOVE) ×1 IMPLANT
GLOVE BIOGEL PI INDICATOR 7.5 (GLOVE) ×1
GLOVE ECLIPSE 7.5 STRL STRAW (GLOVE) ×2 IMPLANT
GOWN STRL REUS W/TWL XL LVL3 (GOWN DISPOSABLE) ×4 IMPLANT
KIT BASIN OR (CUSTOM PROCEDURE TRAY) ×2 IMPLANT
KIT PORT POWER 8FR ISP CVUE (Catheter) ×2 IMPLANT
LIQUID BAND (GAUZE/BANDAGES/DRESSINGS) ×1 IMPLANT
NEEDLE HYPO 22GX1.5 SAFETY (NEEDLE) ×2 IMPLANT
PACK BASIC VI WITH GOWN DISP (CUSTOM PROCEDURE TRAY) ×2 IMPLANT
SPONGE GAUZE 2X2 STER 10/PKG (GAUZE/BANDAGES/DRESSINGS) ×1
STRIP CLOSURE SKIN 1/2X4 (GAUZE/BANDAGES/DRESSINGS) IMPLANT
SUT MNCRL AB 4-0 PS2 18 (SUTURE) ×2 IMPLANT
SUT PROLENE 2 0 CT2 30 (SUTURE) ×2 IMPLANT
SYR 10ML ECCENTRIC (SYRINGE) ×2 IMPLANT
SYR CONTROL 10ML LL (SYRINGE) ×2 IMPLANT
TOWEL OR 17X26 10 PK STRL BLUE (TOWEL DISPOSABLE) ×2 IMPLANT
TOWEL OR NON WOVEN STRL DISP B (DISPOSABLE) ×2 IMPLANT

## 2015-10-09 NOTE — Anesthesia Preprocedure Evaluation (Signed)
Anesthesia Evaluation  Patient identified by MRN, date of birth, ID band Patient awake    Reviewed: Allergy & Precautions, NPO status , Patient's Chart, lab work & pertinent test results  History of Anesthesia Complications (+) PONV  Airway Mallampati: I  TM Distance: >3 FB Neck ROM: Full    Dental  (+) Teeth Intact, Dental Advisory Given   Pulmonary    breath sounds clear to auscultation       Cardiovascular hypertension, Pt. on medications  Rhythm:Regular Rate:Normal     Neuro/Psych    GI/Hepatic   Endo/Other    Renal/GU      Musculoskeletal   Abdominal   Peds  Hematology   Anesthesia Other Findings Breast cancer  Reproductive/Obstetrics                             Anesthesia Physical Anesthesia Plan  ASA: II  Anesthesia Plan: General   Post-op Pain Management:    Induction: Intravenous  Airway Management Planned: LMA  Additional Equipment:   Intra-op Plan:   Post-operative Plan: Extubation in OR  Informed Consent: I have reviewed the patients History and Physical, chart, labs and discussed the procedure including the risks, benefits and alternatives for the proposed anesthesia with the patient or authorized representative who has indicated his/her understanding and acceptance.   Dental advisory given  Plan Discussed with: CRNA, Anesthesiologist and Surgeon  Anesthesia Plan Comments:         Anesthesia Quick Evaluation

## 2015-10-09 NOTE — Anesthesia Postprocedure Evaluation (Signed)
Anesthesia Post Note  Patient: Jacqueline Jenkins  Procedure(s) Performed: Procedure(s) (LRB): INSERTION PORT-A-CATH (Bilateral)  Patient location during evaluation: PACU Anesthesia Type: General Level of consciousness: awake and alert Pain management: pain level controlled Vital Signs Assessment: post-procedure vital signs reviewed and stable Respiratory status: spontaneous breathing, nonlabored ventilation and respiratory function stable Cardiovascular status: blood pressure returned to baseline and stable Postop Assessment: no signs of nausea or vomiting Anesthetic complications: no    Last Vitals:  Filed Vitals:   10/09/15 1110 10/09/15 1409  BP: 133/89 113/63  Pulse: 79 76  Temp: 36.4 C 36.4 C  Resp: 20 16    Last Pain:  Filed Vitals:   10/09/15 1413  PainSc: 0-No pain                 Hilary Pundt A

## 2015-10-09 NOTE — Op Note (Signed)
Preoperative diagnosis: Cancer of the breast and the poor venous access  Postoperative diagnosis: Same  Procedure: Placement of ClearVue subcutaneous venous port  Surgeon: Excell Seltzer M.D.  Anesthesia: LMA general  Description of procedure: Patient is brought to the operating room and placed in the supine position on the operating table. General anesthesia was administered. The entire upper chest and neck were widely sterilely prepped and draped. Local anesthesia was used to infiltrate the insertion of port site. The right subclavian vein was cannulated with a needle and guidewire without difficulty and position in the superior vena cava was confirmed by fluoroscopy. The introducer was then placed over the guidewire and the flushed catheter placed via the introducer which was stripped away and the tip of the catheter positioned near the cavoatrial junction.  Venous pressure return was confirmed in the catheter.  A small transverse incision was made in the anterior chest wall and subcutaneous pocket created. The catheter was tunneled into the pocket, trimmed to length, and attached to the flushed port which was positioned in the pocket. The port was sutured to the chest wall with interrupted 2-0 Prolene. The incisions were closed with subcutaneous interrupted Monocryl and the skin incisions closed with subcuticular Monocryl and Dermabond. The port was accessed and flushed and aspirated easily and was left flushed with concentrated heparin solution. Sponge needle as the counts were correct. The patient was taken to recovery in good condition.  Stellarose Cerny T  10/09/2015

## 2015-10-09 NOTE — H&P (Signed)
Jacqueline Jenkins is an 65 y.o. female.    Chief Complaint: breast cancer for Port-A-Cath placement  HPI:  Patient has a recent diagnosis of cancer of the left breast status post left breast lumpectomy and sentinel lymph node biopsy.  Results from surgery work: Left lumpectomy 08/29/2015: Invasive ductal carcinoma 1.2 cm with LVID, with DCIS intermediate grade, 1 sentinel node positive, ER 100%, PR 10%, HER-2 negative ratio 1.79, Ki-67 15% , margins negative, Mammaprint high risk, luminal B, T1cN1 stage II a.  We will require chemotherapy and needs long-term venous access   Past Medical History  Diagnosis Date  . Hypertension   . Hyperlipidemia   . DDD (degenerative disc disease)   . Vitamin D deficiency   . Allergic rhinitis, cause unspecified   . Osteopenia 12/2011    Spine T -0.4, Femur -2.1  . Splenic artery aneurysm ([place]) 2013    COILS DONE  . Cancer (HCC)     LEFT  . PONV (postoperative nausea and vomiting)     RECENT SURGERY NO PONV    Past Surgical History  Procedure Laterality Date  . Tonsillectomy    . Spine surgery  1998    cervical diskectomy  . Spine surgery  2001, 2010    Lumbar diskectomy  . Cervical disc surgery  1998  . Lumbar disc surgery  2001, 2010  . Splenic aneurysm  02/10/2012    Aneurysm of splenic artery  . Colonoscopy  Jan. 7, 2014  . Visceral angiogram N/A 02/10/2012    Procedure: VISCERAL ANGIOGRAM;  Surgeon: Serafina Mitchell, MD;  Location: [facility] CATH LAB;  Service: Cardiovascular;  Laterality: N/A;  . Breast lumpectomy with radioactive seed and sentinel lymph node biopsy Left 08/29/2015    Procedure: LEFT BREAST LUMPECTOMY WITH RADIOACTIVE SEED WITH AXILLARY SENTINEL LYMPH NODE BIOPSY;  Surgeon: Excell Seltzer, MD;  Location: [place];  Service: General;  Laterality: Left;  . Re-excision of breast lumpectomy Left 09/07/2015    Procedure: RE-EXCISION OF LEFT BREAST LUMPECTOMY;  Surgeon: Excell Seltzer, MD;  Location: [place];  Service: General;  Laterality: Left;    Family History  Problem Relation Age of Onset  . Stroke Mother   . Osteoporosis Mother   . Hyperlipidemia Mother   . Hypertension Mother   . Other Mother     varicose veins  . Deep vein thrombosis Mother   . Diverticulitis Mother   . Breast cancer Maternal Grandmother     dx. 30s-40s; when pt's mother was 10 years old  . Other Father     bleeding problems  . Hypertension Father   . Heart attack Father   . Stroke Father   . Other Sister     one sister had a hysterectomy for fibroids  . Leukemia Maternal Aunt     dx. 50s-60s  . Pancreatic cancer Maternal Uncle     dx. late 60s-early 70s; (x2 maternal uncles)  . Heart attack Paternal Aunt   . Stroke Paternal Aunt   . Stroke Maternal Grandfather   . Heart attack Paternal Grandmother   . Stroke Paternal Grandmother   . Heart Problems Paternal Grandfather   . Colon cancer Maternal Uncle     dx. late 56s  . Heart defect Maternal Aunt     possible  . Breast cancer Cousin     dx. 50s-60s; maternal first  . Heart attack Paternal Uncle   . Stroke Paternal Uncle   . Lung cancer Maternal  Uncle     treated at [facility]; d. 54s   Social History:  reports that she has never smoked. She has never used smokeless tobacco. She reports that she drinks alcohol. She reports that she does not use illicit drugs.  Allergies:  Allergies  Allergen Reactions  . Lescol [Fluvastatin Sodium]     Myalgia  . Lipitor [Atorvastatin]     Increased LFT's  . Vytorin [Ezetimibe-Simvastatin]     myalgia    Medications Prior to Admission  Medication Sig Dispense Refill  . aspirin 81 MG tablet Take 81 mg by mouth daily.    . benazepril-hydrochlorthiazide (LOTENSIN HCT) 20-12.5 MG tablet TAKE 1 TABLET BY MOUTH DAILY. 90 tablet 1  . Calcium Carbonate-Vitamin D (CALCIUM + D PO) Take 1 tablet by mouth 2 (two) times daily.    . Multiple Vitamin (MULTIVITAMIN) tablet Take 1 tablet by mouth at bedtime.      . Omega-3 Fatty Acids (FISH OIL) 1200 MG CAPS Take 1 capsule by mouth daily.     Marland Kitchen dexamethasone (DECADRON) 4 MG tablet Take 1 tablet (4 mg total) by mouth 2 (two) times daily. Take 2 tablets by mouth once a day on the day after chemotherapy and then take 2 tablets two times a day for 2 days. Take with food. 30 tablet 1  . fluticasone (FLONASE) 50 MCG/ACT nasal spray Place 1 spray into both nostrils as needed for allergies or rhinitis.    Marland Kitchen HYDROcodone-acetaminophen (NORCO/VICODIN) 5-325 MG tablet Take 1-2 tablets by mouth every 4 (four) hours as needed for moderate pain or severe pain. (Patient not taking: Reported on 10/02/2015) 30 tablet 0  . lidocaine-prilocaine (EMLA) cream Apply to affected area once 30 g 3  . LORazepam (ATIVAN) 0.5 MG tablet Take 1 tablet (0.5 mg total) by mouth at bedtime. 30 tablet 0  . ondansetron (ZOFRAN) 8 MG tablet Take 1 tablet (8 mg total) by mouth 2 (two) times daily as needed. Start on the third day after chemotherapy. 30 tablet 1  . prochlorperazine (COMPAZINE) 10 MG tablet Take 1 tablet (10 mg total) by mouth every 6 (six) hours as needed (Nausea or vomiting). 30 tablet 1    Results for orders placed or performed during the hospital encounter of 10/08/15 (from the past 48 hour(s))  Basic metabolic panel     Status: Abnormal   Collection Time: 10/08/15  2:30 PM  Result Value Ref Range   Sodium 137 135 - 145 mmol/L   Potassium 3.9 3.5 - 5.1 mmol/L   Chloride 101 101 - 111 mmol/L   CO2 27 22 - 32 mmol/L   Glucose, Bld 159 (H) 65 - 99 mg/dL   BUN 16 6 - 20 mg/dL   Creatinine, Ser 0.64 0.44 - 1.00 mg/dL   Calcium 9.3 8.9 - 10.3 mg/dL   GFR calc non Af Amer >60 >60 mL/min   GFR calc Af Amer >60 >60 mL/min    Comment: (NOTE) The eGFR has been calculated using the CKD EPI equation. This calculation has not been validated in all clinical situations. eGFR's persistently <60 mL/min signify possible Chronic Kidney Disease.    Anion gap 9 5 - 15  CBC     Status:  None   Collection Time: 10/08/15  2:30 PM  Result Value Ref Range   WBC 8.2 4.0 - 10.5 K/uL   RBC 4.32 3.87 - 5.11 MIL/uL   Hemoglobin 13.2 12.0 - 15.0 g/dL   HCT 40.2 36.0 - 46.0 %  MCV 93.1 78.0 - 100.0 fL   MCH 30.6 26.0 - 34.0 pg   MCHC 32.8 30.0 - 36.0 g/dL   RDW 13.1 11.5 - 15.5 %   Platelets 300 150 - 400 K/uL   No results found.  ROS  Blood pressure 133/89, pulse 79, temperature 97.5 F (36.4 C), temperature source Oral, resp. rate 20, height 5' 5"  (1.651 m), weight 76.913 kg (169 lb 9 oz), SpO2 100 %. Physical Exam   Gen.: Appears well Skin: No rash or infection Lungs: Clear breath sounds without wheezing or increased work of breathing Cardiac: Regular rate and rhythm Breasts: Healing incision left breast  without erythema  Assessment/Plan Stage II a ER/PR positive invasive ductal carcinoma left breast, high risk of recurrence score. Plan Port-A-Cath placement for access for chemotherapy. I discussed the procedure with the patient including its nature and indications as well as risks of anesthetic complications, bleeding, infection, pneumothorax and long-term risks of catheter occlusion or displacement and DVT. All her questions were answered and she agrees to proceed.  Edward Jolly, MD 10/09/2015, 1:01 PM

## 2015-10-09 NOTE — Transfer of Care (Signed)
Immediate Anesthesia Transfer of Care Note  Patient: Jacqueline Jenkins  Procedure(s) Performed: Procedure(s): INSERTION PORT-A-CATH (Bilateral)  Patient Location: PACU  Anesthesia Type:General  Level of Consciousness: sedated  Airway & Oxygen Therapy: Patient Spontanous Breathing and Patient connected to face mask oxygen  Post-op Assessment: Report given to RN and Post -op Vital signs reviewed and stable  Post vital signs: Reviewed and stable  Last Vitals:  Filed Vitals:   10/09/15 1110  BP: 133/89  Pulse: 79  Temp: 36.4 C  Resp: 20    Complications: No apparent anesthesia complications

## 2015-10-09 NOTE — Discharge Instructions (Signed)
PORT-A-CATH: POST OP INSTRUCTIONS  Always review your discharge instruction sheet given to you by the facility where your surgery was performed.   1. A prescription for pain medication may be given to you upon discharge. Take your pain medication as prescribed, if needed. If narcotic pain medicine is not needed, then you make take acetaminophen (Tylenol) or ibuprofen (Advil) as needed.  2. Take your usually prescribed medications unless otherwise directed. 3. If you need a refill on your pain medication, please contact our office. All narcotic pain medicine now requires a paper prescription.  Phoned in and fax refills are no longer allowed by law.  Prescriptions will not be filled after 5 pm or on weekends.  4. You should follow a light diet for the remainder of the day after your procedure. 5. Most patients will experience some mild swelling and/or bruising in the area of the incision. It may take several days to resolve. 6. It is common to experience some constipation if taking pain medication after surgery. Increasing fluid intake and taking a stool softener (such as Colace) will usually help or prevent this problem from occurring. A mild laxative (Milk of Magnesia or Miralax) should be taken according to package directions if there are no bowel movements after 48 hours.  7. Unless discharge instructions indicate otherwise, you may remove your bandages 48 hours after surgery, and you may shower at that time. You may have steri-strips (small white skin tapes) in place directly over the incision.  These strips should be left on the skin for 7-10 days.  If your surgeon used Dermabond (skin glue) on the incision, you may shower in 24 hours.  The glue will flake off over the next 2-3 weeks.  8. If your port is left accessed at the end of surgery (needle left in port), the dressing cannot get wet and should only by changed by a healthcare professional. When the port is no longer accessed (when the  needle has been removed), follow step 7.   9. ACTIVITIES:  Limit activity involving your arms for the next 72 hours. Do no strenuous exercise or activity for 1 week. You may drive when you are no longer taking prescription pain medication, you can comfortably wear a seatbelt, and you can maneuver your car. 10.You may need to see your doctor in the office for a follow-up appointment.  Please       check with your doctor.  11.When you receive a new Port-a-Cath, you will get a product guide and        ID card.  Please keep them in case you need them.  WHEN TO CALL YOUR DOCTOR [phone]): 1. Fever over 101.0 2. Chills 3. Continued bleeding from incision 4. Increased redness and tenderness at the site 5. Shortness of breath, difficulty breathing   The clinic staff is available to answer your questions during regular business hours. Please dont hesitate to call and ask to speak to one of the nurses or medical assistants for clinical concerns. If you have a medical emergency, go to the nearest emergency room or call 911.  A surgeon from [facility] is always on call at the hospital.     For further information, please visit [email or web address]  General Anesthesia, Adult, Care After Refer to this sheet in the next few weeks. These instructions provide you with information on caring for yourself after your procedure. Your health care provider may also give you more specific instructions. Your treatment has been  planned according to current medical practices, but problems sometimes occur. Call your health care provider if you have any problems or questions after your procedure. WHAT TO EXPECT AFTER THE PROCEDURE After the procedure, it is typical to experience:  Sleepiness.  Nausea and vomiting. HOME CARE INSTRUCTIONS  For the first 24 hours after general anesthesia:  Have a responsible person with you.  Do not drive a car. If you are alone, do not take public  transportation.  Do not drink alcohol.  Do not take medicine that has not been prescribed by your health care provider.  Do not sign important papers or make important decisions.  You may resume a normal diet and activities as directed by your health care provider.  Change bandages (dressings) as directed.  If you have questions or problems that seem related to general anesthesia, call the hospital and ask for the anesthetist or anesthesiologist on call. SEEK MEDICAL CARE IF:  You have nausea and vomiting that continue the day after anesthesia.  You develop a rash. SEEK IMMEDIATE MEDICAL CARE IF:   You have difficulty breathing.  You have chest pain.  You have any allergic problems.   This information is not intended to replace advice given to you by your health care provider. Make sure you discuss any questions you have with your health care provider.   Document Released: 11/24/2000 Document Revised: 09/08/2014 Document Reviewed: 12/17/2011 Elsevier Interactive Patient Education [employer].

## 2015-10-11 ENCOUNTER — Encounter: Payer: Self-pay | Admitting: *Deleted

## 2015-10-11 ENCOUNTER — Ambulatory Visit (HOSPITAL_BASED_OUTPATIENT_CLINIC_OR_DEPARTMENT_OTHER): Payer: Medicare Other

## 2015-10-11 ENCOUNTER — Other Ambulatory Visit (HOSPITAL_BASED_OUTPATIENT_CLINIC_OR_DEPARTMENT_OTHER): Payer: Medicare Other

## 2015-10-11 ENCOUNTER — Ambulatory Visit (HOSPITAL_BASED_OUTPATIENT_CLINIC_OR_DEPARTMENT_OTHER): Payer: Medicare Other | Admitting: Hematology and Oncology

## 2015-10-11 ENCOUNTER — Encounter: Payer: Self-pay | Admitting: Hematology and Oncology

## 2015-10-11 VITALS — BP 164/75 | HR 86 | Temp 98.0°F | Resp 18 | Ht 65.0 in | Wt 172.9 lb

## 2015-10-11 DIAGNOSIS — C50412 Malignant neoplasm of upper-outer quadrant of left female breast: Secondary | ICD-10-CM | POA: Diagnosis not present

## 2015-10-11 DIAGNOSIS — Z5111 Encounter for antineoplastic chemotherapy: Secondary | ICD-10-CM | POA: Diagnosis not present

## 2015-10-11 LAB — CBC WITH DIFFERENTIAL/PLATELET
BASO%: 0.6 % (ref 0.0–2.0)
BASOS ABS: 0.1 10*3/uL (ref 0.0–0.1)
EOS%: 0.9 % (ref 0.0–7.0)
Eosinophils Absolute: 0.1 10*3/uL (ref 0.0–0.5)
HEMATOCRIT: 37.7 % (ref 34.8–46.6)
HGB: 12.5 g/dL (ref 11.6–15.9)
LYMPH%: 20 % (ref 14.0–49.7)
MCH: 30.3 pg (ref 25.1–34.0)
MCHC: 33.2 g/dL (ref 31.5–36.0)
MCV: 91.2 fL (ref 79.5–101.0)
MONO#: 0.6 10*3/uL (ref 0.1–0.9)
MONO%: 5.4 % (ref 0.0–14.0)
NEUT#: 7.8 10*3/uL — ABNORMAL HIGH (ref 1.5–6.5)
NEUT%: 73.1 % (ref 38.4–76.8)
Platelets: 253 10*3/uL (ref 145–400)
RBC: 4.14 10*6/uL (ref 3.70–5.45)
RDW: 13.1 % (ref 11.2–14.5)
WBC: 10.6 10*3/uL — ABNORMAL HIGH (ref 3.9–10.3)
lymph#: 2.1 10*3/uL (ref 0.9–3.3)

## 2015-10-11 LAB — COMPREHENSIVE METABOLIC PANEL
ALBUMIN: 3.8 g/dL (ref 3.5–5.0)
ALK PHOS: 65 U/L (ref 40–150)
ALT: 17 U/L (ref 0–55)
ANION GAP: 11 meq/L (ref 3–11)
AST: 16 U/L (ref 5–34)
BUN: 19.6 mg/dL (ref 7.0–26.0)
CALCIUM: 9.2 mg/dL (ref 8.4–10.4)
CHLORIDE: 102 meq/L (ref 98–109)
CO2: 24 mEq/L (ref 22–29)
Creatinine: 0.8 mg/dL (ref 0.6–1.1)
EGFR: 75 mL/min/{1.73_m2} — ABNORMAL LOW (ref >90)
Glucose: 110 mg/dl (ref 70–140)
Potassium: 3.4 mEq/L — ABNORMAL LOW (ref 3.5–5.1)
Sodium: 136 mEq/L (ref 136–145)
TOTAL PROTEIN: 7.1 g/dL (ref 6.4–8.3)
Total Bilirubin: <0.3 mg/dL (ref 0.20–1.20)

## 2015-10-11 MED ORDER — DOXORUBICIN HCL CHEMO IV INJECTION 2 MG/ML
60.0000 mg/m2 | Freq: Once | INTRAVENOUS | Status: AC
  Administered 2015-10-11: 112 mg via INTRAVENOUS
  Filled 2015-10-11: qty 56

## 2015-10-11 MED ORDER — HEPARIN SOD (PORK) LOCK FLUSH 100 UNIT/ML IV SOLN
500.0000 [IU] | Freq: Once | INTRAVENOUS | Status: AC | PRN
  Administered 2015-10-11: 500 [IU]
  Filled 2015-10-11: qty 5

## 2015-10-11 MED ORDER — PALONOSETRON HCL INJECTION 0.25 MG/5ML
INTRAVENOUS | Status: AC
  Filled 2015-10-11: qty 5

## 2015-10-11 MED ORDER — SODIUM CHLORIDE 0.9 % IV SOLN
600.0000 mg/m2 | Freq: Once | INTRAVENOUS | Status: AC
  Administered 2015-10-11: 1120 mg via INTRAVENOUS
  Filled 2015-10-11: qty 56

## 2015-10-11 MED ORDER — PEGFILGRASTIM 6 MG/0.6ML ~~LOC~~ PSKT
6.0000 mg | PREFILLED_SYRINGE | Freq: Once | SUBCUTANEOUS | Status: AC
  Administered 2015-10-11: 6 mg via SUBCUTANEOUS
  Filled 2015-10-11: qty 0.6

## 2015-10-11 MED ORDER — SODIUM CHLORIDE 0.9% FLUSH
10.0000 mL | INTRAVENOUS | Status: DC | PRN
  Administered 2015-10-11: 10 mL
  Filled 2015-10-11: qty 10

## 2015-10-11 MED ORDER — SODIUM CHLORIDE 0.9 % IV SOLN
Freq: Once | INTRAVENOUS | Status: AC
  Administered 2015-10-11: 12:00:00 via INTRAVENOUS
  Filled 2015-10-11: qty 5

## 2015-10-11 MED ORDER — SODIUM CHLORIDE 0.9 % IV SOLN
Freq: Once | INTRAVENOUS | Status: AC
  Administered 2015-10-11: 12:00:00 via INTRAVENOUS

## 2015-10-11 MED ORDER — PALONOSETRON HCL INJECTION 0.25 MG/5ML
0.2500 mg | Freq: Once | INTRAVENOUS | Status: AC
  Administered 2015-10-11: 0.25 mg via INTRAVENOUS

## 2015-10-11 NOTE — Patient Instructions (Signed)
Cyclophosphamide injection What is this medicine? CYCLOPHOSPHAMIDE (sye kloe FOSS fa mide) is a chemotherapy drug. It slows the growth of cancer cells. This medicine is used to treat many types of cancer like lymphoma, myeloma, leukemia, breast cancer, and ovarian cancer, to name a few. This medicine may be used for other purposes; ask your health care provider or pharmacist if you have questions. What should I tell my health care provider before I take this medicine? They need to know if you have any of these conditions: -blood disorders -history of other chemotherapy -infection -kidney disease -liver disease -recent or ongoing radiation therapy -tumors in the bone marrow -an unusual or allergic reaction to cyclophosphamide, other chemotherapy, other medicines, foods, dyes, or preservatives -pregnant or trying to get pregnant -breast-feeding How should I use this medicine? This drug is usually given as an injection into a vein or muscle or by infusion into a vein. It is administered in a hospital or clinic by a specially trained health care professional. Talk to your pediatrician regarding the use of this medicine in children. Special care may be needed. Overdosage: If you think you have taken too much of this medicine contact a poison control center or emergency room at once. NOTE: This medicine is only for you. Do not share this medicine with others. What if I miss a dose? It is important not to miss your dose. Call your doctor or health care professional if you are unable to keep an appointment. What may interact with this medicine? This medicine may interact with the following medications: -amiodarone -amphotericin B -azathioprine -certain antiviral medicines for HIV or AIDS such as protease inhibitors (e.g., indinavir, ritonavir) and zidovudine -certain blood pressure medications such as benazepril, captopril, enalapril, fosinopril, lisinopril, moexipril, monopril, perindopril,  quinapril, ramipril, trandolapril -certain cancer medications such as anthracyclines (e.g., daunorubicin, doxorubicin), busulfan, cytarabine, paclitaxel, pentostatin, tamoxifen, trastuzumab -certain diuretics such as chlorothiazide, chlorthalidone, hydrochlorothiazide, indapamide, metolazone -certain medicines that treat or prevent blood clots like warfarin -certain muscle relaxants such as succinylcholine -cyclosporine -etanercept -indomethacin -medicines to increase blood counts like filgrastim, pegfilgrastim, sargramostim -medicines used as general anesthesia -metronidazole -natalizumab This list may not describe all possible interactions. Give your health care provider a list of all the medicines, herbs, non-prescription drugs, or dietary supplements you use. Also tell them if you smoke, drink alcohol, or use illegal drugs. Some items may interact with your medicine. What should I watch for while using this medicine? Visit your doctor for checks on your progress. This drug may make you feel generally unwell. This is not uncommon, as chemotherapy can affect healthy cells as well as cancer cells. Report any side effects. Continue your course of treatment even though you feel ill unless your doctor tells you to stop. Drink water or other fluids as directed. Urinate often, even at night. In some cases, you may be given additional medicines to help with side effects. Follow all directions for their use. Call your doctor or health care professional for advice if you get a fever, chills or sore throat, or other symptoms of a cold or flu. Do not treat yourself. This drug decreases your body's ability to fight infections. Try to avoid being around people who are sick. This medicine may increase your risk to bruise or bleed. Call your doctor or health care professional if you notice any unusual bleeding. Be careful brushing and flossing your teeth or using a toothpick because you may get an infection or  bleed more easily. If you have  medicine may increase your risk to bruise or bleed. Call your doctor or health care professional if you notice any unusual bleeding.  Be careful brushing and flossing your teeth or using a toothpick because you may get an infection or  bleed more easily. If you have any dental work done, tell your dentist you are receiving this medicine.  You may get drowsy or dizzy. Do not drive, use machinery, or do anything that needs mental alertness until you know how this medicine affects you.  Do not become pregnant while taking this medicine or for 1 year after stopping it. Women should inform their doctor if they wish to become pregnant or think they might be pregnant. Men should not father a child while taking this medicine and for 4 months after stopping it. There is a potential for serious side effects to an unborn child. Talk to your health care professional or pharmacist for more information. Do not breast-feed an infant while taking this medicine.  This medicine may interfere with the ability to have a child. This medicine has caused ovarian failure in some women. This medicine has caused reduced sperm counts in some men. You should talk with your doctor or health care professional if you are concerned about your fertility.  If you are going to have surgery, tell your doctor or health care professional that you have taken this medicine.  What side effects may I notice from receiving this medicine?  Side effects that you should report to your doctor or health care professional as soon as possible:  -allergic reactions like skin rash, itching or hives, swelling of the face, lips, or tongue  -low blood counts - this medicine may decrease the number of white blood cells, red blood cells and platelets. You may be at increased risk for infections and bleeding.  -signs of infection - fever or chills, cough, sore throat, pain or difficulty passing urine  -signs of decreased platelets or bleeding - bruising, pinpoint red spots on the skin, black, tarry stools, blood in the urine  -signs of decreased red blood cells - unusually weak or tired, fainting spells, lightheadedness  -breathing problems  -dark urine  -dizziness  -palpitations  -swelling of the  ankles, feet, hands  -trouble passing urine or change in the amount of urine  -weight gain  -yellowing of the eyes or skin  Side effects that usually do not require medical attention (report to your doctor or health care professional if they continue or are bothersome):  -changes in nail or skin color  -hair loss  -missed menstrual periods  -mouth sores  -nausea, vomiting  This list may not describe all possible side effects. Call your doctor for medical advice about side effects. You may report side effects to FDA at 1-800-FDA-1088.  Where should I keep my medicine?  This drug is given in a hospital or clinic and will not be stored at home.  NOTE: This sheet is a summary. It may not cover all possible information. If you have questions about this medicine, talk to your doctor, pharmacist, or health care provider.      2016, [employer]. (2012-07-02 16:22:58)  Doxorubicin injection  What is this medicine?  DOXORUBICIN (dox oh ROO bi sin) is a chemotherapy drug. It is used to treat many kinds of cancer like Hodgkin's disease, leukemia, non-Hodgkin's lymphoma, neuroblastoma, sarcoma, and Wilms' tumor. It is also used to treat bladder cancer, breast cancer, lung cancer, ovarian cancer, stomach cancer, and thyroid cancer.    This medicine may be used for other purposes; ask your health care provider or pharmacist if you have questions.  What should I tell my health care provider before I take this medicine?  They need to know if you have any of these conditions:  -blood disorders  -heart disease, recent heart attack  -infection (especially a virus infection such as chickenpox, cold sores, or herpes)  -irregular heartbeat  -liver disease  -recent or ongoing radiation therapy  -an unusual or allergic reaction to doxorubicin, other chemotherapy agents, other medicines, foods, dyes, or preservatives  -pregnant or trying to get pregnant  -breast-feeding  How should I use this medicine?  This drug is given as an  infusion into a vein. It is administered in a hospital or clinic by a specially trained health care professional. If you have pain, swelling, burning or any unusual feeling around the site of your injection, tell your health care professional right away.  Talk to your pediatrician regarding the use of this medicine in children. Special care may be needed.  Overdosage: If you think you have taken too much of this medicine contact a poison control center or emergency room at once.  NOTE: This medicine is only for you. Do not share this medicine with others.  What if I miss a dose?  It is important not to miss your dose. Call your doctor or health care professional if you are unable to keep an appointment.  What may interact with this medicine?  Do not take this medicine with any of the following medications:  -cisapride  -droperidol  -halofantrine  -pimozide  -zidovudine  This medicine may also interact with the following medications:  -chloroquine  -chlorpromazine  -clarithromycin  -cyclophosphamide  -cyclosporine  -erythromycin  -medicines for depression, anxiety, or psychotic disturbances  -medicines for irregular heart beat like amiodarone, bepridil, dofetilide, encainide, flecainide, propafenone, quinidine  -medicines for seizures like ethotoin, fosphenytoin, phenytoin  -medicines for nausea, vomiting like dolasetron, ondansetron, palonosetron  -medicines to increase blood counts like filgrastim, pegfilgrastim, sargramostim  -methadone  -methotrexate  -pentamidine  -progesterone  -vaccines  -verapamil  Talk to your doctor or health care professional before taking any of these medicines:  -acetaminophen  -aspirin  -ibuprofen  -ketoprofen  -naproxen  This list may not describe all possible interactions. Give your health care provider a list of all the medicines, herbs, non-prescription drugs, or dietary supplements you use. Also tell them if you smoke, drink alcohol, or use illegal drugs. Some items may interact  with your medicine.  What should I watch for while using this medicine?  Your condition will be monitored carefully while you are receiving this medicine. You will need important blood work done while you are taking this medicine.  This drug may make you feel generally unwell. This is not uncommon, as chemotherapy can affect healthy cells as well as cancer cells. Report any side effects. Continue your course of treatment even though you feel ill unless your doctor tells you to stop.  Your urine may turn red for a few days after your dose. This is not blood. If your urine is dark or brown, call your doctor.  In some cases, you may be given additional medicines to help with side effects. Follow all directions for their use.  Call your doctor or health care professional for advice if you get a fever, chills or sore throat, or other symptoms of a cold or flu. Do not treat yourself. This drug decreases your body's ability   to fight infections. Try to avoid being around people who are sick.  This medicine may increase your risk to bruise or bleed. Call your doctor or health care professional if you notice any unusual bleeding.  Be careful brushing and flossing your teeth or using a toothpick because you may get an infection or bleed more easily. If you have any dental work done, tell your dentist you are receiving this medicine.  Avoid taking products that contain aspirin, acetaminophen, ibuprofen, naproxen, or ketoprofen unless instructed by your doctor. These medicines may hide a fever.  Men and women of childbearing age should use effective birth control methods while using taking this medicine. Do not become pregnant while taking this medicine. There is a potential for serious side effects to an unborn child. Talk to your health care professional or pharmacist for more information. Do not breast-feed an infant while taking this medicine.  Do not let others touch your urine or other body fluids for 5 days after each  treatment with this medicine. Caregivers should wear latex gloves to avoid touching body fluids during this time.  There is a maximum amount of this medicine you should receive throughout your life. The amount depends on the medical condition being treated and your overall health. Your doctor will watch how much of this medicine you receive in your lifetime. Tell your doctor if you have taken this medicine before.  What side effects may I notice from receiving this medicine?  Side effects that you should report to your doctor or health care professional as soon as possible:  -allergic reactions like skin rash, itching or hives, swelling of the face, lips, or tongue  -low blood counts - this medicine may decrease the number of white blood cells, red blood cells and platelets. You may be at increased risk for infections and bleeding.  -signs of infection - fever or chills, cough, sore throat, pain or difficulty passing urine  -signs of decreased platelets or bleeding - bruising, pinpoint red spots on the skin, black, tarry stools, blood in the urine  -signs of decreased red blood cells - unusually weak or tired, fainting spells, lightheadedness  -breathing problems  -chest pain  -fast, irregular heartbeat  -mouth sores  -nausea, vomiting  -pain, swelling, redness at site where injected  -pain, tingling, numbness in the hands or feet  -swelling of ankles, feet, or hands  -unusual bleeding or bruising  Side effects that usually do not require medical attention (report to your doctor or health care professional if they continue or are bothersome):  -diarrhea  -facial flushing  -hair loss  -loss of appetite  -missed menstrual periods  -nail discoloration or damage  -red or watery eyes  -red colored urine  -stomach upset  This list may not describe all possible side effects. Call your doctor for medical advice about side effects. You may report side effects to FDA at 1-800-FDA-1088.  Where should I keep my medicine?  This  drug is given in a hospital or clinic and will not be stored at home.  NOTE: This sheet is a summary. It may not cover all possible information. If you have questions about this medicine, talk to your doctor, pharmacist, or health care provider.      2016, [employer]. (2012-12-14 09:54:34)

## 2015-10-11 NOTE — Assessment & Plan Note (Signed)
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 Patient does not need axillary lymph node dissection based on current NCCN guidelines and ACOSOG Z 11 clinical trial; luminal type B and high-risk phenotype. Risk of relapse without chemotherapy 24% versus 12% with chemotherapy.  Treatment plan: 1. Dose dense Adriamycin and Cytoxan 4 followed by Abraxane weekly 12 2. Adjuvant radiation therapy followed by 3. Adjuvant antiestrogen therapy ----------------------------------------------------------------------------------------------------------------- Current treatment: Cycle 1 day 1 dose dense Adriamycin and Cytoxan I reviewed her antiemetic regimen in great detail. Echocardiogram showed an ejection fraction of 55-60%  Patient is very anxious because her husband has Parkinson's plus disease and requires a lot of assistance at home. With her current chemotherapy she is worried that she could get weaker and not be able to take care of her husband.  Return to clinic in 1 week for toxicity check and follow-up.

## 2015-10-11 NOTE — Progress Notes (Signed)
Patient Care Team: Unk Pinto, MD as PCP - General (Internal Medicine) Ralene Bathe, MD as Consulting Physician (Ophthalmology) Serafina Mitchell, MD as Consulting Physician (Vascular Surgery) Carol Ada, MD as Consulting Physician (Gastroenterology) Amy Martinique, MD as Consulting Physician (Dermatology) Nicholas Lose, MD as Consulting Physician (Hematology and Oncology) Excell Seltzer, MD as Consulting Physician (General Surgery)  SUMMARY OF ONCOLOGIC HISTORY:   Breast cancer of upper-outer quadrant of left female breast ([place])   08/03/2015 Mammogram Left Breast: 6 mm mass @ 1 o clock   08/09/2015 Initial Diagnosis Left breast 1:00 position: Invasive ductal carcinoma grade 1-2, ER 100%, PR 10%, HER-2 negative ratio 1.79, Ki-67 15%, T1 BN 0 stage I a clinical stage   08/29/2015 Surgery Left lumpectomy: invasive ductal carcinoma 1.2 cm with LVID, with DCIS intermediate grade, 1 sentinel node positive, ER 100%, PR 10%, HER-2 negative ratio 1.79, Ki-67 15% , margins negative, Mammaprint high risk, luminal B, T1cN1 stage II a   10/11/2015 -  Chemotherapy Adjuvant chemotherapy with dose dense Adriamycin and Cytoxan 4 followed by Abraxane weekly 12    CHIEF COMPLIANT: Cycle 1 day 1 dose dense Adriamycin and Cytoxan  INTERVAL HISTORY: Jacqueline Jenkins is a 65 year old with above-mentioned history of left breast cancer treated with lumpectomy and adjuvant chemotherapy starting today. She is a little anxious and worried. We postponed her surgery for oophorectomy until after chemotherapy is complete.  REVIEW OF SYSTEMS:   Constitutional: Denies fevers, chills or abnormal weight loss Eyes: Denies blurriness of vision Ears, nose, mouth, throat, and face: Denies mucositis or sore throat Respiratory: Denies cough, dyspnea or wheezes Cardiovascular: Denies palpitation, chest discomfort Gastrointestinal:  Denies nausea, heartburn or change in bowel habits Skin: Denies abnormal skin  rashes Lymphatics: Denies new lymphadenopathy or easy bruising Neurological:Denies numbness, tingling or new weaknesses Behavioral/Psych: Mood is stable, no new changes  Extremities: No lower extremity edema Breast:  denies any pain or lumps or nodules in either breasts All other systems were reviewed with the patient and are negative.  I have reviewed the past medical history, past surgical history, social history and family history with the patient and they are unchanged from previous note.  ALLERGIES:  is allergic to lescol; lipitor; and vytorin.  MEDICATIONS:  Current Outpatient Prescriptions  Medication Sig Dispense Refill  . aspirin 81 MG tablet Take 81 mg by mouth daily.    . benazepril-hydrochlorthiazide (LOTENSIN HCT) 20-12.5 MG tablet TAKE 1 TABLET BY MOUTH DAILY. 90 tablet 1  . Calcium Carbonate-Vitamin D (CALCIUM + D PO) Take 1 tablet by mouth 2 (two) times daily.    Marland Kitchen dexamethasone (DECADRON) 4 MG tablet Take 1 tablet (4 mg total) by mouth 2 (two) times daily. Take 2 tablets by mouth once a day on the day after chemotherapy and then take 2 tablets two times a day for 2 days. Take with food. 30 tablet 1  . fluticasone (FLONASE) 50 MCG/ACT nasal spray Place 1 spray into both nostrils as needed for allergies or rhinitis.    Marland Kitchen HYDROcodone-acetaminophen (NORCO/VICODIN) 5-325 MG tablet Take 1-2 tablets by mouth every 4 (four) hours as needed for moderate pain or severe pain. 20 tablet 0  . lidocaine-prilocaine (EMLA) cream Apply to affected area once 30 g 3  . LORazepam (ATIVAN) 0.5 MG tablet Take 1 tablet (0.5 mg total) by mouth at bedtime. 30 tablet 0  . Multiple Vitamin (MULTIVITAMIN) tablet Take 1 tablet by mouth at bedtime.     . Omega-3 Fatty Acids (FISH OIL)  1200 MG CAPS Take 1 capsule by mouth daily.     . ondansetron (ZOFRAN) 8 MG tablet Take 1 tablet (8 mg total) by mouth 2 (two) times daily as needed. Start on the third day after chemotherapy. 30 tablet 1  .  prochlorperazine (COMPAZINE) 10 MG tablet Take 1 tablet (10 mg total) by mouth every 6 (six) hours as needed (Nausea or vomiting). 30 tablet 1   No current facility-administered medications for this visit.   Facility-Administered Medications Ordered in Other Visits  Medication Dose Route Frequency Provider Last Rate Last Dose  . 0.9 %  sodium chloride infusion   Intravenous Once Nicholas Lose, MD      . cyclophosphamide (CYTOXAN) 1,120 mg in sodium chloride 0.9 % 250 mL chemo infusion  600 mg/m2 (Treatment Plan Actual) Intravenous Once Nicholas Lose, MD      . DOXOrubicin (ADRIAMYCIN) chemo injection 112 mg  60 mg/m2 (Treatment Plan Actual) Intravenous Once Nicholas Lose, MD      . fosaprepitant (EMEND) 150 mg, dexamethasone (DECADRON) 12 mg in sodium chloride 0.9 % 145 mL IVPB   Intravenous Once Nicholas Lose, MD      . heparin lock flush 100 unit/mL  500 Units Intracatheter Once PRN Nicholas Lose, MD      . palonosetron (ALOXI) injection 0.25 mg  0.25 mg Intravenous Once Nicholas Lose, MD      . pegfilgrastim (NEULASTA ONPRO KIT) injection 6 mg  6 mg Subcutaneous Once Nicholas Lose, MD      . sodium chloride flush (NS) 0.9 % injection 10 mL  10 mL Intracatheter PRN Nicholas Lose, MD        PHYSICAL EXAMINATION: ECOG PERFORMANCE STATUS: 1 - Symptomatic but completely ambulatory  Filed Vitals:   10/11/15 1102  BP: 164/75  Pulse: 86  Temp: 98 F (36.7 C)  Resp: 18   Filed Weights   10/11/15 1102  Weight: 172 lb 14.4 oz (78.427 kg)    GENERAL:alert, no distress and comfortable SKIN: skin color, texture, turgor are normal, no rashes or significant lesions EYES: normal, Conjunctiva are pink and non-injected, sclera clear OROPHARYNX:no exudate, no erythema and lips, buccal mucosa, and tongue normal  NECK: supple, thyroid normal size, non-tender, without nodularity LYMPH:  no palpable lymphadenopathy in the cervical, axillary or inguinal LUNGS: clear to auscultation and percussion with normal  breathing effort HEART: regular rate & rhythm and no murmurs and no lower extremity edema ABDOMEN:abdomen soft, non-tender and normal bowel sounds MUSCULOSKELETAL:no cyanosis of digits and no clubbing  NEURO: alert & oriented x 3 with fluent speech, no focal motor/sensory deficits EXTREMITIES: No lower extremity edema  LABORATORY DATA:  I have reviewed the data as listed   Chemistry      Component Value Date/Time   NA 136 10/11/2015 1040   NA 137 10/08/2015 1430   K 3.4* 10/11/2015 1040   K 3.9 10/08/2015 1430   CL 101 10/08/2015 1430   CO2 24 10/11/2015 1040   CO2 27 10/08/2015 1430   BUN 19.6 10/11/2015 1040   BUN 16 10/08/2015 1430   CREATININE 0.8 10/11/2015 1040   CREATININE 0.64 10/08/2015 1430   CREATININE 0.62 06/07/2015 1602      Component Value Date/Time   CALCIUM 9.2 10/11/2015 1040   CALCIUM 9.3 10/08/2015 1430   ALKPHOS 65 10/11/2015 1040   ALKPHOS 58 06/07/2015 1602   AST 16 10/11/2015 1040   AST 22 06/07/2015 1602   ALT 17 10/11/2015 1040   ALT 21 06/07/2015  1602   BILITOT <0.30 10/11/2015 1040   BILITOT 0.6 06/07/2015 1602       Lab Results  Component Value Date   WBC 10.6* 10/11/2015   HGB 12.5 10/11/2015   HCT 37.7 10/11/2015   MCV 91.2 10/11/2015   PLT 253 10/11/2015   NEUTROABS 7.8* 10/11/2015     ASSESSMENT & PLAN:  Breast cancer of upper-outer quadrant of left female breast ([place]) Left lumpectomy 08/29/2015: Invasive ductal carcinoma 1.2 cm with LVID, with DCIS intermediate grade, 1 sentinel node positive, ER 100%, PR 10%, HER-2 negative ratio 1.79, Ki-67 15% , margins negative, Mammaprint high risk, luminal B, T1cN1 stage II a. Patient does not need axillary lymph node dissection based on current NCCN guidelines and ACOSOG Z 11 clinical trial; luminal type B and high-risk phenotype. Risk of relapse without chemotherapy 24% versus 12% with chemotherapy.  Treatment plan: 1. Dose dense Adriamycin and Cytoxan 4 followed by Abraxane weekly  12 2. Adjuvant radiation therapy followed by 3. Adjuvant antiestrogen therapy ----------------------------------------------------------------------------------------------------------------- Current treatment: Cycle 1 day 1 dose dense Adriamycin and Cytoxan I reviewed her antiemetic regimen in great detail. Echocardiogram showed an ejection fraction of 55-60% BRCA2 mutation: Oophorectomy will be delayed after chemotherapy.  Patient is very anxious because her husband has Parkinson's plus disease and requires a lot of assistance at home. With her current chemotherapy she is worried that she could get weaker and not be able to take care of her husband.  Return to clinic in 1 week for toxicity check and follow-up.    No orders of the defined types were placed in this encounter.   The patient has a good understanding of the overall plan. she agrees with it. she will call with any problems that may develop before the next visit here.   Rulon Eisenmenger, MD 10/11/2015

## 2015-10-11 NOTE — Progress Notes (Signed)
Adriamycin administered through a free dripping normal saline line. Positive blood return noted before, every 5 mL during and after adriamycin administration. Patient tolerated well.  

## 2015-10-12 ENCOUNTER — Telehealth: Payer: Self-pay | Admitting: *Deleted

## 2015-10-12 NOTE — Telephone Encounter (Signed)
1st AC yesterday. Patient reports that she is eating and drinking well. Denies nausea, vomiting or diarrhea. Advised to call with any questions or concerns. She verbalized understanding.

## 2015-10-13 ENCOUNTER — Telehealth: Payer: Self-pay | Admitting: Hematology and Oncology

## 2015-10-13 NOTE — Telephone Encounter (Signed)
Left message for patient re new appointment for 2/23. Also confirmed 2/16 appointment. Patient to get new schedule 2/16.

## 2015-10-17 NOTE — Assessment & Plan Note (Signed)
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 Patient does not need axillary lymph node dissection based on current NCCN guidelines and ACOSOG Z 11 clinical trial; luminal type B and high-risk phenotype. Risk of relapse without chemotherapy 24% versus 12% with chemotherapy.  Treatment plan: 1. Dose dense Adriamycin and Cytoxan 4 followed by Abraxane weekly 12 2. Adjuvant radiation therapy followed by 3. Adjuvant antiestrogen therapy ----------------------------------------------------------------------------------------------------------------- Current treatment: Cycle 1 day 8 dose dense Adriamycin and Cytoxan Echocardiogram showed an ejection fraction of 55-60%  Chemo Toxicities:  BRCA2 mutation: Oophorectomy will be delayed after chemotherapy.  Patient is very anxious because her husband has Parkinson's plus disease and requires a lot of assistance at home. With her current chemotherapy she is worried that she could get weaker and not be able to take care of her husband.  Return to clinic in 1 week for cycle 2.

## 2015-10-18 ENCOUNTER — Ambulatory Visit (HOSPITAL_BASED_OUTPATIENT_CLINIC_OR_DEPARTMENT_OTHER): Payer: Medicare Other | Admitting: Hematology and Oncology

## 2015-10-18 ENCOUNTER — Other Ambulatory Visit: Payer: Self-pay

## 2015-10-18 ENCOUNTER — Other Ambulatory Visit (HOSPITAL_BASED_OUTPATIENT_CLINIC_OR_DEPARTMENT_OTHER): Payer: Medicare Other

## 2015-10-18 ENCOUNTER — Ambulatory Visit: Payer: Medicare Other

## 2015-10-18 ENCOUNTER — Encounter: Payer: Self-pay | Admitting: Hematology and Oncology

## 2015-10-18 VITALS — BP 152/80 | HR 70 | Temp 97.9°F | Resp 18 | Wt 168.2 lb

## 2015-10-18 DIAGNOSIS — C50412 Malignant neoplasm of upper-outer quadrant of left female breast: Secondary | ICD-10-CM

## 2015-10-18 DIAGNOSIS — Z1509 Genetic susceptibility to other malignant neoplasm: Secondary | ICD-10-CM

## 2015-10-18 DIAGNOSIS — R5383 Other fatigue: Secondary | ICD-10-CM | POA: Diagnosis not present

## 2015-10-18 DIAGNOSIS — Z1501 Genetic susceptibility to malignant neoplasm of breast: Secondary | ICD-10-CM

## 2015-10-18 LAB — CBC WITH DIFFERENTIAL/PLATELET
BASO%: 0.8 % (ref 0.0–2.0)
Basophils Absolute: 0 10*3/uL (ref 0.0–0.1)
EOS ABS: 0.2 10*3/uL (ref 0.0–0.5)
EOS%: 4 % (ref 0.0–7.0)
HCT: 38.6 % (ref 34.8–46.6)
HEMOGLOBIN: 12.8 g/dL (ref 11.6–15.9)
LYMPH%: 15.9 % (ref 14.0–49.7)
MCH: 30.2 pg (ref 25.1–34.0)
MCHC: 33.1 g/dL (ref 31.5–36.0)
MCV: 91.2 fL (ref 79.5–101.0)
MONO#: 0 10*3/uL — ABNORMAL LOW (ref 0.1–0.9)
MONO%: 1.1 % (ref 0.0–14.0)
NEUT%: 78.2 % — ABNORMAL HIGH (ref 38.4–76.8)
NEUTROS ABS: 2.9 10*3/uL (ref 1.5–6.5)
Platelets: 182 10*3/uL (ref 145–400)
RBC: 4.23 10*6/uL (ref 3.70–5.45)
RDW: 13.1 % (ref 11.2–14.5)
WBC: 3.7 10*3/uL — AB (ref 3.9–10.3)
lymph#: 0.6 10*3/uL — ABNORMAL LOW (ref 0.9–3.3)

## 2015-10-18 LAB — COMPREHENSIVE METABOLIC PANEL
ALK PHOS: 93 U/L (ref 40–150)
ALT: 20 U/L (ref 0–55)
AST: 14 U/L (ref 5–34)
Albumin: 3.5 g/dL (ref 3.5–5.0)
Anion Gap: 9 mEq/L (ref 3–11)
BILIRUBIN TOTAL: 0.67 mg/dL (ref 0.20–1.20)
BUN: 16 mg/dL (ref 7.0–26.0)
CO2: 28 meq/L (ref 22–29)
Calcium: 9.2 mg/dL (ref 8.4–10.4)
Chloride: 98 mEq/L (ref 98–109)
Creatinine: 0.8 mg/dL (ref 0.6–1.1)
EGFR: 81 mL/min/{1.73_m2} — AB (ref >90)
Glucose: 81 mg/dl (ref 70–140)
POTASSIUM: 4.3 meq/L (ref 3.5–5.1)
SODIUM: 134 meq/L — AB (ref 136–145)
TOTAL PROTEIN: 6.9 g/dL (ref 6.4–8.3)

## 2015-10-18 NOTE — Progress Notes (Signed)
Referral made to Abby Potash in Social Work.

## 2015-10-18 NOTE — Progress Notes (Signed)
Patient Care Team: Unk Pinto, MD as PCP - General (Internal Medicine) Ralene Bathe, MD as Consulting Physician (Ophthalmology) Serafina Mitchell, MD as Consulting Physician (Vascular Surgery) Carol Ada, MD as Consulting Physician (Gastroenterology) Amy Martinique, MD as Consulting Physician (Dermatology) Nicholas Lose, MD as Consulting Physician (Hematology and Oncology) Excell Seltzer, MD as Consulting Physician (General Surgery)  SUMMARY OF ONCOLOGIC HISTORY:   Breast cancer of upper-outer quadrant of left female breast ([place])   08/03/2015 Mammogram Left Breast: 6 mm mass @ 1 o clock   08/09/2015 Initial Diagnosis Left breast 1:00 position: Invasive ductal carcinoma grade 1-2, ER 100%, PR 10%, HER-2 negative ratio 1.79, Ki-67 15%, T1 BN 0 stage I a clinical stage   08/29/2015 Surgery Left lumpectomy: invasive ductal carcinoma 1.2 cm with LVID, with DCIS intermediate grade, 1 sentinel node positive, ER 100%, PR 10%, HER-2 negative ratio 1.79, Ki-67 15% , margins negative, Mammaprint high risk, luminal B, T1cN1 stage II a   10/11/2015 -  Chemotherapy Adjuvant chemotherapy with dose dense Adriamycin and Cytoxan 4 followed by Abraxane weekly 12    CHIEF COMPLIANT: Cycle 1 day 8 dose dense Adriamycin and Cytoxan  INTERVAL HISTORY: Jacqueline Jenkins is a 65 year old with above-mentioned history of left breast cancer currently on adjuvant chemotherapy. She is here for toxicity check after cycle 1 chemotherapy. She had done reasonably well after the chemotherapy. She developed profound fatigue to the point that she was unable to do most of her chores. She was also having difficulty with sleeping at night probably because of Decadron. She also had acid reflux symptoms. She took Prilosec which helped her. She did not have any nausea or vomiting. She had constipation for 102 days after chemotherapy which resolved with stool softeners.  REVIEW OF SYSTEMS:   Constitutional: Denies fevers, chills or  abnormal weight loss Eyes: Denies blurriness of vision Ears, nose, mouth, throat, and face: Denies mucositis or sore throat Respiratory: Denies cough, dyspnea or wheezes Cardiovascular: Denies palpitation, chest discomfort Gastrointestinal: Acid reflux and constipation Skin: Denies abnormal skin rashes Lymphatics: Denies new lymphadenopathy or easy bruising Neurological:Denies numbness, tingling or new weaknesses Behavioral/Psych: Mood is stable, no new changes  Extremities: No lower extremity edema  All other systems were reviewed with the patient and are negative.  I have reviewed the past medical history, past surgical history, social history and family history with the patient and they are unchanged from previous note.  ALLERGIES:  is allergic to lescol; lipitor; and vytorin.  MEDICATIONS:  Current Outpatient Prescriptions  Medication Sig Dispense Refill  . aspirin 81 MG tablet Take 81 mg by mouth daily.    . benazepril-hydrochlorthiazide (LOTENSIN HCT) 20-12.5 MG tablet TAKE 1 TABLET BY MOUTH DAILY. 90 tablet 1  . Calcium Carbonate-Vitamin D (CALCIUM + D PO) Take 1 tablet by mouth 2 (two) times daily.    Marland Kitchen dexamethasone (DECADRON) 4 MG tablet Take 1 tablet (4 mg total) by mouth 2 (two) times daily. Take 2 tablets by mouth once a day on the day after chemotherapy and then take 2 tablets two times a day for 2 days. Take with food. 30 tablet 1  . fluticasone (FLONASE) 50 MCG/ACT nasal spray Place 1 spray into both nostrils as needed for allergies or rhinitis.    Marland Kitchen HYDROcodone-acetaminophen (NORCO/VICODIN) 5-325 MG tablet Take 1-2 tablets by mouth every 4 (four) hours as needed for moderate pain or severe pain. 20 tablet 0  . lidocaine-prilocaine (EMLA) cream Apply to affected area once 30 g 3  .  loratadine (CLARITIN) 10 MG tablet Take 10 mg by mouth daily.    Marland Kitchen LORazepam (ATIVAN) 0.5 MG tablet Take 1 tablet (0.5 mg total) by mouth at bedtime. 30 tablet 0  . Multiple Vitamin  (MULTIVITAMIN) tablet Take 1 tablet by mouth at bedtime.     . Omega-3 Fatty Acids (FISH OIL) 1200 MG CAPS Take 1 capsule by mouth daily.     Marland Kitchen omeprazole (PRILOSEC) 10 MG capsule Take 10 mg by mouth daily.    . ondansetron (ZOFRAN) 8 MG tablet Take 1 tablet (8 mg total) by mouth 2 (two) times daily as needed. Start on the third day after chemotherapy. 30 tablet 1  . prochlorperazine (COMPAZINE) 10 MG tablet Take 1 tablet (10 mg total) by mouth every 6 (six) hours as needed (Nausea or vomiting). 30 tablet 1   No current facility-administered medications for this visit.    PHYSICAL EXAMINATION: ECOG PERFORMANCE STATUS: 1 - Symptomatic but completely ambulatory  Filed Vitals:   10/18/15 0833  BP: 152/80  Pulse: 70  Temp: 97.9 F (36.6 C)  Resp: 18   Filed Weights   10/18/15 0833  Weight: 168 lb 3.2 oz (76.295 kg)    GENERAL:alert, no distress and comfortable SKIN: skin color, texture, turgor are normal, no rashes or significant lesions EYES: normal, Conjunctiva are pink and non-injected, sclera clear OROPHARYNX:no exudate, no erythema and lips, buccal mucosa, and tongue normal  NECK: supple, thyroid normal size, non-tender, without nodularity LYMPH:  no palpable lymphadenopathy in the cervical, axillary or inguinal LUNGS: clear to auscultation and percussion with normal breathing effort HEART: regular rate & rhythm and no murmurs and no lower extremity edema ABDOMEN:abdomen soft, non-tender and normal bowel sounds MUSCULOSKELETAL:no cyanosis of digits and no clubbing  NEURO: alert & oriented x 3 with fluent speech, no focal motor/sensory deficits EXTREMITIES: No lower extremity edema  LABORATORY DATA:  I have reviewed the data as listed   Chemistry      Component Value Date/Time   NA 134* 10/18/2015 0821   NA 137 10/08/2015 1430   K 4.3 10/18/2015 0821   K 3.9 10/08/2015 1430   CL 101 10/08/2015 1430   CO2 28 10/18/2015 0821   CO2 27 10/08/2015 1430   BUN 16.0  10/18/2015 0821   BUN 16 10/08/2015 1430   CREATININE 0.8 10/18/2015 0821   CREATININE 0.64 10/08/2015 1430   CREATININE 0.62 06/07/2015 1602      Component Value Date/Time   CALCIUM 9.2 10/18/2015 0821   CALCIUM 9.3 10/08/2015 1430   ALKPHOS 93 10/18/2015 0821   ALKPHOS 58 06/07/2015 1602   AST 14 10/18/2015 0821   AST 22 06/07/2015 1602   ALT 20 10/18/2015 0821   ALT 21 06/07/2015 1602   BILITOT 0.67 10/18/2015 0821   BILITOT 0.6 06/07/2015 1602       Lab Results  Component Value Date   WBC 3.7* 10/18/2015   HGB 12.8 10/18/2015   HCT 38.6 10/18/2015   MCV 91.2 10/18/2015   PLT 182 10/18/2015   NEUTROABS 2.9 10/18/2015   ASSESSMENT & PLAN:  Breast cancer of upper-outer quadrant of left female breast ([place]) Left lumpectomy 08/29/2015: Invasive ductal carcinoma 1.2 cm with LVID, with DCIS intermediate grade, 1 sentinel node positive, ER 100%, PR 10%, HER-2 negative ratio 1.79, Ki-67 15% , margins negative, Mammaprint high risk, luminal B, T1cN1 stage II a. Patient does not need axillary lymph node dissection based on current NCCN guidelines and ACOSOG Z 11 clinical trial; luminal  type B and high-risk phenotype. Risk of relapse without chemotherapy 24% versus 12% with chemotherapy.  Treatment plan: 1. Dose dense Adriamycin and Cytoxan 4 followed by Abraxane weekly 12 2. Adjuvant radiation therapy followed by 3. Adjuvant antiestrogen therapy ----------------------------------------------------------------------------------------------------------------- Current treatment: Cycle 1 day 8 dose dense Adriamycin and Cytoxan Echocardiogram showed an ejection fraction of 55-60%  Chemo Toxicities: 1. Severe fatigue day 4- day 8 BRCA2 mutation: Oophorectomy will be delayed after chemotherapy. Because she did quite well without nausea, I instructed her to discontinue Dexamethasone.  Patient is very anxious because her husband has Parkinson's plus disease and requires a lot of  assistance at home. With her current chemotherapy she is worried that she could get weaker and not be able to take care of her husband.  Return to clinic in 1 week for cycle 2.   No orders of the defined types were placed in this encounter.   The patient has a good understanding of the overall plan. she agrees with it. she will call with any problems that may develop before the next visit here.   Rulon Eisenmenger, MD 10/18/2015

## 2015-10-24 NOTE — Assessment & Plan Note (Signed)
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 Patient does not need axillary lymph node dissection based on current NCCN guidelines and ACOSOG Z 11 clinical trial; luminal type B and high-risk phenotype. Risk of relapse without chemotherapy 24% versus 12% with chemotherapy.  Treatment plan: 1. Dose dense Adriamycin and Cytoxan 4 followed by Abraxane weekly 12 2. Adjuvant radiation therapy followed by 3. Adjuvant antiestrogen therapy ----------------------------------------------------------------------------------------------------------------- Current treatment: Cycle 2 day 1 dose dense Adriamycin and Cytoxan Echocardiogram showed an ejection fraction of 55-60%  Chemo Toxicities: 1. Severe fatigue day 4- day 8 BRCA2 mutation: Oophorectomy will be delayed after chemotherapy. Because she did quite well without nausea, I instructed her to discontinue Dexamethasone.  Patient is very anxious because her husband has Parkinson's plus disease and requires a lot of assistance at home. With her current chemotherapy she is worried that she could get weaker and not be able to take care of her husband.  Return to clinic in 2 weeks for cycle 3.

## 2015-10-25 ENCOUNTER — Encounter: Payer: Self-pay | Admitting: Hematology and Oncology

## 2015-10-25 ENCOUNTER — Ambulatory Visit (HOSPITAL_BASED_OUTPATIENT_CLINIC_OR_DEPARTMENT_OTHER): Payer: Medicare Other | Admitting: Hematology and Oncology

## 2015-10-25 ENCOUNTER — Other Ambulatory Visit (HOSPITAL_BASED_OUTPATIENT_CLINIC_OR_DEPARTMENT_OTHER): Payer: Medicare Other

## 2015-10-25 ENCOUNTER — Ambulatory Visit (HOSPITAL_BASED_OUTPATIENT_CLINIC_OR_DEPARTMENT_OTHER): Payer: Medicare Other

## 2015-10-25 VITALS — BP 148/72 | HR 84 | Temp 97.8°F | Resp 18 | Wt 167.4 lb

## 2015-10-25 DIAGNOSIS — Z5111 Encounter for antineoplastic chemotherapy: Secondary | ICD-10-CM

## 2015-10-25 DIAGNOSIS — R5383 Other fatigue: Secondary | ICD-10-CM | POA: Diagnosis not present

## 2015-10-25 DIAGNOSIS — C50412 Malignant neoplasm of upper-outer quadrant of left female breast: Secondary | ICD-10-CM

## 2015-10-25 LAB — CBC WITH DIFFERENTIAL/PLATELET
BASO%: 4.1 % — ABNORMAL HIGH (ref 0.0–2.0)
BASOS ABS: 0.1 10*3/uL (ref 0.0–0.1)
EOS ABS: 0 10*3/uL (ref 0.0–0.5)
EOS%: 1.6 % (ref 0.0–7.0)
HEMATOCRIT: 38.8 % (ref 34.8–46.6)
HEMOGLOBIN: 12.8 g/dL (ref 11.6–15.9)
LYMPH#: 0.7 10*3/uL — AB (ref 0.9–3.3)
LYMPH%: 23.6 % (ref 14.0–49.7)
MCH: 30.2 pg (ref 25.1–34.0)
MCHC: 33.1 g/dL (ref 31.5–36.0)
MCV: 91.3 fL (ref 79.5–101.0)
MONO#: 0.3 10*3/uL (ref 0.1–0.9)
MONO%: 10.6 % (ref 0.0–14.0)
NEUT#: 1.8 10*3/uL (ref 1.5–6.5)
NEUT%: 60.1 % (ref 38.4–76.8)
PLATELETS: 215 10*3/uL (ref 145–400)
RBC: 4.25 10*6/uL (ref 3.70–5.45)
RDW: 13.1 % (ref 11.2–14.5)
WBC: 3 10*3/uL — ABNORMAL LOW (ref 3.9–10.3)

## 2015-10-25 LAB — COMPREHENSIVE METABOLIC PANEL
ALBUMIN: 3.7 g/dL (ref 3.5–5.0)
ALT: 22 U/L (ref 0–55)
ANION GAP: 9 meq/L (ref 3–11)
AST: 18 U/L (ref 5–34)
Alkaline Phosphatase: 78 U/L (ref 40–150)
BUN: 10.6 mg/dL (ref 7.0–26.0)
CALCIUM: 9.4 mg/dL (ref 8.4–10.4)
CHLORIDE: 100 meq/L (ref 98–109)
CO2: 25 meq/L (ref 22–29)
Creatinine: 0.8 mg/dL (ref 0.6–1.1)
EGFR: 81 mL/min/{1.73_m2} — ABNORMAL LOW (ref >90)
Glucose: 100 mg/dl (ref 70–140)
Potassium: 4.2 mEq/L (ref 3.5–5.1)
Sodium: 134 mEq/L — ABNORMAL LOW (ref 136–145)
Total Bilirubin: <0.3 mg/dL (ref 0.20–1.20)
Total Protein: 7.2 g/dL (ref 6.4–8.3)

## 2015-10-25 MED ORDER — HEPARIN SOD (PORK) LOCK FLUSH 100 UNIT/ML IV SOLN
500.0000 [IU] | Freq: Once | INTRAVENOUS | Status: DC | PRN
  Filled 2015-10-25: qty 5

## 2015-10-25 MED ORDER — SODIUM CHLORIDE 0.9% FLUSH
10.0000 mL | INTRAVENOUS | Status: DC | PRN
  Administered 2015-10-25: 10 mL
  Filled 2015-10-25: qty 10

## 2015-10-25 MED ORDER — DOXORUBICIN HCL CHEMO IV INJECTION 2 MG/ML
50.0000 mg/m2 | Freq: Once | INTRAVENOUS | Status: AC
  Administered 2015-10-25: 94 mg via INTRAVENOUS
  Filled 2015-10-25: qty 47

## 2015-10-25 MED ORDER — PALONOSETRON HCL INJECTION 0.25 MG/5ML
0.2500 mg | Freq: Once | INTRAVENOUS | Status: AC
  Administered 2015-10-25: 0.25 mg via INTRAVENOUS

## 2015-10-25 MED ORDER — SODIUM CHLORIDE 0.9 % IV SOLN
Freq: Once | INTRAVENOUS | Status: AC
  Administered 2015-10-25: 12:00:00 via INTRAVENOUS
  Filled 2015-10-25: qty 5

## 2015-10-25 MED ORDER — SODIUM CHLORIDE 0.9 % IV SOLN
Freq: Once | INTRAVENOUS | Status: AC
  Administered 2015-10-25: 11:00:00 via INTRAVENOUS

## 2015-10-25 MED ORDER — SODIUM CHLORIDE 0.9 % IV SOLN
500.0000 mg/m2 | Freq: Once | INTRAVENOUS | Status: AC
  Administered 2015-10-25: 940 mg via INTRAVENOUS
  Filled 2015-10-25: qty 47

## 2015-10-25 MED ORDER — PEGFILGRASTIM 6 MG/0.6ML ~~LOC~~ PSKT
6.0000 mg | PREFILLED_SYRINGE | Freq: Once | SUBCUTANEOUS | Status: AC
  Administered 2015-10-25: 6 mg via SUBCUTANEOUS
  Filled 2015-10-25: qty 0.6

## 2015-10-25 MED ORDER — PALONOSETRON HCL INJECTION 0.25 MG/5ML
INTRAVENOUS | Status: AC
  Filled 2015-10-25: qty 5

## 2015-10-25 NOTE — Progress Notes (Signed)
Unable to get in to exam room prior to MD.  No assessment performed.  

## 2015-10-25 NOTE — Progress Notes (Signed)
Patient Care Team: Unk Pinto, MD as PCP - General (Internal Medicine) Ralene Bathe, MD as Consulting Physician (Ophthalmology) Serafina Mitchell, MD as Consulting Physician (Vascular Surgery) Carol Ada, MD as Consulting Physician (Gastroenterology) Amy Martinique, MD as Consulting Physician (Dermatology) Nicholas Lose, MD as Consulting Physician (Hematology and Oncology) Excell Seltzer, MD as Consulting Physician (General Surgery)  SUMMARY OF ONCOLOGIC HISTORY:   Breast cancer of upper-outer quadrant of left female breast ([place])   08/03/2015 Mammogram Left Breast: 6 mm mass @ 1 o clock   08/09/2015 Initial Diagnosis Left breast 1:00 position: Invasive ductal carcinoma grade 1-2, ER 100%, PR 10%, HER-2 negative ratio 1.79, Ki-67 15%, T1 BN 0 stage I a clinical stage   08/29/2015 Surgery Left lumpectomy: invasive ductal carcinoma 1.2 cm with LVID, with DCIS intermediate grade, 1 sentinel node positive, ER 100%, PR 10%, HER-2 negative ratio 1.79, Ki-67 15% , margins negative, Mammaprint high risk, luminal B, T1cN1 stage II a   10/11/2015 -  Chemotherapy Adjuvant chemotherapy with dose dense Adriamycin and Cytoxan 4 followed by Abraxane weekly 12   CHIEF COMPLIANT: Cycle 2 day 1 dose dense Adriamycin and Cytoxan  INTERVAL HISTORY: APPLE DEARMAS is a 65 year old with above-mentioned history of left breast cancer who is here receiving adjuvant chemotherapy with dose dense Adriamycin and Cytoxan. Today is cycle 2 day 1. She reported that over the past week her fatigue had improved significantly. She has noticed a rash on her left lower extremity which is faint and macular in nature. It is not associated with any itching or discomfort. She has also noticed a few canker sores on the side of the tongue and her right side of the cheek.  REVIEW OF SYSTEMS:   Constitutional: Denies fevers, chills or abnormal weight loss Eyes: Denies blurriness of vision Ears, nose, mouth, throat, and face: Small  canker sores that are healing Respiratory: Denies cough, dyspnea or wheezes Cardiovascular: Denies palpitation, chest discomfort Gastrointestinal:  Denies nausea, heartburn or change in bowel habits Skin: Denies abnormal skin rashes Lymphatics: Denies new lymphadenopathy or easy bruising Neurological:Denies numbness, tingling or new weaknesses Behavioral/Psych: Mood is stable, no new changes  Extremities: No lower extremity edema Breast:  denies any pain or lumps or nodules in either breasts All other systems were reviewed with the patient and are negative.  I have reviewed the past medical history, past surgical history, social history and family history with the patient and they are unchanged from previous note.  ALLERGIES:  is allergic to lescol; lipitor; and vytorin.  MEDICATIONS:  Current Outpatient Prescriptions  Medication Sig Dispense Refill  . aspirin 81 MG tablet Take 81 mg by mouth daily.    . benazepril-hydrochlorthiazide (LOTENSIN HCT) 20-12.5 MG tablet TAKE 1 TABLET BY MOUTH DAILY. 90 tablet 1  . Calcium Carbonate-Vitamin D (CALCIUM + D PO) Take 1 tablet by mouth 2 (two) times daily.    Marland Kitchen dexamethasone (DECADRON) 4 MG tablet Take 1 tablet (4 mg total) by mouth 2 (two) times daily. Take 2 tablets by mouth once a day on the day after chemotherapy and then take 2 tablets two times a day for 2 days. Take with food. 30 tablet 1  . fluticasone (FLONASE) 50 MCG/ACT nasal spray Place 1 spray into both nostrils as needed for allergies or rhinitis.    Marland Kitchen HYDROcodone-acetaminophen (NORCO/VICODIN) 5-325 MG tablet Take 1-2 tablets by mouth every 4 (four) hours as needed for moderate pain or severe pain. 20 tablet 0  . lidocaine-prilocaine (EMLA) cream  Apply to affected area once 30 g 3  . loratadine (CLARITIN) 10 MG tablet Take 10 mg by mouth daily.    Marland Kitchen LORazepam (ATIVAN) 0.5 MG tablet Take 1 tablet (0.5 mg total) by mouth at bedtime. 30 tablet 0  . Multiple Vitamin (MULTIVITAMIN) tablet  Take 1 tablet by mouth at bedtime.     . Omega-3 Fatty Acids (FISH OIL) 1200 MG CAPS Take 1 capsule by mouth daily.     Marland Kitchen omeprazole (PRILOSEC) 10 MG capsule Take 10 mg by mouth daily.    . ondansetron (ZOFRAN) 8 MG tablet Take 1 tablet (8 mg total) by mouth 2 (two) times daily as needed. Start on the third day after chemotherapy. 30 tablet 1  . prochlorperazine (COMPAZINE) 10 MG tablet Take 1 tablet (10 mg total) by mouth every 6 (six) hours as needed (Nausea or vomiting). 30 tablet 1   No current facility-administered medications for this visit.    PHYSICAL EXAMINATION: ECOG PERFORMANCE STATUS: 1 - Symptomatic but completely ambulatory  Filed Vitals:   10/25/15 0944  BP: 148/72  Pulse: 84  Temp: 97.8 F (36.6 C)  Resp: 18   Filed Weights   10/25/15 0944  Weight: 167 lb 6.4 oz (75.932 kg)    GENERAL:alert, no distress and comfortable SKIN: skin color, texture, turgor are normal, no rashes or significant lesions EYES: normal, Conjunctiva are pink and non-injected, sclera clear OROPHARYNX:no exudate, no erythema and lips, buccal mucosa, and tongue normal  NECK: supple, thyroid normal size, non-tender, without nodularity LYMPH:  no palpable lymphadenopathy in the cervical, axillary or inguinal LUNGS: clear to auscultation and percussion with normal breathing effort HEART: regular rate & rhythm and no murmurs and no lower extremity edema ABDOMEN:abdomen soft, non-tender and normal bowel sounds MUSCULOSKELETAL:no cyanosis of digits and no clubbing  NEURO: alert & oriented x 3 with fluent speech, no focal motor/sensory deficits EXTREMITIES: No lower extremity edema  LABORATORY DATA:  I have reviewed the data as listed   Chemistry      Component Value Date/Time   NA 134* 10/25/2015 0931   NA 137 10/08/2015 1430   K 4.2 10/25/2015 0931   K 3.9 10/08/2015 1430   CL 101 10/08/2015 1430   CO2 25 10/25/2015 0931   CO2 27 10/08/2015 1430   BUN 10.6 10/25/2015 0931   BUN 16  10/08/2015 1430   CREATININE 0.8 10/25/2015 0931   CREATININE 0.64 10/08/2015 1430   CREATININE 0.62 06/07/2015 1602      Component Value Date/Time   CALCIUM 9.4 10/25/2015 0931   CALCIUM 9.3 10/08/2015 1430   ALKPHOS 78 10/25/2015 0931   ALKPHOS 58 06/07/2015 1602   AST 18 10/25/2015 0931   AST 22 06/07/2015 1602   ALT 22 10/25/2015 0931   ALT 21 06/07/2015 1602   BILITOT <0.30 10/25/2015 0931   BILITOT 0.6 06/07/2015 1602      Lab Results  Component Value Date   WBC 3.0* 10/25/2015   HGB 12.8 10/25/2015   HCT 38.8 10/25/2015   MCV 91.3 10/25/2015   PLT 215 10/25/2015   NEUTROABS 1.8 10/25/2015   ASSESSMENT & PLAN:  Breast cancer of upper-outer quadrant of left female breast ([place]) Left lumpectomy 08/29/2015: Invasive ductal carcinoma 1.2 cm with LVID, with DCIS intermediate grade, 1 sentinel node positive, ER 100%, PR 10%, HER-2 negative ratio 1.79, Ki-67 15% , margins negative, Mammaprint high risk, luminal B, T1cN1 stage II a. Patient does not need axillary lymph node dissection based on current  NCCN guidelines and ACOSOG Z 11 clinical trial; luminal type B and high-risk phenotype. Risk of relapse without chemotherapy 24% versus 12% with chemotherapy.  Treatment plan: 1. Dose dense Adriamycin and Cytoxan 4 followed by Abraxane weekly 12 2. Adjuvant radiation therapy followed by 3. Adjuvant antiestrogen therapy ----------------------------------------------------------------------------------------------------------------- Current treatment: Cycle 2 day 1 dose dense Adriamycin and Cytoxan Echocardiogram showed an ejection fraction of 55-60%  Chemo Toxicities: 1. Severe fatigue day 4- day 8 2. Leukopenia: I would decrease the dosage of cycle 2 of chemotherapy 3. Rash on the leg 4. Canker sores on the right side of the cheek and tongue: Encouraged her to drink more water and use salt water gargles. She is using biotin.  BRCA2 mutation: Oophorectomy will be delayed  after chemotherapy. I provided her with a voucher for a wig.  Patient is very anxious because her husband has Parkinson's plus disease and requires a lot of assistance at home. With her current chemotherapy she is worried that she could get weaker and not be able to take care of her husband.  Return to clinic in 2 weeks for cycle 3.  No orders of the defined types were placed in this encounter.   The patient has a good understanding of the overall plan. she agrees with it. she will call with any problems that may develop before the next visit here.   Rulon Eisenmenger, MD 10/25/2015

## 2015-10-25 NOTE — Patient Instructions (Signed)
Cyclophosphamide injection What is this medicine? CYCLOPHOSPHAMIDE (sye kloe FOSS fa mide) is a chemotherapy drug. It slows the growth of cancer cells. This medicine is used to treat many types of cancer like lymphoma, myeloma, leukemia, breast cancer, and ovarian cancer, to name a few. This medicine may be used for other purposes; ask your health care provider or pharmacist if you have questions. What should I tell my health care provider before I take this medicine? They need to know if you have any of these conditions: -blood disorders -history of other chemotherapy -infection -kidney disease -liver disease -recent or ongoing radiation therapy -tumors in the bone marrow -an unusual or allergic reaction to cyclophosphamide, other chemotherapy, other medicines, foods, dyes, or preservatives -pregnant or trying to get pregnant -breast-feeding How should I use this medicine? This drug is usually given as an injection into a vein or muscle or by infusion into a vein. It is administered in a hospital or clinic by a specially trained health care professional. Talk to your pediatrician regarding the use of this medicine in children. Special care may be needed. Overdosage: If you think you have taken too much of this medicine contact a poison control center or emergency room at once. NOTE: This medicine is only for you. Do not share this medicine with others. What if I miss a dose? It is important not to miss your dose. Call your doctor or health care professional if you are unable to keep an appointment. What may interact with this medicine? This medicine may interact with the following medications: -amiodarone -amphotericin B -azathioprine -certain antiviral medicines for HIV or AIDS such as protease inhibitors (e.g., indinavir, ritonavir) and zidovudine -certain blood pressure medications such as benazepril, captopril, enalapril, fosinopril, lisinopril, moexipril, monopril, perindopril,  quinapril, ramipril, trandolapril -certain cancer medications such as anthracyclines (e.g., daunorubicin, doxorubicin), busulfan, cytarabine, paclitaxel, pentostatin, tamoxifen, trastuzumab -certain diuretics such as chlorothiazide, chlorthalidone, hydrochlorothiazide, indapamide, metolazone -certain medicines that treat or prevent blood clots like warfarin -certain muscle relaxants such as succinylcholine -cyclosporine -etanercept -indomethacin -medicines to increase blood counts like filgrastim, pegfilgrastim, sargramostim -medicines used as general anesthesia -metronidazole -natalizumab This list may not describe all possible interactions. Give your health care provider a list of all the medicines, herbs, non-prescription drugs, or dietary supplements you use. Also tell them if you smoke, drink alcohol, or use illegal drugs. Some items may interact with your medicine. What should I watch for while using this medicine? Visit your doctor for checks on your progress. This drug may make you feel generally unwell. This is not uncommon, as chemotherapy can affect healthy cells as well as cancer cells. Report any side effects. Continue your course of treatment even though you feel ill unless your doctor tells you to stop. Drink water or other fluids as directed. Urinate often, even at night. In some cases, you may be given additional medicines to help with side effects. Follow all directions for their use. Call your doctor or health care professional for advice if you get a fever, chills or sore throat, or other symptoms of a cold or flu. Do not treat yourself. This drug decreases your body's ability to fight infections. Try to avoid being around people who are sick. This medicine may increase your risk to bruise or bleed. Call your doctor or health care professional if you notice any unusual bleeding. Be careful brushing and flossing your teeth or using a toothpick because you may get an infection or  bleed more easily. If you have  medicine may increase your risk to bruise or bleed. Call your doctor or health care professional if you notice any unusual bleeding.  Be careful brushing and flossing your teeth or using a toothpick because you may get an infection or  bleed more easily. If you have any dental work done, tell your dentist you are receiving this medicine.  You may get drowsy or dizzy. Do not drive, use machinery, or do anything that needs mental alertness until you know how this medicine affects you.  Do not become pregnant while taking this medicine or for 1 year after stopping it. Women should inform their doctor if they wish to become pregnant or think they might be pregnant. Men should not father a child while taking this medicine and for 4 months after stopping it. There is a potential for serious side effects to an unborn child. Talk to your health care professional or pharmacist for more information. Do not breast-feed an infant while taking this medicine.  This medicine may interfere with the ability to have a child. This medicine has caused ovarian failure in some women. This medicine has caused reduced sperm counts in some men. You should talk with your doctor or health care professional if you are concerned about your fertility.  If you are going to have surgery, tell your doctor or health care professional that you have taken this medicine.  What side effects may I notice from receiving this medicine?  Side effects that you should report to your doctor or health care professional as soon as possible:  -allergic reactions like skin rash, itching or hives, swelling of the face, lips, or tongue  -low blood counts - this medicine may decrease the number of white blood cells, red blood cells and platelets. You may be at increased risk for infections and bleeding.  -signs of infection - fever or chills, cough, sore throat, pain or difficulty passing urine  -signs of decreased platelets or bleeding - bruising, pinpoint red spots on the skin, black, tarry stools, blood in the urine  -signs of decreased red blood cells - unusually weak or tired, fainting spells, lightheadedness  -breathing problems  -dark urine  -dizziness  -palpitations  -swelling of the  ankles, feet, hands  -trouble passing urine or change in the amount of urine  -weight gain  -yellowing of the eyes or skin  Side effects that usually do not require medical attention (report to your doctor or health care professional if they continue or are bothersome):  -changes in nail or skin color  -hair loss  -missed menstrual periods  -mouth sores  -nausea, vomiting  This list may not describe all possible side effects. Call your doctor for medical advice about side effects. You may report side effects to FDA at 1-800-FDA-1088.  Where should I keep my medicine?  This drug is given in a hospital or clinic and will not be stored at home.  NOTE: This sheet is a summary. It may not cover all possible information. If you have questions about this medicine, talk to your doctor, pharmacist, or health care provider.      2016, [employer]. (2012-07-02 16:22:58)  Doxorubicin injection  What is this medicine?  DOXORUBICIN (dox oh ROO bi sin) is a chemotherapy drug. It is used to treat many kinds of cancer like Hodgkin's disease, leukemia, non-Hodgkin's lymphoma, neuroblastoma, sarcoma, and Wilms' tumor. It is also used to treat bladder cancer, breast cancer, lung cancer, ovarian cancer, stomach cancer, and thyroid cancer.    This medicine may be used for other purposes; ask your health care provider or pharmacist if you have questions.  What should I tell my health care provider before I take this medicine?  They need to know if you have any of these conditions:  -blood disorders  -heart disease, recent heart attack  -infection (especially a virus infection such as chickenpox, cold sores, or herpes)  -irregular heartbeat  -liver disease  -recent or ongoing radiation therapy  -an unusual or allergic reaction to doxorubicin, other chemotherapy agents, other medicines, foods, dyes, or preservatives  -pregnant or trying to get pregnant  -breast-feeding  How should I use this medicine?  This drug is given as an  infusion into a vein. It is administered in a hospital or clinic by a specially trained health care professional. If you have pain, swelling, burning or any unusual feeling around the site of your injection, tell your health care professional right away.  Talk to your pediatrician regarding the use of this medicine in children. Special care may be needed.  Overdosage: If you think you have taken too much of this medicine contact a poison control center or emergency room at once.  NOTE: This medicine is only for you. Do not share this medicine with others.  What if I miss a dose?  It is important not to miss your dose. Call your doctor or health care professional if you are unable to keep an appointment.  What may interact with this medicine?  Do not take this medicine with any of the following medications:  -cisapride  -droperidol  -halofantrine  -pimozide  -zidovudine  This medicine may also interact with the following medications:  -chloroquine  -chlorpromazine  -clarithromycin  -cyclophosphamide  -cyclosporine  -erythromycin  -medicines for depression, anxiety, or psychotic disturbances  -medicines for irregular heart beat like amiodarone, bepridil, dofetilide, encainide, flecainide, propafenone, quinidine  -medicines for seizures like ethotoin, fosphenytoin, phenytoin  -medicines for nausea, vomiting like dolasetron, ondansetron, palonosetron  -medicines to increase blood counts like filgrastim, pegfilgrastim, sargramostim  -methadone  -methotrexate  -pentamidine  -progesterone  -vaccines  -verapamil  Talk to your doctor or health care professional before taking any of these medicines:  -acetaminophen  -aspirin  -ibuprofen  -ketoprofen  -naproxen  This list may not describe all possible interactions. Give your health care provider a list of all the medicines, herbs, non-prescription drugs, or dietary supplements you use. Also tell them if you smoke, drink alcohol, or use illegal drugs. Some items may interact  with your medicine.  What should I watch for while using this medicine?  Your condition will be monitored carefully while you are receiving this medicine. You will need important blood work done while you are taking this medicine.  This drug may make you feel generally unwell. This is not uncommon, as chemotherapy can affect healthy cells as well as cancer cells. Report any side effects. Continue your course of treatment even though you feel ill unless your doctor tells you to stop.  Your urine may turn red for a few days after your dose. This is not blood. If your urine is dark or brown, call your doctor.  In some cases, you may be given additional medicines to help with side effects. Follow all directions for their use.  Call your doctor or health care professional for advice if you get a fever, chills or sore throat, or other symptoms of a cold or flu. Do not treat yourself. This drug decreases your body's ability   to fight infections. Try to avoid being around people who are sick.  This medicine may increase your risk to bruise or bleed. Call your doctor or health care professional if you notice any unusual bleeding.  Be careful brushing and flossing your teeth or using a toothpick because you may get an infection or bleed more easily. If you have any dental work done, tell your dentist you are receiving this medicine.  Avoid taking products that contain aspirin, acetaminophen, ibuprofen, naproxen, or ketoprofen unless instructed by your doctor. These medicines may hide a fever.  Men and women of childbearing age should use effective birth control methods while using taking this medicine. Do not become pregnant while taking this medicine. There is a potential for serious side effects to an unborn child. Talk to your health care professional or pharmacist for more information. Do not breast-feed an infant while taking this medicine.  Do not let others touch your urine or other body fluids for 5 days after each  treatment with this medicine. Caregivers should wear latex gloves to avoid touching body fluids during this time.  There is a maximum amount of this medicine you should receive throughout your life. The amount depends on the medical condition being treated and your overall health. Your doctor will watch how much of this medicine you receive in your lifetime. Tell your doctor if you have taken this medicine before.  What side effects may I notice from receiving this medicine?  Side effects that you should report to your doctor or health care professional as soon as possible:  -allergic reactions like skin rash, itching or hives, swelling of the face, lips, or tongue  -low blood counts - this medicine may decrease the number of white blood cells, red blood cells and platelets. You may be at increased risk for infections and bleeding.  -signs of infection - fever or chills, cough, sore throat, pain or difficulty passing urine  -signs of decreased platelets or bleeding - bruising, pinpoint red spots on the skin, black, tarry stools, blood in the urine  -signs of decreased red blood cells - unusually weak or tired, fainting spells, lightheadedness  -breathing problems  -chest pain  -fast, irregular heartbeat  -mouth sores  -nausea, vomiting  -pain, swelling, redness at site where injected  -pain, tingling, numbness in the hands or feet  -swelling of ankles, feet, or hands  -unusual bleeding or bruising  Side effects that usually do not require medical attention (report to your doctor or health care professional if they continue or are bothersome):  -diarrhea  -facial flushing  -hair loss  -loss of appetite  -missed menstrual periods  -nail discoloration or damage  -red or watery eyes  -red colored urine  -stomach upset  This list may not describe all possible side effects. Call your doctor for medical advice about side effects. You may report side effects to FDA at 1-800-FDA-1088.  Where should I keep my medicine?  This  drug is given in a hospital or clinic and will not be stored at home.  NOTE: This sheet is a summary. It may not cover all possible information. If you have questions about this medicine, talk to your doctor, pharmacist, or health care provider.      2016, [employer]. (2012-12-14 09:54:34)

## 2015-11-01 ENCOUNTER — Other Ambulatory Visit: Payer: Self-pay | Admitting: *Deleted

## 2015-11-01 DIAGNOSIS — C50412 Malignant neoplasm of upper-outer quadrant of left female breast: Secondary | ICD-10-CM

## 2015-11-01 MED ORDER — MAGIC MOUTHWASH W/LIDOCAINE: ORAL | Status: DC

## 2015-11-01 NOTE — Telephone Encounter (Signed)
Received call from patient stating she has some mouth sores and wanting some magic mouthwash.  Denies any tongue coating. Instructed her to call us back if anything changes or this doesn't help.

## 2015-11-07 ENCOUNTER — Encounter: Payer: Self-pay | Admitting: *Deleted

## 2015-11-08 ENCOUNTER — Ambulatory Visit (HOSPITAL_BASED_OUTPATIENT_CLINIC_OR_DEPARTMENT_OTHER): Payer: Medicare Other | Admitting: Nurse Practitioner

## 2015-11-08 ENCOUNTER — Telehealth: Payer: Self-pay | Admitting: Hematology and Oncology

## 2015-11-08 ENCOUNTER — Other Ambulatory Visit (HOSPITAL_BASED_OUTPATIENT_CLINIC_OR_DEPARTMENT_OTHER): Payer: Medicare Other

## 2015-11-08 ENCOUNTER — Ambulatory Visit (HOSPITAL_BASED_OUTPATIENT_CLINIC_OR_DEPARTMENT_OTHER): Payer: Medicare Other

## 2015-11-08 ENCOUNTER — Encounter: Payer: Self-pay | Admitting: Nurse Practitioner

## 2015-11-08 VITALS — BP 135/72 | HR 97 | Temp 98.7°F | Resp 18 | Ht 65.0 in | Wt 165.2 lb

## 2015-11-08 DIAGNOSIS — Z5111 Encounter for antineoplastic chemotherapy: Secondary | ICD-10-CM | POA: Diagnosis not present

## 2015-11-08 DIAGNOSIS — C50412 Malignant neoplasm of upper-outer quadrant of left female breast: Secondary | ICD-10-CM

## 2015-11-08 DIAGNOSIS — R530 Neoplastic (malignant) related fatigue: Secondary | ICD-10-CM

## 2015-11-08 DIAGNOSIS — K1231 Oral mucositis (ulcerative) due to antineoplastic therapy: Secondary | ICD-10-CM

## 2015-11-08 LAB — COMPREHENSIVE METABOLIC PANEL
ALBUMIN: 3.7 g/dL (ref 3.5–5.0)
ALK PHOS: 111 U/L (ref 40–150)
ALT: 13 U/L (ref 0–55)
AST: 14 U/L (ref 5–34)
Anion Gap: 10 mEq/L (ref 3–11)
BUN: 12 mg/dL (ref 7.0–26.0)
CALCIUM: 9.6 mg/dL (ref 8.4–10.4)
CO2: 25 mEq/L (ref 22–29)
Chloride: 101 mEq/L (ref 98–109)
Creatinine: 0.8 mg/dL (ref 0.6–1.1)
EGFR: 72 mL/min/{1.73_m2} — AB (ref >90)
GLUCOSE: 115 mg/dL (ref 70–140)
POTASSIUM: 4 meq/L (ref 3.5–5.1)
Sodium: 136 mEq/L (ref 136–145)
Total Protein: 7.4 g/dL (ref 6.4–8.3)

## 2015-11-08 LAB — CBC WITH DIFFERENTIAL/PLATELET
BASO%: 1.3 % (ref 0.0–2.0)
BASOS ABS: 0.2 10*3/uL — AB (ref 0.0–0.1)
EOS ABS: 0 10*3/uL (ref 0.0–0.5)
EOS%: 0.1 % (ref 0.0–7.0)
HEMATOCRIT: 37.4 % (ref 34.8–46.6)
HEMOGLOBIN: 12.3 g/dL (ref 11.6–15.9)
LYMPH#: 0.6 10*3/uL — AB (ref 0.9–3.3)
LYMPH%: 4.7 % — ABNORMAL LOW (ref 14.0–49.7)
MCH: 29.8 pg (ref 25.1–34.0)
MCHC: 33 g/dL (ref 31.5–36.0)
MCV: 90.2 fL (ref 79.5–101.0)
MONO#: 0.7 10*3/uL (ref 0.1–0.9)
MONO%: 5.7 % (ref 0.0–14.0)
NEUT%: 88.2 % — ABNORMAL HIGH (ref 38.4–76.8)
NEUTROS ABS: 11 10*3/uL — AB (ref 1.5–6.5)
Platelets: 202 10*3/uL (ref 145–400)
RBC: 4.14 10*6/uL (ref 3.70–5.45)
RDW: 13.6 % (ref 11.2–14.5)
WBC: 12.5 10*3/uL — ABNORMAL HIGH (ref 3.9–10.3)

## 2015-11-08 MED ORDER — SODIUM CHLORIDE 0.9 % IV SOLN
Freq: Once | INTRAVENOUS | Status: AC
  Administered 2015-11-08: 12:00:00 via INTRAVENOUS

## 2015-11-08 MED ORDER — SODIUM CHLORIDE 0.9% FLUSH
10.0000 mL | INTRAVENOUS | Status: DC | PRN
  Administered 2015-11-08: 10 mL
  Filled 2015-11-08: qty 10

## 2015-11-08 MED ORDER — HEPARIN SOD (PORK) LOCK FLUSH 100 UNIT/ML IV SOLN
500.0000 [IU] | Freq: Once | INTRAVENOUS | Status: AC | PRN
  Administered 2015-11-08: 500 [IU]
  Filled 2015-11-08: qty 5

## 2015-11-08 MED ORDER — SODIUM CHLORIDE 0.9 % IV SOLN
500.0000 mg/m2 | Freq: Once | INTRAVENOUS | Status: AC
  Administered 2015-11-08: 940 mg via INTRAVENOUS
  Filled 2015-11-08: qty 47

## 2015-11-08 MED ORDER — PALONOSETRON HCL INJECTION 0.25 MG/5ML
INTRAVENOUS | Status: AC
  Filled 2015-11-08: qty 5

## 2015-11-08 MED ORDER — PEGFILGRASTIM 6 MG/0.6ML ~~LOC~~ PSKT
6.0000 mg | PREFILLED_SYRINGE | Freq: Once | SUBCUTANEOUS | Status: AC
  Administered 2015-11-08: 6 mg via SUBCUTANEOUS
  Filled 2015-11-08: qty 0.6

## 2015-11-08 MED ORDER — PALONOSETRON HCL INJECTION 0.25 MG/5ML
0.2500 mg | Freq: Once | INTRAVENOUS | Status: AC
Start: 2015-11-08 — End: 2015-11-08
  Administered 2015-11-08: 0.25 mg via INTRAVENOUS

## 2015-11-08 MED ORDER — DOXORUBICIN HCL CHEMO IV INJECTION 2 MG/ML
50.0000 mg/m2 | Freq: Once | INTRAVENOUS | Status: AC
  Administered 2015-11-08: 94 mg via INTRAVENOUS
  Filled 2015-11-08: qty 47

## 2015-11-08 MED ORDER — SODIUM CHLORIDE 0.9 % IV SOLN
Freq: Once | INTRAVENOUS | Status: AC
  Administered 2015-11-08: 12:00:00 via INTRAVENOUS
  Filled 2015-11-08: qty 5

## 2015-11-08 NOTE — Progress Notes (Signed)
Patient Care Team: Unk Pinto, MD as PCP - General (Internal Medicine) Ralene Bathe, MD as Consulting Physician (Ophthalmology) Serafina Mitchell, MD as Consulting Physician (Vascular Surgery) Carol Ada, MD as Consulting Physician (Gastroenterology) Amy Martinique, MD as Consulting Physician (Dermatology) Nicholas Lose, MD as Consulting Physician (Hematology and Oncology) Excell Seltzer, MD as Consulting Physician (General Surgery)  SUMMARY OF ONCOLOGIC HISTORY:   Breast cancer of upper-outer quadrant of left female breast ([place])   08/03/2015 Mammogram Left Breast: 6 mm mass @ 1 o clock   08/09/2015 Initial Diagnosis Left breast 1:00 position: Invasive ductal carcinoma grade 1-2, ER 100%, PR 10%, HER-2 negative ratio 1.79, Ki-67 15%, T1 BN 0 stage I a clinical stage   08/29/2015 Surgery Left lumpectomy: invasive ductal carcinoma 1.2 cm with LVID, with DCIS intermediate grade, 1 sentinel node positive, ER 100%, PR 10%, HER-2 negative ratio 1.79, Ki-67 15% , margins negative, Mammaprint high risk, luminal B, T1cN1 stage II a   10/11/2015 -  Chemotherapy Adjuvant chemotherapy with dose dense Adriamycin and Cytoxan 4 followed by Abraxane weekly 12   CHIEF COMPLIANT: Cycle 3 day 1 dose dense Adriamycin and Cytoxan  INTERVAL HISTORY: Jacqueline Jenkins is a 65 year old with above-mentioned history of left breast cancer who is here receiving adjuvant chemotherapy with dose dense Adriamycin and Cytoxan. Today is cycle 3 day 1. Her main complaint with this treatment is fatigue. She does sleep better when she took a break from the dexamethasone with the last cycle. She denies fevers, chills, nausea, vomiting, or changes in bowel or bladder habits. Her appetite is good. Her mouth sores are resolving with the use of magic mouth wash. She has one last lesion to her right mouth. The rash to her left leg has resolved.  REVIEW OF SYSTEMS:   Constitutional: Denies fevers, chills or abnormal weight  loss Eyes: Denies blurriness of vision Ears, nose, mouth, throat, and face: Small canker sores that are healing Respiratory: Denies cough, dyspnea or wheezes Cardiovascular: Denies palpitation, chest discomfort Gastrointestinal:  Denies nausea, heartburn or change in bowel habits Skin: Denies abnormal skin rashes Lymphatics: Denies new lymphadenopathy or easy bruising Neurological:Denies numbness, tingling or new weaknesses Behavioral/Psych: Mood is stable, no new changes  Extremities: No lower extremity edema Breast:  denies any pain or lumps or nodules in either breasts All other systems were reviewed with the patient and are negative.  I have reviewed the past medical history, past surgical history, social history and family history with the patient and they are unchanged from previous note.  ALLERGIES:  is allergic to lescol; lipitor; and vytorin.  MEDICATIONS:  Current Outpatient Prescriptions  Medication Sig Dispense Refill  . benazepril-hydrochlorthiazide (LOTENSIN HCT) 20-12.5 MG tablet TAKE 1 TABLET BY MOUTH DAILY. 90 tablet 1  . Calcium Carbonate-Vitamin D (CALCIUM + D PO) Take 1 tablet by mouth 2 (two) times daily.    Marland Kitchen dexamethasone (DECADRON) 4 MG tablet Take 1 tablet (4 mg total) by mouth 2 (two) times daily. Take 2 tablets by mouth once a day on the day after chemotherapy and then take 2 tablets two times a day for 2 days. Take with food. 30 tablet 1  . fluticasone (FLONASE) 50 MCG/ACT nasal spray Place 1 spray into both nostrils as needed for allergies or rhinitis.    Marland Kitchen lidocaine-prilocaine (EMLA) cream Apply to affected area once 30 g 3  . loratadine (CLARITIN) 10 MG tablet Take 10 mg by mouth daily.    . magic mouthwash w/lidocaine SOLN 5-58m  QID prn 240 mL 1  . Multiple Vitamin (MULTIVITAMIN) tablet Take 1 tablet by mouth at bedtime.     . Omega-3 Fatty Acids (FISH OIL) 1200 MG CAPS Take 1 capsule by mouth daily.     Marland Kitchen omeprazole (PRILOSEC) 10 MG capsule Take 10 mg  by mouth daily.    . ondansetron (ZOFRAN) 8 MG tablet Take 1 tablet (8 mg total) by mouth 2 (two) times daily as needed. Start on the third day after chemotherapy. 30 tablet 1  . aspirin 81 MG tablet Take 81 mg by mouth daily. Reported on 11/08/2015    . HYDROcodone-acetaminophen (NORCO/VICODIN) 5-325 MG tablet Take 1-2 tablets by mouth every 4 (four) hours as needed for moderate pain or severe pain. (Patient not taking: Reported on 11/08/2015) 20 tablet 0  . LORazepam (ATIVAN) 0.5 MG tablet Take 1 tablet (0.5 mg total) by mouth at bedtime. (Patient not taking: Reported on 11/08/2015) 30 tablet 0  . prochlorperazine (COMPAZINE) 10 MG tablet Take 1 tablet (10 mg total) by mouth every 6 (six) hours as needed (Nausea or vomiting). (Patient not taking: Reported on 11/08/2015) 30 tablet 1   No current facility-administered medications for this visit.    PHYSICAL EXAMINATION: ECOG PERFORMANCE STATUS: 1 - Symptomatic but completely ambulatory  Filed Vitals:   11/08/15 1012  BP: 135/72  Pulse: 97  Temp: 98.7 F (37.1 C)  Resp: 18   Filed Weights   11/08/15 1012  Weight: 165 lb 3.2 oz (74.934 kg)    GENERAL:alert, no distress and comfortable SKIN: skin color, texture, turgor are normal, no rashes or significant lesions EYES: normal, Conjunctiva are pink and non-injected, sclera clear OROPHARYNX:no exudate, no erythema and lips, buccal mucosa, and tongue normal  NECK: supple, thyroid normal size, non-tender, without nodularity LYMPH:  no palpable lymphadenopathy in the cervical, axillary or inguinal LUNGS: clear to auscultation and percussion with normal breathing effort HEART: regular rate & rhythm and no murmurs and no lower extremity edema ABDOMEN:abdomen soft, non-tender and normal bowel sounds MUSCULOSKELETAL:no cyanosis of digits and no clubbing  NEURO: alert & oriented x 3 with fluent speech, no focal motor/sensory deficits EXTREMITIES: No lower extremity edema  LABORATORY DATA:  I have  reviewed the data as listed   Chemistry      Component Value Date/Time   NA 136 11/08/2015 1003   NA 137 10/08/2015 1430   K 4.0 11/08/2015 1003   K 3.9 10/08/2015 1430   CL 101 10/08/2015 1430   CO2 25 11/08/2015 1003   CO2 27 10/08/2015 1430   BUN 12.0 11/08/2015 1003   BUN 16 10/08/2015 1430   CREATININE 0.8 11/08/2015 1003   CREATININE 0.64 10/08/2015 1430   CREATININE 0.62 06/07/2015 1602      Component Value Date/Time   CALCIUM 9.6 11/08/2015 1003   CALCIUM 9.3 10/08/2015 1430   ALKPHOS 111 11/08/2015 1003   ALKPHOS 58 06/07/2015 1602   AST 14 11/08/2015 1003   AST 22 06/07/2015 1602   ALT 13 11/08/2015 1003   ALT 21 06/07/2015 1602   BILITOT <0.30 11/08/2015 1003   BILITOT 0.6 06/07/2015 1602      Lab Results  Component Value Date   WBC 12.5* 11/08/2015   HGB 12.3 11/08/2015   HCT 37.4 11/08/2015   MCV 90.2 11/08/2015   PLT 202 11/08/2015   NEUTROABS 11.0* 11/08/2015   ASSESSMENT & PLAN:  Breast cancer of upper-outer quadrant of left female breast ([place]) Left lumpectomy 08/29/2015: Invasive ductal carcinoma 1.2  cm with LVID, with DCIS intermediate grade, 1 sentinel node positive, ER 100%, PR 10%, HER-2 negative ratio 1.79, Ki-67 15% , margins negative, Mammaprint high risk, luminal B, T1cN1 stage II a. Patient does not need axillary lymph node dissection based on current NCCN guidelines and ACOSOG Z 11 clinical trial; luminal type B and high-risk phenotype. Risk of relapse without chemotherapy 24% versus 12% with chemotherapy.  Treatment plan: 1. Dose dense Adriamycin and Cytoxan 4 followed by Abraxane weekly 12 2. Adjuvant radiation therapy followed by 3. Adjuvant antiestrogen therapy ----------------------------------------------------------------------------------------------------------------- Current treatment: Cycle 3 day 1 dose dense Adriamycin and Cytoxan Echocardiogram showed an ejection fraction of 55-60%  Chemo Toxicities: 1. Severe fatigue: day  4 - day 8 2. Leukopenia: Dosage reduced with cycle 2 of AC 3. Rash on the leg: resolved 4. Canker sores on the right side of the cheek and tongue: Improved. encouraged her to drink more water and use salt water gargles. She is using biotin and magic mouthwash.  BRCA2 mutation: Oophorectomy will be delayed after chemotherapy. I provided her with a voucher for a wig.  Patient is very anxious because her husband has Parkinson's plus disease and requires a lot of assistance at home. With her current chemotherapy she is worried that she could get weaker and not be able to take care of her husband.  Return to clinic in 2 weeks for cycle 4.  No orders of the defined types were placed in this encounter.   The patient has a good understanding of the overall plan. she agrees with it. she will call with any problems that may develop before the next visit here.   Laurie Panda, NP 11/08/2015

## 2015-11-08 NOTE — Telephone Encounter (Signed)
Gave patient dtr avs report and appointments for March.

## 2015-11-08 NOTE — Patient Instructions (Addendum)
[facility] Discharge Instructions for Patients Receiving Chemotherapy  Today you received the following chemotherapy agents :  Adriamycin,  Cytoxan,  Neulasta  onpro.  To help prevent nausea and vomiting after your treatment, we encourage you to take your nausea medication as prescribed.   If you develop nausea and vomiting that is not controlled by your nausea medication, call the clinic.   BELOW ARE SYMPTOMS THAT SHOULD BE REPORTED IMMEDIATELY:  *FEVER GREATER THAN 100.5 F  *CHILLS WITH OR WITHOUT FEVER  NAUSEA AND VOMITING THAT IS NOT CONTROLLED WITH YOUR NAUSEA MEDICATION  *UNUSUAL SHORTNESS OF BREATH  *UNUSUAL BRUISING OR BLEEDING  TENDERNESS IN MOUTH AND THROAT WITH OR WITHOUT PRESENCE OF ULCERS  *URINARY PROBLEMS  *BOWEL PROBLEMS  UNUSUAL RASH Items with * indicate a potential emergency and should be followed up as soon as possible.  Feel free to call the clinic you have any questions or concerns. The clinic phone number is [phone].  Please show the [facility] at check-in to the Emergency Department and triage nurse.

## 2015-11-21 ENCOUNTER — Encounter: Payer: Self-pay | Admitting: Hematology and Oncology

## 2015-11-21 ENCOUNTER — Ambulatory Visit (HOSPITAL_BASED_OUTPATIENT_CLINIC_OR_DEPARTMENT_OTHER): Payer: Medicare Other

## 2015-11-21 ENCOUNTER — Telehealth: Payer: Self-pay | Admitting: Hematology and Oncology

## 2015-11-21 ENCOUNTER — Other Ambulatory Visit (HOSPITAL_BASED_OUTPATIENT_CLINIC_OR_DEPARTMENT_OTHER): Payer: Medicare Other

## 2015-11-21 ENCOUNTER — Ambulatory Visit (HOSPITAL_BASED_OUTPATIENT_CLINIC_OR_DEPARTMENT_OTHER): Payer: Medicare Other | Admitting: Hematology and Oncology

## 2015-11-21 VITALS — BP 117/69 | HR 97 | Temp 98.2°F | Resp 18 | Wt 161.0 lb

## 2015-11-21 DIAGNOSIS — C50412 Malignant neoplasm of upper-outer quadrant of left female breast: Secondary | ICD-10-CM

## 2015-11-21 DIAGNOSIS — Z5111 Encounter for antineoplastic chemotherapy: Secondary | ICD-10-CM

## 2015-11-21 DIAGNOSIS — R530 Neoplastic (malignant) related fatigue: Secondary | ICD-10-CM | POA: Diagnosis not present

## 2015-11-21 LAB — COMPREHENSIVE METABOLIC PANEL
ALBUMIN: 3.8 g/dL (ref 3.5–5.0)
ALK PHOS: 125 U/L (ref 40–150)
ALT: 11 U/L (ref 0–55)
AST: 15 U/L (ref 5–34)
Anion Gap: 10 mEq/L (ref 3–11)
BILIRUBIN TOTAL: 0.3 mg/dL (ref 0.20–1.20)
BUN: 12.7 mg/dL (ref 7.0–26.0)
CO2: 26 meq/L (ref 22–29)
CREATININE: 0.9 mg/dL (ref 0.6–1.1)
Calcium: 9.7 mg/dL (ref 8.4–10.4)
Chloride: 102 mEq/L (ref 98–109)
EGFR: 65 mL/min/{1.73_m2} — AB (ref >90)
GLUCOSE: 124 mg/dL (ref 70–140)
Potassium: 3.8 mEq/L (ref 3.5–5.1)
Sodium: 138 mEq/L (ref 136–145)
TOTAL PROTEIN: 7.3 g/dL (ref 6.4–8.3)

## 2015-11-21 LAB — CBC WITH DIFFERENTIAL/PLATELET
BASO%: 0.7 % (ref 0.0–2.0)
Basophils Absolute: 0.1 10*3/uL (ref 0.0–0.1)
EOS ABS: 0.1 10*3/uL (ref 0.0–0.5)
EOS%: 0.5 % (ref 0.0–7.0)
HEMATOCRIT: 34.9 % (ref 34.8–46.6)
HEMOGLOBIN: 11.4 g/dL — AB (ref 11.6–15.9)
LYMPH#: 0.6 10*3/uL — AB (ref 0.9–3.3)
LYMPH%: 3.9 % — AB (ref 14.0–49.7)
MCH: 29.6 pg (ref 25.1–34.0)
MCHC: 32.6 g/dL (ref 31.5–36.0)
MCV: 90.7 fL (ref 79.5–101.0)
MONO#: 0.7 10*3/uL (ref 0.1–0.9)
MONO%: 4.8 % (ref 0.0–14.0)
NEUT%: 90.1 % — ABNORMAL HIGH (ref 38.4–76.8)
NEUTROS ABS: 12.9 10*3/uL — AB (ref 1.5–6.5)
PLATELETS: 188 10*3/uL (ref 145–400)
RBC: 3.84 10*6/uL (ref 3.70–5.45)
RDW: 14.9 % — ABNORMAL HIGH (ref 11.2–14.5)
WBC: 14.4 10*3/uL — ABNORMAL HIGH (ref 3.9–10.3)

## 2015-11-21 MED ORDER — SODIUM CHLORIDE 0.9 % IV SOLN
Freq: Once | INTRAVENOUS | Status: AC
  Administered 2015-11-21: 15:00:00 via INTRAVENOUS

## 2015-11-21 MED ORDER — HEPARIN SOD (PORK) LOCK FLUSH 100 UNIT/ML IV SOLN
500.0000 [IU] | Freq: Once | INTRAVENOUS | Status: AC | PRN
  Administered 2015-11-21: 500 [IU]
  Filled 2015-11-21: qty 5

## 2015-11-21 MED ORDER — DOXORUBICIN HCL CHEMO IV INJECTION 2 MG/ML
50.0000 mg/m2 | Freq: Once | INTRAVENOUS | Status: AC
  Administered 2015-11-21: 94 mg via INTRAVENOUS
  Filled 2015-11-21: qty 47

## 2015-11-21 MED ORDER — SODIUM CHLORIDE 0.9% FLUSH
10.0000 mL | INTRAVENOUS | Status: DC | PRN
  Administered 2015-11-21: 10 mL
  Filled 2015-11-21: qty 10

## 2015-11-21 MED ORDER — PALONOSETRON HCL INJECTION 0.25 MG/5ML
0.2500 mg | Freq: Once | INTRAVENOUS | Status: AC
  Administered 2015-11-21: 0.25 mg via INTRAVENOUS

## 2015-11-21 MED ORDER — SODIUM CHLORIDE 0.9 % IV SOLN
Freq: Once | INTRAVENOUS | Status: AC
  Administered 2015-11-21: 15:00:00 via INTRAVENOUS
  Filled 2015-11-21: qty 5

## 2015-11-21 MED ORDER — PALONOSETRON HCL INJECTION 0.25 MG/5ML
INTRAVENOUS | Status: AC
  Filled 2015-11-21: qty 5

## 2015-11-21 MED ORDER — PEGFILGRASTIM 6 MG/0.6ML ~~LOC~~ PSKT
6.0000 mg | PREFILLED_SYRINGE | Freq: Once | SUBCUTANEOUS | Status: AC
  Administered 2015-11-21: 6 mg via SUBCUTANEOUS
  Filled 2015-11-21: qty 0.6

## 2015-11-21 MED ORDER — SODIUM CHLORIDE 0.9 % IV SOLN
500.0000 mg/m2 | Freq: Once | INTRAVENOUS | Status: AC
  Administered 2015-11-21: 940 mg via INTRAVENOUS
  Filled 2015-11-21: qty 47

## 2015-11-21 NOTE — Patient Instructions (Signed)
[facility] Discharge Instructions for Patients Receiving Chemotherapy  Today you received the following chemotherapy agents : Adriamycin,  Cytoxan.  To help prevent nausea and vomiting after your treatment, we encourage you to take your nausea medication as prescribed.   If you develop nausea and vomiting that is not controlled by your nausea medication, call the clinic.   BELOW ARE SYMPTOMS THAT SHOULD BE REPORTED IMMEDIATELY:  *FEVER GREATER THAN 100.5 F  *CHILLS WITH OR WITHOUT FEVER  NAUSEA AND VOMITING THAT IS NOT CONTROLLED WITH YOUR NAUSEA MEDICATION  *UNUSUAL SHORTNESS OF BREATH  *UNUSUAL BRUISING OR BLEEDING  TENDERNESS IN MOUTH AND THROAT WITH OR WITHOUT PRESENCE OF ULCERS  *URINARY PROBLEMS  *BOWEL PROBLEMS  UNUSUAL RASH Items with * indicate a potential emergency and should be followed up as soon as possible.  Feel free to call the clinic you have any questions or concerns. The clinic phone number is [phone].  Please show the CHEMO ALERT CARD at check-in to the Emergency Department and triage nurse.   

## 2015-11-21 NOTE — Telephone Encounter (Signed)
Gave patient avs report and appointments for April  °

## 2015-11-21 NOTE — Progress Notes (Signed)
Patient Care Team: Unk Pinto, MD as PCP - General (Internal Medicine) Ralene Bathe, MD as Consulting Physician (Ophthalmology) Serafina Mitchell, MD as Consulting Physician (Vascular Surgery) Carol Ada, MD as Consulting Physician (Gastroenterology) Amy Martinique, MD as Consulting Physician (Dermatology) Nicholas Lose, MD as Consulting Physician (Hematology and Oncology) Excell Seltzer, MD as Consulting Physician (General Surgery)  SUMMARY OF ONCOLOGIC HISTORY:   Breast cancer of upper-outer quadrant of left female breast ([facility])   08/03/2015 Mammogram Left Breast: 6 mm mass @ 1 o clock   08/09/2015 Initial Diagnosis Left breast 1:00 position: Invasive ductal carcinoma grade 1-2, ER 100%, PR 10%, HER-2 negative ratio 1.79, Ki-67 15%, T1 BN 0 stage I a clinical stage   08/29/2015 Surgery Left lumpectomy: invasive ductal carcinoma 1.2 cm with LVID, with DCIS intermediate grade, 1 sentinel node positive, ER 100%, PR 10%, HER-2 negative ratio 1.79, Ki-67 15% , margins negative, Mammaprint high risk, luminal B, T1cN1 stage II a   10/11/2015 -  Chemotherapy Adjuvant chemotherapy with dose dense Adriamycin and Cytoxan 4 followed by Abraxane weekly 12    CHIEF COMPLIANT: Cycle 4 of dose dense Adriamycin and Cytoxan  INTERVAL HISTORY: Jacqueline Jenkins is a 65 year old with above-mentioned history of left breast cancer currently on adjuvant chemotherapy and today's cycle 4 of dose dense Adriamycin and Cytoxan. She does not have any nausea vomiting but she just does not feel well. She is mostly fatigued. Denies any further problems with mouth sores. Denies any diarrhea or constipation. She is tired of feeling ill.  REVIEW OF SYSTEMS:   Constitutional: Denies fevers, chills or abnormal weight loss, complains of fatigue Eyes: Denies blurriness of vision Ears, nose, mouth, throat, and face: Denies mucositis or sore throat Respiratory: Denies cough, dyspnea or wheezes Cardiovascular: Denies  palpitation, chest discomfort Gastrointestinal:  Denies nausea, heartburn or change in bowel habits Skin: Denies abnormal skin rashes Lymphatics: Denies new lymphadenopathy or easy bruising Neurological:Denies numbness, tingling or new weaknesses Behavioral/Psych: Mood is stable, no new changes  Extremities: No lower extremity edema Breast:  denies any pain or lumps or nodules in either breasts All other systems were reviewed with the patient and are negative.  I have reviewed the past medical history, past surgical history, social history and family history with the patient and they are unchanged from previous note.  ALLERGIES:  is allergic to lescol; lipitor; and vytorin.  MEDICATIONS:  Current Outpatient Prescriptions  Medication Sig Dispense Refill  . aspirin 81 MG tablet Take 81 mg by mouth daily. Reported on 11/08/2015    . benazepril-hydrochlorthiazide (LOTENSIN HCT) 20-12.5 MG tablet TAKE 1 TABLET BY MOUTH DAILY. 90 tablet 1  . Calcium Carbonate-Vitamin D (CALCIUM + D PO) Take 1 tablet by mouth 2 (two) times daily.    Marland Kitchen dexamethasone (DECADRON) 4 MG tablet Take 1 tablet (4 mg total) by mouth 2 (two) times daily. Take 2 tablets by mouth once a day on the day after chemotherapy and then take 2 tablets two times a day for 2 days. Take with food. 30 tablet 1  . fluticasone (FLONASE) 50 MCG/ACT nasal spray Place 1 spray into both nostrils as needed for allergies or rhinitis.    Marland Kitchen HYDROcodone-acetaminophen (NORCO/VICODIN) 5-325 MG tablet Take 1-2 tablets by mouth every 4 (four) hours as needed for moderate pain or severe pain. (Patient not taking: Reported on 11/08/2015) 20 tablet 0  . lidocaine-prilocaine (EMLA) cream Apply to affected area once 30 g 3  . loratadine (CLARITIN) 10 MG tablet Take  10 mg by mouth daily.    Marland Kitchen LORazepam (ATIVAN) 0.5 MG tablet Take 1 tablet (0.5 mg total) by mouth at bedtime. (Patient not taking: Reported on 11/08/2015) 30 tablet 0  . magic mouthwash w/lidocaine SOLN  5-37m  QID prn 240 mL 1  . Multiple Vitamin (MULTIVITAMIN) tablet Take 1 tablet by mouth at bedtime.     . Omega-3 Fatty Acids (FISH OIL) 1200 MG CAPS Take 1 capsule by mouth daily.     .Marland Kitchenomeprazole (PRILOSEC) 10 MG capsule Take 10 mg by mouth daily.    . ondansetron (ZOFRAN) 8 MG tablet Take 1 tablet (8 mg total) by mouth 2 (two) times daily as needed. Start on the third day after chemotherapy. 30 tablet 1  . prochlorperazine (COMPAZINE) 10 MG tablet Take 1 tablet (10 mg total) by mouth every 6 (six) hours as needed (Nausea or vomiting). (Patient not taking: Reported on 11/08/2015) 30 tablet 1   No current facility-administered medications for this visit.   Facility-Administered Medications Ordered in Other Visits  Medication Dose Route Frequency Provider Last Rate Last Dose  . sodium chloride flush (NS) 0.9 % injection 10 mL  10 mL Intracatheter PRN VNicholas Lose MD   10 mL at 11/21/15 1626    PHYSICAL EXAMINATION: ECOG PERFORMANCE STATUS: 1 - Symptomatic but completely ambulatory  Filed Vitals:   11/21/15 1322  BP: 117/69  Pulse: 97  Temp: 98.2 F (36.8 C)  Resp: 18   Filed Weights   11/21/15 1322  Weight: 161 lb (73.029 kg)    GENERAL:alert, no distress and comfortable SKIN: skin color, texture, turgor are normal, no rashes or significant lesions EYES: normal, Conjunctiva are pink and non-injected, sclera clear OROPHARYNX:no exudate, no erythema and lips, buccal mucosa, and tongue normal  NECK: supple, thyroid normal size, non-tender, without nodularity LYMPH:  no palpable lymphadenopathy in the cervical, axillary or inguinal LUNGS: clear to auscultation and percussion with normal breathing effort HEART: regular rate & rhythm and no murmurs and no lower extremity edema ABDOMEN:abdomen soft, non-tender and normal bowel sounds MUSCULOSKELETAL:no cyanosis of digits and no clubbing  NEURO: alert & oriented x 3 with fluent speech, no focal motor/sensory deficits EXTREMITIES: No  lower extremity edema   LABORATORY DATA:  I have reviewed the data as listed   Chemistry      Component Value Date/Time   NA 138 11/21/2015 1312   NA 137 10/08/2015 1430   K 3.8 11/21/2015 1312   K 3.9 10/08/2015 1430   CL 101 10/08/2015 1430   CO2 26 11/21/2015 1312   CO2 27 10/08/2015 1430   BUN 12.7 11/21/2015 1312   BUN 16 10/08/2015 1430   CREATININE 0.9 11/21/2015 1312   CREATININE 0.64 10/08/2015 1430   CREATININE 0.62 06/07/2015 1602      Component Value Date/Time   CALCIUM 9.7 11/21/2015 1312   CALCIUM 9.3 10/08/2015 1430   ALKPHOS 125 11/21/2015 1312   ALKPHOS 58 06/07/2015 1602   AST 15 11/21/2015 1312   AST 22 06/07/2015 1602   ALT 11 11/21/2015 1312   ALT 21 06/07/2015 1602   BILITOT 0.30 11/21/2015 1312   BILITOT 0.6 06/07/2015 1602       Lab Results  Component Value Date   WBC 14.4* 11/21/2015   HGB 11.4* 11/21/2015   HCT 34.9 11/21/2015   MCV 90.7 11/21/2015   PLT 188 11/21/2015   NEUTROABS 12.9* 11/21/2015     ASSESSMENT & PLAN:  Breast cancer of upper-outer quadrant of  left female breast [facility]) Left lumpectomy 08/29/2015: Invasive ductal carcinoma 1.2 cm with LVID, with DCIS intermediate grade, 1 sentinel node positive, ER 100%, PR 10%, HER-2 negative ratio 1.79, Ki-67 15% , margins negative, Mammaprint high risk, luminal B, T1cN1 stage II a. Patient does not need axillary lymph node dissection based on current NCCN guidelines and ACOSOG Z 11 clinical trial; luminal type B and high-risk phenotype. Risk of relapse without chemotherapy 24% versus 12% with chemotherapy.  Treatment plan: 1. Dose dense Adriamycin and Cytoxan 4 followed by Abraxane weekly 12 2. Adjuvant radiation therapy followed by 3. Adjuvant antiestrogen therapy ----------------------------------------------------------------------------------------------------------------- Current treatment: Cycle 4 day 1 dose dense Adriamycin and Cytoxan Echocardiogram showed an ejection  fraction of 55-60%  Chemo Toxicities: 1. Severe fatigue day 4- day 8 continuing to be a problem 2. Leukopenia: I decreased the dosage of cycle 2 of chemotherapy 3. Rash on the leg: improved 4. Canker sores on the right side of the cheek and tongue: Encouraged her to drink more water and use salt water gargles. She is using biotin.  BRCA2 mutation: Oophorectomy will be delayed after chemotherapy.  Patient is very anxious because her husband has Parkinson's plus disease and requires a lot of assistance at home. With her current chemotherapy she is worried that she could get weaker and not be able to take care of her husband.  Return to clinic in 2 weeks for cycle 1 Abraxane.    No orders of the defined types were placed in this encounter.   The patient has a good understanding of the overall plan. she agrees with it. she will call with any problems that may develop before the next visit here.   Rulon Eisenmenger, MD 11/21/2015

## 2015-11-21 NOTE — Assessment & Plan Note (Signed)
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 Patient does not need axillary lymph node dissection based on current NCCN guidelines and ACOSOG Z 11 clinical trial; luminal type B and high-risk phenotype. Risk of relapse without chemotherapy 24% versus 12% with chemotherapy.  Treatment plan: 1. Dose dense Adriamycin and Cytoxan 4 followed by Abraxane weekly 12 2. Adjuvant radiation therapy followed by 3. Adjuvant antiestrogen therapy ----------------------------------------------------------------------------------------------------------------- Current treatment: Cycle 4 day 1 dose dense Adriamycin and Cytoxan Echocardiogram showed an ejection fraction of 55-60%  Chemo Toxicities: 1. Severe fatigue day 4- day 8 continuing to be a problem 2. Leukopenia: I decreased the dosage of cycle 2 of chemotherapy 3. Rash on the leg: improved 4. Canker sores on the right side of the cheek and tongue: Encouraged her to drink more water and use salt water gargles. She is using biotin.  BRCA2 mutation: Oophorectomy will be delayed after chemotherapy.  Patient is very anxious because her husband has Parkinson's plus disease and requires a lot of assistance at home. With her current chemotherapy she is worried that she could get weaker and not be able to take care of her husband.  Return to clinic in 2 weeks for cycle 1 Abraxane.

## 2015-12-06 ENCOUNTER — Other Ambulatory Visit: Payer: Self-pay | Admitting: Hematology and Oncology

## 2015-12-06 ENCOUNTER — Ambulatory Visit (HOSPITAL_BASED_OUTPATIENT_CLINIC_OR_DEPARTMENT_OTHER): Payer: Medicare Other | Admitting: Hematology and Oncology

## 2015-12-06 ENCOUNTER — Ambulatory Visit (HOSPITAL_BASED_OUTPATIENT_CLINIC_OR_DEPARTMENT_OTHER): Payer: Medicare Other

## 2015-12-06 ENCOUNTER — Encounter: Payer: Self-pay | Admitting: Hematology and Oncology

## 2015-12-06 ENCOUNTER — Other Ambulatory Visit (HOSPITAL_BASED_OUTPATIENT_CLINIC_OR_DEPARTMENT_OTHER): Payer: Medicare Other

## 2015-12-06 ENCOUNTER — Other Ambulatory Visit: Payer: Self-pay

## 2015-12-06 VITALS — BP 112/67 | HR 93 | Temp 98.5°F | Resp 18 | Ht 65.0 in | Wt 155.0 lb

## 2015-12-06 DIAGNOSIS — C50412 Malignant neoplasm of upper-outer quadrant of left female breast: Secondary | ICD-10-CM

## 2015-12-06 DIAGNOSIS — Z5111 Encounter for antineoplastic chemotherapy: Secondary | ICD-10-CM | POA: Diagnosis not present

## 2015-12-06 DIAGNOSIS — D701 Agranulocytosis secondary to cancer chemotherapy: Secondary | ICD-10-CM

## 2015-12-06 LAB — COMPREHENSIVE METABOLIC PANEL
ALBUMIN: 3.8 g/dL (ref 3.5–5.0)
ALK PHOS: 103 U/L (ref 40–150)
ALT: 16 U/L (ref 0–55)
ANION GAP: 9 meq/L (ref 3–11)
AST: 18 U/L (ref 5–34)
BILIRUBIN TOTAL: 0.46 mg/dL (ref 0.20–1.20)
BUN: 11.7 mg/dL (ref 7.0–26.0)
CO2: 26 mEq/L (ref 22–29)
Calcium: 9.6 mg/dL (ref 8.4–10.4)
Chloride: 102 mEq/L (ref 98–109)
Creatinine: 0.9 mg/dL (ref 0.6–1.1)
EGFR: 64 mL/min/{1.73_m2} — AB (ref >90)
Glucose: 130 mg/dl (ref 70–140)
Potassium: 3.5 mEq/L (ref 3.5–5.1)
Sodium: 137 mEq/L (ref 136–145)
TOTAL PROTEIN: 7 g/dL (ref 6.4–8.3)

## 2015-12-06 LAB — CBC WITH DIFFERENTIAL/PLATELET
BASO%: 0.7 % (ref 0.0–2.0)
BASOS ABS: 0.1 10*3/uL (ref 0.0–0.1)
EOS ABS: 0 10*3/uL (ref 0.0–0.5)
EOS%: 0.1 % (ref 0.0–7.0)
HCT: 33.1 % — ABNORMAL LOW (ref 34.8–46.6)
HEMOGLOBIN: 11 g/dL — AB (ref 11.6–15.9)
LYMPH%: 2.8 % — AB (ref 14.0–49.7)
MCH: 30.2 pg (ref 25.1–34.0)
MCHC: 33.2 g/dL (ref 31.5–36.0)
MCV: 90.9 fL (ref 79.5–101.0)
MONO#: 0.6 10*3/uL (ref 0.1–0.9)
MONO%: 5.6 % (ref 0.0–14.0)
NEUT%: 90.8 % — ABNORMAL HIGH (ref 38.4–76.8)
NEUTROS ABS: 9.6 10*3/uL — AB (ref 1.5–6.5)
PLATELETS: 227 10*3/uL (ref 145–400)
RBC: 3.64 10*6/uL — AB (ref 3.70–5.45)
RDW: 16.8 % — ABNORMAL HIGH (ref 11.2–14.5)
WBC: 10.5 10*3/uL — AB (ref 3.9–10.3)
lymph#: 0.3 10*3/uL — ABNORMAL LOW (ref 0.9–3.3)

## 2015-12-06 MED ORDER — SODIUM CHLORIDE 0.9 % IV SOLN
Freq: Once | INTRAVENOUS | Status: AC
  Administered 2015-12-06: 15:00:00 via INTRAVENOUS

## 2015-12-06 MED ORDER — SODIUM CHLORIDE 0.9% FLUSH
10.0000 mL | INTRAVENOUS | Status: DC | PRN
  Administered 2015-12-06: 10 mL
  Filled 2015-12-06: qty 10

## 2015-12-06 MED ORDER — PACLITAXEL PROTEIN-BOUND CHEMO INJECTION 100 MG
80.0000 mg/m2 | Freq: Once | INTRAVENOUS | Status: AC
  Administered 2015-12-06: 150 mg via INTRAVENOUS
  Filled 2015-12-06: qty 30

## 2015-12-06 MED ORDER — PROCHLORPERAZINE MALEATE 10 MG PO TABS
10.0000 mg | ORAL_TABLET | Freq: Once | ORAL | Status: AC
  Administered 2015-12-06: 10 mg via ORAL

## 2015-12-06 MED ORDER — PROCHLORPERAZINE MALEATE 10 MG PO TABS
ORAL_TABLET | ORAL | Status: AC
  Filled 2015-12-06: qty 1

## 2015-12-06 MED ORDER — HEPARIN SOD (PORK) LOCK FLUSH 100 UNIT/ML IV SOLN
500.0000 [IU] | Freq: Once | INTRAVENOUS | Status: AC | PRN
  Administered 2015-12-06: 500 [IU]
  Filled 2015-12-06: qty 5

## 2015-12-06 NOTE — Patient Instructions (Signed)
[facility] Discharge Instructions for Patients Receiving Chemotherapy  Today you received the following chemotherapy agents :  Abraxane.  To help prevent nausea and vomiting after your treatment, we encourage you to take your nausea medication as prescribed.   If you develop nausea and vomiting that is not controlled by your nausea medication, call the clinic.   BELOW ARE SYMPTOMS THAT SHOULD BE REPORTED IMMEDIATELY:  *FEVER GREATER THAN 100.5 F  *CHILLS WITH OR WITHOUT FEVER  NAUSEA AND VOMITING THAT IS NOT CONTROLLED WITH YOUR NAUSEA MEDICATION  *UNUSUAL SHORTNESS OF BREATH  *UNUSUAL BRUISING OR BLEEDING  TENDERNESS IN MOUTH AND THROAT WITH OR WITHOUT PRESENCE OF ULCERS  *URINARY PROBLEMS  *BOWEL PROBLEMS  UNUSUAL RASH Items with * indicate a potential emergency and should be followed up as soon as possible.  Feel free to call the clinic you have any questions or concerns. The clinic phone number is [phone].  Please show the [facility] at check-in to the Emergency Department and triage nurse. Nanoparticle Albumin-Bound Paclitaxel injection What is this medicine? NANOPARTICLE ALBUMIN-BOUND PACLITAXEL (Na no PAHR ti kuhl al BYOO muhn-bound PAK li TAX el) is a chemotherapy drug. It targets fast dividing cells, like cancer cells, and causes these cells to die. This medicine is used to treat advanced breast cancer and advanced lung cancer. This medicine may be used for other purposes; ask your health care provider or pharmacist if you have questions. What should I tell my health care provider before I take this medicine? They need to know if you have any of these conditions: -kidney disease -liver disease -low blood counts, like low platelets, red blood cells, or white blood cells -recent or ongoing radiation therapy -an unusual or allergic reaction to paclitaxel, albumin, other chemotherapy, other medicines, foods, dyes, or preservatives -pregnant  or trying to get pregnant -breast-feeding How should I use this medicine? This drug is given as an infusion into a vein. It is administered in a hospital or clinic by a specially trained health care professional. Talk to your pediatrician regarding the use of this medicine in children. Special care may be needed. Overdosage: If you think you have taken too much of this medicine contact a poison control center or emergency room at once. NOTE: This medicine is only for you. Do not share this medicine with others. What if I miss a dose? It is important not to miss your dose. Call your doctor or health care professional if you are unable to keep an appointment. What may interact with this medicine? -cyclosporine -diazepam -ketoconazole -medicines to increase blood counts like filgrastim, pegfilgrastim, sargramostim -other chemotherapy drugs like cisplatin, doxorubicin, epirubicin, etoposide, teniposide, vincristine -quinidine -testosterone -vaccines -verapamil Talk to your doctor or health care professional before taking any of these medicines: -acetaminophen -aspirin -ibuprofen -ketoprofen -naproxen This list may not describe all possible interactions. Give your health care provider a list of all the medicines, herbs, non-prescription drugs, or dietary supplements you use. Also tell them if you smoke, drink alcohol, or use illegal drugs. Some items may interact with your medicine. What should I watch for while using this medicine? Your condition will be monitored carefully while you are receiving this medicine. You will need important blood work done while you are taking this medicine. This drug may make you feel generally unwell. This is not uncommon, as chemotherapy can affect healthy cells as well as cancer cells. Report any side effects. Continue your course of treatment even though you feel  ill unless your doctor tells you to stop. In some cases, you may be given additional medicines  to help with side effects. Follow all directions for their use. Call your doctor or health care professional for advice if you get a fever, chills or sore throat, or other symptoms of a cold or flu. Do not treat yourself. This drug decreases your body's ability to fight infections. Try to avoid being around people who are sick. This medicine may increase your risk to bruise or bleed. Call your doctor or health care professional if you notice any unusual bleeding. Be careful brushing and flossing your teeth or using a toothpick because you may get an infection or bleed more easily. If you have any dental work done, tell your dentist you are receiving this medicine. Avoid taking products that contain aspirin, acetaminophen, ibuprofen, naproxen, or ketoprofen unless instructed by your doctor. These medicines may hide a fever. Do not become pregnant while taking this medicine. Women should inform their doctor if they wish to become pregnant or think they might be pregnant. There is a potential for serious side effects to an unborn child. Talk to your health care professional or pharmacist for more information. Do not breast-feed an infant while taking this medicine. Men are advised not to father a child while receiving this medicine. What side effects may I notice from receiving this medicine? Side effects that you should report to your doctor or health care professional as soon as possible: -allergic reactions like skin rash, itching or hives, swelling of the face, lips, or tongue -low blood counts - This drug may decrease the number of white blood cells, red blood cells and platelets. You may be at increased risk for infections and bleeding. -signs of infection - fever or chills, cough, sore throat, pain or difficulty passing urine -signs of decreased platelets or bleeding - bruising, pinpoint red spots on the skin, black, tarry stools, nosebleeds -signs of decreased red blood cells - unusually weak or  tired, fainting spells, lightheadedness -breathing problems -changes in vision -chest pain -high or low blood pressure -mouth sores -nausea and vomiting -pain, swelling, redness or irritation at the injection site -pain, tingling, numbness in the hands or feet -slow or irregular heartbeat -swelling of the ankle, feet, hands Side effects that usually do not require medical attention (report to your doctor or health care professional if they continue or are bothersome): -aches, pains -changes in the color of fingernails -diarrhea -hair loss -loss of appetite This list may not describe all possible side effects. Call your doctor for medical advice about side effects. You may report side effects to FDA at 1-800-FDA-1088. Where should I keep my medicine? This drug is given in a hospital or clinic and will not be stored at home. NOTE: This sheet is a summary. It may not cover all possible information. If you have questions about this medicine, talk to your doctor, pharmacist, or health care provider.    2016, [employer]. (2012-10-11 16:48:50)

## 2015-12-06 NOTE — Progress Notes (Signed)
Patient Care Team: Unk Pinto, MD as PCP - General (Internal Medicine) Ralene Bathe, MD as Consulting Physician (Ophthalmology) Serafina Mitchell, MD as Consulting Physician (Vascular Surgery) Carol Ada, MD as Consulting Physician (Gastroenterology) Amy Martinique, MD as Consulting Physician (Dermatology) Nicholas Lose, MD as Consulting Physician (Hematology and Oncology) Excell Seltzer, MD as Consulting Physician (General Surgery)  DIAGNOSIS: No matching staging information was found for the patient.  SUMMARY OF ONCOLOGIC HISTORY:   Breast cancer of upper-outer quadrant of left female breast ([place])   08/03/2015 Mammogram Left Breast: 6 mm mass @ 1 o clock   08/09/2015 Initial Diagnosis Left breast 1:00 position: Invasive ductal carcinoma grade 1-2, ER 100%, PR 10%, HER-2 negative ratio 1.79, Ki-67 15%, T1 BN 0 stage I a clinical stage   08/29/2015 Surgery Left lumpectomy: invasive ductal carcinoma 1.2 cm with LVID, with DCIS intermediate grade, 1 sentinel node positive, ER 100%, PR 10%, HER-2 negative ratio 1.79, Ki-67 15% , margins negative, Mammaprint high risk, luminal B, T1cN1 stage II a   10/11/2015 -  Chemotherapy Adjuvant chemotherapy with dose dense Adriamycin and Cytoxan 4 followed by Abraxane weekly 12    CHIEF COMPLIANT: Cycle 1 of Abraxane  INTERVAL HISTORY: Jacqueline Jenkins is a 65 year old with above-mentioned history left breast cancer treated with lumpectomy and is now on adjuvant chemotherapy. She is today here to receive first cycle of Abraxane. She complains of severe fatigue. Decreased appetite and decreased taste. Mouth sores resolved.  REVIEW OF SYSTEMS:   Constitutional: Denies fevers, chills or abnormal weight loss Eyes: Denies blurriness of vision Ears, nose, mouth, throat, and face: Denies mucositis or sore throat Respiratory: Denies cough, dyspnea or wheezes Cardiovascular: Denies palpitation, chest discomfort Gastrointestinal:  Denies nausea, heartburn  or change in bowel habits Skin: Denies abnormal skin rashes Lymphatics: Denies new lymphadenopathy or easy bruising Neurological:Denies numbness, tingling or new weaknesses Behavioral/Psych: Mood is stable, no new changes  Extremities: No lower extremity edema  All other systems were reviewed with the patient and are negative.  I have reviewed the past medical history, past surgical history, social history and family history with the patient and they are unchanged from previous note.  ALLERGIES:  is allergic to lescol; lipitor; and vytorin.  MEDICATIONS:  Current Outpatient Prescriptions  Medication Sig Dispense Refill  . aspirin 81 MG tablet Take 81 mg by mouth daily. Reported on 11/08/2015    . benazepril-hydrochlorthiazide (LOTENSIN HCT) 20-12.5 MG tablet TAKE 1 TABLET BY MOUTH DAILY. 90 tablet 1  . Calcium Carbonate-Vitamin D (CALCIUM + D PO) Take 1 tablet by mouth 2 (two) times daily.    . fluticasone (FLONASE) 50 MCG/ACT nasal spray Place 1 spray into both nostrils as needed for allergies or rhinitis.    Marland Kitchen HYDROcodone-acetaminophen (NORCO/VICODIN) 5-325 MG tablet Take 1-2 tablets by mouth every 4 (four) hours as needed for moderate pain or severe pain. (Patient not taking: Reported on 11/08/2015) 20 tablet 0  . loratadine (CLARITIN) 10 MG tablet Take 10 mg by mouth daily.    . magic mouthwash w/lidocaine SOLN 5-60m  QID prn 240 mL 1  . Multiple Vitamin (MULTIVITAMIN) tablet Take 1 tablet by mouth at bedtime.     . Omega-3 Fatty Acids (FISH OIL) 1200 MG CAPS Take 1 capsule by mouth daily.     .Marland Kitchenomeprazole (PRILOSEC) 10 MG capsule Take 10 mg by mouth daily.     No current facility-administered medications for this visit.   Facility-Administered Medications Ordered in Other Visits  Medication  Dose Route Frequency Provider Last Rate Last Dose  . heparin lock flush 100 unit/mL  500 Units Intracatheter Once PRN Nicholas Lose, MD      . PACLitaxel-protein bound (ABRAXANE) chemo infusion  150 mg  80 mg/m2 (Treatment Plan Actual) Intravenous Once Nicholas Lose, MD      . sodium chloride flush (NS) 0.9 % injection 10 mL  10 mL Intracatheter PRN Nicholas Lose, MD        PHYSICAL EXAMINATION: ECOG PERFORMANCE STATUS: 1 - Symptomatic but completely ambulatory  Filed Vitals:   12/06/15 1429  BP: 112/67  Pulse: 93  Temp: 98.5 F (36.9 C)  Resp: 18   Filed Weights   12/06/15 1429  Weight: 155 lb (70.308 kg)    GENERAL:alert, no distress and comfortable SKIN: skin color, texture, turgor are normal, no rashes or significant lesions EYES: normal, Conjunctiva are pink and non-injected, sclera clear OROPHARYNX:no exudate, no erythema and lips, buccal mucosa, and tongue normal  NECK: supple, thyroid normal size, non-tender, without nodularity LYMPH:  no palpable lymphadenopathy in the cervical, axillary or inguinal LUNGS: clear to auscultation and percussion with normal breathing effort HEART: regular rate & rhythm and no murmurs and no lower extremity edema ABDOMEN:abdomen soft, non-tender and normal bowel sounds MUSCULOSKELETAL:no cyanosis of digits and no clubbing  NEURO: alert & oriented x 3 with fluent speech, no focal motor/sensory deficits EXTREMITIES: No lower extremity edema  LABORATORY DATA:  I have reviewed the data as listed   Chemistry      Component Value Date/Time   NA 137 12/06/2015 1459   NA 137 10/08/2015 1430   K 3.5 12/06/2015 1459   K 3.9 10/08/2015 1430   CL 101 10/08/2015 1430   CO2 26 12/06/2015 1459   CO2 27 10/08/2015 1430   BUN 11.7 12/06/2015 1459   BUN 16 10/08/2015 1430   CREATININE 0.9 12/06/2015 1459   CREATININE 0.64 10/08/2015 1430   CREATININE 0.62 06/07/2015 1602      Component Value Date/Time   CALCIUM 9.6 12/06/2015 1459   CALCIUM 9.3 10/08/2015 1430   ALKPHOS 103 12/06/2015 1459   ALKPHOS 58 06/07/2015 1602   AST 18 12/06/2015 1459   AST 22 06/07/2015 1602   ALT 16 12/06/2015 1459   ALT 21 06/07/2015 1602   BILITOT  0.46 12/06/2015 1459   BILITOT 0.6 06/07/2015 1602       Lab Results  Component Value Date   WBC 10.5* 12/06/2015   HGB 11.0* 12/06/2015   HCT 33.1* 12/06/2015   MCV 90.9 12/06/2015   PLT 227 12/06/2015   NEUTROABS 9.6* 12/06/2015   ASSESSMENT & PLAN:  Breast cancer of upper-outer quadrant of left female breast ([place]) Left lumpectomy 08/29/2015: Invasive ductal carcinoma 1.2 cm with LVID, with DCIS intermediate grade, 1 sentinel node positive, ER 100%, PR 10%, HER-2 negative ratio 1.79, Ki-67 15% , margins negative, Mammaprint high risk, luminal B, T1cN1 stage II a. Patient does not need axillary lymph node dissection based on current NCCN guidelines and ACOSOG Z 11 clinical trial; luminal type B and high-risk phenotype. Risk of relapse without chemotherapy 24% versus 12% with chemotherapy.  Treatment plan: 1. Dose dense Adriamycin and Cytoxan 4 followed by Abraxane weekly 12 2. Adjuvant radiation therapy followed by 3. Adjuvant antiestrogen therapy ----------------------------------------------------------------------------------------------------------------- Current treatment: Completed 4 cycles ofdose dense Adriamycin and Cytoxan, Today is cycle 1/12 Abraxane Echocardiogram showed an ejection fraction of 55-60%  Chemo Toxicities: 1. Severe fatigue day 4- day 8 continuing to be a  problem 2. Leukopenia: I decreased the dosage of cycle 2 of chemotherapy 3. Rash on the leg: improved 4. Canker sores on the right side of the cheek and tongue: Encouraged her to drink more water and use salt water gargles. She is using biotin.  BRCA2 mutation: Oophorectomy will be delayed after chemotherapy.  Patient is very anxious because her husband has Parkinson's plus disease and requires a lot of assistance at home. With her current chemotherapy she is worried that she could get weaker and not be able to take care of her husband.  Return to clinic in 2 weeks for cycle 3 Abraxane.   No  orders of the defined types were placed in this encounter.   The patient has a good understanding of the overall plan. she agrees with it. she will call with any problems that may develop before the next visit here.   Rulon Eisenmenger, MD 12/06/2015

## 2015-12-06 NOTE — Progress Notes (Signed)
Unable to get in to exam room prior to MD.  No assessment performed.  

## 2015-12-06 NOTE — Assessment & Plan Note (Addendum)
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 Patient does not need axillary lymph node dissection based on current NCCN guidelines and ACOSOG Z 11 clinical trial; luminal type B and high-risk phenotype. Risk of relapse without chemotherapy 24% versus 12% with chemotherapy.  Treatment plan: 1. Dose dense Adriamycin and Cytoxan 4 followed by Abraxane weekly 12 2. Adjuvant radiation therapy followed by 3. Adjuvant antiestrogen therapy ----------------------------------------------------------------------------------------------------------------- Current treatment: Completed 4 cycles ofdose dense Adriamycin and Cytoxan, Today is cycle 1/12 Abraxane Echocardiogram showed an ejection fraction of 55-60%  Chemo Toxicities: 1. Severe fatigue day 4- day 8 continuing to be a problem 2. Leukopenia: I decreased the dosage of cycle 2 of chemotherapy 3. Rash on the leg: improved 4. Canker sores on the right side of the cheek and tongue: Encouraged her to drink more water and use salt water gargles. She is using biotin.  BRCA2 mutation: Oophorectomy will be delayed after chemotherapy.  Patient is very anxious because her husband has Parkinson's plus disease and requires a lot of assistance at home. With her current chemotherapy she is worried that she could get weaker and not be able to take care of her husband.  Return to clinic in 2 weeks for cycle 3 Abraxane.

## 2015-12-07 ENCOUNTER — Telehealth: Payer: Self-pay

## 2015-12-07 ENCOUNTER — Telehealth: Payer: Self-pay | Admitting: Hematology and Oncology

## 2015-12-07 NOTE — Telephone Encounter (Signed)
Continued weely rx/la appt per 4/6 pof. Pt will get updated sch on 4/13 visit

## 2015-12-07 NOTE — Telephone Encounter (Signed)
lvm to call if any problems after chemo.

## 2015-12-07 NOTE — Telephone Encounter (Signed)
-----   Message from Ignacia Felling, RN sent at 12/06/2015  3:31 PM EDT ----- Regarding: Dr. Lindi Adie  chemo follow up call 1st abraxane   Patient phone [phone]

## 2015-12-13 ENCOUNTER — Encounter: Payer: Self-pay | Admitting: *Deleted

## 2015-12-13 ENCOUNTER — Ambulatory Visit (HOSPITAL_BASED_OUTPATIENT_CLINIC_OR_DEPARTMENT_OTHER): Payer: Medicare Other

## 2015-12-13 ENCOUNTER — Other Ambulatory Visit (HOSPITAL_BASED_OUTPATIENT_CLINIC_OR_DEPARTMENT_OTHER): Payer: Medicare Other

## 2015-12-13 VITALS — BP 115/61 | HR 97 | Temp 98.6°F | Resp 18

## 2015-12-13 DIAGNOSIS — C50412 Malignant neoplasm of upper-outer quadrant of left female breast: Secondary | ICD-10-CM | POA: Diagnosis present

## 2015-12-13 DIAGNOSIS — Z5111 Encounter for antineoplastic chemotherapy: Secondary | ICD-10-CM | POA: Diagnosis present

## 2015-12-13 LAB — COMPREHENSIVE METABOLIC PANEL
ALBUMIN: 3.8 g/dL (ref 3.5–5.0)
ALK PHOS: 68 U/L (ref 40–150)
ALT: 18 U/L (ref 0–55)
AST: 22 U/L (ref 5–34)
Anion Gap: 11 mEq/L (ref 3–11)
BUN: 9.5 mg/dL (ref 7.0–26.0)
CALCIUM: 9.7 mg/dL (ref 8.4–10.4)
CHLORIDE: 102 meq/L (ref 98–109)
CO2: 26 mEq/L (ref 22–29)
Creatinine: 0.9 mg/dL (ref 0.6–1.1)
EGFR: 65 mL/min/{1.73_m2} — ABNORMAL LOW (ref >90)
Glucose: 117 mg/dl (ref 70–140)
Potassium: 3.6 mEq/L (ref 3.5–5.1)
Sodium: 139 mEq/L (ref 136–145)
Total Bilirubin: 0.49 mg/dL (ref 0.20–1.20)
Total Protein: 6.9 g/dL (ref 6.4–8.3)

## 2015-12-13 LAB — CBC WITH DIFFERENTIAL/PLATELET
BASO%: 2.5 % — AB (ref 0.0–2.0)
Basophils Absolute: 0.1 10*3/uL (ref 0.0–0.1)
EOS%: 1.5 % (ref 0.0–7.0)
Eosinophils Absolute: 0.1 10*3/uL (ref 0.0–0.5)
HEMATOCRIT: 29.9 % — AB (ref 34.8–46.6)
HEMOGLOBIN: 10.1 g/dL — AB (ref 11.6–15.9)
LYMPH#: 0.3 10*3/uL — AB (ref 0.9–3.3)
LYMPH%: 5.9 % — ABNORMAL LOW (ref 14.0–49.7)
MCH: 31.1 pg (ref 25.1–34.0)
MCHC: 33.8 g/dL (ref 31.5–36.0)
MCV: 92 fL (ref 79.5–101.0)
MONO#: 0.6 10*3/uL (ref 0.1–0.9)
MONO%: 11 % (ref 0.0–14.0)
NEUT#: 4.2 10*3/uL (ref 1.5–6.5)
NEUT%: 79.1 % — ABNORMAL HIGH (ref 38.4–76.8)
Platelets: 383 10*3/uL (ref 145–400)
RBC: 3.25 10*6/uL — ABNORMAL LOW (ref 3.70–5.45)
RDW: 17.2 % — AB (ref 11.2–14.5)
WBC: 5.3 10*3/uL (ref 3.9–10.3)

## 2015-12-13 MED ORDER — PACLITAXEL PROTEIN-BOUND CHEMO INJECTION 100 MG
80.0000 mg/m2 | Freq: Once | INTRAVENOUS | Status: AC
  Administered 2015-12-13: 150 mg via INTRAVENOUS
  Filled 2015-12-13: qty 30

## 2015-12-13 MED ORDER — PROCHLORPERAZINE MALEATE 10 MG PO TABS
ORAL_TABLET | ORAL | Status: AC
  Filled 2015-12-13: qty 1

## 2015-12-13 MED ORDER — PROCHLORPERAZINE MALEATE 10 MG PO TABS
10.0000 mg | ORAL_TABLET | Freq: Once | ORAL | Status: AC
  Administered 2015-12-13: 10 mg via ORAL

## 2015-12-13 MED ORDER — SODIUM CHLORIDE 0.9 % IV SOLN
Freq: Once | INTRAVENOUS | Status: AC
  Administered 2015-12-13: 15:00:00 via INTRAVENOUS

## 2015-12-13 MED ORDER — SODIUM CHLORIDE 0.9% FLUSH
10.0000 mL | INTRAVENOUS | Status: DC | PRN
  Administered 2015-12-13: 10 mL
  Filled 2015-12-13: qty 10

## 2015-12-13 MED ORDER — HEPARIN SOD (PORK) LOCK FLUSH 100 UNIT/ML IV SOLN
500.0000 [IU] | Freq: Once | INTRAVENOUS | Status: AC | PRN
  Administered 2015-12-13: 500 [IU]
  Filled 2015-12-13: qty 5

## 2015-12-13 NOTE — Patient Instructions (Signed)
[facility] Discharge Instructions for Patients Receiving Chemotherapy  Today you received the following chemotherapy agents: Abraxane   To help prevent nausea and vomiting after your treatment, we encourage you to take your nausea medication as directed.    If you develop nausea and vomiting that is not controlled by your nausea medication, call the clinic.   BELOW ARE SYMPTOMS THAT SHOULD BE REPORTED IMMEDIATELY:  *FEVER GREATER THAN 100.5 F  *CHILLS WITH OR WITHOUT FEVER  NAUSEA AND VOMITING THAT IS NOT CONTROLLED WITH YOUR NAUSEA MEDICATION  *UNUSUAL SHORTNESS OF BREATH  *UNUSUAL BRUISING OR BLEEDING  TENDERNESS IN MOUTH AND THROAT WITH OR WITHOUT PRESENCE OF ULCERS  *URINARY PROBLEMS  *BOWEL PROBLEMS  UNUSUAL RASH Items with * indicate a potential emergency and should be followed up as soon as possible.  Feel free to call the clinic you have any questions or concerns. The clinic phone number is [phone].  Please show the CHEMO ALERT CARD at check-in to the Emergency Department and triage nurse.   

## 2015-12-20 ENCOUNTER — Ambulatory Visit (HOSPITAL_BASED_OUTPATIENT_CLINIC_OR_DEPARTMENT_OTHER): Payer: Medicare Other

## 2015-12-20 ENCOUNTER — Ambulatory Visit (HOSPITAL_BASED_OUTPATIENT_CLINIC_OR_DEPARTMENT_OTHER): Payer: Medicare Other | Admitting: Hematology and Oncology

## 2015-12-20 ENCOUNTER — Telehealth: Payer: Self-pay | Admitting: Hematology and Oncology

## 2015-12-20 ENCOUNTER — Other Ambulatory Visit (HOSPITAL_BASED_OUTPATIENT_CLINIC_OR_DEPARTMENT_OTHER): Payer: Medicare Other

## 2015-12-20 ENCOUNTER — Encounter: Payer: Self-pay | Admitting: Hematology and Oncology

## 2015-12-20 VITALS — BP 108/63 | HR 87 | Temp 98.4°F | Resp 18 | Wt 154.8 lb

## 2015-12-20 DIAGNOSIS — C50412 Malignant neoplasm of upper-outer quadrant of left female breast: Secondary | ICD-10-CM

## 2015-12-20 DIAGNOSIS — Z5111 Encounter for antineoplastic chemotherapy: Secondary | ICD-10-CM

## 2015-12-20 LAB — CBC WITH DIFFERENTIAL/PLATELET
BASO%: 3.3 % — AB (ref 0.0–2.0)
BASOS ABS: 0.1 10*3/uL (ref 0.0–0.1)
EOS%: 6.9 % (ref 0.0–7.0)
Eosinophils Absolute: 0.3 10*3/uL (ref 0.0–0.5)
HCT: 30.6 % — ABNORMAL LOW (ref 34.8–46.6)
HGB: 10.2 g/dL — ABNORMAL LOW (ref 11.6–15.9)
LYMPH%: 18.7 % (ref 14.0–49.7)
MCH: 30.9 pg (ref 25.1–34.0)
MCHC: 33.3 g/dL (ref 31.5–36.0)
MCV: 92.7 fL (ref 79.5–101.0)
MONO#: 0.2 10*3/uL (ref 0.1–0.9)
MONO%: 4.5 % (ref 0.0–14.0)
NEUT#: 2.8 10*3/uL (ref 1.5–6.5)
NEUT%: 66.6 % (ref 38.4–76.8)
Platelets: 303 10*3/uL (ref 145–400)
RBC: 3.3 10*6/uL — AB (ref 3.70–5.45)
RDW: 17.6 % — ABNORMAL HIGH (ref 11.2–14.5)
WBC: 4.2 10*3/uL (ref 3.9–10.3)
lymph#: 0.8 10*3/uL — ABNORMAL LOW (ref 0.9–3.3)
nRBC: 0 % (ref 0–0)

## 2015-12-20 LAB — COMPREHENSIVE METABOLIC PANEL
ALT: 18 U/L (ref 0–55)
ANION GAP: 9 meq/L (ref 3–11)
AST: 23 U/L (ref 5–34)
Albumin: 3.9 g/dL (ref 3.5–5.0)
Alkaline Phosphatase: 67 U/L (ref 40–150)
BILIRUBIN TOTAL: 0.52 mg/dL (ref 0.20–1.20)
BUN: 12.4 mg/dL (ref 7.0–26.0)
CALCIUM: 9.7 mg/dL (ref 8.4–10.4)
CHLORIDE: 105 meq/L (ref 98–109)
CO2: 24 meq/L (ref 22–29)
Creatinine: 0.8 mg/dL (ref 0.6–1.1)
EGFR: 75 mL/min/{1.73_m2} — AB (ref >90)
Glucose: 116 mg/dl (ref 70–140)
Potassium: 4.1 mEq/L (ref 3.5–5.1)
Sodium: 138 mEq/L (ref 136–145)
Total Protein: 6.9 g/dL (ref 6.4–8.3)

## 2015-12-20 MED ORDER — SODIUM CHLORIDE 0.9 % IV SOLN
Freq: Once | INTRAVENOUS | Status: AC
  Administered 2015-12-20: 15:00:00 via INTRAVENOUS

## 2015-12-20 MED ORDER — HEPARIN SOD (PORK) LOCK FLUSH 100 UNIT/ML IV SOLN
500.0000 [IU] | Freq: Once | INTRAVENOUS | Status: AC | PRN
  Administered 2015-12-20: 500 [IU]
  Filled 2015-12-20: qty 5

## 2015-12-20 MED ORDER — PROCHLORPERAZINE MALEATE 10 MG PO TABS
10.0000 mg | ORAL_TABLET | Freq: Once | ORAL | Status: AC
  Administered 2015-12-20: 10 mg via ORAL

## 2015-12-20 MED ORDER — SODIUM CHLORIDE 0.9% FLUSH
10.0000 mL | INTRAVENOUS | Status: DC | PRN
  Administered 2015-12-20: 10 mL
  Filled 2015-12-20: qty 10

## 2015-12-20 MED ORDER — PACLITAXEL PROTEIN-BOUND CHEMO INJECTION 100 MG
80.0000 mg/m2 | Freq: Once | INTRAVENOUS | Status: AC
  Administered 2015-12-20: 150 mg via INTRAVENOUS
  Filled 2015-12-20: qty 30

## 2015-12-20 MED ORDER — PROCHLORPERAZINE MALEATE 10 MG PO TABS
ORAL_TABLET | ORAL | Status: AC
  Filled 2015-12-20: qty 1

## 2015-12-20 NOTE — Telephone Encounter (Signed)
appt made and avs will print in treatment room °

## 2015-12-20 NOTE — Patient Instructions (Signed)
[facility] Discharge Instructions for Patients Receiving Chemotherapy  Today you received the following chemotherapy agents Abraxane To help prevent nausea and vomiting after your treatment, we encourage you to take your nausea medication as prescribed.   If you develop nausea and vomiting that is not controlled by your nausea medication, call the clinic.   BELOW ARE SYMPTOMS THAT SHOULD BE REPORTED IMMEDIATELY:  *FEVER GREATER THAN 100.5 F  *CHILLS WITH OR WITHOUT FEVER  NAUSEA AND VOMITING THAT IS NOT CONTROLLED WITH YOUR NAUSEA MEDICATION  *UNUSUAL SHORTNESS OF BREATH  *UNUSUAL BRUISING OR BLEEDING  TENDERNESS IN MOUTH AND THROAT WITH OR WITHOUT PRESENCE OF ULCERS  *URINARY PROBLEMS  *BOWEL PROBLEMS  UNUSUAL RASH Items with * indicate a potential emergency and should be followed up as soon as possible.  Feel free to call the clinic you have any questions or concerns. The clinic phone number is [phone].  Please show the CHEMO ALERT CARD at check-in to the Emergency Department and triage nurse.   

## 2015-12-20 NOTE — Assessment & Plan Note (Signed)
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 Patient does not need axillary lymph node dissection based on current NCCN guidelines and ACOSOG Z 11 clinical trial; luminal type B and high-risk phenotype. Risk of relapse without chemotherapy 24% versus 12% with chemotherapy.  Treatment plan: 1. Dose dense Adriamycin and Cytoxan 4 followed by Abraxane weekly 12 2. Adjuvant radiation therapy followed by 3. Adjuvant antiestrogen therapy ----------------------------------------------------------------------------------------------------------------- Current treatment: Completed 4 cycles ofdose dense Adriamycin and Cytoxan, Today is cycle 3/12 Abraxane Echocardiogram showed an ejection fraction of 55-60%  Chemo Toxicities: 1. Severe fatigue day 4- day 8 continuing to be a problem 2. Leukopenia: I decreased the dosage of cycle 2 of chemotherapy 3. Rash on the leg: improved 4. Canker sores on the right side of the cheek and tongue: Encouraged her to drink more water and use salt water gargles. She is using biotin.  BRCA2 mutation: Oophorectomy will be delayed after chemotherapy.  Patient is very anxious because her husband has Parkinson's plus disease and requires a lot of assistance at home. With her current chemotherapy she is worried that she could get weaker and not be able to take care of her husband.  Return to clinic in 2 weeks for cycle 5 Abraxane.

## 2015-12-20 NOTE — Progress Notes (Signed)
Patient Care Team: Unk Pinto, MD as PCP - General (Internal Medicine) Ralene Bathe, MD as Consulting Physician (Ophthalmology) Serafina Mitchell, MD as Consulting Physician (Vascular Surgery) Carol Ada, MD as Consulting Physician (Gastroenterology) Amy Martinique, MD as Consulting Physician (Dermatology) Nicholas Lose, MD as Consulting Physician (Hematology and Oncology) Excell Seltzer, MD as Consulting Physician (General Surgery)  SUMMARY OF ONCOLOGIC HISTORY:   Breast cancer of upper-outer quadrant of left female breast ([place])   08/03/2015 Mammogram Left Breast: 6 mm mass @ 1 o clock   08/09/2015 Initial Diagnosis Left breast 1:00 position: Invasive ductal carcinoma grade 1-2, ER 100%, PR 10%, HER-2 negative ratio 1.79, Ki-67 15%, T1 BN 0 stage I a clinical stage   08/29/2015 Surgery Left lumpectomy: invasive ductal carcinoma 1.2 cm with LVID, with DCIS intermediate grade, 1 sentinel node positive, ER 100%, PR 10%, HER-2 negative ratio 1.79, Ki-67 15% , margins negative, Mammaprint high risk, luminal B, T1cN1 stage II a   10/11/2015 -  Chemotherapy Adjuvant chemotherapy with dose dense Adriamycin and Cytoxan 4 followed by Abraxane weekly 12   CHIEF COMPLIANT: Abraxane cycle 3  INTERVAL HISTORY: Jacqueline Jenkins is a 65 year old above-mentioned history of left breast cancer currently on adjuvant chemotherapy and today's cycle 3 of Abraxane. She has tolerated cycle 1 and 2 Abraxane she knew well. She did have fatigue as a major side effect but otherwise did not have any nausea or vomiting. Her case is starting to come back. Denies fevers or chills. Denies any mouth sores.  REVIEW OF SYSTEMS:   Constitutional: Denies fevers, chills or abnormal weight loss Eyes: Denies blurriness of vision Ears, nose, mouth, throat, and face: Denies mucositis or sore throat Respiratory: Denies cough, dyspnea or wheezes Cardiovascular: Denies palpitation, chest discomfort Gastrointestinal:  Denies  nausea, heartburn or change in bowel habits Skin: Denies abnormal skin rashes Lymphatics: Denies new lymphadenopathy or easy bruising Neurological:Denies numbness, tingling or new weaknesses Behavioral/Psych: Mood is stable, no new changes  Extremities: No lower extremity edema Breast:  denies any pain or lumps or nodules in either breasts All other systems were reviewed with the patient and are negative.  I have reviewed the past medical history, past surgical history, social history and family history with the patient and they are unchanged from previous note.  ALLERGIES:  is allergic to lescol; lipitor; and vytorin.  MEDICATIONS:  Current Outpatient Prescriptions  Medication Sig Dispense Refill  . aspirin 81 MG tablet Take 81 mg by mouth daily. Reported on 11/08/2015    . benazepril-hydrochlorthiazide (LOTENSIN HCT) 20-12.5 MG tablet TAKE 1 TABLET BY MOUTH DAILY. 90 tablet 1  . Calcium Carbonate-Vitamin D (CALCIUM + D PO) Take 1 tablet by mouth 2 (two) times daily.    . fluticasone (FLONASE) 50 MCG/ACT nasal spray Place 1 spray into both nostrils as needed for allergies or rhinitis.    Marland Kitchen HYDROcodone-acetaminophen (NORCO/VICODIN) 5-325 MG tablet Take 1-2 tablets by mouth every 4 (four) hours as needed for moderate pain or severe pain. (Patient not taking: Reported on 11/08/2015) 20 tablet 0  . loratadine (CLARITIN) 10 MG tablet Take 10 mg by mouth daily.    . magic mouthwash w/lidocaine SOLN 5-62m  QID prn 240 mL 1  . Multiple Vitamin (MULTIVITAMIN) tablet Take 1 tablet by mouth at bedtime.     . Omega-3 Fatty Acids (FISH OIL) 1200 MG CAPS Take 1 capsule by mouth daily.     .Marland Kitchenomeprazole (PRILOSEC) 10 MG capsule Take 10 mg by mouth daily.  No current facility-administered medications for this visit.   Facility-Administered Medications Ordered in Other Visits  Medication Dose Route Frequency Provider Last Rate Last Dose  . heparin lock flush 100 unit/mL  500 Units Intracatheter Once  PRN Nicholas Lose, MD      . PACLitaxel-protein bound (ABRAXANE) chemo infusion 150 mg  80 mg/m2 (Treatment Plan Actual) Intravenous Once Nicholas Lose, MD 60 mL/hr at 12/20/15 1511 150 mg at 12/20/15 1511  . sodium chloride flush (NS) 0.9 % injection 10 mL  10 mL Intracatheter PRN Nicholas Lose, MD        PHYSICAL EXAMINATION: ECOG PERFORMANCE STATUS: 1 - Symptomatic but completely ambulatory  Filed Vitals:   12/20/15 1406  BP: 108/63  Pulse: 87  Temp: 98.4 F (36.9 C)  Resp: 18   Filed Weights   12/20/15 1406  Weight: 154 lb 12.8 oz (70.217 kg)    GENERAL:alert, no distress and comfortable SKIN: skin color, texture, turgor are normal, no rashes or significant lesions EYES: normal, Conjunctiva are pink and non-injected, sclera clear OROPHARYNX:no exudate, no erythema and lips, buccal mucosa, and tongue normal  NECK: supple, thyroid normal size, non-tender, without nodularity LYMPH:  no palpable lymphadenopathy in the cervical, axillary or inguinal LUNGS: clear to auscultation and percussion with normal breathing effort HEART: regular rate & rhythm and no murmurs and no lower extremity edema ABDOMEN:abdomen soft, non-tender and normal bowel sounds MUSCULOSKELETAL:no cyanosis of digits and no clubbing  NEURO: alert & oriented x 3 with fluent speech, no focal motor/sensory deficits EXTREMITIES: No lower extremity edema  LABORATORY DATA:  I have reviewed the data as listed   Chemistry      Component Value Date/Time   NA 138 12/20/2015 1350   NA 137 10/08/2015 1430   K 4.1 12/20/2015 1350   K 3.9 10/08/2015 1430   CL 101 10/08/2015 1430   CO2 24 12/20/2015 1350   CO2 27 10/08/2015 1430   BUN 12.4 12/20/2015 1350   BUN 16 10/08/2015 1430   CREATININE 0.8 12/20/2015 1350   CREATININE 0.64 10/08/2015 1430   CREATININE 0.62 06/07/2015 1602      Component Value Date/Time   CALCIUM 9.7 12/20/2015 1350   CALCIUM 9.3 10/08/2015 1430   ALKPHOS 67 12/20/2015 1350   ALKPHOS 58  06/07/2015 1602   AST 23 12/20/2015 1350   AST 22 06/07/2015 1602   ALT 18 12/20/2015 1350   ALT 21 06/07/2015 1602   BILITOT 0.52 12/20/2015 1350   BILITOT 0.6 06/07/2015 1602     Lab Results  Component Value Date   WBC 4.2 12/20/2015   HGB 10.2* 12/20/2015   HCT 30.6* 12/20/2015   MCV 92.7 12/20/2015   PLT 303 12/20/2015   NEUTROABS 2.8 12/20/2015   ASSESSMENT & PLAN:  Breast cancer of upper-outer quadrant of left female breast ([place]) Left lumpectomy 08/29/2015: Invasive ductal carcinoma 1.2 cm with LVID, with DCIS intermediate grade, 1 sentinel node positive, ER 100%, PR 10%, HER-2 negative ratio 1.79, Ki-67 15% , margins negative, Mammaprint high risk, luminal B, T1cN1 stage II a. Patient does not need axillary lymph node dissection based on current NCCN guidelines and ACOSOG Z 11 clinical trial; luminal type B and high-risk phenotype. Risk of relapse without chemotherapy 24% versus 12% with chemotherapy.  Treatment plan: 1. Dose dense Adriamycin and Cytoxan 4 followed by Abraxane weekly 12 2. Adjuvant radiation therapy followed by 3. Adjuvant antiestrogen therapy ----------------------------------------------------------------------------------------------------------------- Current treatment: Completed 4 cycles ofdose dense Adriamycin and Cytoxan, Today is cycle  3/12 Abraxane Echocardiogram showed an ejection fraction of 55-60%  Chemo Toxicities: 1. Severe fatigue day 4- day 8 continuing to be a problem 2. Leukopenia: I decreased the dosage of cycle 2 of chemotherapy 3. Rash on the leg: improved 4. Canker sores on the right side of the cheek and tongue: Encouraged her to drink more water and use salt water gargles. She is using biotin.  BRCA2 mutation: Oophorectomy will be delayed after chemotherapy.  Patient is very anxious because her husband has Parkinson's plus disease and requires a lot of assistance at home. With her current chemotherapy she is worried that she  could get weaker and not be able to take care of her husband.  Return to clinic in 2 weeks for cycle 5 Abraxane.   No orders of the defined types were placed in this encounter.   The patient has a good understanding of the overall plan. she agrees with it. she will call with any problems that may develop before the next visit here.   Rulon Eisenmenger, MD 12/20/2015

## 2015-12-27 ENCOUNTER — Ambulatory Visit (HOSPITAL_BASED_OUTPATIENT_CLINIC_OR_DEPARTMENT_OTHER): Payer: Medicare Other

## 2015-12-27 ENCOUNTER — Other Ambulatory Visit (HOSPITAL_BASED_OUTPATIENT_CLINIC_OR_DEPARTMENT_OTHER): Payer: Medicare Other

## 2015-12-27 VITALS — BP 112/64 | HR 79 | Temp 99.0°F | Resp 17

## 2015-12-27 DIAGNOSIS — Z5111 Encounter for antineoplastic chemotherapy: Secondary | ICD-10-CM

## 2015-12-27 DIAGNOSIS — C50412 Malignant neoplasm of upper-outer quadrant of left female breast: Secondary | ICD-10-CM | POA: Diagnosis not present

## 2015-12-27 LAB — CBC WITH DIFFERENTIAL/PLATELET
BASO%: 3.5 % — ABNORMAL HIGH (ref 0.0–2.0)
Basophils Absolute: 0.2 10*3/uL — ABNORMAL HIGH (ref 0.0–0.1)
EOS ABS: 0.2 10*3/uL (ref 0.0–0.5)
EOS%: 4.2 % (ref 0.0–7.0)
HCT: 30.3 % — ABNORMAL LOW (ref 34.8–46.6)
HEMOGLOBIN: 10 g/dL — AB (ref 11.6–15.9)
LYMPH%: 12.3 % — ABNORMAL LOW (ref 14.0–49.7)
MCH: 30.8 pg (ref 25.1–34.0)
MCHC: 32.9 g/dL (ref 31.5–36.0)
MCV: 93.7 fL (ref 79.5–101.0)
MONO#: 0.3 10*3/uL (ref 0.1–0.9)
MONO%: 7.8 % (ref 0.0–14.0)
NEUT%: 72.2 % (ref 38.4–76.8)
NEUTROS ABS: 3.3 10*3/uL (ref 1.5–6.5)
PLATELETS: 321 10*3/uL (ref 145–400)
RBC: 3.24 10*6/uL — ABNORMAL LOW (ref 3.70–5.45)
RDW: 18.5 % — AB (ref 11.2–14.5)
WBC: 4.5 10*3/uL (ref 3.9–10.3)
lymph#: 0.6 10*3/uL — ABNORMAL LOW (ref 0.9–3.3)

## 2015-12-27 LAB — COMPREHENSIVE METABOLIC PANEL
ALBUMIN: 3.9 g/dL (ref 3.5–5.0)
ALK PHOS: 63 U/L (ref 40–150)
ALT: 20 U/L (ref 0–55)
AST: 23 U/L (ref 5–34)
Anion Gap: 8 mEq/L (ref 3–11)
BILIRUBIN TOTAL: 0.46 mg/dL (ref 0.20–1.20)
BUN: 11.4 mg/dL (ref 7.0–26.0)
CO2: 25 mEq/L (ref 22–29)
CREATININE: 0.8 mg/dL (ref 0.6–1.1)
Calcium: 9.6 mg/dL (ref 8.4–10.4)
Chloride: 105 mEq/L (ref 98–109)
EGFR: 76 mL/min/{1.73_m2} — AB (ref >90)
GLUCOSE: 109 mg/dL (ref 70–140)
Potassium: 3.9 mEq/L (ref 3.5–5.1)
SODIUM: 138 meq/L (ref 136–145)
TOTAL PROTEIN: 6.7 g/dL (ref 6.4–8.3)

## 2015-12-27 MED ORDER — PROCHLORPERAZINE MALEATE 10 MG PO TABS
10.0000 mg | ORAL_TABLET | Freq: Once | ORAL | Status: AC
  Administered 2015-12-27: 10 mg via ORAL

## 2015-12-27 MED ORDER — SODIUM CHLORIDE 0.9 % IV SOLN
Freq: Once | INTRAVENOUS | Status: AC
  Administered 2015-12-27: 14:00:00 via INTRAVENOUS

## 2015-12-27 MED ORDER — PACLITAXEL PROTEIN-BOUND CHEMO INJECTION 100 MG
80.0000 mg/m2 | Freq: Once | INTRAVENOUS | Status: AC
  Administered 2015-12-27: 150 mg via INTRAVENOUS
  Filled 2015-12-27: qty 30

## 2015-12-27 MED ORDER — PROCHLORPERAZINE MALEATE 10 MG PO TABS
ORAL_TABLET | ORAL | Status: AC
  Filled 2015-12-27: qty 1

## 2015-12-27 MED ORDER — SODIUM CHLORIDE 0.9% FLUSH
10.0000 mL | INTRAVENOUS | Status: DC | PRN
  Administered 2015-12-27: 10 mL
  Filled 2015-12-27: qty 10

## 2015-12-27 MED ORDER — HEPARIN SOD (PORK) LOCK FLUSH 100 UNIT/ML IV SOLN
500.0000 [IU] | Freq: Once | INTRAVENOUS | Status: AC | PRN
  Administered 2015-12-27: 500 [IU]
  Filled 2015-12-27: qty 5

## 2015-12-27 NOTE — Patient Instructions (Signed)
[facility] Discharge Instructions for Patients Receiving Chemotherapy  Today you received the following chemotherapy agents: Abraxane   To help prevent nausea and vomiting after your treatment, we encourage you to take your nausea medication as directed.    If you develop nausea and vomiting that is not controlled by your nausea medication, call the clinic.   BELOW ARE SYMPTOMS THAT SHOULD BE REPORTED IMMEDIATELY:  *FEVER GREATER THAN 100.5 F  *CHILLS WITH OR WITHOUT FEVER  NAUSEA AND VOMITING THAT IS NOT CONTROLLED WITH YOUR NAUSEA MEDICATION  *UNUSUAL SHORTNESS OF BREATH  *UNUSUAL BRUISING OR BLEEDING  TENDERNESS IN MOUTH AND THROAT WITH OR WITHOUT PRESENCE OF ULCERS  *URINARY PROBLEMS  *BOWEL PROBLEMS  UNUSUAL RASH Items with * indicate a potential emergency and should be followed up as soon as possible.  Feel free to call the clinic you have any questions or concerns. The clinic phone number is [phone].  Please show the CHEMO ALERT CARD at check-in to the Emergency Department and triage nurse.   

## 2016-01-03 ENCOUNTER — Encounter: Payer: Self-pay | Admitting: Hematology and Oncology

## 2016-01-03 ENCOUNTER — Ambulatory Visit (HOSPITAL_BASED_OUTPATIENT_CLINIC_OR_DEPARTMENT_OTHER): Payer: Medicare Other | Admitting: Hematology and Oncology

## 2016-01-03 ENCOUNTER — Telehealth: Payer: Self-pay | Admitting: Hematology and Oncology

## 2016-01-03 ENCOUNTER — Ambulatory Visit (HOSPITAL_BASED_OUTPATIENT_CLINIC_OR_DEPARTMENT_OTHER): Payer: Medicare Other

## 2016-01-03 ENCOUNTER — Other Ambulatory Visit (HOSPITAL_BASED_OUTPATIENT_CLINIC_OR_DEPARTMENT_OTHER): Payer: Medicare Other

## 2016-01-03 VITALS — BP 119/67 | HR 86 | Temp 98.9°F | Resp 18 | Ht 65.0 in | Wt 152.1 lb

## 2016-01-03 DIAGNOSIS — Z5111 Encounter for antineoplastic chemotherapy: Secondary | ICD-10-CM | POA: Diagnosis not present

## 2016-01-03 DIAGNOSIS — C50412 Malignant neoplasm of upper-outer quadrant of left female breast: Secondary | ICD-10-CM

## 2016-01-03 DIAGNOSIS — D701 Agranulocytosis secondary to cancer chemotherapy: Secondary | ICD-10-CM

## 2016-01-03 LAB — COMPREHENSIVE METABOLIC PANEL
ALT: 19 U/L (ref 0–55)
ANION GAP: 9 meq/L (ref 3–11)
AST: 21 U/L (ref 5–34)
Albumin: 4.1 g/dL (ref 3.5–5.0)
Alkaline Phosphatase: 59 U/L (ref 40–150)
BUN: 11 mg/dL (ref 7.0–26.0)
CALCIUM: 9.8 mg/dL (ref 8.4–10.4)
CHLORIDE: 104 meq/L (ref 98–109)
CO2: 26 mEq/L (ref 22–29)
CREATININE: 0.9 mg/dL (ref 0.6–1.1)
EGFR: 69 mL/min/{1.73_m2} — AB (ref >90)
Glucose: 106 mg/dl (ref 70–140)
POTASSIUM: 4.1 meq/L (ref 3.5–5.1)
Sodium: 138 mEq/L (ref 136–145)
Total Bilirubin: 0.44 mg/dL (ref 0.20–1.20)
Total Protein: 7 g/dL (ref 6.4–8.3)

## 2016-01-03 LAB — CBC WITH DIFFERENTIAL/PLATELET
BASO%: 2.1 % — ABNORMAL HIGH (ref 0.0–2.0)
BASOS ABS: 0.1 10*3/uL (ref 0.0–0.1)
EOS ABS: 0.1 10*3/uL (ref 0.0–0.5)
EOS%: 3.4 % (ref 0.0–7.0)
HEMATOCRIT: 32 % — AB (ref 34.8–46.6)
HGB: 10.5 g/dL — ABNORMAL LOW (ref 11.6–15.9)
LYMPH#: 0.5 10*3/uL — AB (ref 0.9–3.3)
LYMPH%: 12.7 % — AB (ref 14.0–49.7)
MCH: 30.9 pg (ref 25.1–34.0)
MCHC: 32.8 g/dL (ref 31.5–36.0)
MCV: 94.1 fL (ref 79.5–101.0)
MONO#: 0.4 10*3/uL (ref 0.1–0.9)
MONO%: 10 % (ref 0.0–14.0)
NEUT#: 2.7 10*3/uL (ref 1.5–6.5)
NEUT%: 71.8 % (ref 38.4–76.8)
PLATELETS: 268 10*3/uL (ref 145–400)
RBC: 3.4 10*6/uL — ABNORMAL LOW (ref 3.70–5.45)
RDW: 17.4 % — ABNORMAL HIGH (ref 11.2–14.5)
WBC: 3.8 10*3/uL — ABNORMAL LOW (ref 3.9–10.3)

## 2016-01-03 MED ORDER — PACLITAXEL PROTEIN-BOUND CHEMO INJECTION 100 MG
80.0000 mg/m2 | Freq: Once | INTRAVENOUS | Status: AC
  Administered 2016-01-03: 150 mg via INTRAVENOUS
  Filled 2016-01-03: qty 30

## 2016-01-03 MED ORDER — SODIUM CHLORIDE 0.9% FLUSH
10.0000 mL | INTRAVENOUS | Status: DC | PRN
  Administered 2016-01-03: 10 mL
  Filled 2016-01-03: qty 10

## 2016-01-03 MED ORDER — PROCHLORPERAZINE MALEATE 10 MG PO TABS
10.0000 mg | ORAL_TABLET | Freq: Once | ORAL | Status: AC
  Administered 2016-01-03: 10 mg via ORAL

## 2016-01-03 MED ORDER — HEPARIN SOD (PORK) LOCK FLUSH 100 UNIT/ML IV SOLN
500.0000 [IU] | Freq: Once | INTRAVENOUS | Status: AC | PRN
  Administered 2016-01-03: 500 [IU]
  Filled 2016-01-03: qty 5

## 2016-01-03 MED ORDER — SODIUM CHLORIDE 0.9 % IV SOLN
Freq: Once | INTRAVENOUS | Status: AC
  Administered 2016-01-03: 14:00:00 via INTRAVENOUS

## 2016-01-03 MED ORDER — PROCHLORPERAZINE MALEATE 10 MG PO TABS
ORAL_TABLET | ORAL | Status: AC
  Filled 2016-01-03: qty 1

## 2016-01-03 NOTE — Patient Instructions (Signed)
[facility] Discharge Instructions for Patients Receiving Chemotherapy  Today you received the following chemotherapy agents Abraxane To help prevent nausea and vomiting after your treatment, we encourage you to take your nausea medication as prescribed.   If you develop nausea and vomiting that is not controlled by your nausea medication, call the clinic.   BELOW ARE SYMPTOMS THAT SHOULD BE REPORTED IMMEDIATELY:  *FEVER GREATER THAN 100.5 F  *CHILLS WITH OR WITHOUT FEVER  NAUSEA AND VOMITING THAT IS NOT CONTROLLED WITH YOUR NAUSEA MEDICATION  *UNUSUAL SHORTNESS OF BREATH  *UNUSUAL BRUISING OR BLEEDING  TENDERNESS IN MOUTH AND THROAT WITH OR WITHOUT PRESENCE OF ULCERS  *URINARY PROBLEMS  *BOWEL PROBLEMS  UNUSUAL RASH Items with * indicate a potential emergency and should be followed up as soon as possible.  Feel free to call the clinic you have any questions or concerns. The clinic phone number is [phone].  Please show the CHEMO ALERT CARD at check-in to the Emergency Department and triage nurse.   

## 2016-01-03 NOTE — Assessment & Plan Note (Signed)
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 Patient does not need axillary lymph node dissection based on current NCCN guidelines and ACOSOG Z 11 clinical trial; luminal type B and high-risk phenotype. Risk of relapse without chemotherapy 24% versus 12% with chemotherapy.  Treatment plan: 1. Dose dense Adriamycin and Cytoxan 4 followed by Abraxane weekly 12 2. Adjuvant radiation therapy followed by 3. Adjuvant antiestrogen therapy ----------------------------------------------------------------------------------------------------------------- Current treatment: Completed 4 cycles ofdose dense Adriamycin and Cytoxan, Today is cycle 5/12 Abraxane Echocardiogram showed an ejection fraction of 55-60%  Chemo Toxicities: 1. Severe fatigue day 4- day 8 continuing to be a problem 2. Leukopenia: I decreased the dosage of cycle 2 of chemotherapy 3. Rash on the leg: improved 4. Canker sores on the right side of the cheek and tongue: Encouraged her to drink more water and use salt water gargles. She is using biotin.  BRCA2 mutation: Oophorectomy will be delayed after chemotherapy.  Patient is very anxious because her husband has Parkinson's plus disease and requires a lot of assistance at home. With her current chemotherapy she is worried that she could get weaker and not be able to take care of her husband.  Return to clinic in 2 weeks for cycle 7 Abraxane.

## 2016-01-03 NOTE — Telephone Encounter (Signed)
appt made and avs printed °

## 2016-01-03 NOTE — Progress Notes (Signed)
Patient Care Team: Unk Pinto, MD as PCP - General (Internal Medicine) Ralene Bathe, MD as Consulting Physician (Ophthalmology) Serafina Mitchell, MD as Consulting Physician (Vascular Surgery) Carol Ada, MD as Consulting Physician (Gastroenterology) Amy Martinique, MD as Consulting Physician (Dermatology) Nicholas Lose, MD as Consulting Physician (Hematology and Oncology) Excell Seltzer, MD as Consulting Physician (General Surgery)  SUMMARY OF ONCOLOGIC HISTORY:   Breast cancer of upper-outer quadrant of left female breast ([place])   08/03/2015 Mammogram Left Breast: 6 mm mass @ 1 o clock   08/09/2015 Initial Diagnosis Left breast 1:00 position: Invasive ductal carcinoma grade 1-2, ER 100%, PR 10%, HER-2 negative ratio 1.79, Ki-67 15%, T1 BN 0 stage I a clinical stage   08/29/2015 Surgery Left lumpectomy: invasive ductal carcinoma 1.2 cm with LVID, with DCIS intermediate grade, 1 sentinel node positive, ER 100%, PR 10%, HER-2 negative ratio 1.79, Ki-67 15% , margins negative, Mammaprint high risk, luminal B, T1cN1 stage II a   10/11/2015 -  Chemotherapy Adjuvant chemotherapy with dose dense Adriamycin and Cytoxan 4 followed by Abraxane weekly 12    CHIEF COMPLIANT:  cycle 5 of Abraxane  INTERVAL HISTORY: Jacqueline Jenkins is a 65 year old with above-mentioned history left breast cancer currently on adjuvant chemotherapy today cycle 5 of Abraxane. She is tolerating chemotherapy extremely well. She is noticed that the taste is coming back. Continues to feel fatigued from chemotherapy. Denies any nausea or vomiting. Denies any bowel disturbances. Denies neuropathy  REVIEW OF SYSTEMS:   Constitutional: Denies fevers, chills or abnormal weight loss Eyes: Denies blurriness of vision Ears, nose, mouth, throat, and face: Denies mucositis or sore throat Respiratory: Denies cough, dyspnea or wheezes Cardiovascular: Denies palpitation, chest discomfort Gastrointestinal:  Denies nausea, heartburn  or change in bowel habits Skin: Denies abnormal skin rashes Lymphatics: Denies new lymphadenopathy or easy bruising Neurological:Denies numbness, tingling or new weaknesses Behavioral/Psych: Mood is stable, no new changes  Extremities: No lower extremity edema Breast:  denies any pain or lumps or nodules in either breasts All other systems were reviewed with the patient and are negative.  I have reviewed the past medical history, past surgical history, social history and family history with the patient and they are unchanged from previous note.  ALLERGIES:  is allergic to lescol; lipitor; and vytorin.  MEDICATIONS:  Current Outpatient Prescriptions  Medication Sig Dispense Refill  . aspirin 81 MG tablet Take 81 mg by mouth daily. Reported on 11/08/2015    . benazepril-hydrochlorthiazide (LOTENSIN HCT) 20-12.5 MG tablet TAKE 1 TABLET BY MOUTH DAILY. 90 tablet 1  . Calcium Carbonate-Vitamin D (CALCIUM + D PO) Take 1 tablet by mouth 2 (two) times daily.    . fluticasone (FLONASE) 50 MCG/ACT nasal spray Place 1 spray into both nostrils as needed for allergies or rhinitis.    Marland Kitchen HYDROcodone-acetaminophen (NORCO/VICODIN) 5-325 MG tablet Take 1-2 tablets by mouth every 4 (four) hours as needed for moderate pain or severe pain. (Patient not taking: Reported on 11/08/2015) 20 tablet 0  . loratadine (CLARITIN) 10 MG tablet Take 10 mg by mouth daily.    . magic mouthwash w/lidocaine SOLN 5-42m  QID prn 240 mL 1  . Multiple Vitamin (MULTIVITAMIN) tablet Take 1 tablet by mouth at bedtime.     . Omega-3 Fatty Acids (FISH OIL) 1200 MG CAPS Take 1 capsule by mouth daily.     .Marland Kitchenomeprazole (PRILOSEC) 10 MG capsule Take 10 mg by mouth daily.     No current facility-administered medications for this visit.  Facility-Administered Medications Ordered in Other Visits  Medication Dose Route Frequency Provider Last Rate Last Dose  . sodium chloride flush (NS) 0.9 % injection 10 mL  10 mL Intracatheter PRN Nicholas Lose, MD   10 mL at 12/20/15 1605    PHYSICAL EXAMINATION: ECOG PERFORMANCE STATUS: 1 - Symptomatic but completely ambulatory  Filed Vitals:   01/03/16 1403  BP: 119/67  Pulse: 86  Temp: 98.9 F (37.2 C)  Resp: 18   Filed Weights   01/03/16 1403  Weight: 152 lb 1.6 oz (68.992 kg)    GENERAL:alert, no distress and comfortable SKIN: skin color, texture, turgor are normal, no rashes or significant lesions EYES: normal, Conjunctiva are pink and non-injected, sclera clear OROPHARYNX:no exudate, no erythema and lips, buccal mucosa, and tongue normal  NECK: supple, thyroid normal size, non-tender, without nodularity LYMPH:  no palpable lymphadenopathy in the cervical, axillary or inguinal LUNGS: clear to auscultation and percussion with normal breathing effort HEART: regular rate & rhythm and no murmurs and no lower extremity edema ABDOMEN:abdomen soft, non-tender and normal bowel sounds MUSCULOSKELETAL:no cyanosis of digits and no clubbing  NEURO: alert & oriented x 3 with fluent speech, no focal motor/sensory deficits EXTREMITIES: No lower extremity edema  LABORATORY DATA:  I have reviewed the data as listed   Chemistry      Component Value Date/Time   NA 138 12/27/2015 1349   NA 137 10/08/2015 1430   K 3.9 12/27/2015 1349   K 3.9 10/08/2015 1430   CL 101 10/08/2015 1430   CO2 25 12/27/2015 1349   CO2 27 10/08/2015 1430   BUN 11.4 12/27/2015 1349   BUN 16 10/08/2015 1430   CREATININE 0.8 12/27/2015 1349   CREATININE 0.64 10/08/2015 1430   CREATININE 0.62 06/07/2015 1602      Component Value Date/Time   CALCIUM 9.6 12/27/2015 1349   CALCIUM 9.3 10/08/2015 1430   ALKPHOS 63 12/27/2015 1349   ALKPHOS 58 06/07/2015 1602   AST 23 12/27/2015 1349   AST 22 06/07/2015 1602   ALT 20 12/27/2015 1349   ALT 21 06/07/2015 1602   BILITOT 0.46 12/27/2015 1349   BILITOT 0.6 06/07/2015 1602       Lab Results  Component Value Date   WBC 3.8* 01/03/2016   HGB 10.5*  01/03/2016   HCT 32.0* 01/03/2016   MCV 94.1 01/03/2016   PLT 268 01/03/2016   NEUTROABS 2.7 01/03/2016     ASSESSMENT & PLAN:  Breast cancer of upper-outer quadrant of left female breast ([place]) Left lumpectomy 08/29/2015: Invasive ductal carcinoma 1.2 cm with LVID, with DCIS intermediate grade, 1 sentinel node positive, ER 100%, PR 10%, HER-2 negative ratio 1.79, Ki-67 15% , margins negative, Mammaprint high risk, luminal B, T1cN1 stage II a. Patient does not need axillary lymph node dissection based on current NCCN guidelines and ACOSOG Z 11 clinical trial; luminal type B and high-risk phenotype. Risk of relapse without chemotherapy 24% versus 12% with chemotherapy.  Treatment plan: 1. Dose dense Adriamycin and Cytoxan 4 followed by Abraxane weekly 12 2. Adjuvant radiation therapy followed by 3. Adjuvant antiestrogen therapy ----------------------------------------------------------------------------------------------------------------- Current treatment: Completed 4 cycles ofdose dense Adriamycin and Cytoxan, Today is cycle 5/12 Abraxane Echocardiogram showed an ejection fraction of 55-60%  Chemo Toxicities: 1. Severe fatigue day 4- day 8 continuing to be a problem 2. Leukopenia: I decreased the dosage of cycle 2 of chemotherapy 3. Rash on the leg: improved 4. Canker sores on the right side of the cheek and  tongue: Encouraged her to drink more water and use salt water gargles. She is using biotin.  BRCA2 mutation: Oophorectomy will be delayed after chemotherapy.  Patient is very anxious because her husband has Parkinson's plus disease and requires a lot of assistance at home. With her current chemotherapy she is worried that she could get weaker and not be able to take care of her husband.  Return to clinic in 2 weeks for cycle 7 Abraxane.    No orders of the defined types were placed in this encounter.   The patient has a good understanding of the overall plan. she agrees  with it. she will call with any problems that may develop before the next visit here.   Rulon Eisenmenger, MD 01/03/2016

## 2016-01-10 ENCOUNTER — Other Ambulatory Visit (HOSPITAL_BASED_OUTPATIENT_CLINIC_OR_DEPARTMENT_OTHER): Payer: Medicare Other

## 2016-01-10 ENCOUNTER — Ambulatory Visit (HOSPITAL_BASED_OUTPATIENT_CLINIC_OR_DEPARTMENT_OTHER): Payer: Medicare Other

## 2016-01-10 VITALS — BP 126/72 | HR 75 | Temp 98.9°F | Resp 18

## 2016-01-10 DIAGNOSIS — Z5111 Encounter for antineoplastic chemotherapy: Secondary | ICD-10-CM | POA: Diagnosis not present

## 2016-01-10 DIAGNOSIS — C50412 Malignant neoplasm of upper-outer quadrant of left female breast: Secondary | ICD-10-CM

## 2016-01-10 LAB — CBC WITH DIFFERENTIAL/PLATELET
BASO%: 3 % — AB (ref 0.0–2.0)
Basophils Absolute: 0.1 10*3/uL (ref 0.0–0.1)
EOS ABS: 0.1 10*3/uL (ref 0.0–0.5)
EOS%: 2.4 % (ref 0.0–7.0)
HEMATOCRIT: 30.9 % — AB (ref 34.8–46.6)
HEMOGLOBIN: 10.4 g/dL — AB (ref 11.6–15.9)
LYMPH#: 0.6 10*3/uL — AB (ref 0.9–3.3)
LYMPH%: 18.4 % (ref 14.0–49.7)
MCH: 31.9 pg (ref 25.1–34.0)
MCHC: 33.7 g/dL (ref 31.5–36.0)
MCV: 94.8 fL (ref 79.5–101.0)
MONO#: 0.3 10*3/uL (ref 0.1–0.9)
MONO%: 9.3 % (ref 0.0–14.0)
NEUT%: 66.9 % (ref 38.4–76.8)
NEUTROS ABS: 2.2 10*3/uL (ref 1.5–6.5)
PLATELETS: 252 10*3/uL (ref 145–400)
RBC: 3.26 10*6/uL — ABNORMAL LOW (ref 3.70–5.45)
RDW: 16.9 % — AB (ref 11.2–14.5)
WBC: 3.3 10*3/uL — AB (ref 3.9–10.3)

## 2016-01-10 LAB — COMPREHENSIVE METABOLIC PANEL
ALBUMIN: 4 g/dL (ref 3.5–5.0)
ALK PHOS: 58 U/L (ref 40–150)
ALT: 17 U/L (ref 0–55)
ANION GAP: 7 meq/L (ref 3–11)
AST: 17 U/L (ref 5–34)
BILIRUBIN TOTAL: 0.39 mg/dL (ref 0.20–1.20)
BUN: 10.8 mg/dL (ref 7.0–26.0)
CO2: 27 meq/L (ref 22–29)
CREATININE: 0.8 mg/dL (ref 0.6–1.1)
Calcium: 9.6 mg/dL (ref 8.4–10.4)
Chloride: 104 mEq/L (ref 98–109)
EGFR: 79 mL/min/{1.73_m2} — AB (ref >90)
Glucose: 104 mg/dl (ref 70–140)
Potassium: 4.2 mEq/L (ref 3.5–5.1)
Sodium: 138 mEq/L (ref 136–145)
TOTAL PROTEIN: 6.7 g/dL (ref 6.4–8.3)

## 2016-01-10 MED ORDER — SODIUM CHLORIDE 0.9 % IV SOLN
Freq: Once | INTRAVENOUS | Status: AC
  Administered 2016-01-10: 15:00:00 via INTRAVENOUS

## 2016-01-10 MED ORDER — PROCHLORPERAZINE MALEATE 10 MG PO TABS
ORAL_TABLET | ORAL | Status: AC
  Filled 2016-01-10: qty 1

## 2016-01-10 MED ORDER — SODIUM CHLORIDE 0.9% FLUSH
10.0000 mL | INTRAVENOUS | Status: DC | PRN
  Administered 2016-01-10: 10 mL
  Filled 2016-01-10: qty 10

## 2016-01-10 MED ORDER — PACLITAXEL PROTEIN-BOUND CHEMO INJECTION 100 MG
80.0000 mg/m2 | Freq: Once | INTRAVENOUS | Status: AC
  Administered 2016-01-10: 150 mg via INTRAVENOUS
  Filled 2016-01-10: qty 30

## 2016-01-10 MED ORDER — HEPARIN SOD (PORK) LOCK FLUSH 100 UNIT/ML IV SOLN
500.0000 [IU] | Freq: Once | INTRAVENOUS | Status: AC | PRN
  Administered 2016-01-10: 500 [IU]
  Filled 2016-01-10: qty 5

## 2016-01-10 MED ORDER — PROCHLORPERAZINE MALEATE 10 MG PO TABS
10.0000 mg | ORAL_TABLET | Freq: Once | ORAL | Status: AC
  Administered 2016-01-10: 10 mg via ORAL

## 2016-01-10 NOTE — Patient Instructions (Signed)
[facility] Discharge Instructions for Patients Receiving Chemotherapy  Today you received the following chemotherapy agents Abraxane To help prevent nausea and vomiting after your treatment, we encourage you to take your nausea medication as prescribed.   If you develop nausea and vomiting that is not controlled by your nausea medication, call the clinic.   BELOW ARE SYMPTOMS THAT SHOULD BE REPORTED IMMEDIATELY:  *FEVER GREATER THAN 100.5 F  *CHILLS WITH OR WITHOUT FEVER  NAUSEA AND VOMITING THAT IS NOT CONTROLLED WITH YOUR NAUSEA MEDICATION  *UNUSUAL SHORTNESS OF BREATH  *UNUSUAL BRUISING OR BLEEDING  TENDERNESS IN MOUTH AND THROAT WITH OR WITHOUT PRESENCE OF ULCERS  *URINARY PROBLEMS  *BOWEL PROBLEMS  UNUSUAL RASH Items with * indicate a potential emergency and should be followed up as soon as possible.  Feel free to call the clinic you have any questions or concerns. The clinic phone number is [phone].  Please show the CHEMO ALERT CARD at check-in to the Emergency Department and triage nurse.   

## 2016-01-17 ENCOUNTER — Ambulatory Visit (HOSPITAL_BASED_OUTPATIENT_CLINIC_OR_DEPARTMENT_OTHER): Payer: Medicare Other | Admitting: Hematology and Oncology

## 2016-01-17 ENCOUNTER — Ambulatory Visit (HOSPITAL_BASED_OUTPATIENT_CLINIC_OR_DEPARTMENT_OTHER): Payer: Medicare Other

## 2016-01-17 ENCOUNTER — Other Ambulatory Visit (HOSPITAL_BASED_OUTPATIENT_CLINIC_OR_DEPARTMENT_OTHER): Payer: Medicare Other

## 2016-01-17 ENCOUNTER — Encounter: Payer: Self-pay | Admitting: Hematology and Oncology

## 2016-01-17 VITALS — BP 107/62 | HR 83 | Temp 99.0°F | Resp 18 | Wt 153.3 lb

## 2016-01-17 DIAGNOSIS — D72818 Other decreased white blood cell count: Secondary | ICD-10-CM | POA: Diagnosis not present

## 2016-01-17 DIAGNOSIS — Z5111 Encounter for antineoplastic chemotherapy: Secondary | ICD-10-CM

## 2016-01-17 DIAGNOSIS — C50412 Malignant neoplasm of upper-outer quadrant of left female breast: Secondary | ICD-10-CM

## 2016-01-17 DIAGNOSIS — D6481 Anemia due to antineoplastic chemotherapy: Secondary | ICD-10-CM

## 2016-01-17 LAB — COMPREHENSIVE METABOLIC PANEL
ALBUMIN: 3.9 g/dL (ref 3.5–5.0)
ALK PHOS: 58 U/L (ref 40–150)
ALT: 15 U/L (ref 0–55)
AST: 19 U/L (ref 5–34)
Anion Gap: 6 mEq/L (ref 3–11)
BILIRUBIN TOTAL: 0.4 mg/dL (ref 0.20–1.20)
BUN: 11 mg/dL (ref 7.0–26.0)
CO2: 27 mEq/L (ref 22–29)
CREATININE: 0.8 mg/dL (ref 0.6–1.1)
Calcium: 9.5 mg/dL (ref 8.4–10.4)
Chloride: 101 mEq/L (ref 98–109)
EGFR: 79 mL/min/{1.73_m2} — ABNORMAL LOW (ref >90)
GLUCOSE: 106 mg/dL (ref 70–140)
Potassium: 4.4 mEq/L (ref 3.5–5.1)
SODIUM: 134 meq/L — AB (ref 136–145)
TOTAL PROTEIN: 6.9 g/dL (ref 6.4–8.3)

## 2016-01-17 LAB — CBC WITH DIFFERENTIAL/PLATELET
BASO%: 3.1 % — ABNORMAL HIGH (ref 0.0–2.0)
Basophils Absolute: 0.1 10*3/uL (ref 0.0–0.1)
EOS ABS: 0.1 10*3/uL (ref 0.0–0.5)
EOS%: 2.6 % (ref 0.0–7.0)
HCT: 31.5 % — ABNORMAL LOW (ref 34.8–46.6)
HEMOGLOBIN: 10.5 g/dL — AB (ref 11.6–15.9)
LYMPH%: 14.6 % (ref 14.0–49.7)
MCH: 31.3 pg (ref 25.1–34.0)
MCHC: 33.4 g/dL (ref 31.5–36.0)
MCV: 93.9 fL (ref 79.5–101.0)
MONO#: 0.3 10*3/uL (ref 0.1–0.9)
MONO%: 9 % (ref 0.0–14.0)
NEUT%: 70.7 % (ref 38.4–76.8)
NEUTROS ABS: 2.4 10*3/uL (ref 1.5–6.5)
Platelets: 250 10*3/uL (ref 145–400)
RBC: 3.36 10*6/uL — ABNORMAL LOW (ref 3.70–5.45)
RDW: 17 % — AB (ref 11.2–14.5)
WBC: 3.3 10*3/uL — AB (ref 3.9–10.3)
lymph#: 0.5 10*3/uL — ABNORMAL LOW (ref 0.9–3.3)

## 2016-01-17 MED ORDER — SODIUM CHLORIDE 0.9% FLUSH
10.0000 mL | INTRAVENOUS | Status: DC | PRN
  Administered 2016-01-17: 10 mL
  Filled 2016-01-17: qty 10

## 2016-01-17 MED ORDER — HEPARIN SOD (PORK) LOCK FLUSH 100 UNIT/ML IV SOLN
500.0000 [IU] | Freq: Once | INTRAVENOUS | Status: AC | PRN
  Administered 2016-01-17: 500 [IU]
  Filled 2016-01-17: qty 5

## 2016-01-17 MED ORDER — PROCHLORPERAZINE MALEATE 10 MG PO TABS
10.0000 mg | ORAL_TABLET | Freq: Once | ORAL | Status: AC
Start: 2016-01-17 — End: 2016-01-17
  Administered 2016-01-17: 10 mg via ORAL

## 2016-01-17 MED ORDER — PACLITAXEL PROTEIN-BOUND CHEMO INJECTION 100 MG
80.0000 mg/m2 | Freq: Once | INTRAVENOUS | Status: AC
  Administered 2016-01-17: 150 mg via INTRAVENOUS
  Filled 2016-01-17: qty 30

## 2016-01-17 MED ORDER — PROCHLORPERAZINE MALEATE 10 MG PO TABS
ORAL_TABLET | ORAL | Status: AC
  Filled 2016-01-17: qty 1

## 2016-01-17 MED ORDER — SODIUM CHLORIDE 0.9 % IV SOLN
Freq: Once | INTRAVENOUS | Status: AC
  Administered 2016-01-17: 14:00:00 via INTRAVENOUS

## 2016-01-17 NOTE — Assessment & Plan Note (Signed)
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 Patient does not need axillary lymph node dissection based on current NCCN guidelines and ACOSOG Z 11 clinical trial; luminal type B and high-risk phenotype. Risk of relapse without chemotherapy 24% versus 12% with chemotherapy.  Treatment plan: 1. Dose dense Adriamycin and Cytoxan 4 followed by Abraxane weekly 12 2. Adjuvant radiation therapy followed by 3. Adjuvant antiestrogen therapy ----------------------------------------------------------------------------------------------------------------- Current treatment: Completed 4 cycles ofdose dense Adriamycin and Cytoxan, Today is cycle 7/12 Abraxane Echocardiogram showed an ejection fraction of 55-60%  Chemo Toxicities: 1. Severe fatigue day 4- day 8 continuing to be a problem 2. Leukopenia: I decreased the dosage of cycle 2 of chemotherapy 3. Rash on the leg: improved 4. Canker sores on the right side of the cheek and tongue: Encouraged her to drink more water and use salt water gargles. She is using biotin.  BRCA2 mutation: Oophorectomy will be delayed after chemotherapy.  Patient is very anxious because her husband has Parkinson's plus disease and requires a lot of assistance at home. With her current chemotherapy she is worried that she could get weaker and not be able to take care of her husband.  Return to clinic in 2 weeks for cycle 9 Abraxane.

## 2016-01-17 NOTE — Patient Instructions (Signed)
[facility] Discharge Instructions for Patients Receiving Chemotherapy  Today you received the following chemotherapy agent: Abraxane   To help prevent nausea and vomiting after your treatment, we encourage you to take your nausea medication as prescribed.    If you develop nausea and vomiting that is not controlled by your nausea medication, call the clinic.   BELOW ARE SYMPTOMS THAT SHOULD BE REPORTED IMMEDIATELY:  *FEVER GREATER THAN 100.5 F  *CHILLS WITH OR WITHOUT FEVER  NAUSEA AND VOMITING THAT IS NOT CONTROLLED WITH YOUR NAUSEA MEDICATION  *UNUSUAL SHORTNESS OF BREATH  *UNUSUAL BRUISING OR BLEEDING  TENDERNESS IN MOUTH AND THROAT WITH OR WITHOUT PRESENCE OF ULCERS  *URINARY PROBLEMS  *BOWEL PROBLEMS  UNUSUAL RASH Items with * indicate a potential emergency and should be followed up as soon as possible.  Feel free to call the clinic you have any questions or concerns. The clinic phone number is [phone].  Please show the CHEMO ALERT CARD at check-in to the Emergency Department and triage nurse.   

## 2016-01-17 NOTE — Progress Notes (Signed)
Patient Care Team: Unk Pinto, MD as PCP - General (Internal Medicine) Ralene Bathe, MD as Consulting Physician (Ophthalmology) Serafina Mitchell, MD as Consulting Physician (Vascular Surgery) Carol Ada, MD as Consulting Physician (Gastroenterology) Amy Martinique, MD as Consulting Physician (Dermatology) Nicholas Lose, MD as Consulting Physician (Hematology and Oncology) Excell Seltzer, MD as Consulting Physician (General Surgery)  SUMMARY OF ONCOLOGIC HISTORY:   Breast cancer of upper-outer quadrant of left female breast ([place])   08/03/2015 Mammogram Left Breast: 6 mm mass @ 1 o clock   08/09/2015 Initial Diagnosis Left breast 1:00 position: Invasive ductal carcinoma grade 1-2, ER 100%, PR 10%, HER-2 negative ratio 1.79, Ki-67 15%, T1 BN 0 stage I a clinical stage   08/29/2015 Surgery Left lumpectomy: invasive ductal carcinoma 1.2 cm with LVID, with DCIS intermediate grade, 1 sentinel node positive, ER 100%, PR 10%, HER-2 negative ratio 1.79, Ki-67 15% , margins negative, Mammaprint high risk, luminal B, T1cN1 stage II a   10/11/2015 -  Chemotherapy Adjuvant chemotherapy with dose dense Adriamycin and Cytoxan 4 followed by Abraxane weekly 12    CHIEF COMPLIANT: Cycle 7/12 Abraxane  INTERVAL HISTORY: Jacqueline Jenkins is a 65 year old with above-mentioned history of breast cancer currently on adjuvant chemotherapy today cycle 7 of Abraxane. She has been tolerating Abraxane extremely well. She does not have any nausea or vomiting. Denies any neuropathy. Her appetite is improving.  REVIEW OF SYSTEMS:   Constitutional: Denies fevers, chills or abnormal weight loss Eyes: Denies blurriness of vision Ears, nose, mouth, throat, and face: Denies mucositis or sore throat Respiratory: Denies cough, dyspnea or wheezes Cardiovascular: Denies palpitation, chest discomfort Gastrointestinal:  Denies nausea, heartburn or change in bowel habits Skin: Denies abnormal skin rashes Lymphatics: Denies  new lymphadenopathy or easy bruising Neurological:Denies numbness, tingling or new weaknesses Behavioral/Psych: Mood is stable, no new changes  Extremities: No lower extremity edema Breast:  denies any pain or lumps or nodules in either breasts All other systems were reviewed with the patient and are negative.  I have reviewed the past medical history, past surgical history, social history and family history with the patient and they are unchanged from previous note.  ALLERGIES:  is allergic to lescol; lipitor; and vytorin.  MEDICATIONS:  Current Outpatient Prescriptions  Medication Sig Dispense Refill  . aspirin 81 MG tablet Take 81 mg by mouth daily. Reported on 11/08/2015    . benazepril-hydrochlorthiazide (LOTENSIN HCT) 20-12.5 MG tablet TAKE 1 TABLET BY MOUTH DAILY. 90 tablet 1  . Calcium Carbonate-Vitamin D (CALCIUM + D PO) Take 1 tablet by mouth 2 (two) times daily.    . fluticasone (FLONASE) 50 MCG/ACT nasal spray Place 1 spray into both nostrils as needed for allergies or rhinitis.    Marland Kitchen HYDROcodone-acetaminophen (NORCO/VICODIN) 5-325 MG tablet Take 1-2 tablets by mouth every 4 (four) hours as needed for moderate pain or severe pain. (Patient not taking: Reported on 11/08/2015) 20 tablet 0  . loratadine (CLARITIN) 10 MG tablet Take 10 mg by mouth daily.    . magic mouthwash w/lidocaine SOLN 5-90m  QID prn 240 mL 1  . Multiple Vitamin (MULTIVITAMIN) tablet Take 1 tablet by mouth at bedtime.     . Omega-3 Fatty Acids (FISH OIL) 1200 MG CAPS Take 1 capsule by mouth daily.     .Marland Kitchenomeprazole (PRILOSEC) 10 MG capsule Take 10 mg by mouth daily.     No current facility-administered medications for this visit.   Facility-Administered Medications Ordered in Other Visits  Medication Dose Route  Frequency Provider Last Rate Last Dose  . sodium chloride flush (NS) 0.9 % injection 10 mL  10 mL Intracatheter PRN Nicholas Lose, MD   10 mL at 12/20/15 1605    PHYSICAL EXAMINATION: ECOG PERFORMANCE  STATUS: 1 - Symptomatic but completely ambulatory  Filed Vitals:   01/17/16 1326  BP: 107/62  Pulse: 83  Temp: 99 F (37.2 C)  Resp: 18   Filed Weights   01/17/16 1326  Weight: 153 lb 4.8 oz (69.536 kg)    GENERAL:alert, no distress and comfortable SKIN: skin color, texture, turgor are normal, no rashes or significant lesions EYES: normal, Conjunctiva are pink and non-injected, sclera clear OROPHARYNX:no exudate, no erythema and lips, buccal mucosa, and tongue normal  NECK: supple, thyroid normal size, non-tender, without nodularity LYMPH:  no palpable lymphadenopathy in the cervical, axillary or inguinal LUNGS: clear to auscultation and percussion with normal breathing effort HEART: regular rate & rhythm and no murmurs and no lower extremity edema ABDOMEN:abdomen soft, non-tender and normal bowel sounds MUSCULOSKELETAL:no cyanosis of digits and no clubbing  NEURO: alert & oriented x 3 with fluent speech, no focal motor/sensory deficits EXTREMITIES: No lower extremity edema  LABORATORY DATA:  I have reviewed the data as listed   Chemistry      Component Value Date/Time   NA 138 01/10/2016 1358   NA 137 10/08/2015 1430   K 4.2 01/10/2016 1358   K 3.9 10/08/2015 1430   CL 101 10/08/2015 1430   CO2 27 01/10/2016 1358   CO2 27 10/08/2015 1430   BUN 10.8 01/10/2016 1358   BUN 16 10/08/2015 1430   CREATININE 0.8 01/10/2016 1358   CREATININE 0.64 10/08/2015 1430   CREATININE 0.62 06/07/2015 1602      Component Value Date/Time   CALCIUM 9.6 01/10/2016 1358   CALCIUM 9.3 10/08/2015 1430   ALKPHOS 58 01/10/2016 1358   ALKPHOS 58 06/07/2015 1602   AST 17 01/10/2016 1358   AST 22 06/07/2015 1602   ALT 17 01/10/2016 1358   ALT 21 06/07/2015 1602   BILITOT 0.39 01/10/2016 1358   BILITOT 0.6 06/07/2015 1602       Lab Results  Component Value Date   WBC 3.3* 01/17/2016   HGB 10.5* 01/17/2016   HCT 31.5* 01/17/2016   MCV 93.9 01/17/2016   PLT 250 01/17/2016    NEUTROABS 2.4 01/17/2016   ASSESSMENT & PLAN:  Breast cancer of upper-outer quadrant of left female breast ([place]) Left lumpectomy 08/29/2015: Invasive ductal carcinoma 1.2 cm with LVID, with DCIS intermediate grade, 1 sentinel node positive, ER 100%, PR 10%, HER-2 negative ratio 1.79, Ki-67 15% , margins negative, Mammaprint high risk, luminal B, T1cN1 stage II a. Patient does not need axillary lymph node dissection based on current NCCN guidelines and ACOSOG Z 11 clinical trial; luminal type B and high-risk phenotype. Risk of relapse without chemotherapy 24% versus 12% with chemotherapy.  Treatment plan: 1. Dose dense Adriamycin and Cytoxan 4 followed by Abraxane weekly 12 2. Adjuvant radiation therapy followed by 3. Adjuvant antiestrogen therapy ----------------------------------------------------------------------------------------------------------------- Current treatment: Completed 4 cycles ofdose dense Adriamycin and Cytoxan, Today is cycle 7/12 Abraxane Echocardiogram showed an ejection fraction of 55-60%  Chemo Toxicities: 1. Severe fatigue day 4- day 8 continuing to be a problem 2. Leukopenia: I decreased the dosage of cycle 2 of chemotherapy 3. Rash on the leg: improved 4. Canker sores on the right side of the cheek and tongue: Encouraged her to drink more water and use salt water gargles.  She is using biotin. 5. Anemia due to chemotherapy  BRCA2 mutation: Oophorectomy will be delayed after chemotherapy.  Patient is very anxious because her husband has Parkinson's plus disease and requires a lot of assistance at home. With her current chemotherapy she is worried that she could get weaker and not be able to take care of her husband.  Return to clinic in 2 weeks for cycle 9 Abraxane.   No orders of the defined types were placed in this encounter.   The patient has a good understanding of the overall plan. she agrees with it. she will call with any problems that may develop  before the next visit here.   Rulon Eisenmenger, MD 01/17/2016

## 2016-01-22 ENCOUNTER — Encounter: Payer: Self-pay | Admitting: Internal Medicine

## 2016-01-24 ENCOUNTER — Other Ambulatory Visit (HOSPITAL_BASED_OUTPATIENT_CLINIC_OR_DEPARTMENT_OTHER): Payer: Medicare Other

## 2016-01-24 ENCOUNTER — Ambulatory Visit (HOSPITAL_BASED_OUTPATIENT_CLINIC_OR_DEPARTMENT_OTHER): Payer: Medicare Other

## 2016-01-24 VITALS — BP 140/72 | HR 75 | Temp 98.6°F | Resp 18

## 2016-01-24 DIAGNOSIS — Z5111 Encounter for antineoplastic chemotherapy: Secondary | ICD-10-CM

## 2016-01-24 DIAGNOSIS — C50412 Malignant neoplasm of upper-outer quadrant of left female breast: Secondary | ICD-10-CM

## 2016-01-24 LAB — COMPREHENSIVE METABOLIC PANEL WITH GFR
ALT: 17 U/L (ref 0–55)
AST: 18 U/L (ref 5–34)
Albumin: 4 g/dL (ref 3.5–5.0)
Alkaline Phosphatase: 51 U/L (ref 40–150)
Anion Gap: 7 meq/L (ref 3–11)
BUN: 15.1 mg/dL (ref 7.0–26.0)
CO2: 25 meq/L (ref 22–29)
Calcium: 9.4 mg/dL (ref 8.4–10.4)
Chloride: 105 meq/L (ref 98–109)
Creatinine: 0.8 mg/dL (ref 0.6–1.1)
EGFR: 80 ml/min/1.73 m2 — ABNORMAL LOW
Glucose: 91 mg/dL (ref 70–140)
Potassium: 4.2 meq/L (ref 3.5–5.1)
Sodium: 137 meq/L (ref 136–145)
Total Bilirubin: 0.37 mg/dL (ref 0.20–1.20)
Total Protein: 6.7 g/dL (ref 6.4–8.3)

## 2016-01-24 LAB — CBC WITH DIFFERENTIAL/PLATELET
BASO%: 1.7 % (ref 0.0–2.0)
Basophils Absolute: 0.1 10e3/uL (ref 0.0–0.1)
EOS%: 2.5 % (ref 0.0–7.0)
Eosinophils Absolute: 0.1 10e3/uL (ref 0.0–0.5)
HCT: 30.8 % — ABNORMAL LOW (ref 34.8–46.6)
HGB: 10.2 g/dL — ABNORMAL LOW (ref 11.6–15.9)
LYMPH%: 14.3 % (ref 14.0–49.7)
MCH: 31.2 pg (ref 25.1–34.0)
MCHC: 33.1 g/dL (ref 31.5–36.0)
MCV: 94.2 fL (ref 79.5–101.0)
MONO#: 0.4 10e3/uL (ref 0.1–0.9)
MONO%: 9.9 % (ref 0.0–14.0)
NEUT#: 2.9 10e3/uL (ref 1.5–6.5)
NEUT%: 71.6 % (ref 38.4–76.8)
Platelets: 281 10e3/uL (ref 145–400)
RBC: 3.27 10e6/uL — ABNORMAL LOW (ref 3.70–5.45)
RDW: 15.8 % — ABNORMAL HIGH (ref 11.2–14.5)
WBC: 4.1 10e3/uL (ref 3.9–10.3)
lymph#: 0.6 10e3/uL — ABNORMAL LOW (ref 0.9–3.3)

## 2016-01-24 MED ORDER — PROCHLORPERAZINE MALEATE 10 MG PO TABS
10.0000 mg | ORAL_TABLET | Freq: Once | ORAL | Status: AC
  Administered 2016-01-24: 10 mg via ORAL

## 2016-01-24 MED ORDER — PACLITAXEL PROTEIN-BOUND CHEMO INJECTION 100 MG
80.0000 mg/m2 | Freq: Once | INTRAVENOUS | Status: AC
  Administered 2016-01-24: 150 mg via INTRAVENOUS
  Filled 2016-01-24: qty 30

## 2016-01-24 MED ORDER — PROCHLORPERAZINE MALEATE 10 MG PO TABS
ORAL_TABLET | ORAL | Status: AC
  Filled 2016-01-24: qty 1

## 2016-01-24 MED ORDER — SODIUM CHLORIDE 0.9 % IV SOLN
Freq: Once | INTRAVENOUS | Status: AC
  Administered 2016-01-24: 15:00:00 via INTRAVENOUS

## 2016-01-24 MED ORDER — HEPARIN SOD (PORK) LOCK FLUSH 100 UNIT/ML IV SOLN
500.0000 [IU] | Freq: Once | INTRAVENOUS | Status: AC | PRN
  Administered 2016-01-24: 500 [IU]
  Filled 2016-01-24: qty 5

## 2016-01-24 MED ORDER — SODIUM CHLORIDE 0.9% FLUSH
10.0000 mL | INTRAVENOUS | Status: DC | PRN
  Administered 2016-01-24: 10 mL
  Filled 2016-01-24: qty 10

## 2016-01-24 NOTE — Patient Instructions (Signed)
[facility] Discharge Instructions for Patients Receiving Chemotherapy  Today you received the following chemotherapy agents: Abraxane.  To help prevent nausea and vomiting after your treatment, we encourage you to take your nausea medication:  Compazine 10 mg every 6 hours as needed.   If you develop nausea and vomiting that is not controlled by your nausea medication, call the clinic.   BELOW ARE SYMPTOMS THAT SHOULD BE REPORTED IMMEDIATELY:  *FEVER GREATER THAN 100.5 F  *CHILLS WITH OR WITHOUT FEVER  NAUSEA AND VOMITING THAT IS NOT CONTROLLED WITH YOUR NAUSEA MEDICATION  *UNUSUAL SHORTNESS OF BREATH  *UNUSUAL BRUISING OR BLEEDING  TENDERNESS IN MOUTH AND THROAT WITH OR WITHOUT PRESENCE OF ULCERS  *URINARY PROBLEMS  *BOWEL PROBLEMS  UNUSUAL RASH Items with * indicate a potential emergency and should be followed up as soon as possible.  Feel free to call the clinic you have any questions or concerns. The clinic phone number is [phone].  Please show the CHEMO ALERT CARD at check-in to the Emergency Department and triage nurse.   

## 2016-01-31 ENCOUNTER — Encounter: Payer: Self-pay | Admitting: *Deleted

## 2016-01-31 ENCOUNTER — Encounter: Payer: Self-pay | Admitting: Hematology and Oncology

## 2016-01-31 ENCOUNTER — Other Ambulatory Visit: Payer: Self-pay | Admitting: *Deleted

## 2016-01-31 ENCOUNTER — Other Ambulatory Visit (HOSPITAL_BASED_OUTPATIENT_CLINIC_OR_DEPARTMENT_OTHER): Payer: Medicare Other

## 2016-01-31 ENCOUNTER — Ambulatory Visit (HOSPITAL_BASED_OUTPATIENT_CLINIC_OR_DEPARTMENT_OTHER): Payer: Medicare Other

## 2016-01-31 ENCOUNTER — Ambulatory Visit (HOSPITAL_BASED_OUTPATIENT_CLINIC_OR_DEPARTMENT_OTHER): Payer: Medicare Other | Admitting: Hematology and Oncology

## 2016-01-31 VITALS — BP 124/66 | HR 82 | Temp 98.4°F | Resp 18 | Wt 153.3 lb

## 2016-01-31 DIAGNOSIS — C50412 Malignant neoplasm of upper-outer quadrant of left female breast: Secondary | ICD-10-CM

## 2016-01-31 DIAGNOSIS — Z5111 Encounter for antineoplastic chemotherapy: Secondary | ICD-10-CM

## 2016-01-31 LAB — CBC WITH DIFFERENTIAL/PLATELET
BASO%: 2.9 % — AB (ref 0.0–2.0)
BASOS ABS: 0.1 10*3/uL (ref 0.0–0.1)
EOS%: 2.7 % (ref 0.0–7.0)
Eosinophils Absolute: 0.1 10*3/uL (ref 0.0–0.5)
HCT: 33.2 % — ABNORMAL LOW (ref 34.8–46.6)
HEMOGLOBIN: 11.1 g/dL — AB (ref 11.6–15.9)
LYMPH#: 0.6 10*3/uL — AB (ref 0.9–3.3)
LYMPH%: 15.9 % (ref 14.0–49.7)
MCH: 31.8 pg (ref 25.1–34.0)
MCHC: 33.5 g/dL (ref 31.5–36.0)
MCV: 94.8 fL (ref 79.5–101.0)
MONO#: 0.3 10*3/uL (ref 0.1–0.9)
MONO%: 8.1 % (ref 0.0–14.0)
NEUT#: 2.8 10*3/uL (ref 1.5–6.5)
NEUT%: 70.4 % (ref 38.4–76.8)
Platelets: 300 10*3/uL (ref 145–400)
RBC: 3.51 10*6/uL — ABNORMAL LOW (ref 3.70–5.45)
RDW: 16.3 % — AB (ref 11.2–14.5)
WBC: 4 10*3/uL (ref 3.9–10.3)

## 2016-01-31 LAB — COMPREHENSIVE METABOLIC PANEL
ALBUMIN: 4.1 g/dL (ref 3.5–5.0)
ALT: 14 U/L (ref 0–55)
AST: 17 U/L (ref 5–34)
Alkaline Phosphatase: 52 U/L (ref 40–150)
Anion Gap: 8 mEq/L (ref 3–11)
BILIRUBIN TOTAL: 0.47 mg/dL (ref 0.20–1.20)
BUN: 20.7 mg/dL (ref 7.0–26.0)
CHLORIDE: 100 meq/L (ref 98–109)
CO2: 27 mEq/L (ref 22–29)
CREATININE: 0.9 mg/dL (ref 0.6–1.1)
Calcium: 9.7 mg/dL (ref 8.4–10.4)
EGFR: 63 mL/min/{1.73_m2} — ABNORMAL LOW (ref >90)
Glucose: 92 mg/dl (ref 70–140)
POTASSIUM: 4.4 meq/L (ref 3.5–5.1)
SODIUM: 134 meq/L — AB (ref 136–145)
Total Protein: 7 g/dL (ref 6.4–8.3)

## 2016-01-31 MED ORDER — PACLITAXEL PROTEIN-BOUND CHEMO INJECTION 100 MG
80.0000 mg/m2 | Freq: Once | INTRAVENOUS | Status: AC
  Administered 2016-01-31: 150 mg via INTRAVENOUS
  Filled 2016-01-31: qty 30

## 2016-01-31 MED ORDER — PROCHLORPERAZINE MALEATE 10 MG PO TABS
10.0000 mg | ORAL_TABLET | Freq: Once | ORAL | Status: AC
  Administered 2016-01-31: 10 mg via ORAL

## 2016-01-31 MED ORDER — HEPARIN SOD (PORK) LOCK FLUSH 100 UNIT/ML IV SOLN
500.0000 [IU] | Freq: Once | INTRAVENOUS | Status: AC | PRN
  Administered 2016-01-31: 500 [IU]
  Filled 2016-01-31: qty 5

## 2016-01-31 MED ORDER — PROCHLORPERAZINE MALEATE 10 MG PO TABS
ORAL_TABLET | ORAL | Status: AC
  Filled 2016-01-31: qty 1

## 2016-01-31 MED ORDER — SODIUM CHLORIDE 0.9 % IV SOLN
Freq: Once | INTRAVENOUS | Status: AC
  Administered 2016-01-31: 15:00:00 via INTRAVENOUS

## 2016-01-31 MED ORDER — SODIUM CHLORIDE 0.9% FLUSH
10.0000 mL | INTRAVENOUS | Status: DC | PRN
  Administered 2016-01-31: 10 mL
  Filled 2016-01-31: qty 10

## 2016-01-31 NOTE — Patient Instructions (Signed)
[facility] Discharge Instructions for Patients Receiving Chemotherapy  Today you received the following chemotherapy agents: Abraxane.  To help prevent nausea and vomiting after your treatment, we encourage you to take your nausea medication:  Compazine 10 mg every 6 hours as needed.   If you develop nausea and vomiting that is not controlled by your nausea medication, call the clinic.   BELOW ARE SYMPTOMS THAT SHOULD BE REPORTED IMMEDIATELY:  *FEVER GREATER THAN 100.5 F  *CHILLS WITH OR WITHOUT FEVER  NAUSEA AND VOMITING THAT IS NOT CONTROLLED WITH YOUR NAUSEA MEDICATION  *UNUSUAL SHORTNESS OF BREATH  *UNUSUAL BRUISING OR BLEEDING  TENDERNESS IN MOUTH AND THROAT WITH OR WITHOUT PRESENCE OF ULCERS  *URINARY PROBLEMS  *BOWEL PROBLEMS  UNUSUAL RASH Items with * indicate a potential emergency and should be followed up as soon as possible.  Feel free to call the clinic you have any questions or concerns. The clinic phone number is [phone].  Please show the CHEMO ALERT CARD at check-in to the Emergency Department and triage nurse.   

## 2016-01-31 NOTE — Assessment & Plan Note (Signed)
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 Patient does not need axillary lymph node dissection based on current NCCN guidelines and ACOSOG Z 11 clinical trial; luminal type B and high-risk phenotype. Risk of relapse without chemotherapy 24% versus 12% with chemotherapy.  Treatment plan: 1. Dose dense Adriamycin and Cytoxan 4 followed by Abraxane weekly 12 2. Adjuvant radiation therapy followed by 3. Adjuvant antiestrogen therapy ----------------------------------------------------------------------------------------------------------------- Current treatment: Completed 4 cycles ofdose dense Adriamycin and Cytoxan, Today is cycle 9/12 Abraxane Echocardiogram showed an ejection fraction of 55-60%  Chemo Toxicities: 1. Severe fatigue day 4- day 8 continuing to be a problem 2. Leukopenia: I decreased the dosage of cycle 2 of chemotherapy 3. Rash on the leg: improved 4. Canker sores on the right side of the cheek and tongue: Encouraged her to drink more water and use salt water gargles. She is using biotin. 5. Anemia due to chemotherapy  BRCA2 mutation: Oophorectomy will be delayed after chemotherapy.  Return to clinic in 2 weeks for cycle 11 Abraxane.

## 2016-01-31 NOTE — Progress Notes (Signed)
Patient Care Team: Unk Pinto, MD as PCP - General (Internal Medicine) Ralene Bathe, MD as Consulting Physician (Ophthalmology) Serafina Mitchell, MD as Consulting Physician (Vascular Surgery) Carol Ada, MD as Consulting Physician (Gastroenterology) Amy Martinique, MD as Consulting Physician (Dermatology) Nicholas Lose, MD as Consulting Physician (Hematology and Oncology) Excell Seltzer, MD as Consulting Physician (General Surgery)  DIAGNOSIS: No matching staging information was found for the patient.  SUMMARY OF ONCOLOGIC HISTORY:   Breast cancer of upper-outer quadrant of left female breast ([place])   08/03/2015 Mammogram Left Breast: 6 mm mass @ 1 o clock   08/09/2015 Initial Diagnosis Left breast 1:00 position: Invasive ductal carcinoma grade 1-2, ER 100%, PR 10%, HER-2 negative ratio 1.79, Ki-67 15%, T1 BN 0 stage I a clinical stage   08/29/2015 Surgery Left lumpectomy: invasive ductal carcinoma 1.2 cm with LVID, with DCIS intermediate grade, 1 sentinel node positive, ER 100%, PR 10%, HER-2 negative ratio 1.79, Ki-67 15% , margins negative, Mammaprint high risk, luminal B, T1cN1 stage II a   10/11/2015 -  Chemotherapy Adjuvant chemotherapy with dose dense Adriamycin and Cytoxan 4 followed by Abraxane weekly 12    CHIEF COMPLIANT: Cycle 9 Abraxane  INTERVAL HISTORY: Jacqueline Jenkins is a 65 year old above-mentioned history left breast cancer currently on adjuvant chemotherapy with Abraxane. She is tolerating it extremely well. She has excellent taste and appetite. She's good energy levels. She denies any nausea vomiting. Denies any neuropathy.  REVIEW OF SYSTEMS:   Constitutional: Denies fevers, chills or abnormal weight loss Eyes: Denies blurriness of vision Ears, nose, mouth, throat, and face: Denies mucositis or sore throat Respiratory: Denies cough, dyspnea or wheezes Cardiovascular: Denies palpitation, chest discomfort Gastrointestinal:  Denies nausea, heartburn or change  in bowel habits Skin: Denies abnormal skin rashes Lymphatics: Denies new lymphadenopathy or easy bruising Neurological:Denies numbness, tingling or new weaknesses Behavioral/Psych: Mood is stable, no new changes  Extremities: No lower extremity edema Breast:  denies any pain or lumps or nodules in either breasts All other systems were reviewed with the patient and are negative.  I have reviewed the past medical history, past surgical history, social history and family history with the patient and they are unchanged from previous note.  ALLERGIES:  is allergic to lescol; lipitor; and vytorin.  MEDICATIONS:  Current Outpatient Prescriptions  Medication Sig Dispense Refill  . aspirin 81 MG tablet Take 81 mg by mouth daily. Reported on 11/08/2015    . benazepril-hydrochlorthiazide (LOTENSIN HCT) 20-12.5 MG tablet TAKE 1 TABLET BY MOUTH DAILY. 90 tablet 1  . Calcium Carbonate-Vitamin D (CALCIUM + D PO) Take 1 tablet by mouth 2 (two) times daily.    . fluticasone (FLONASE) 50 MCG/ACT nasal spray Place 1 spray into both nostrils as needed for allergies or rhinitis.    Marland Kitchen HYDROcodone-acetaminophen (NORCO/VICODIN) 5-325 MG tablet Take 1-2 tablets by mouth every 4 (four) hours as needed for moderate pain or severe pain. (Patient not taking: Reported on 11/08/2015) 20 tablet 0  . loratadine (CLARITIN) 10 MG tablet Take 10 mg by mouth daily.    . magic mouthwash w/lidocaine SOLN 5-68m  QID prn 240 mL 1  . Multiple Vitamin (MULTIVITAMIN) tablet Take 1 tablet by mouth at bedtime.     . Omega-3 Fatty Acids (FISH OIL) 1200 MG CAPS Take 1 capsule by mouth daily.     .Marland Kitchenomeprazole (PRILOSEC) 10 MG capsule Take 10 mg by mouth daily.     No current facility-administered medications for this visit.   Facility-Administered  Medications Ordered in Other Visits  Medication Dose Route Frequency Provider Last Rate Last Dose  . sodium chloride flush (NS) 0.9 % injection 10 mL  10 mL Intracatheter PRN Nicholas Lose, MD    10 mL at 12/20/15 1605    PHYSICAL EXAMINATION: ECOG PERFORMANCE STATUS: 0 - Asymptomatic  Filed Vitals:   01/31/16 1425  BP: 124/66  Pulse: 82  Temp: 98.4 F (36.9 C)  Resp: 18   Filed Weights   01/31/16 1425  Weight: 153 lb 4.8 oz (69.536 kg)    GENERAL:alert, no distress and comfortable SKIN: skin color, texture, turgor are normal, no rashes or significant lesions EYES: normal, Conjunctiva are pink and non-injected, sclera clear OROPHARYNX:no exudate, no erythema and lips, buccal mucosa, and tongue normal  NECK: supple, thyroid normal size, non-tender, without nodularity LYMPH:  no palpable lymphadenopathy in the cervical, axillary or inguinal LUNGS: clear to auscultation and percussion with normal breathing effort HEART: regular rate & rhythm and no murmurs and no lower extremity edema ABDOMEN:abdomen soft, non-tender and normal bowel sounds MUSCULOSKELETAL:no cyanosis of digits and no clubbing  NEURO: alert & oriented x 3 with fluent speech, no focal motor/sensory deficits EXTREMITIES: No lower extremity edema  LABORATORY DATA:  I have reviewed the data as listed   Chemistry      Component Value Date/Time   NA 134* 01/31/2016 1411   NA 137 10/08/2015 1430   K 4.4 01/31/2016 1411   K 3.9 10/08/2015 1430   CL 101 10/08/2015 1430   CO2 27 01/31/2016 1411   CO2 27 10/08/2015 1430   BUN 20.7 01/31/2016 1411   BUN 16 10/08/2015 1430   CREATININE 0.9 01/31/2016 1411   CREATININE 0.64 10/08/2015 1430   CREATININE 0.62 06/07/2015 1602      Component Value Date/Time   CALCIUM 9.7 01/31/2016 1411   CALCIUM 9.3 10/08/2015 1430   ALKPHOS 52 01/31/2016 1411   ALKPHOS 58 06/07/2015 1602   AST 17 01/31/2016 1411   AST 22 06/07/2015 1602   ALT 14 01/31/2016 1411   ALT 21 06/07/2015 1602   BILITOT 0.47 01/31/2016 1411   BILITOT 0.6 06/07/2015 1602       Lab Results  Component Value Date   WBC 4.0 01/31/2016   HGB 11.1* 01/31/2016   HCT 33.2* 01/31/2016   MCV  94.8 01/31/2016   PLT 300 01/31/2016   NEUTROABS 2.8 01/31/2016     ASSESSMENT & PLAN:  Breast cancer of upper-outer quadrant of left female breast ([place]) Left lumpectomy 08/29/2015: Invasive ductal carcinoma 1.2 cm with LVID, with DCIS intermediate grade, 1 sentinel node positive, ER 100%, PR 10%, HER-2 negative ratio 1.79, Ki-67 15% , margins negative, Mammaprint high risk, luminal B, T1cN1 stage II a. Patient does not need axillary lymph node dissection based on current NCCN guidelines and ACOSOG Z 11 clinical trial; luminal type B and high-risk phenotype. Risk of relapse without chemotherapy 24% versus 12% with chemotherapy.  Treatment plan: 1. Dose dense Adriamycin and Cytoxan 4 followed by Abraxane weekly 12 2. Adjuvant radiation therapy followed by 3. Adjuvant antiestrogen therapy ----------------------------------------------------------------------------------------------------------------- Current treatment: Completed 4 cycles ofdose dense Adriamycin and Cytoxan, Today is cycle 9/12 Abraxane Echocardiogram showed an ejection fraction of 55-60%  Chemo Toxicities: 1. Severe fatigue day 4- day 8 continuing to be a problem 2. Leukopenia: I decreased the dosage of cycle 2 of chemotherapy 3. Rash on the leg: improved 4. Canker sores on the right side of the cheek and tongue: Encouraged her to  drink more water and use salt water gargles. She is using biotin. 5. Anemia due to chemotherapy  BRCA2 mutation: Oophorectomy will be delayed after chemotherapy.  Return to clinic in 3 weeks for cycle 12 Abraxane.    No orders of the defined types were placed in this encounter.   The patient has a good understanding of the overall plan. she agrees with it. she will call with any problems that may develop before the next visit here.   Rulon Eisenmenger, MD 01/31/2016

## 2016-02-07 ENCOUNTER — Other Ambulatory Visit (HOSPITAL_BASED_OUTPATIENT_CLINIC_OR_DEPARTMENT_OTHER): Payer: Medicare Other

## 2016-02-07 ENCOUNTER — Ambulatory Visit (HOSPITAL_BASED_OUTPATIENT_CLINIC_OR_DEPARTMENT_OTHER): Payer: Medicare Other

## 2016-02-07 VITALS — BP 133/80 | HR 75 | Temp 97.0°F | Resp 18

## 2016-02-07 DIAGNOSIS — Z5111 Encounter for antineoplastic chemotherapy: Secondary | ICD-10-CM

## 2016-02-07 DIAGNOSIS — C50412 Malignant neoplasm of upper-outer quadrant of left female breast: Secondary | ICD-10-CM | POA: Diagnosis not present

## 2016-02-07 LAB — CBC WITH DIFFERENTIAL/PLATELET
BASO%: 2.6 % — AB (ref 0.0–2.0)
BASOS ABS: 0.1 10*3/uL (ref 0.0–0.1)
EOS%: 2.4 % (ref 0.0–7.0)
Eosinophils Absolute: 0.1 10*3/uL (ref 0.0–0.5)
HEMATOCRIT: 33.8 % — AB (ref 34.8–46.6)
HEMOGLOBIN: 11.3 g/dL — AB (ref 11.6–15.9)
LYMPH#: 0.5 10*3/uL — AB (ref 0.9–3.3)
LYMPH%: 13.5 % — ABNORMAL LOW (ref 14.0–49.7)
MCH: 31.6 pg (ref 25.1–34.0)
MCHC: 33.3 g/dL (ref 31.5–36.0)
MCV: 94.9 fL (ref 79.5–101.0)
MONO#: 0.3 10*3/uL (ref 0.1–0.9)
MONO%: 7.9 % (ref 0.0–14.0)
NEUT#: 3 10*3/uL (ref 1.5–6.5)
NEUT%: 73.6 % (ref 38.4–76.8)
Platelets: 297 10*3/uL (ref 145–400)
RBC: 3.57 10*6/uL — ABNORMAL LOW (ref 3.70–5.45)
RDW: 15.9 % — AB (ref 11.2–14.5)
WBC: 4 10*3/uL (ref 3.9–10.3)

## 2016-02-07 LAB — COMPREHENSIVE METABOLIC PANEL
ALT: 16 U/L (ref 0–55)
AST: 20 U/L (ref 5–34)
Albumin: 4.2 g/dL (ref 3.5–5.0)
Alkaline Phosphatase: 53 U/L (ref 40–150)
Anion Gap: 7 mEq/L (ref 3–11)
BILIRUBIN TOTAL: 0.36 mg/dL (ref 0.20–1.20)
BUN: 10.1 mg/dL (ref 7.0–26.0)
CO2: 26 meq/L (ref 22–29)
CREATININE: 0.8 mg/dL (ref 0.6–1.1)
Calcium: 9.8 mg/dL (ref 8.4–10.4)
Chloride: 102 mEq/L (ref 98–109)
EGFR: 80 mL/min/{1.73_m2} — AB (ref >90)
GLUCOSE: 88 mg/dL (ref 70–140)
Potassium: 4.2 mEq/L (ref 3.5–5.1)
SODIUM: 136 meq/L (ref 136–145)
TOTAL PROTEIN: 7.1 g/dL (ref 6.4–8.3)

## 2016-02-07 MED ORDER — SODIUM CHLORIDE 0.9% FLUSH
10.0000 mL | INTRAVENOUS | Status: DC | PRN
  Administered 2016-02-07: 10 mL
  Filled 2016-02-07: qty 10

## 2016-02-07 MED ORDER — PROCHLORPERAZINE MALEATE 10 MG PO TABS
ORAL_TABLET | ORAL | Status: AC
  Filled 2016-02-07: qty 1

## 2016-02-07 MED ORDER — SODIUM CHLORIDE 0.9 % IV SOLN
Freq: Once | INTRAVENOUS | Status: AC
  Administered 2016-02-07: 14:00:00 via INTRAVENOUS

## 2016-02-07 MED ORDER — HEPARIN SOD (PORK) LOCK FLUSH 100 UNIT/ML IV SOLN
500.0000 [IU] | Freq: Once | INTRAVENOUS | Status: AC | PRN
  Administered 2016-02-07: 500 [IU]
  Filled 2016-02-07: qty 5

## 2016-02-07 MED ORDER — PROCHLORPERAZINE MALEATE 10 MG PO TABS
10.0000 mg | ORAL_TABLET | Freq: Once | ORAL | Status: AC
  Administered 2016-02-07: 10 mg via ORAL

## 2016-02-07 MED ORDER — PACLITAXEL PROTEIN-BOUND CHEMO INJECTION 100 MG
80.0000 mg/m2 | Freq: Once | INTRAVENOUS | Status: AC
  Administered 2016-02-07: 150 mg via INTRAVENOUS
  Filled 2016-02-07: qty 30

## 2016-02-07 NOTE — Patient Instructions (Signed)
[facility] Discharge Instructions for Patients Receiving Chemotherapy  Today you received the following chemotherapy agents: Abraxane   To help prevent nausea and vomiting after your treatment, we encourage you to take your nausea medication as directed.    If you develop nausea and vomiting that is not controlled by your nausea medication, call the clinic.   BELOW ARE SYMPTOMS THAT SHOULD BE REPORTED IMMEDIATELY:  *FEVER GREATER THAN 100.5 F  *CHILLS WITH OR WITHOUT FEVER  NAUSEA AND VOMITING THAT IS NOT CONTROLLED WITH YOUR NAUSEA MEDICATION  *UNUSUAL SHORTNESS OF BREATH  *UNUSUAL BRUISING OR BLEEDING  TENDERNESS IN MOUTH AND THROAT WITH OR WITHOUT PRESENCE OF ULCERS  *URINARY PROBLEMS  *BOWEL PROBLEMS  UNUSUAL RASH Items with * indicate a potential emergency and should be followed up as soon as possible.  Feel free to call the clinic you have any questions or concerns. The clinic phone number is [phone].  Please show the CHEMO ALERT CARD at check-in to the Emergency Department and triage nurse.   

## 2016-02-14 ENCOUNTER — Ambulatory Visit (HOSPITAL_BASED_OUTPATIENT_CLINIC_OR_DEPARTMENT_OTHER): Payer: Medicare Other

## 2016-02-14 ENCOUNTER — Other Ambulatory Visit (HOSPITAL_BASED_OUTPATIENT_CLINIC_OR_DEPARTMENT_OTHER): Payer: Medicare Other

## 2016-02-14 DIAGNOSIS — Z5111 Encounter for antineoplastic chemotherapy: Secondary | ICD-10-CM | POA: Diagnosis not present

## 2016-02-14 DIAGNOSIS — C50412 Malignant neoplasm of upper-outer quadrant of left female breast: Secondary | ICD-10-CM

## 2016-02-14 LAB — COMPREHENSIVE METABOLIC PANEL
ALT: 15 U/L (ref 0–55)
AST: 19 U/L (ref 5–34)
Albumin: 4.1 g/dL (ref 3.5–5.0)
Alkaline Phosphatase: 50 U/L (ref 40–150)
Anion Gap: 9 mEq/L (ref 3–11)
BUN: 14.6 mg/dL (ref 7.0–26.0)
CO2: 26 meq/L (ref 22–29)
Calcium: 9.6 mg/dL (ref 8.4–10.4)
Chloride: 100 mEq/L (ref 98–109)
Creatinine: 0.8 mg/dL (ref 0.6–1.1)
EGFR: 75 mL/min/{1.73_m2} — AB (ref >90)
GLUCOSE: 108 mg/dL (ref 70–140)
POTASSIUM: 4.4 meq/L (ref 3.5–5.1)
SODIUM: 134 meq/L — AB (ref 136–145)
Total Bilirubin: 0.4 mg/dL (ref 0.20–1.20)
Total Protein: 7.2 g/dL (ref 6.4–8.3)

## 2016-02-14 LAB — CBC WITH DIFFERENTIAL/PLATELET
BASO%: 2.2 % — ABNORMAL HIGH (ref 0.0–2.0)
Basophils Absolute: 0.1 10*3/uL (ref 0.0–0.1)
EOS ABS: 0.1 10*3/uL (ref 0.0–0.5)
EOS%: 2 % (ref 0.0–7.0)
HCT: 33 % — ABNORMAL LOW (ref 34.8–46.6)
HGB: 11 g/dL — ABNORMAL LOW (ref 11.6–15.9)
LYMPH%: 11.3 % — AB (ref 14.0–49.7)
MCH: 31.5 pg (ref 25.1–34.0)
MCHC: 33.5 g/dL (ref 31.5–36.0)
MCV: 94 fL (ref 79.5–101.0)
MONO#: 0.3 10*3/uL (ref 0.1–0.9)
MONO%: 6.6 % (ref 0.0–14.0)
NEUT#: 3.5 10*3/uL (ref 1.5–6.5)
NEUT%: 77.9 % — ABNORMAL HIGH (ref 38.4–76.8)
Platelets: 293 10*3/uL (ref 145–400)
RBC: 3.51 10*6/uL — AB (ref 3.70–5.45)
RDW: 15.6 % — AB (ref 11.2–14.5)
WBC: 4.6 10*3/uL (ref 3.9–10.3)
lymph#: 0.5 10*3/uL — ABNORMAL LOW (ref 0.9–3.3)

## 2016-02-14 MED ORDER — PACLITAXEL PROTEIN-BOUND CHEMO INJECTION 100 MG
80.0000 mg/m2 | Freq: Once | INTRAVENOUS | Status: AC
  Administered 2016-02-14: 150 mg via INTRAVENOUS
  Filled 2016-02-14: qty 30

## 2016-02-14 MED ORDER — PROCHLORPERAZINE MALEATE 10 MG PO TABS
10.0000 mg | ORAL_TABLET | Freq: Once | ORAL | Status: AC
  Administered 2016-02-14: 10 mg via ORAL

## 2016-02-14 MED ORDER — SODIUM CHLORIDE 0.9% FLUSH
10.0000 mL | INTRAVENOUS | Status: DC | PRN
  Administered 2016-02-14: 10 mL
  Filled 2016-02-14: qty 10

## 2016-02-14 MED ORDER — HEPARIN SOD (PORK) LOCK FLUSH 100 UNIT/ML IV SOLN
500.0000 [IU] | Freq: Once | INTRAVENOUS | Status: AC | PRN
  Administered 2016-02-14: 500 [IU]
  Filled 2016-02-14: qty 5

## 2016-02-14 MED ORDER — PROCHLORPERAZINE MALEATE 10 MG PO TABS
ORAL_TABLET | ORAL | Status: AC
  Filled 2016-02-14: qty 1

## 2016-02-14 MED ORDER — SODIUM CHLORIDE 0.9 % IV SOLN
Freq: Once | INTRAVENOUS | Status: AC
  Administered 2016-02-14: 15:00:00 via INTRAVENOUS

## 2016-02-14 NOTE — Patient Instructions (Signed)
[facility] Discharge Instructions for Patients Receiving Chemotherapy  Today you received the following chemotherapy agents: Abraxane   To help prevent nausea and vomiting after your treatment, we encourage you to take your nausea medication as directed.    If you develop nausea and vomiting that is not controlled by your nausea medication, call the clinic.   BELOW ARE SYMPTOMS THAT SHOULD BE REPORTED IMMEDIATELY:  *FEVER GREATER THAN 100.5 F  *CHILLS WITH OR WITHOUT FEVER  NAUSEA AND VOMITING THAT IS NOT CONTROLLED WITH YOUR NAUSEA MEDICATION  *UNUSUAL SHORTNESS OF BREATH  *UNUSUAL BRUISING OR BLEEDING  TENDERNESS IN MOUTH AND THROAT WITH OR WITHOUT PRESENCE OF ULCERS  *URINARY PROBLEMS  *BOWEL PROBLEMS  UNUSUAL RASH Items with * indicate a potential emergency and should be followed up as soon as possible.  Feel free to call the clinic you have any questions or concerns. The clinic phone number is [phone].  Please show the CHEMO ALERT CARD at check-in to the Emergency Department and triage nurse.   

## 2016-02-21 ENCOUNTER — Other Ambulatory Visit (HOSPITAL_BASED_OUTPATIENT_CLINIC_OR_DEPARTMENT_OTHER): Payer: Medicare Other

## 2016-02-21 ENCOUNTER — Ambulatory Visit: Payer: Medicare Other

## 2016-02-21 ENCOUNTER — Encounter: Payer: Self-pay | Admitting: Hematology and Oncology

## 2016-02-21 ENCOUNTER — Encounter: Payer: Self-pay | Admitting: *Deleted

## 2016-02-21 ENCOUNTER — Ambulatory Visit (HOSPITAL_BASED_OUTPATIENT_CLINIC_OR_DEPARTMENT_OTHER): Payer: Medicare Other

## 2016-02-21 ENCOUNTER — Telehealth: Payer: Self-pay | Admitting: Hematology and Oncology

## 2016-02-21 ENCOUNTER — Ambulatory Visit (HOSPITAL_BASED_OUTPATIENT_CLINIC_OR_DEPARTMENT_OTHER): Payer: Medicare Other | Admitting: Hematology and Oncology

## 2016-02-21 VITALS — BP 148/83 | HR 78 | Temp 98.7°F | Resp 18 | Wt 154.5 lb

## 2016-02-21 DIAGNOSIS — C50412 Malignant neoplasm of upper-outer quadrant of left female breast: Secondary | ICD-10-CM | POA: Diagnosis not present

## 2016-02-21 DIAGNOSIS — Z5111 Encounter for antineoplastic chemotherapy: Secondary | ICD-10-CM

## 2016-02-21 DIAGNOSIS — D701 Agranulocytosis secondary to cancer chemotherapy: Secondary | ICD-10-CM | POA: Diagnosis not present

## 2016-02-21 LAB — CBC WITH DIFFERENTIAL/PLATELET
BASO%: 1.9 % (ref 0.0–2.0)
Basophils Absolute: 0.1 10*3/uL (ref 0.0–0.1)
EOS%: 2.9 % (ref 0.0–7.0)
Eosinophils Absolute: 0.1 10*3/uL (ref 0.0–0.5)
HCT: 35.4 % (ref 34.8–46.6)
HGB: 11.6 g/dL (ref 11.6–15.9)
LYMPH#: 0.7 10*3/uL — AB (ref 0.9–3.3)
LYMPH%: 16.6 % (ref 14.0–49.7)
MCH: 30.9 pg (ref 25.1–34.0)
MCHC: 32.8 g/dL (ref 31.5–36.0)
MCV: 94.1 fL (ref 79.5–101.0)
MONO#: 0.4 10*3/uL (ref 0.1–0.9)
MONO%: 8.8 % (ref 0.0–14.0)
NEUT%: 69.8 % (ref 38.4–76.8)
NEUTROS ABS: 2.9 10*3/uL (ref 1.5–6.5)
PLATELETS: 296 10*3/uL (ref 145–400)
RBC: 3.76 10*6/uL (ref 3.70–5.45)
RDW: 15 % — ABNORMAL HIGH (ref 11.2–14.5)
WBC: 4.2 10*3/uL (ref 3.9–10.3)

## 2016-02-21 LAB — COMPREHENSIVE METABOLIC PANEL
ALT: 17 U/L (ref 0–55)
ANION GAP: 8 meq/L (ref 3–11)
AST: 18 U/L (ref 5–34)
Albumin: 4.1 g/dL (ref 3.5–5.0)
Alkaline Phosphatase: 54 U/L (ref 40–150)
BUN: 15.8 mg/dL (ref 7.0–26.0)
CALCIUM: 9.9 mg/dL (ref 8.4–10.4)
CHLORIDE: 102 meq/L (ref 98–109)
CO2: 26 mEq/L (ref 22–29)
Creatinine: 0.8 mg/dL (ref 0.6–1.1)
EGFR: 76 mL/min/{1.73_m2} — ABNORMAL LOW (ref >90)
Glucose: 89 mg/dl (ref 70–140)
POTASSIUM: 4.7 meq/L (ref 3.5–5.1)
Sodium: 137 mEq/L (ref 136–145)
TOTAL PROTEIN: 7.3 g/dL (ref 6.4–8.3)
Total Bilirubin: 0.38 mg/dL (ref 0.20–1.20)

## 2016-02-21 MED ORDER — HEPARIN SOD (PORK) LOCK FLUSH 100 UNIT/ML IV SOLN
500.0000 [IU] | Freq: Once | INTRAVENOUS | Status: AC | PRN
  Administered 2016-02-21: 500 [IU]
  Filled 2016-02-21: qty 5

## 2016-02-21 MED ORDER — PROCHLORPERAZINE MALEATE 10 MG PO TABS
10.0000 mg | ORAL_TABLET | Freq: Once | ORAL | Status: AC
  Administered 2016-02-21: 10 mg via ORAL

## 2016-02-21 MED ORDER — SODIUM CHLORIDE 0.9% FLUSH
10.0000 mL | INTRAVENOUS | Status: DC | PRN
  Administered 2016-02-21: 10 mL
  Filled 2016-02-21: qty 10

## 2016-02-21 MED ORDER — PACLITAXEL PROTEIN-BOUND CHEMO INJECTION 100 MG
80.0000 mg/m2 | Freq: Once | INTRAVENOUS | Status: AC
  Administered 2016-02-21: 150 mg via INTRAVENOUS
  Filled 2016-02-21: qty 30

## 2016-02-21 MED ORDER — PROCHLORPERAZINE MALEATE 10 MG PO TABS
ORAL_TABLET | ORAL | Status: AC
  Filled 2016-02-21: qty 1

## 2016-02-21 MED ORDER — SODIUM CHLORIDE 0.9 % IV SOLN
Freq: Once | INTRAVENOUS | Status: AC
  Administered 2016-02-21: 12:00:00 via INTRAVENOUS

## 2016-02-21 NOTE — Assessment & Plan Note (Signed)
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 Patient does not need axillary lymph node dissection based on current NCCN guidelines and ACOSOG Z 11 clinical trial; luminal type B and high-risk phenotype. Risk of relapse without chemotherapy 24% versus 12% with chemotherapy.  Treatment plan: 1. Dose dense Adriamycin and Cytoxan 4 followed by Abraxane weekly 12 2. Adjuvant radiation therapy followed by 3. Adjuvant antiestrogen therapy ----------------------------------------------------------------------------------------------------------------- Current treatment: Completed 4 cycles ofdose dense Adriamycin and Cytoxan, Today is cycle 12/12 Abraxane Echocardiogram showed an ejection fraction of 55-60%  Chemo Toxicities: 1. Severe fatigue day 4- day 8 continuing to be a problem 2. Leukopenia: I decreased the dosage of cycle 2 of chemotherapy 3. Rash on the leg: improved 4. Canker sores on the right side of the cheek and tongue: Encouraged her to drink more water and use salt water gargles. She is using biotin. 5. Anemia due to chemotherapy  BRCA2 mutation: Oophorectomy will be delayed after chemotherapy.  Return to clinic after radiation therapy is complete to start antiestrogen therapy

## 2016-02-21 NOTE — Progress Notes (Signed)
Patient Care Team: Unk Pinto, MD as PCP - General (Internal Medicine) Ralene Bathe, MD as Consulting Physician (Ophthalmology) Serafina Mitchell, MD as Consulting Physician (Vascular Surgery) Carol Ada, MD as Consulting Physician (Gastroenterology) Amy Martinique, MD as Consulting Physician (Dermatology) Nicholas Lose, MD as Consulting Physician (Hematology and Oncology) Excell Seltzer, MD as Consulting Physician (General Surgery)  DIAGNOSIS: No matching staging information was found for the patient.  SUMMARY OF ONCOLOGIC HISTORY:   Breast cancer of upper-outer quadrant of left female breast ([place])   08/03/2015 Mammogram Left Breast: 6 mm mass @ 1 o clock   08/09/2015 Initial Diagnosis Left breast 1:00 position: Invasive ductal carcinoma grade 1-2, ER 100%, PR 10%, HER-2 negative ratio 1.79, Ki-67 15%, T1 BN 0 stage I a clinical stage   08/29/2015 Surgery Left lumpectomy: invasive ductal carcinoma 1.2 cm with LVID, with DCIS intermediate grade, 1 sentinel node positive, ER 100%, PR 10%, HER-2 negative ratio 1.79, Ki-67 15% , margins negative, Mammaprint high risk, luminal B, T1cN1 stage II a   10/11/2015 -  Chemotherapy Adjuvant chemotherapy with dose dense Adriamycin and Cytoxan 4 followed by Abraxane weekly 12    CHIEF COMPLIANT: Last dose of Abraxane  INTERVAL HISTORY: Jacqueline Jenkins is a 65 year old above-mentioned history left breast cancer treated with lumpectomy and today is her last dose of adjuvant chemotherapy with Abraxane. She tolerated chemotherapy extremely well. She had no neuropathy. Denies any nausea or vomiting. Denies any fevers or chills. She does have moderate fatigue.  REVIEW OF SYSTEMS:   Constitutional: Denies fevers, chills or abnormal weight loss Eyes: Denies blurriness of vision Ears, nose, mouth, throat, and face: Denies mucositis or sore throat Respiratory: Denies cough, dyspnea or wheezes Cardiovascular: Denies palpitation, chest  discomfort Gastrointestinal:  Denies nausea, heartburn or change in bowel habits Skin: Denies abnormal skin rashes Lymphatics: Denies new lymphadenopathy or easy bruising Neurological:Denies numbness, tingling or new weaknesses Behavioral/Psych: Mood is stable, no new changes  Extremities: No lower extremity edema Breast:  denies any pain or lumps or nodules in either breasts All other systems were reviewed with the patient and are negative.  I have reviewed the past medical history, past surgical history, social history and family history with the patient and they are unchanged from previous note.  ALLERGIES:  is allergic to lescol; lipitor; and vytorin.  MEDICATIONS:  Current Outpatient Prescriptions  Medication Sig Dispense Refill  . aspirin 81 MG tablet Take 81 mg by mouth daily. Reported on 11/08/2015    . benazepril-hydrochlorthiazide (LOTENSIN HCT) 20-12.5 MG tablet TAKE 1 TABLET BY MOUTH DAILY. 90 tablet 1  . Calcium Carbonate-Vitamin D (CALCIUM + D PO) Take 1 tablet by mouth 2 (two) times daily.    . fluticasone (FLONASE) 50 MCG/ACT nasal spray Place 1 spray into both nostrils as needed for allergies or rhinitis.    Marland Kitchen HYDROcodone-acetaminophen (NORCO/VICODIN) 5-325 MG tablet Take 1-2 tablets by mouth every 4 (four) hours as needed for moderate pain or severe pain. (Patient not taking: Reported on 11/08/2015) 20 tablet 0  . loratadine (CLARITIN) 10 MG tablet Take 10 mg by mouth daily.    . magic mouthwash w/lidocaine SOLN 5-74m  QID prn 240 mL 1  . Multiple Vitamin (MULTIVITAMIN) tablet Take 1 tablet by mouth at bedtime.     . Omega-3 Fatty Acids (FISH OIL) 1200 MG CAPS Take 1 capsule by mouth daily.     .Marland Kitchenomeprazole (PRILOSEC) 10 MG capsule Take 10 mg by mouth daily.     No  current facility-administered medications for this visit.   Facility-Administered Medications Ordered in Other Visits  Medication Dose Route Frequency Provider Last Rate Last Dose  . sodium chloride flush  (NS) 0.9 % injection 10 mL  10 mL Intracatheter PRN Nicholas Lose, MD   10 mL at 12/20/15 1605    PHYSICAL EXAMINATION: ECOG PERFORMANCE STATUS: 1 - Symptomatic but completely ambulatory  Filed Vitals:   02/21/16 1056  BP: 148/83  Pulse: 78  Temp: 98.7 F (37.1 C)  Resp: 18   Filed Weights   02/21/16 1056  Weight: 154 lb 8 oz (70.081 kg)    GENERAL:alert, no distress and comfortable SKIN: skin color, texture, turgor are normal, no rashes or significant lesions EYES: normal, Conjunctiva are pink and non-injected, sclera clear OROPHARYNX:no exudate, no erythema and lips, buccal mucosa, and tongue normal  NECK: supple, thyroid normal size, non-tender, without nodularity LYMPH:  no palpable lymphadenopathy in the cervical, axillary or inguinal LUNGS: clear to auscultation and percussion with normal breathing effort HEART: regular rate & rhythm and no murmurs and no lower extremity edema ABDOMEN:abdomen soft, non-tender and normal bowel sounds MUSCULOSKELETAL:no cyanosis of digits and no clubbing  NEURO: alert & oriented x 3 with fluent speech, no focal motor/sensory deficits EXTREMITIES: No lower extremity edema  LABORATORY DATA:  I have reviewed the data as listed   Chemistry      Component Value Date/Time   NA 137 02/21/2016 1037   NA 137 10/08/2015 1430   K 4.7 02/21/2016 1037   K 3.9 10/08/2015 1430   CL 101 10/08/2015 1430   CO2 26 02/21/2016 1037   CO2 27 10/08/2015 1430   BUN 15.8 02/21/2016 1037   BUN 16 10/08/2015 1430   CREATININE 0.8 02/21/2016 1037   CREATININE 0.64 10/08/2015 1430   CREATININE 0.62 06/07/2015 1602      Component Value Date/Time   CALCIUM 9.9 02/21/2016 1037   CALCIUM 9.3 10/08/2015 1430   ALKPHOS 54 02/21/2016 1037   ALKPHOS 58 06/07/2015 1602   AST 18 02/21/2016 1037   AST 22 06/07/2015 1602   ALT 17 02/21/2016 1037   ALT 21 06/07/2015 1602   BILITOT 0.38 02/21/2016 1037   BILITOT 0.6 06/07/2015 1602       Lab Results   Component Value Date   WBC 4.2 02/21/2016   HGB 11.6 02/21/2016   HCT 35.4 02/21/2016   MCV 94.1 02/21/2016   PLT 296 02/21/2016   NEUTROABS 2.9 02/21/2016     ASSESSMENT & PLAN:  Breast cancer of upper-outer quadrant of left female breast ([place]) Left lumpectomy 08/29/2015: Invasive ductal carcinoma 1.2 cm with LVID, with DCIS intermediate grade, 1 sentinel node positive, ER 100%, PR 10%, HER-2 negative ratio 1.79, Ki-67 15% , margins negative, Mammaprint high risk, luminal B, T1cN1 stage II a. Patient does not need axillary lymph node dissection based on current NCCN guidelines and ACOSOG Z 11 clinical trial; luminal type B and high-risk phenotype. Risk of relapse without chemotherapy 24% versus 12% with chemotherapy.  Treatment plan: 1. Dose dense Adriamycin and Cytoxan 4 followed by Abraxane weekly 12 2. Adjuvant radiation therapy followed by 3. Adjuvant antiestrogen therapy ----------------------------------------------------------------------------------------------------------------- Current treatment: Completed 4 cycles ofdose dense Adriamycin and Cytoxan, Today is cycle 12/12 Abraxane Echocardiogram showed an ejection fraction of 55-60%  Chemo Toxicities: 1. Severe fatigue day 4- day 8 continuing to be a problem 2. Leukopenia: I decreased the dosage of cycle 2 of chemotherapy 3. Rash on the leg: improved 4. Canker sores  on the right side of the cheek and tongue: Encouraged her to drink more water and use salt water gargles. She is using biotin. 5. Anemia due to chemotherapy: Resolved  BRCA2 mutation: Oophorectomy will be done in a month  Return to clinic after radiation therapy is complete to start antiestrogen therapy in 3 months   No orders of the defined types were placed in this encounter.   The patient has a good understanding of the overall plan. she agrees with it. she will call with any problems that may develop before the next visit here.   Rulon Eisenmenger,  MD 02/21/2016

## 2016-02-21 NOTE — Telephone Encounter (Signed)
appt made and avs printed °

## 2016-02-21 NOTE — Patient Instructions (Signed)
[facility] Discharge Instructions for Patients Receiving Chemotherapy  Today you received the following chemotherapy agents Abraxane To help prevent nausea and vomiting after your treatment, we encourage you to take your nausea medication as prescribed.   If you develop nausea and vomiting that is not controlled by your nausea medication, call the clinic.   BELOW ARE SYMPTOMS THAT SHOULD BE REPORTED IMMEDIATELY:  *FEVER GREATER THAN 100.5 F  *CHILLS WITH OR WITHOUT FEVER  NAUSEA AND VOMITING THAT IS NOT CONTROLLED WITH YOUR NAUSEA MEDICATION  *UNUSUAL SHORTNESS OF BREATH  *UNUSUAL BRUISING OR BLEEDING  TENDERNESS IN MOUTH AND THROAT WITH OR WITHOUT PRESENCE OF ULCERS  *URINARY PROBLEMS  *BOWEL PROBLEMS  UNUSUAL RASH Items with * indicate a potential emergency and should be followed up as soon as possible.  Feel free to call the clinic you have any questions or concerns. The clinic phone number is [phone].  Please show the CHEMO ALERT CARD at check-in to the Emergency Department and triage nurse.   

## 2016-03-03 ENCOUNTER — Ambulatory Visit
Admission: RE | Admit: 2016-03-03 | Discharge: 2016-03-03 | Disposition: A | Discharge status: Home / Self Care | Payer: Medicare Other | Source: Ambulatory Visit | Attending: Radiation Oncology | Admitting: Radiation Oncology

## 2016-03-03 ENCOUNTER — Encounter: Payer: Self-pay | Admitting: Radiation Oncology

## 2016-03-03 VITALS — BP 120/60 | HR 88 | Temp 98.4°F | Resp 18 | Ht 66.0 in | Wt 157.6 lb

## 2016-03-03 DIAGNOSIS — Z9889 Other specified postprocedural states: Secondary | ICD-10-CM | POA: Diagnosis not present

## 2016-03-03 DIAGNOSIS — E785 Hyperlipidemia, unspecified: Secondary | ICD-10-CM | POA: Diagnosis not present

## 2016-03-03 DIAGNOSIS — Z170 Estrogen receptor positive status [ER+]: Secondary | ICD-10-CM | POA: Insufficient documentation

## 2016-03-03 DIAGNOSIS — C50412 Malignant neoplasm of upper-outer quadrant of left female breast: Secondary | ICD-10-CM | POA: Insufficient documentation

## 2016-03-03 DIAGNOSIS — Z510 Encounter for antineoplastic radiation therapy: Secondary | ICD-10-CM | POA: Insufficient documentation

## 2016-03-03 DIAGNOSIS — M8580 Other specified disorders of bone density and structure, unspecified site: Secondary | ICD-10-CM | POA: Diagnosis not present

## 2016-03-03 DIAGNOSIS — I728 Aneurysm of other specified arteries: Secondary | ICD-10-CM | POA: Insufficient documentation

## 2016-03-03 DIAGNOSIS — I10 Essential (primary) hypertension: Secondary | ICD-10-CM | POA: Diagnosis not present

## 2016-03-03 HISTORY — DX: Genetic susceptibility to malignant neoplasm of breast: Z15.09

## 2016-03-03 HISTORY — DX: Malignant neoplasm of upper-outer quadrant of left female breast: C50.412

## 2016-03-03 HISTORY — DX: Genetic susceptibility to malignant neoplasm of breast: Z15.01

## 2016-03-03 NOTE — Progress Notes (Addendum)
Location of Breast Cancer: Left Breast Upper Outer Quadrant  Histology per Pathology Report: 08/09/2015: Left breast,needle core biopsy=Invasive Ductal carcinoma, Grade 1-2  Receptor Status: ER(100%+), PR (100%+), Her2-neu (neg ratio 1.79), Ki-(15%)  Did patient present with symptoms (if so, please note symptoms) or was this found on screening mammography?:ROUTINE SCREENING    Past/Anticipated interventions by surgeon, if any:  Dr. Marland Kitchen [place],MD:  1/06/217: 1.  Breast excision,re-excision,left anterior margin,benign breast tissue,negative for atypia malignancy,surgical margin,negative for atypia malignancy 2.Breast excision,rexecision left posterior margin,atypical ductal hyperplasia,susrgival margin,neg for atypia and malignancy 3.Breast excision,right ear ache-excision ,left medial margin,benign breast tissue,neg for atypia and malignancy,surgical margin,neg for atypia and maligmancy   08/29/2015: Left Breast Lumpectomy,invasiveand in situ ductal carcinoma,1.2cm,invasive carcinoma,02cm from medial margin,DCIS less than 0.1cm from anterior,posterior and medial margins.Lymph node,sentinel bx,left axillary,Metastatic carcinoma in 1 lymph node(1/1)  Past/Anticipated interventions by medical oncology, if any: Chemotherapy : Dr. Loleta Dicker [place],MD;02/01/16 note:10/11/2015 Chemotherapy adjuvant chemotherapy with dose dense Adriamycin ND CYTOXAN X4 FOLLOWED BY ABRAXANE WEEKLY X 2 LAST DOSE OF ABRAXANE 12 OF 12  COMPLETED  02/21/16 , BRCA2 mutation: Oophorectomy to be done 1 month, Follow up after radiation  Then to start antiestrogen therapy in 3 months  Lymphedema issues, if any: No    Pain issues, if any:  No  SAFETY ISSUES: No  Prior radiation? NO  Pacemaker/ICD? NO  Possible current pregnancy? NO  Is the patient on methotrexate?  No  Current Complaints / other details: Married, 2 children, menses age 35, age 81st pregnancy age 35, , no HRT, birth control pills for 3 years,   no tobacco  use, rare alcohol use,no drug use, menopause splenic  Aneurysm 6/11/13visceral angiogram Dr. Trula Slade   Maternal grandmother breast cancer, Mother,Stroke,HTN,DVT,Father HTN, \ BP 120/60 mmHg  Pulse 88  Temp(Src) 98.4 F (36.9 C) (Oral)  Resp 18  Ht 5' 6"  (1.676 m)  Wt 157 lb 9.6 oz (71.487 kg)  BMI 25.45 kg/m2  SpO2 100%  Wt Readings from Last 3 Encounters:  03/03/16 157 lb 9.6 oz (71.487 kg)  02/21/16 154 lb 8 oz (70.081 kg)  01/31/16 153 lb 4.8 oz (69.536 kg)   Allergies: lescol,lipitor,vytorin     Rebecca Eaton, RN 03/03/2016,7:35 AM

## 2016-03-03 NOTE — Progress Notes (Signed)
Radiation Oncology         (336) [phone] ________________________________  Name: Jacqueline Jenkins MRN: [id number]  Date: 03/03/2016  DOB: [birth year]  HY:IFOYDXA,JOINOMV DAVID, MD  Unk Pinto, MD     REFERRING PHYSICIAN: Unk Pinto, MD  DIAGNOSIS: Stage IIa T1 N1 M0 invasive ductal carcinoma with ductal carcinoma in situ, ER positive, PR positive, Her2-neu negative of the left breast  HISTORY OF PRESENT ILLNESS::Jacqueline Jenkins is a 65 y.o. female who is seen for an initial consultation visit. She had a mammogram in November 2016 that showed an abnormality in the left breast. She had a diagnostic mammogram and ultrasound 08/03/2015, revealing a suspicious 6 mm mass in the upper inner left breast at the 1 o'clock position approximately 1 CFN with no pathologic left axillary lymphadenopathy. Ultrasound showed a vague, hypoechoic antiparallel mass at the 1 o'clock position approximately 1 CFN with irregular margins measuring approximately 4 x 5 x 4 mm. She had a biopsy 08/09/2015, revealing invasive ductal carcinoma, grade 1-2, ER (100%), PR (100%), Her2-neu negative, Ki 67 (15%). She had a left breast lumpectomy 08/28/2016, revealing invasive and in situ ductal carcinoma. Lymph node sentinel biopsy of the left axilla showed metastatic carcinoma in 1/1 lymph nodes. She had a left breast excision and re-excision 09/07/2015 per Dr. Excell Seltzer that was negative for atypia or malignancy. She started chemotherapy with Dr. Lindi Adie in February 2017 including Adriamycin and cytoxan x4 followed by Abraxane weekly x12, completed Abraxane 02/21/2016. She has tested positive for BRCA2. She is here today to discuss radiation treatment options with Dr. Lisbeth Renshaw.   PREVIOUS RADIATION THERAPY: No   PAST MEDICAL HISTORY: Past Medical History  Diagnosis Date  . Hypertension   . Hyperlipidemia   . DDD (degenerative disc disease)   . Vitamin D deficiency   . Allergic rhinitis, cause unspecified   . Osteopenia 12/2011   Spine T -0.4, Femur -2.1  . Splenic artery aneurysm ([place]) 2013    COILS DONE  . Cancer (HCC)     LEFT  . PONV (postoperative nausea and vomiting)     RECENT SURGERY NO PONV     PAST SURGICAL HISTORY: Past Surgical History  Procedure Laterality Date  . Tonsillectomy    . Spine surgery  1998    cervical diskectomy  . Spine surgery  2001, 2010    Lumbar diskectomy  . Cervical disc surgery  1998  . Lumbar disc surgery  2001, 2010  . Splenic aneurysm  02/10/2012    Aneurysm of splenic artery  . Colonoscopy  Jan. 7, 2014  . Visceral angiogram N/A 02/10/2012    Procedure: VISCERAL ANGIOGRAM;  Surgeon: Serafina Mitchell, MD;  Location: [facility] CATH LAB;  Service: Cardiovascular;  Laterality: N/A;  . Breast lumpectomy with radioactive seed and sentinel lymph node biopsy Left 08/29/2015    Procedure: LEFT BREAST LUMPECTOMY WITH RADIOACTIVE SEED WITH AXILLARY SENTINEL LYMPH NODE BIOPSY;  Surgeon: Excell Seltzer, MD;  Location: [place];  Service: General;  Laterality: Left;  . Re-excision of breast lumpectomy Left 09/07/2015    Procedure: RE-EXCISION OF LEFT BREAST LUMPECTOMY;  Surgeon: Excell Seltzer, MD;  Location: [place];  Service: General;  Laterality: Left;  . Portacath placement Bilateral 10/09/2015    Procedure: INSERTION PORT-A-CATH;  Surgeon: Excell Seltzer, MD;  Location: [facility];  Service: General;  Laterality: Bilateral;     FAMILY HISTORY:  Family History  Problem Relation Age of Onset  . Stroke Mother   . Osteoporosis  Mother   . Hyperlipidemia Mother   . Hypertension Mother   . Other Mother     varicose veins  . Deep vein thrombosis Mother   . Diverticulitis Mother   . Breast cancer Maternal Grandmother     dx. 30s-40s; when pt's mother was 90 years old  . Other Father     bleeding problems  . Hypertension Father   . Heart attack Father   . Stroke Father   . Other Sister     one sister had a hysterectomy for fibroids  . Leukemia  Maternal Aunt     dx. 50s-60s  . Pancreatic cancer Maternal Uncle     dx. late 60s-early 70s; (x2 maternal uncles)  . Heart attack Paternal Aunt   . Stroke Paternal Aunt   . Stroke Maternal Grandfather   . Heart attack Paternal Grandmother   . Stroke Paternal Grandmother   . Heart Problems Paternal Grandfather   . Colon cancer Maternal Uncle     dx. late 40s  . Heart defect Maternal Aunt     possible  . Breast cancer Cousin     dx. 50s-60s; maternal first  . Heart attack Paternal Uncle   . Stroke Paternal Uncle   . Lung cancer Maternal Uncle     treated at [facility]; d. 53s     SOCIAL HISTORY:  reports that she has never smoked. She has never used smokeless tobacco. She reports that she drinks alcohol. She reports that she does not use illicit drugs.   ALLERGIES: Lescol; Lipitor; and Vytorin   MEDICATIONS:  Current Outpatient Prescriptions  Medication Sig Dispense Refill  . benazepril-hydrochlorthiazide (LOTENSIN HCT) 20-12.5 MG tablet TAKE 1 TABLET BY MOUTH DAILY. 90 tablet 1  . aspirin 81 MG tablet Take 81 mg by mouth daily. Reported on 03/03/2016    . Calcium Carbonate-Vitamin D (CALCIUM + D PO) Take 1 tablet by mouth 2 (two) times daily. Reported on 03/03/2016    . fluticasone (FLONASE) 50 MCG/ACT nasal spray Place 1 spray into both nostrils as needed for allergies or rhinitis. Reported on 03/03/2016    . HYDROcodone-acetaminophen (NORCO/VICODIN) 5-325 MG tablet Take 1-2 tablets by mouth every 4 (four) hours as needed for moderate pain or severe pain. (Patient not taking: Reported on 11/08/2015) 20 tablet 0  . loratadine (CLARITIN) 10 MG tablet Take 10 mg by mouth daily. Reported on 03/03/2016    . magic mouthwash w/lidocaine SOLN 5-81m  QID prn (Patient not taking: Reported on 03/03/2016) 240 mL 1  . Multiple Vitamin (MULTIVITAMIN) tablet Take 1 tablet by mouth at bedtime. Reported on 03/03/2016    . Omega-3 Fatty Acids (FISH OIL) 1200 MG CAPS Take 1 capsule by mouth daily. Reported on  03/03/2016    . omeprazole (PRILOSEC) 10 MG capsule Take 10 mg by mouth daily. Reported on 03/03/2016     No current facility-administered medications for this encounter.   Facility-Administered Medications Ordered in Other Encounters  Medication Dose Route Frequency Provider Last Rate Last Dose  . sodium chloride flush (NS) 0.9 % injection 10 mL  10 mL Intracatheter PRN VNicholas Lose MD   10 mL at 12/20/15 1605     REVIEW OF SYSTEMS:  On review of systems, the patient reports that she is doing well overall. She denies any chest pain, shortness of breath, cough, fevers, chills, night sweats, unintended weight changes. She denies any bowel or bladder disturbances, and denies abdominal pain, nausea or vomiting. She denies any new musculoskeletal or  joint aches or pains. She denies any lymphedema. A complete review of systems is obtained and is otherwise negative.  PHYSICAL EXAM:  height is 5' 6"  (1.676 m) and weight is 157 lb 9.6 oz (71.487 kg). Her oral temperature is 98.4 F (36.9 C). Her blood pressure is 120/60 and her pulse is 88. Her respiration is 18 and oxygen saturation is 100%.    In general this is a well appearing caucasian woman in no acute distress. She is alert and oriented x4 and appropriate throughout the examination. HEENT reveals that the patient is normocephalic, atraumatic. EOMs are intact. PERRLA. Skin is intact without any evidence of gross lesions. Cardiovascular exam reveals a regular rate and rhythm, no clicks rubs or murmurs are auscultated. Chest is clear to auscultation bilaterally. Lymphatic assessment is performed and does not reveal any adenopathy in the cervical, supraclavicular, axillary, or inguinal chains. Abdomen has active bowel sounds in all quadrants and is intact. The abdomen is soft, non tender, non distended. Lower extremities are negative for pretibial pitting edema, deep calf tenderness, cyanosis or clubbing. She has a lumpetcomy scar around near on her left  breast that is well healed. No palpable tumor in the left breast, nipple discharge, or bleeding. Right breast has no palpable mass, nipple discharge, or bleeding.   ECOG = 1  0 - Asymptomatic (Fully active, able to carry on all predisease activities without restriction)  1 - Symptomatic but completely ambulatory (Restricted in physically strenuous activity but ambulatory and able to carry out work of a light or sedentary nature. For example, light housework, office work)  2 - Symptomatic, <50% in bed during the day (Ambulatory and capable of all self care but unable to carry out any work activities. Up and about more than 50% of waking hours)  3 - Symptomatic, >50% in bed, but not bedbound (Capable of only limited self-care, confined to bed or chair 50% or more of waking hours)  4 - Bedbound (Completely disabled. Cannot carry on any self-care. Totally confined to bed or chair)  5 - Death   Eustace Pen MM, Creech RH, Tormey DC, et al. [phone]). "Toxicity and response criteria of the [facility]". [employer] Oncol. 5 (6): 649-55     LABORATORY DATA:  Lab Results  Component Value Date   WBC 4.2 02/21/2016   HGB 11.6 02/21/2016   HCT 35.4 02/21/2016   MCV 94.1 02/21/2016   PLT 296 02/21/2016   Lab Results  Component Value Date   NA 137 02/21/2016   K 4.7 02/21/2016   CL 101 10/08/2015   CO2 26 02/21/2016   Lab Results  Component Value Date   ALT 17 02/21/2016   AST 18 02/21/2016   ALKPHOS 54 02/21/2016   BILITOT 0.38 02/21/2016      RADIOGRAPHY: No results found.     IMPRESSION: Jacqueline Jenkins is a 65 yo woman with Stage II a T1 N1 M0 invasive ductal carcinoma with ductal carcinoma in situ, ER positive, PR positive, Her2-neu negative. She is a good candidate for external radiation treatment and antiestrogen treatment following radiation.   PLAN: Dr. Lisbeth Renshaw spoke with the patient today regarding her diagnosis and options for treatment. We discussed the role of  radiation in decreasing local failures in patients who undergo lumpectomy. We discussed the process of simulation and the placement tattoos. We discussed 4 weeks of treatment as an outpatient. We discussed the possibility of asymptomatic lung damage. We discussed the low likelihood of secondary  malignancies. We discussed the possible side effects including but not limited to skin redness, fatigue, permanent skin darkening, and breast swelling. She will have her CT simulation appointment scheduled around 03/17/2016 and start treatment the following week. This procedure has been fully reviewed with the patient and written informed consent has been obtained. She is considering an oophorectomy but has not scheduled this. We discussed that it would be beneficial to wait until after radiation to have surgery.    The above documentation reflects my direct findings during this shared patient visit. Please see the separate note by Dr. Lisbeth Renshaw on this date for the remainder of the patient's plan of care.    Carola Rhine, PAC  This document serves as a record of services personally performed by Dr. Lisbeth Renshaw, MD and Shona Simpson, [facility]. It was created on their behalf by Lendon Collar, a trained medical scribe. The creation of this record is based on the scribe's personal observations and the provider's statements to them. This document has been checked and approved by the attending provider.

## 2016-03-03 NOTE — Addendum Note (Signed)
Encounter addended by: Kyung Rudd, MD on: 03/03/2016  4:41 PM<BR>     Documentation filed: Problem List

## 2016-03-03 NOTE — Progress Notes (Signed)
Please see the Nurse Progress Note in the MD Initial Consult Encounter for this patient. 

## 2016-03-10 DIAGNOSIS — Z853 Personal history of malignant neoplasm of breast: Secondary | ICD-10-CM | POA: Diagnosis not present

## 2016-03-10 DIAGNOSIS — Z452 Encounter for adjustment and management of vascular access device: Secondary | ICD-10-CM | POA: Diagnosis not present

## 2016-03-17 ENCOUNTER — Ambulatory Visit: Payer: Medicare Other | Admitting: Radiation Oncology

## 2016-03-18 ENCOUNTER — Ambulatory Visit: Payer: Medicare Other | Admitting: Radiation Oncology

## 2016-03-19 ENCOUNTER — Ambulatory Visit
Admission: RE | Admit: 2016-03-19 | Discharge: 2016-03-19 | Disposition: A | Discharge status: Home / Self Care | Payer: Medicare Other | Source: Ambulatory Visit | Attending: Radiation Oncology | Admitting: Radiation Oncology

## 2016-03-19 DIAGNOSIS — E785 Hyperlipidemia, unspecified: Secondary | ICD-10-CM | POA: Diagnosis not present

## 2016-03-19 DIAGNOSIS — C50412 Malignant neoplasm of upper-outer quadrant of left female breast: Secondary | ICD-10-CM

## 2016-03-19 DIAGNOSIS — I10 Essential (primary) hypertension: Secondary | ICD-10-CM | POA: Diagnosis not present

## 2016-03-19 DIAGNOSIS — M8580 Other specified disorders of bone density and structure, unspecified site: Secondary | ICD-10-CM | POA: Diagnosis not present

## 2016-03-19 DIAGNOSIS — Z510 Encounter for antineoplastic radiation therapy: Secondary | ICD-10-CM | POA: Diagnosis not present

## 2016-03-19 DIAGNOSIS — Z170 Estrogen receptor positive status [ER+]: Secondary | ICD-10-CM | POA: Diagnosis not present

## 2016-03-20 ENCOUNTER — Encounter: Payer: Self-pay | Admitting: Surgery

## 2016-03-20 DIAGNOSIS — C50412 Malignant neoplasm of upper-outer quadrant of left female breast: Secondary | ICD-10-CM | POA: Diagnosis not present

## 2016-03-21 DIAGNOSIS — Z170 Estrogen receptor positive status [ER+]: Secondary | ICD-10-CM | POA: Diagnosis not present

## 2016-03-21 DIAGNOSIS — Z510 Encounter for antineoplastic radiation therapy: Secondary | ICD-10-CM | POA: Diagnosis not present

## 2016-03-21 DIAGNOSIS — E785 Hyperlipidemia, unspecified: Secondary | ICD-10-CM | POA: Diagnosis not present

## 2016-03-21 DIAGNOSIS — M8580 Other specified disorders of bone density and structure, unspecified site: Secondary | ICD-10-CM | POA: Diagnosis not present

## 2016-03-21 DIAGNOSIS — C50412 Malignant neoplasm of upper-outer quadrant of left female breast: Secondary | ICD-10-CM | POA: Diagnosis not present

## 2016-03-21 DIAGNOSIS — I10 Essential (primary) hypertension: Secondary | ICD-10-CM | POA: Diagnosis not present

## 2016-03-26 ENCOUNTER — Ambulatory Visit
Admission: RE | Admit: 2016-03-26 | Discharge: 2016-03-26 | Disposition: A | Discharge status: Home / Self Care | Payer: Medicare Other | Source: Ambulatory Visit | Attending: Radiation Oncology | Admitting: Radiation Oncology

## 2016-03-26 DIAGNOSIS — E785 Hyperlipidemia, unspecified: Secondary | ICD-10-CM | POA: Diagnosis not present

## 2016-03-26 DIAGNOSIS — M8580 Other specified disorders of bone density and structure, unspecified site: Secondary | ICD-10-CM | POA: Diagnosis not present

## 2016-03-26 DIAGNOSIS — C50412 Malignant neoplasm of upper-outer quadrant of left female breast: Secondary | ICD-10-CM | POA: Diagnosis not present

## 2016-03-26 DIAGNOSIS — Z170 Estrogen receptor positive status [ER+]: Secondary | ICD-10-CM | POA: Diagnosis not present

## 2016-03-26 DIAGNOSIS — I10 Essential (primary) hypertension: Secondary | ICD-10-CM | POA: Diagnosis not present

## 2016-03-26 DIAGNOSIS — Z510 Encounter for antineoplastic radiation therapy: Secondary | ICD-10-CM | POA: Diagnosis not present

## 2016-03-27 ENCOUNTER — Ambulatory Visit
Admission: RE | Admit: 2016-03-27 | Discharge: 2016-03-27 | Disposition: A | Discharge status: Home / Self Care | Payer: Medicare Other | Source: Ambulatory Visit | Attending: Radiation Oncology | Admitting: Radiation Oncology

## 2016-03-27 DIAGNOSIS — E785 Hyperlipidemia, unspecified: Secondary | ICD-10-CM | POA: Diagnosis not present

## 2016-03-27 DIAGNOSIS — Z510 Encounter for antineoplastic radiation therapy: Secondary | ICD-10-CM | POA: Diagnosis not present

## 2016-03-27 DIAGNOSIS — C50412 Malignant neoplasm of upper-outer quadrant of left female breast: Secondary | ICD-10-CM | POA: Diagnosis not present

## 2016-03-27 DIAGNOSIS — I10 Essential (primary) hypertension: Secondary | ICD-10-CM | POA: Diagnosis not present

## 2016-03-27 DIAGNOSIS — M8580 Other specified disorders of bone density and structure, unspecified site: Secondary | ICD-10-CM | POA: Diagnosis not present

## 2016-03-27 DIAGNOSIS — Z170 Estrogen receptor positive status [ER+]: Secondary | ICD-10-CM | POA: Diagnosis not present

## 2016-03-27 MED ORDER — ALRA NON-METALLIC DEODORANT (RAD-ONC)
1.0000 "application " | Freq: Once | TOPICAL | Status: AC
  Administered 2016-03-27: 1 via TOPICAL

## 2016-03-27 MED ORDER — RADIAPLEXRX EX GEL
Freq: Once | CUTANEOUS | Status: AC
Start: 2016-03-27 — End: 2016-03-27
  Administered 2016-03-27: 10:00:00 via TOPICAL

## 2016-03-27 NOTE — Progress Notes (Signed)
Pt education done, radiation therapy and you book, my business card, skin products=radiaplex gel and alra deodorant given , discussed ways to manage side effects, fatigue,pain, skin irritation, swelling/soreness breast, use  Only electric shaver if shaving, increase diet  Of protein, eat healthier, drink plenty water./fluids, stay hydrated, use skin productas after rad tx and bedtime,verbal understanding, teach back given 9:56 AM

## 2016-03-28 ENCOUNTER — Ambulatory Visit
Admission: RE | Admit: 2016-03-28 | Discharge: 2016-03-28 | Disposition: A | Discharge status: Home / Self Care | Payer: Medicare Other | Source: Ambulatory Visit | Attending: Radiation Oncology | Admitting: Radiation Oncology

## 2016-03-28 ENCOUNTER — Encounter: Payer: Self-pay | Admitting: Radiation Oncology

## 2016-03-28 VITALS — BP 123/67 | HR 68 | Temp 98.8°F | Ht 66.0 in | Wt 155.3 lb

## 2016-03-28 DIAGNOSIS — Z510 Encounter for antineoplastic radiation therapy: Secondary | ICD-10-CM | POA: Diagnosis not present

## 2016-03-28 DIAGNOSIS — E785 Hyperlipidemia, unspecified: Secondary | ICD-10-CM | POA: Diagnosis not present

## 2016-03-28 DIAGNOSIS — I10 Essential (primary) hypertension: Secondary | ICD-10-CM | POA: Diagnosis not present

## 2016-03-28 DIAGNOSIS — C50412 Malignant neoplasm of upper-outer quadrant of left female breast: Secondary | ICD-10-CM | POA: Diagnosis not present

## 2016-03-28 DIAGNOSIS — Z170 Estrogen receptor positive status [ER+]: Secondary | ICD-10-CM | POA: Diagnosis not present

## 2016-03-28 DIAGNOSIS — M8580 Other specified disorders of bone density and structure, unspecified site: Secondary | ICD-10-CM | POA: Diagnosis not present

## 2016-03-28 NOTE — Progress Notes (Signed)
Jacqueline Jenkins has completed 2 fractions to her left breast.  She denies having pain and fatigue.  She started using radiaplex yesterday.  The skin on her left breast is intact.  BP 123/67 (BP Location: Right Arm, Patient Position: Sitting)   Pulse 68   Temp 98.8 F (37.1 C) (Oral)   Ht 5\' 6"  (1.676 m)   Wt 155 lb 4.8 oz (70.4 kg)   SpO2 98%   BMI 25.07 kg/m    Wt Readings from Last 3 Encounters:  03/28/16 155 lb 4.8 oz (70.4 kg)  03/03/16 157 lb 9.6 oz (71.5 kg)  02/21/16 154 lb 8 oz (70.1 kg)

## 2016-03-28 NOTE — Progress Notes (Signed)
Department of Radiation Oncology  Phone:  [phone] Fax:        [phone]  Weekly Treatment Note    Name: Jacqueline Jenkins Date: 03/28/2016 MRN: [id number] DOB: August 28, 1951   Diagnosis:   Stage IIa T1 N1 M0 invasive ductal carcinoma with ductal carcinoma in situ, ER positive, PR positive, Her2-neu negative of the left breast   Current dose: 5 Gy  Current fraction: 2   MEDICATIONS: Current Outpatient Prescriptions  Medication Sig Dispense Refill  . benazepril-hydrochlorthiazide (LOTENSIN HCT) 20-12.5 MG tablet TAKE 1 TABLET BY MOUTH DAILY. 90 tablet 1  . hyaluronate sodium (RADIAPLEXRX) GEL Apply 1 application topically 2 (two) times daily.    . non-metallic deodorant [name]) MISC Apply 1 application topically daily as needed.    Marland Kitchen aspirin 81 MG tablet Take 81 mg by mouth daily. Reported on 03/03/2016    . Calcium Carbonate-Vitamin D (CALCIUM + D PO) Take 1 tablet by mouth 2 (two) times daily. Reported on 03/03/2016    . fluticasone (FLONASE) 50 MCG/ACT nasal spray Place 1 spray into both nostrils as needed for allergies or rhinitis. Reported on 03/03/2016    . HYDROcodone-acetaminophen (NORCO/VICODIN) 5-325 MG tablet Take 1-2 tablets by mouth every 4 (four) hours as needed for moderate pain or severe pain. (Patient not taking: Reported on 11/08/2015) 20 tablet 0  . loratadine (CLARITIN) 10 MG tablet Take 10 mg by mouth daily. Reported on 03/03/2016    . magic mouthwash w/lidocaine SOLN 5-22m  QID prn (Patient not taking: Reported on 03/03/2016) 240 mL 1  . Multiple Vitamin (MULTIVITAMIN) tablet Take 1 tablet by mouth at bedtime. Reported on 03/03/2016    . Omega-3 Fatty Acids (FISH OIL) 1200 MG CAPS Take 1 capsule by mouth daily. Reported on 03/03/2016    . omeprazole (PRILOSEC) 10 MG capsule Take 10 mg by mouth daily. Reported on 03/03/2016     No current facility-administered medications for this encounter.    Facility-Administered Medications Ordered in Other Encounters  Medication Dose  Route Frequency Provider Last Rate Last Dose  . sodium chloride flush (NS) 0.9 % injection 10 mL  10 mL Intracatheter PRN VNicholas Lose MD   10 mL at 12/20/15 1605     ALLERGIES: Lescol [fluvastatin sodium]; Lipitor [atorvastatin]; and Vytorin [ezetimibe-simvastatin]   LABORATORY DATA:  Lab Results  Component Value Date   WBC 4.2 02/21/2016   HGB 11.6 02/21/2016   HCT 35.4 02/21/2016   MCV 94.1 02/21/2016   PLT 296 02/21/2016   Lab Results  Component Value Date   NA 137 02/21/2016   K 4.7 02/21/2016   CL 101 10/08/2015   CO2 26 02/21/2016   Lab Results  Component Value Date   ALT 17 02/21/2016   AST 18 02/21/2016   ALKPHOS 54 02/21/2016   BILITOT 0.38 02/21/2016     NARRATIVE: Jacqueline STEINBACHwas seen today for weekly treatment management. The chart was checked and the patient's films were reviewed. Jacqueline Denzlerhas completed 2 fractions to her left breast.  She denies having pain and fatigue.  She started using radiaplex yesterday.  The skin on her left breast is intact.     PHYSICAL EXAMINATION: height is _0  (1.676 m) and weight is 155 lb 4.8 oz (70.4 kg). Her oral temperature is 98.8 F (37.1 C). Her blood pressure is 123/67 and her pulse is 68. Her oxygen saturation is 98%.       ASSESSMENT: The patient is doing satisfactorily with treatment.  PLAN: We will continue with the patient's radiation treatment as planned.      This document serves as a record of services personally performed by Tyler Pita, MD. It was created on his behalf by Truddie Hidden, a trained medical scribe. The creation of this record is based on the scribe's personal observations and the provider's statements to them. This document has been checked and approved by the attending provider.

## 2016-03-31 ENCOUNTER — Ambulatory Visit: Payer: Medicare Other | Admitting: Surgery

## 2016-03-31 ENCOUNTER — Ambulatory Visit
Admission: RE | Admit: 2016-03-31 | Discharge: 2016-03-31 | Disposition: A | Discharge status: Home / Self Care | Payer: Medicare Other | Source: Ambulatory Visit | Attending: Radiation Oncology | Admitting: Radiation Oncology

## 2016-03-31 DIAGNOSIS — Z510 Encounter for antineoplastic radiation therapy: Secondary | ICD-10-CM | POA: Diagnosis not present

## 2016-03-31 DIAGNOSIS — E785 Hyperlipidemia, unspecified: Secondary | ICD-10-CM | POA: Diagnosis not present

## 2016-03-31 DIAGNOSIS — Z170 Estrogen receptor positive status [ER+]: Secondary | ICD-10-CM | POA: Diagnosis not present

## 2016-03-31 DIAGNOSIS — I10 Essential (primary) hypertension: Secondary | ICD-10-CM | POA: Diagnosis not present

## 2016-03-31 DIAGNOSIS — C50412 Malignant neoplasm of upper-outer quadrant of left female breast: Secondary | ICD-10-CM | POA: Diagnosis not present

## 2016-03-31 DIAGNOSIS — M8580 Other specified disorders of bone density and structure, unspecified site: Secondary | ICD-10-CM | POA: Diagnosis not present

## 2016-04-01 ENCOUNTER — Ambulatory Visit
Admission: RE | Admit: 2016-04-01 | Discharge: 2016-04-01 | Disposition: A | Discharge status: Home / Self Care | Payer: Medicare Other | Source: Ambulatory Visit | Attending: Radiation Oncology | Admitting: Radiation Oncology

## 2016-04-01 DIAGNOSIS — Z510 Encounter for antineoplastic radiation therapy: Secondary | ICD-10-CM | POA: Diagnosis not present

## 2016-04-01 DIAGNOSIS — Z170 Estrogen receptor positive status [ER+]: Secondary | ICD-10-CM | POA: Diagnosis not present

## 2016-04-01 DIAGNOSIS — E785 Hyperlipidemia, unspecified: Secondary | ICD-10-CM | POA: Diagnosis not present

## 2016-04-01 DIAGNOSIS — I10 Essential (primary) hypertension: Secondary | ICD-10-CM | POA: Diagnosis not present

## 2016-04-01 DIAGNOSIS — M8580 Other specified disorders of bone density and structure, unspecified site: Secondary | ICD-10-CM | POA: Diagnosis not present

## 2016-04-01 DIAGNOSIS — C50412 Malignant neoplasm of upper-outer quadrant of left female breast: Secondary | ICD-10-CM | POA: Diagnosis not present

## 2016-04-02 ENCOUNTER — Ambulatory Visit
Admission: RE | Admit: 2016-04-02 | Discharge: 2016-04-02 | Disposition: A | Discharge status: Home / Self Care | Payer: Medicare Other | Source: Ambulatory Visit | Attending: Radiation Oncology | Admitting: Radiation Oncology

## 2016-04-02 DIAGNOSIS — Z510 Encounter for antineoplastic radiation therapy: Secondary | ICD-10-CM | POA: Diagnosis not present

## 2016-04-02 DIAGNOSIS — M8580 Other specified disorders of bone density and structure, unspecified site: Secondary | ICD-10-CM | POA: Diagnosis not present

## 2016-04-02 DIAGNOSIS — I10 Essential (primary) hypertension: Secondary | ICD-10-CM | POA: Diagnosis not present

## 2016-04-02 DIAGNOSIS — E785 Hyperlipidemia, unspecified: Secondary | ICD-10-CM | POA: Diagnosis not present

## 2016-04-02 DIAGNOSIS — Z170 Estrogen receptor positive status [ER+]: Secondary | ICD-10-CM | POA: Diagnosis not present

## 2016-04-02 DIAGNOSIS — C50412 Malignant neoplasm of upper-outer quadrant of left female breast: Secondary | ICD-10-CM | POA: Diagnosis not present

## 2016-04-03 ENCOUNTER — Ambulatory Visit
Admission: RE | Admit: 2016-04-03 | Discharge: 2016-04-03 | Disposition: A | Discharge status: Home / Self Care | Payer: Medicare Other | Source: Ambulatory Visit | Attending: Radiation Oncology | Admitting: Radiation Oncology

## 2016-04-03 ENCOUNTER — Telehealth: Payer: Self-pay | Admitting: *Deleted

## 2016-04-03 DIAGNOSIS — I10 Essential (primary) hypertension: Secondary | ICD-10-CM | POA: Diagnosis not present

## 2016-04-03 DIAGNOSIS — C50412 Malignant neoplasm of upper-outer quadrant of left female breast: Secondary | ICD-10-CM | POA: Diagnosis not present

## 2016-04-03 DIAGNOSIS — Z510 Encounter for antineoplastic radiation therapy: Secondary | ICD-10-CM | POA: Diagnosis not present

## 2016-04-03 DIAGNOSIS — M8580 Other specified disorders of bone density and structure, unspecified site: Secondary | ICD-10-CM | POA: Diagnosis not present

## 2016-04-03 DIAGNOSIS — E785 Hyperlipidemia, unspecified: Secondary | ICD-10-CM | POA: Diagnosis not present

## 2016-04-03 DIAGNOSIS — Z170 Estrogen receptor positive status [ER+]: Secondary | ICD-10-CM | POA: Diagnosis not present

## 2016-04-03 NOTE — Telephone Encounter (Signed)
  Oncology Nurse Navigator Documentation  Navigator Location: [facility]-Med Onc (04/03/16 1200) Navigator Encounter Type: Treatment (04/03/16 1200)           Patient Visit Type: RadOnc (04/03/16 1200) Treatment Phase: First Radiation Tx (04/03/16 1200)                            Time Spent with Patient: 15 (04/03/16 1200)

## 2016-04-04 ENCOUNTER — Ambulatory Visit
Admission: RE | Admit: 2016-04-04 | Discharge: 2016-04-04 | Disposition: A | Discharge status: Home / Self Care | Payer: Medicare Other | Source: Ambulatory Visit | Attending: Radiation Oncology | Admitting: Radiation Oncology

## 2016-04-04 ENCOUNTER — Encounter: Payer: Self-pay | Admitting: Radiation Oncology

## 2016-04-04 VITALS — BP 128/63 | HR 75 | Temp 98.7°F | Resp 16 | Wt 158.2 lb

## 2016-04-04 DIAGNOSIS — I10 Essential (primary) hypertension: Secondary | ICD-10-CM | POA: Diagnosis not present

## 2016-04-04 DIAGNOSIS — Z170 Estrogen receptor positive status [ER+]: Secondary | ICD-10-CM | POA: Diagnosis not present

## 2016-04-04 DIAGNOSIS — M8580 Other specified disorders of bone density and structure, unspecified site: Secondary | ICD-10-CM | POA: Diagnosis not present

## 2016-04-04 DIAGNOSIS — E785 Hyperlipidemia, unspecified: Secondary | ICD-10-CM | POA: Diagnosis not present

## 2016-04-04 DIAGNOSIS — C50412 Malignant neoplasm of upper-outer quadrant of left female breast: Secondary | ICD-10-CM

## 2016-04-04 DIAGNOSIS — Z510 Encounter for antineoplastic radiation therapy: Secondary | ICD-10-CM | POA: Diagnosis not present

## 2016-04-04 NOTE — Progress Notes (Signed)
  Radiation Oncology         [phone]) [phone] ________________________________  Name: Jacqueline Jenkins MRN: [id number]  Date: 03/19/2016  DOB: [birth year]  Optical Surface Tracking Plan:  Since intensity modulated radiotherapy (IMRT) and 3D conformal radiation treatment methods are predicated on accurate and precise positioning for treatment, intrafraction motion monitoring is medically necessary to ensure accurate and safe treatment delivery.  The ability to quantify intrafraction motion without excessive ionizing radiation dose can only be performed with optical surface tracking. Accordingly, surface imaging offers the opportunity to obtain 3D measurements of patient position throughout IMRT and 3D treatments without excessive radiation exposure.  I am ordering optical surface tracking for this patient's upcoming course of radiotherapy. ________________________________  Kyung Rudd, MD 04/04/2016 3:39 PM    Reference:   Particia Jasper, et al. Surface imaging-based analysis of intrafraction motion for breast radiotherapy patients.Journal of [place], n. 6, nov. 2014. ISSN [id number].   Available at: <http://www.jacmp.org/index.php/jacmp/article/view/4957>.

## 2016-04-04 NOTE — Progress Notes (Signed)
  Radiation Oncology         (336) [phone] ________________________________  Name: Jacqueline Jenkins MRN: [id number]  Date: 03/19/2016  DOB: [birth year]   DIAGNOSIS:     ICD-9-CM ICD-10-CM   1. Breast cancer of upper-outer quadrant of left female breast ([place]) 174.4 C50.412     SIMULATION AND TREATMENT PLANNING NOTE  The patient presented for simulation prior to beginning her course of radiation treatment for her diagnosis of  left-sided breast cancer. The patient was placed in a supine position on a breast board. A customized vac-lock bag was constructed and this complex treatment device will be used on a daily basis during her treatment. In this fashion, a CT scan was obtained through the chest area and an isocenter was placed near the chest wall within the breast.  The patient will be planned to receive a course of radiation initially to a dose of 42.5 Gy. This will consist of a whole breast radiotherapy technique. To accomplish this, 2 customized blocks have been designed which will correspond to medial and lateral whole breast tangent fields. This treatment will be accomplished at 2.5 Gy per fraction. A forward planning technique will also be evaluated to determine if this approach improves the plan. It is anticipated that the patient will then receive a 7.5 Gy boost to the seroma cavity which has been contoured. This will be accomplished at 2.5 Gy per fraction.   This initial treatment will consist of a 3-D conformal technique. The seroma has been contoured as the primary target structure. Additionally, dose volume histograms of both this target as well as the lungs and heart will also be evaluated. Such an approach is necessary to ensure that the target area is adequately covered while the nearby critical normal structures are adequately spared.  Plan:  The final anticipated total dose therefore will correspond to 50 Gy.    _______________________________   Jodelle Gross, MD, PhD

## 2016-04-04 NOTE — Progress Notes (Signed)
Department of Radiation Oncology  Phone:  [phone] Fax:        [phone]  Weekly Treatment Note    Name: Jacqueline Jenkins Date: 04/04/2016 MRN: [id number] DOB: 01/31/1951   Diagnosis:     ICD-9-CM ICD-10-CM   1. Breast cancer of upper-outer quadrant of left female breast ([place]) 174.4 C50.412      Current dose: 17.5 Gy  Current fraction:7   MEDICATIONS: Current Outpatient Prescriptions  Medication Sig Dispense Refill  . benazepril-hydrochlorthiazide (LOTENSIN HCT) 20-12.5 MG tablet TAKE 1 TABLET BY MOUTH DAILY. 90 tablet 1  . fluticasone (FLONASE) 50 MCG/ACT nasal spray Place 1 spray into both nostrils as needed for allergies or rhinitis. Reported on 03/03/2016    . loratadine (CLARITIN) 10 MG tablet Take 10 mg by mouth daily. Reported on 03/03/2016    . Multiple Vitamin (MULTIVITAMIN) tablet Take 1 tablet by mouth at bedtime. Reported on 0000000    . non-metallic deodorant [name]) MISC Apply 1 application topically daily as needed.    Marland Kitchen omeprazole (PRILOSEC) 10 MG capsule Take 10 mg by mouth daily. Reported on 03/03/2016    . aspirin 81 MG tablet Take 81 mg by mouth daily. Reported on 03/03/2016    . Calcium Carbonate-Vitamin D (CALCIUM + D PO) Take 1 tablet by mouth 2 (two) times daily. Reported on 03/03/2016    . hyaluronate sodium (RADIAPLEXRX) GEL Apply 1 application topically 2 (two) times daily.    . magic mouthwash w/lidocaine SOLN 5-75ml  QID prn (Patient not taking: Reported on 03/03/2016) 240 mL 1  . Omega-3 Fatty Acids (FISH OIL) 1200 MG CAPS Take 1 capsule by mouth daily. Reported on 03/03/2016     No current facility-administered medications for this encounter.    Facility-Administered Medications Ordered in Other Encounters  Medication Dose Route Frequency Provider Last Rate Last Dose  . sodium chloride flush (NS) 0.9 % injection 10 mL  10 mL Intracatheter PRN Nicholas Lose, MD   10 mL at 12/20/15 1605     ALLERGIES: Lescol [fluvastatin sodium]; Lipitor  [atorvastatin]; and Vytorin [ezetimibe-simvastatin]   LABORATORY DATA:  Lab Results  Component Value Date   WBC 4.2 02/21/2016   HGB 11.6 02/21/2016   HCT 35.4 02/21/2016   MCV 94.1 02/21/2016   PLT 296 02/21/2016   Lab Results  Component Value Date   NA 137 02/21/2016   K 4.7 02/21/2016   CL 101 10/08/2015   CO2 26 02/21/2016   Lab Results  Component Value Date   ALT 17 02/21/2016   AST 18 02/21/2016   ALKPHOS 54 02/21/2016   BILITOT 0.38 02/21/2016     NARRATIVE: KHADIJA Jenkins was seen today for weekly treatment management. The chart was checked and the patient's films were reviewed.  Weekly rad txs left breast 7/20 completed, mild pink skin near the nipple area ,skin intct, using radaiplex bid, appetite good, no c/o 3:20 PM BP 128/63 (BP Location: Right Arm, Patient Position: Sitting, Cuff Size: Normal)   Pulse 75   Temp 98.7 F (37.1 C) (Oral)   Resp 16   Wt 158 lb 3.2 oz (71.8 kg)   BMI 25.53 kg/m   PHYSICAL EXAMINATION: weight is 158 lb 3.2 oz (71.8 kg). Her oral temperature is 98.7 F (37.1 C). Her blood pressure is 128/63 and her pulse is 75. Her respiration is 16.        ASSESSMENT: The patient is doing satisfactorily with treatment.  PLAN: We will continue with the patient's radiation  treatment as planned.

## 2016-04-04 NOTE — Progress Notes (Signed)
Weekly rad txs left breast 7/20 completed, mild pink skin near the nipple area ,skin intct, using radaiplex bid, appetite good, no c/o 2:35 PM BP 128/63 (BP Location: Right Arm, Patient Position: Sitting, Cuff Size: Normal)   Pulse 75   Temp 98.7 F (37.1 C) (Oral)   Resp 16   Wt 158 lb 3.2 oz (71.8 kg)   BMI 25.53 kg/m

## 2016-04-07 ENCOUNTER — Ambulatory Visit
Admission: RE | Admit: 2016-04-07 | Discharge: 2016-04-07 | Disposition: A | Discharge status: Home / Self Care | Payer: Medicare Other | Source: Ambulatory Visit | Attending: Radiation Oncology | Admitting: Radiation Oncology

## 2016-04-07 DIAGNOSIS — M8580 Other specified disorders of bone density and structure, unspecified site: Secondary | ICD-10-CM | POA: Diagnosis not present

## 2016-04-07 DIAGNOSIS — Z170 Estrogen receptor positive status [ER+]: Secondary | ICD-10-CM | POA: Diagnosis not present

## 2016-04-07 DIAGNOSIS — C50412 Malignant neoplasm of upper-outer quadrant of left female breast: Secondary | ICD-10-CM | POA: Diagnosis not present

## 2016-04-07 DIAGNOSIS — E785 Hyperlipidemia, unspecified: Secondary | ICD-10-CM | POA: Diagnosis not present

## 2016-04-07 DIAGNOSIS — I10 Essential (primary) hypertension: Secondary | ICD-10-CM | POA: Diagnosis not present

## 2016-04-07 DIAGNOSIS — Z510 Encounter for antineoplastic radiation therapy: Secondary | ICD-10-CM | POA: Diagnosis not present

## 2016-04-08 ENCOUNTER — Ambulatory Visit
Admission: RE | Admit: 2016-04-08 | Discharge: 2016-04-08 | Disposition: A | Discharge status: Home / Self Care | Payer: Medicare Other | Source: Ambulatory Visit | Attending: Radiation Oncology | Admitting: Radiation Oncology

## 2016-04-08 DIAGNOSIS — E785 Hyperlipidemia, unspecified: Secondary | ICD-10-CM | POA: Diagnosis not present

## 2016-04-08 DIAGNOSIS — M8580 Other specified disorders of bone density and structure, unspecified site: Secondary | ICD-10-CM | POA: Diagnosis not present

## 2016-04-08 DIAGNOSIS — C50412 Malignant neoplasm of upper-outer quadrant of left female breast: Secondary | ICD-10-CM | POA: Diagnosis not present

## 2016-04-08 DIAGNOSIS — Z510 Encounter for antineoplastic radiation therapy: Secondary | ICD-10-CM | POA: Diagnosis not present

## 2016-04-08 DIAGNOSIS — Z170 Estrogen receptor positive status [ER+]: Secondary | ICD-10-CM | POA: Diagnosis not present

## 2016-04-08 DIAGNOSIS — I10 Essential (primary) hypertension: Secondary | ICD-10-CM | POA: Diagnosis not present

## 2016-04-09 ENCOUNTER — Ambulatory Visit
Admission: RE | Admit: 2016-04-09 | Discharge: 2016-04-09 | Disposition: A | Discharge status: Home / Self Care | Payer: Medicare Other | Source: Ambulatory Visit | Attending: Radiation Oncology | Admitting: Radiation Oncology

## 2016-04-09 DIAGNOSIS — I10 Essential (primary) hypertension: Secondary | ICD-10-CM | POA: Diagnosis not present

## 2016-04-09 DIAGNOSIS — Z170 Estrogen receptor positive status [ER+]: Secondary | ICD-10-CM | POA: Diagnosis not present

## 2016-04-09 DIAGNOSIS — M8580 Other specified disorders of bone density and structure, unspecified site: Secondary | ICD-10-CM | POA: Diagnosis not present

## 2016-04-09 DIAGNOSIS — E785 Hyperlipidemia, unspecified: Secondary | ICD-10-CM | POA: Diagnosis not present

## 2016-04-09 DIAGNOSIS — C50412 Malignant neoplasm of upper-outer quadrant of left female breast: Secondary | ICD-10-CM | POA: Diagnosis not present

## 2016-04-09 DIAGNOSIS — Z510 Encounter for antineoplastic radiation therapy: Secondary | ICD-10-CM | POA: Diagnosis not present

## 2016-04-10 ENCOUNTER — Encounter: Payer: Self-pay | Admitting: Radiation Oncology

## 2016-04-10 ENCOUNTER — Ambulatory Visit
Admission: RE | Admit: 2016-04-10 | Discharge: 2016-04-10 | Disposition: A | Discharge status: Home / Self Care | Payer: Medicare Other | Source: Ambulatory Visit | Attending: Radiation Oncology | Admitting: Radiation Oncology

## 2016-04-10 DIAGNOSIS — I10 Essential (primary) hypertension: Secondary | ICD-10-CM | POA: Diagnosis not present

## 2016-04-10 DIAGNOSIS — C50412 Malignant neoplasm of upper-outer quadrant of left female breast: Secondary | ICD-10-CM | POA: Diagnosis not present

## 2016-04-10 DIAGNOSIS — Z510 Encounter for antineoplastic radiation therapy: Secondary | ICD-10-CM | POA: Diagnosis not present

## 2016-04-10 DIAGNOSIS — E785 Hyperlipidemia, unspecified: Secondary | ICD-10-CM | POA: Diagnosis not present

## 2016-04-10 DIAGNOSIS — Z170 Estrogen receptor positive status [ER+]: Secondary | ICD-10-CM | POA: Diagnosis not present

## 2016-04-10 DIAGNOSIS — M8580 Other specified disorders of bone density and structure, unspecified site: Secondary | ICD-10-CM | POA: Diagnosis not present

## 2016-04-11 ENCOUNTER — Ambulatory Visit
Admission: RE | Admit: 2016-04-11 | Discharge: 2016-04-11 | Disposition: A | Discharge status: Home / Self Care | Payer: Medicare Other | Source: Ambulatory Visit | Attending: Radiation Oncology | Admitting: Radiation Oncology

## 2016-04-11 ENCOUNTER — Encounter: Payer: Self-pay | Admitting: Radiation Oncology

## 2016-04-11 VITALS — BP 127/69 | HR 82 | Temp 98.9°F | Resp 12 | Wt 155.8 lb

## 2016-04-11 DIAGNOSIS — E785 Hyperlipidemia, unspecified: Secondary | ICD-10-CM | POA: Diagnosis not present

## 2016-04-11 DIAGNOSIS — Z510 Encounter for antineoplastic radiation therapy: Secondary | ICD-10-CM | POA: Diagnosis not present

## 2016-04-11 DIAGNOSIS — Z170 Estrogen receptor positive status [ER+]: Secondary | ICD-10-CM | POA: Diagnosis not present

## 2016-04-11 DIAGNOSIS — M8580 Other specified disorders of bone density and structure, unspecified site: Secondary | ICD-10-CM | POA: Diagnosis not present

## 2016-04-11 DIAGNOSIS — C50412 Malignant neoplasm of upper-outer quadrant of left female breast: Secondary | ICD-10-CM | POA: Diagnosis not present

## 2016-04-11 DIAGNOSIS — I10 Essential (primary) hypertension: Secondary | ICD-10-CM | POA: Diagnosis not present

## 2016-04-11 NOTE — Progress Notes (Signed)
Department of Radiation Oncology  Phone:  [phone] Fax:        [phone]  Weekly Treatment Note    Name: Jacqueline Jenkins Date: 04/13/2016 MRN: [id number] DOB: May 04, 1951   Diagnosis:     ICD-9-CM ICD-10-CM   1. Breast cancer of upper-outer quadrant of left female breast ([place]) 174.4 C50.412      Current dose: 30 Gy  Current fraction: 12   MEDICATIONS: Current Outpatient Prescriptions  Medication Sig Dispense Refill  . aspirin 81 MG tablet Take 81 mg by mouth daily. Reported on 03/03/2016    . benazepril-hydrochlorthiazide (LOTENSIN HCT) 20-12.5 MG tablet TAKE 1 TABLET BY MOUTH DAILY. 90 tablet 1  . Calcium Carbonate-Vitamin D (CALCIUM + D PO) Take 1 tablet by mouth 2 (two) times daily. Reported on 03/03/2016    . fluticasone (FLONASE) 50 MCG/ACT nasal spray Place 1 spray into both nostrils as needed for allergies or rhinitis. Reported on 03/03/2016    . hyaluronate sodium (RADIAPLEXRX) GEL Apply 1 application topically 2 (two) times daily.    Marland Kitchen loratadine (CLARITIN) 10 MG tablet Take 10 mg by mouth daily. Reported on 03/03/2016    . magic mouthwash w/lidocaine SOLN 5-38ml  QID prn (Patient not taking: Reported on 03/03/2016) 240 mL 1  . Multiple Vitamin (MULTIVITAMIN) tablet Take 1 tablet by mouth at bedtime. Reported on 0000000    . non-metallic deodorant [name]) MISC Apply 1 application topically daily as needed.    . Omega-3 Fatty Acids (FISH OIL) 1200 MG CAPS Take 1 capsule by mouth daily. Reported on 03/03/2016    . omeprazole (PRILOSEC) 10 MG capsule Take 10 mg by mouth daily. Reported on 03/03/2016     No current facility-administered medications for this encounter.    Facility-Administered Medications Ordered in Other Encounters  Medication Dose Route Frequency Provider Last Rate Last Dose  . sodium chloride flush (NS) 0.9 % injection 10 mL  10 mL Intracatheter PRN Nicholas Lose, MD   10 mL at 12/20/15 1605     ALLERGIES: Lescol [fluvastatin sodium]; Lipitor  [atorvastatin]; and Vytorin [ezetimibe-simvastatin]   LABORATORY DATA:  Lab Results  Component Value Date   WBC 4.2 02/21/2016   HGB 11.6 02/21/2016   HCT 35.4 02/21/2016   MCV 94.1 02/21/2016   PLT 296 02/21/2016   Lab Results  Component Value Date   NA 137 02/21/2016   K 4.7 02/21/2016   CL 101 10/08/2015   CO2 26 02/21/2016   Lab Results  Component Value Date   ALT 17 02/21/2016   AST 18 02/21/2016   ALKPHOS 54 02/21/2016   BILITOT 0.38 02/21/2016     NARRATIVE: Jacqueline Jenkins was seen today for weekly treatment management. The chart was checked and the patient's films were reviewed.  She is currently in no pain. Reports mild breast tenderness. Denies edema. Using Radiaplex as directed. Reports fatigue. She reports she is tolerating her radiation treatment well.   PHYSICAL EXAMINATION: weight is 155 lb 12.8 oz (70.7 kg). Her oral temperature is 98.9 F (37.2 C). Her blood pressure is 127/69 and her pulse is 82. Her respiration is 12 and oxygen saturation is 100%.      Minimal erythema / hyperpigmentation in the treatment area.  ASSESSMENT: The patient is doing satisfactorily with treatment.  PLAN: We will continue with the patient's radiation treatment as planned.     This document serves as a record of services personally performed by Kyung Rudd, MD. It was created on his behalf  by Arlyce Harman, a trained medical scribe. The creation of this record is based on the scribe's personal observations and the provider's statements to them. This document has been checked and approved by the attending provider.  ------------------------------------------------  Jodelle Gross, MD, PhD

## 2016-04-11 NOTE — Progress Notes (Signed)
PAIN: She is currently in no pain.  SKIN: Pt left breast- positive for breast tenderness.  Pt denies edema.  Pt continues to apply Radiaplex as directed. OTHER: Pt complains of fatigue. She reports she is tolerating her radiation treatment well.   BP 127/69   Pulse 82   Temp 98.9 F (37.2 C) (Oral)   Resp 12   Wt 155 lb 12.8 oz (70.7 kg)   SpO2 100%   BMI 25.15 kg/m  Wt Readings from Last 3 Encounters:  04/11/16 155 lb 12.8 oz (70.7 kg)  04/04/16 158 lb 3.2 oz (71.8 kg)  03/28/16 155 lb 4.8 oz (70.4 kg)

## 2016-04-14 ENCOUNTER — Ambulatory Visit
Admission: RE | Admit: 2016-04-14 | Discharge: 2016-04-14 | Disposition: A | Discharge status: Home / Self Care | Payer: Medicare Other | Source: Ambulatory Visit | Attending: Radiation Oncology | Admitting: Radiation Oncology

## 2016-04-14 DIAGNOSIS — M8580 Other specified disorders of bone density and structure, unspecified site: Secondary | ICD-10-CM | POA: Diagnosis not present

## 2016-04-14 DIAGNOSIS — C50412 Malignant neoplasm of upper-outer quadrant of left female breast: Secondary | ICD-10-CM | POA: Diagnosis not present

## 2016-04-14 DIAGNOSIS — I10 Essential (primary) hypertension: Secondary | ICD-10-CM | POA: Diagnosis not present

## 2016-04-14 DIAGNOSIS — E785 Hyperlipidemia, unspecified: Secondary | ICD-10-CM | POA: Diagnosis not present

## 2016-04-14 DIAGNOSIS — Z170 Estrogen receptor positive status [ER+]: Secondary | ICD-10-CM | POA: Diagnosis not present

## 2016-04-14 DIAGNOSIS — Z510 Encounter for antineoplastic radiation therapy: Secondary | ICD-10-CM | POA: Diagnosis not present

## 2016-04-15 ENCOUNTER — Ambulatory Visit
Admission: RE | Admit: 2016-04-15 | Discharge: 2016-04-15 | Disposition: A | Discharge status: Home / Self Care | Payer: Medicare Other | Source: Ambulatory Visit | Attending: Radiation Oncology | Admitting: Radiation Oncology

## 2016-04-15 DIAGNOSIS — I10 Essential (primary) hypertension: Secondary | ICD-10-CM | POA: Diagnosis not present

## 2016-04-15 DIAGNOSIS — Z510 Encounter for antineoplastic radiation therapy: Secondary | ICD-10-CM | POA: Diagnosis not present

## 2016-04-15 DIAGNOSIS — E785 Hyperlipidemia, unspecified: Secondary | ICD-10-CM | POA: Diagnosis not present

## 2016-04-15 DIAGNOSIS — Z170 Estrogen receptor positive status [ER+]: Secondary | ICD-10-CM | POA: Diagnosis not present

## 2016-04-15 DIAGNOSIS — M8580 Other specified disorders of bone density and structure, unspecified site: Secondary | ICD-10-CM | POA: Diagnosis not present

## 2016-04-15 DIAGNOSIS — C50412 Malignant neoplasm of upper-outer quadrant of left female breast: Secondary | ICD-10-CM | POA: Diagnosis not present

## 2016-04-16 ENCOUNTER — Ambulatory Visit
Admission: RE | Admit: 2016-04-16 | Discharge: 2016-04-16 | Disposition: A | Discharge status: Home / Self Care | Payer: Medicare Other | Source: Ambulatory Visit | Attending: Radiation Oncology | Admitting: Radiation Oncology

## 2016-04-16 ENCOUNTER — Ambulatory Visit: Payer: Medicare Other | Admitting: Radiation Oncology

## 2016-04-16 DIAGNOSIS — E785 Hyperlipidemia, unspecified: Secondary | ICD-10-CM | POA: Diagnosis not present

## 2016-04-16 DIAGNOSIS — I10 Essential (primary) hypertension: Secondary | ICD-10-CM | POA: Diagnosis not present

## 2016-04-16 DIAGNOSIS — Z510 Encounter for antineoplastic radiation therapy: Secondary | ICD-10-CM | POA: Diagnosis not present

## 2016-04-16 DIAGNOSIS — C50412 Malignant neoplasm of upper-outer quadrant of left female breast: Secondary | ICD-10-CM | POA: Diagnosis not present

## 2016-04-16 DIAGNOSIS — M8580 Other specified disorders of bone density and structure, unspecified site: Secondary | ICD-10-CM | POA: Diagnosis not present

## 2016-04-16 DIAGNOSIS — Z170 Estrogen receptor positive status [ER+]: Secondary | ICD-10-CM | POA: Diagnosis not present

## 2016-04-17 ENCOUNTER — Ambulatory Visit
Admission: RE | Admit: 2016-04-17 | Discharge: 2016-04-17 | Disposition: A | Discharge status: Home / Self Care | Payer: Medicare Other | Source: Ambulatory Visit | Attending: Radiation Oncology | Admitting: Radiation Oncology

## 2016-04-17 DIAGNOSIS — C50412 Malignant neoplasm of upper-outer quadrant of left female breast: Secondary | ICD-10-CM | POA: Diagnosis not present

## 2016-04-17 DIAGNOSIS — Z170 Estrogen receptor positive status [ER+]: Secondary | ICD-10-CM | POA: Diagnosis not present

## 2016-04-17 DIAGNOSIS — I10 Essential (primary) hypertension: Secondary | ICD-10-CM | POA: Diagnosis not present

## 2016-04-17 DIAGNOSIS — E785 Hyperlipidemia, unspecified: Secondary | ICD-10-CM | POA: Diagnosis not present

## 2016-04-17 DIAGNOSIS — Z510 Encounter for antineoplastic radiation therapy: Secondary | ICD-10-CM | POA: Diagnosis not present

## 2016-04-17 DIAGNOSIS — M8580 Other specified disorders of bone density and structure, unspecified site: Secondary | ICD-10-CM | POA: Diagnosis not present

## 2016-04-18 ENCOUNTER — Ambulatory Visit
Admission: RE | Admit: 2016-04-18 | Discharge: 2016-04-18 | Disposition: A | Discharge status: Home / Self Care | Payer: Medicare Other | Source: Ambulatory Visit | Attending: Radiation Oncology | Admitting: Radiation Oncology

## 2016-04-18 DIAGNOSIS — M8580 Other specified disorders of bone density and structure, unspecified site: Secondary | ICD-10-CM | POA: Diagnosis not present

## 2016-04-18 DIAGNOSIS — E785 Hyperlipidemia, unspecified: Secondary | ICD-10-CM | POA: Diagnosis not present

## 2016-04-18 DIAGNOSIS — I10 Essential (primary) hypertension: Secondary | ICD-10-CM | POA: Diagnosis not present

## 2016-04-18 DIAGNOSIS — Z170 Estrogen receptor positive status [ER+]: Secondary | ICD-10-CM | POA: Diagnosis not present

## 2016-04-18 DIAGNOSIS — C50412 Malignant neoplasm of upper-outer quadrant of left female breast: Secondary | ICD-10-CM | POA: Diagnosis not present

## 2016-04-18 DIAGNOSIS — Z510 Encounter for antineoplastic radiation therapy: Secondary | ICD-10-CM | POA: Diagnosis not present

## 2016-04-18 NOTE — Progress Notes (Signed)
Department of Radiation Oncology  Phone:  [phone] Fax:        [phone]  Weekly Treatment Note    Name: Jacqueline Jenkins Date: 04/18/2016 MRN: [id number] DOB: [birth year]   Diagnosis:     ICD-9-CM ICD-10-CM   1. Breast cancer of upper-outer quadrant of left female breast ([place]) 174.4 C50.412      Current dose: 42.5 Gy  Current fraction:17   MEDICATIONS: Current Outpatient Prescriptions  Medication Sig Dispense Refill  . benazepril-hydrochlorthiazide (LOTENSIN HCT) 20-12.5 MG tablet TAKE 1 TABLET BY MOUTH DAILY. 90 tablet 1  . fluticasone (FLONASE) 50 MCG/ACT nasal spray Place 1 spray into both nostrils as needed for allergies or rhinitis. Reported on 03/03/2016    . hyaluronate sodium (RADIAPLEXRX) GEL Apply 1 application topically 2 (two) times daily.    Marland Kitchen loratadine (CLARITIN) 10 MG tablet Take 10 mg by mouth daily. Reported on 03/03/2016    . Multiple Vitamin (MULTIVITAMIN) tablet Take 1 tablet by mouth at bedtime. Reported on 0000000    . non-metallic deodorant [name]) MISC Apply 1 application topically daily as needed.    Marland Kitchen omeprazole (PRILOSEC) 10 MG capsule Take 10 mg by mouth daily. Reported on 03/03/2016    . aspirin 81 MG tablet Take 81 mg by mouth daily. Reported on 03/03/2016    . Calcium Carbonate-Vitamin D (CALCIUM + D PO) Take 1 tablet by mouth 2 (two) times daily. Reported on 03/03/2016    . Omega-3 Fatty Acids (FISH OIL) 1200 MG CAPS Take 1 capsule by mouth daily. Reported on 03/03/2016     No current facility-administered medications for this encounter.    Facility-Administered Medications Ordered in Other Encounters  Medication Dose Route Frequency Provider Last Rate Last Dose  . sodium chloride flush (NS) 0.9 % injection 10 mL  10 mL Intracatheter PRN Nicholas Lose, MD   10 mL at 12/20/15 1605     ALLERGIES: Lescol [fluvastatin sodium]; Lipitor [atorvastatin]; and Vytorin [ezetimibe-simvastatin]   LABORATORY DATA:  Lab Results  Component Value Date   WBC  4.2 02/21/2016   HGB 11.6 02/21/2016   HCT 35.4 02/21/2016   MCV 94.1 02/21/2016   PLT 296 02/21/2016   Lab Results  Component Value Date   NA 137 02/21/2016   K 4.7 02/21/2016   CL 101 10/08/2015   CO2 26 02/21/2016   Lab Results  Component Value Date   ALT 17 02/21/2016   AST 18 02/21/2016   ALKPHOS 54 02/21/2016   BILITOT 0.38 02/21/2016     NARRATIVE: Jacqueline Jenkins was seen today for weekly treatment management. The chart was checked and the patient's films were reviewed.  Weekly rad txs 17/20 left breast completed, mild pink color arounf niple are, skin intact,using radaiplex bid, gave 1 month f/u appt with Shona Simpson, PA 06/10/16 patient having surgery fllopian tubes and b/l ovaries removed 05/16/16 There were no vitals taken for this visit.  Wt Readings from Last 3 Encounters:  04/11/16 155 lb 12.8 oz (70.7 kg)  04/04/16 158 lb 3.2 oz (71.8 kg)  03/28/16 155 lb 4.8 oz (70.4 kg)    PHYSICAL EXAMINATION: vitals were not taken for this visit.     The patient's skin looks excellent with mild radiation change. No desquamation.  ASSESSMENT: The patient is doing satisfactorily with treatment.  PLAN: We will continue with the patient's radiation treatment as planned. The patient will return to clinic one month after completings  her course of radiation next week.

## 2016-04-18 NOTE — Progress Notes (Signed)
Complex simulation note  Diagnosis: Left-sided breast cancer  Narrative The patient has initially been planned to receive a course of whole breast radiation to a dose of 42.5 Gy in 17 fractions. The patient will now receive an additional boost to the seroma cavity which has been contoured. This will correspond to a boost of 7.5 Gy at 2.5 Gy per fraction. To accomplish this, an additional 2 customized blocks have been designed for this purpose. A complex isodose plan is requested to ensure that the target area is adequately covered with radiation dose and that the nearby normal structures such as the lung are adequately spared. The patient's final total dose will be 50 Gy.  ------------------------------------------------  Jodelle Gross, MD, PhD

## 2016-04-18 NOTE — Progress Notes (Addendum)
Weekly rad txs 17/20 left breast completed, mild pink color arounf niple are, skin intact,using radaiplex bid, gave 1 month f/u appt with Shona Simpson, PA 06/10/16 patient having surgery fllopian tubes and b/l ovaries removed 05/16/16 There were no vitals taken for this visit.  Wt Readings from Last 3 Encounters:  04/11/16 155 lb 12.8 oz (70.7 kg)  04/04/16 158 lb 3.2 oz (71.8 kg)  03/28/16 155 lb 4.8 oz (70.4 kg)

## 2016-04-20 ENCOUNTER — Other Ambulatory Visit: Payer: Self-pay | Admitting: Internal Medicine

## 2016-04-21 ENCOUNTER — Ambulatory Visit
Admission: RE | Admit: 2016-04-21 | Discharge: 2016-04-21 | Disposition: A | Discharge status: Home / Self Care | Payer: Medicare Other | Source: Ambulatory Visit | Attending: Radiation Oncology | Admitting: Radiation Oncology

## 2016-04-21 DIAGNOSIS — I10 Essential (primary) hypertension: Secondary | ICD-10-CM | POA: Diagnosis not present

## 2016-04-21 DIAGNOSIS — E785 Hyperlipidemia, unspecified: Secondary | ICD-10-CM | POA: Diagnosis not present

## 2016-04-21 DIAGNOSIS — Z510 Encounter for antineoplastic radiation therapy: Secondary | ICD-10-CM | POA: Diagnosis not present

## 2016-04-21 DIAGNOSIS — M8580 Other specified disorders of bone density and structure, unspecified site: Secondary | ICD-10-CM | POA: Diagnosis not present

## 2016-04-21 DIAGNOSIS — Z170 Estrogen receptor positive status [ER+]: Secondary | ICD-10-CM | POA: Diagnosis not present

## 2016-04-21 DIAGNOSIS — C50412 Malignant neoplasm of upper-outer quadrant of left female breast: Secondary | ICD-10-CM | POA: Diagnosis not present

## 2016-04-22 ENCOUNTER — Ambulatory Visit
Admission: RE | Admit: 2016-04-22 | Discharge: 2016-04-22 | Disposition: A | Discharge status: Home / Self Care | Payer: Medicare Other | Source: Ambulatory Visit | Attending: Radiation Oncology | Admitting: Radiation Oncology

## 2016-04-22 DIAGNOSIS — Z510 Encounter for antineoplastic radiation therapy: Secondary | ICD-10-CM | POA: Diagnosis not present

## 2016-04-22 DIAGNOSIS — C50412 Malignant neoplasm of upper-outer quadrant of left female breast: Secondary | ICD-10-CM | POA: Diagnosis not present

## 2016-04-22 DIAGNOSIS — Z170 Estrogen receptor positive status [ER+]: Secondary | ICD-10-CM | POA: Diagnosis not present

## 2016-04-22 DIAGNOSIS — E785 Hyperlipidemia, unspecified: Secondary | ICD-10-CM | POA: Diagnosis not present

## 2016-04-22 DIAGNOSIS — I10 Essential (primary) hypertension: Secondary | ICD-10-CM | POA: Diagnosis not present

## 2016-04-22 DIAGNOSIS — M8580 Other specified disorders of bone density and structure, unspecified site: Secondary | ICD-10-CM | POA: Diagnosis not present

## 2016-04-23 ENCOUNTER — Ambulatory Visit
Admission: RE | Admit: 2016-04-23 | Discharge: 2016-04-23 | Disposition: A | Discharge status: Home / Self Care | Payer: Medicare Other | Source: Ambulatory Visit | Attending: Radiation Oncology | Admitting: Radiation Oncology

## 2016-04-23 ENCOUNTER — Encounter: Payer: Self-pay | Admitting: Radiation Oncology

## 2016-04-23 DIAGNOSIS — C50412 Malignant neoplasm of upper-outer quadrant of left female breast: Secondary | ICD-10-CM | POA: Diagnosis not present

## 2016-04-23 DIAGNOSIS — M8580 Other specified disorders of bone density and structure, unspecified site: Secondary | ICD-10-CM | POA: Diagnosis not present

## 2016-04-23 DIAGNOSIS — Z170 Estrogen receptor positive status [ER+]: Secondary | ICD-10-CM | POA: Diagnosis not present

## 2016-04-23 DIAGNOSIS — E785 Hyperlipidemia, unspecified: Secondary | ICD-10-CM | POA: Diagnosis not present

## 2016-04-23 DIAGNOSIS — Z510 Encounter for antineoplastic radiation therapy: Secondary | ICD-10-CM | POA: Diagnosis not present

## 2016-04-23 DIAGNOSIS — I10 Essential (primary) hypertension: Secondary | ICD-10-CM | POA: Diagnosis not present

## 2016-04-25 ENCOUNTER — Telehealth: Payer: Self-pay | Admitting: *Deleted

## 2016-04-25 DIAGNOSIS — C50412 Malignant neoplasm of upper-outer quadrant of left female breast: Secondary | ICD-10-CM

## 2016-04-25 NOTE — Telephone Encounter (Signed)
  Oncology Nurse Navigator Documentation  Navigator Location: [facility] (04/25/16 1600) Navigator Encounter Type: Telephone (04/25/16 1600) Telephone: [place] Call (04/25/16 1600)     Surgery Date: 08/29/15 (04/25/16 1600)   Patient Visit Type: [id number] (04/25/16 1600) Treatment Phase: Final Radiation Tx (04/25/16 1600)                            Time Spent with Patient: 15 (04/25/16 1600)

## 2016-04-27 ENCOUNTER — Telehealth: Payer: Self-pay | Admitting: Hematology and Oncology

## 2016-04-27 NOTE — Telephone Encounter (Signed)
Lvm advising apt 11/10 @ 11am. Also, mailed appt calendar.

## 2016-05-05 NOTE — Progress Notes (Signed)
  Radiation Oncology         [phone] ________________________________  Name: Jacqueline Jenkins MRN: [id number]  Date: 04/23/2016  DOB: Apr 06, 1951  End of Treatment Note  Diagnosis:   Left-sided breast cancer     Indication for treatment:  Curative       Radiation treatment dates:   03/27/2016 through 04/23/2016  Site/dose:   The patient initially received a dose of 42.5 Gy in 17 fractions to the breast using whole-breast tangent fields. This was delivered using a 3-D conformal technique. The patient then received a boost to the seroma. This delivered an additional 7.5 Gy in 3 fractions using a 3 field photon technique due to the depth of the seroma. The total dose was 50 Gy.  Narrative: The patient tolerated radiation treatment relatively well.   The patient had some expected skin irritation as she progressed during treatment. Moist desquamation was not present at the end of treatment.  Plan: The patient has completed radiation treatment. The patient will return to radiation oncology clinic for routine followup in one month. I advised the patient to call or return sooner if they have any questions or concerns related to their recovery or treatment. ________________________________  Jodelle Gross, M.D., Ph.D.

## 2016-05-06 NOTE — H&P (Signed)
Jacqueline Jenkins is an 65 y.o. female G2P2 presenting for a scheduled bilateral removal of tubes and ovaries.   Pt  s/p lumpectomy and sentinel node dissection with ER+,PR+, HER 2 negative and node positive and mammoprint high risk.  Work-up revealed  BRCA-2 positive diagnosis. Oncology has recommended oophorectomy. Pap 10/15 negative. Other health maintenence UTD with colonoscopy 2013. She has only mild hot flashes and is not sexually active due to husband's Parkinsons.   Pertinent Gynecological History: OB History: NSVD x 2   Menstrual History:  No LMP recorded. Patient is postmenopausal.    Past Medical History:  Diagnosis Date  . Allergic rhinitis, cause unspecified   . BRCA2 positive   . Breast cancer of upper-outer quadrant of left female breast ([place])   . DDD (degenerative disc disease)   . Hyperlipidemia   . Hypertension   . Osteopenia 12/2011   Spine T -0.4, Femur -2.1  . PONV (postoperative nausea and vomiting)   . Splenic artery aneurysm ([place]) 2013   COILS DONE  . Vitamin D deficiency     Past Surgical History:  Procedure Laterality Date  . BREAST LUMPECTOMY WITH RADIOACTIVE SEED AND SENTINEL LYMPH NODE BIOPSY Left 08/29/2015   Procedure: LEFT BREAST LUMPECTOMY WITH RADIOACTIVE SEED WITH AXILLARY SENTINEL LYMPH NODE BIOPSY;  Surgeon: Excell Seltzer, MD;  Location: [facility];  Service: General;  Laterality: Left;  . [place]  . COLONOSCOPY  Jan. 7, 2014  . [facility]GERY  2001, 2010  . PORTACATH PLACEMENT Bilateral 10/09/2015   Procedure: INSERTION PORT-A-CATH;  Surgeon: Excell Seltzer, MD;  Location: [facility];  Service: General;  Laterality: Bilateral;  . RE-EXCISION OF BREAST LUMPECTOMY Left 09/07/2015   Procedure: RE-EXCISION OF LEFT BREAST LUMPECTOMY;  Surgeon: Excell Seltzer, MD;  Location: [facility];  Service: General;  Laterality: Left;  . SPINE SURGERY  1998   cervical diskectomy  . SPINE SURGERY  2001,  2010   Lumbar diskectomy  . splenic aneurysm  02/10/2012   Aneurysm of splenic artery  . TONSILLECTOMY    . VISCERAL ANGIOGRAM N/A 02/10/2012   Procedure: VISCERAL ANGIOGRAM;  Surgeon: Serafina Mitchell, MD;  Location: [facility] CATH LAB;  Service: Cardiovascular;  Laterality: N/A;    Family History  Problem Relation Age of Onset  . Stroke Mother   . Osteoporosis Mother   . Hyperlipidemia Mother   . Hypertension Mother   . Other Mother     varicose veins  . Deep vein thrombosis Mother   . Diverticulitis Mother   . Breast cancer Maternal Grandmother     dx. 30s-40s; when pt's mother was 14 years old  . Other Father     bleeding problems  . Hypertension Father   . Heart attack Father   . Stroke Father   . Other Sister     one sister had a hysterectomy for fibroids  . Leukemia Maternal Aunt     dx. 50s-60s  . Pancreatic cancer Maternal Uncle     dx. late 60s-early 70s; (x2 maternal uncles)  . Heart attack Paternal Aunt   . Stroke Paternal Aunt   . Stroke Maternal Grandfather   . Heart attack Paternal Grandmother   . Stroke Paternal Grandmother   . Heart Problems Paternal Grandfather   . Colon cancer Maternal Uncle     dx. late 7s  . Heart defect Maternal Aunt     possible  . Breast cancer Cousin  dx. 50s-60s; maternal first  . Heart attack Paternal Uncle   . Stroke Paternal Uncle   . Lung cancer Maternal Uncle     treated at [facility]; d. 88s    Social History:  reports that she has never smoked. She has never used smokeless tobacco. She reports that she drinks alcohol. She reports that she does not use drugs.  Allergies:  Allergies  Allergen Reactions  . Lescol [Fluvastatin Sodium]     Myalgia  . Lipitor [Atorvastatin]     Increased LFT's  . Vytorin [Ezetimibe-Simvastatin]     myalgia    No prescriptions prior to admission.    Review of Systems  Constitutional: Negative for fever.  Gastrointestinal: Negative for abdominal pain.    There were no vitals taken for  this visit. Physical Exam  Constitutional: She appears well-developed and well-nourished.  Cardiovascular: Normal rate.   Respiratory: Effort normal.  GI: Soft.  Genitourinary: Vagina normal and uterus normal.  Neurological: She is alert.  Psychiatric: She has a normal mood and affect.    No results found for this or any previous visit (from the past 24 hour(s)).  No results found.  Assessment/Plan: d/w pt laparoscopic BSO procedure in detail. We discussed risks and benefits of procedure including bleeding, infection and possible damage to bowel and bladder. We discussed the need for an open incision if any complication arose, and a delay in her recovery with possible longer hospitalization. We discussed complete removal of tubes and ovaries and sending to pathology for detailed sectioning. Pt desires to proceed.   Logan Bores 05/06/2016, 9:42 AM

## 2016-05-08 NOTE — Patient Instructions (Signed)
Your procedure is scheduled on:  Friday, Sept. 15, 2017  Enter through the [employer] [facility] at:  6:00AM  Pick up the phone at the desk and dial [phone].  Call this number if you have problems the morning of surgery: [phone].  Remember: Do NOT eat food or drink after:  Midnight Thursday  Take these medicines the morning of surgery with a SIP OF WATER:  Benazepril  Do NOT wear jewelry (body piercing), metal hair clips/bobby pins, make-up, or nail polish. Do NOT wear lotions, powders, or perfumes.  You may wear deodorant. Do NOT shave for 48 hours prior to surgery. Do NOT bring valuables to the hospital. Contacts, dentures, or bridgework may not be worn into surgery.  Have a responsible adult drive you home and stay with you for 24 hours after your procedure

## 2016-05-09 ENCOUNTER — Encounter (HOSPITAL_COMMUNITY): Payer: Self-pay

## 2016-05-09 ENCOUNTER — Other Ambulatory Visit: Payer: Self-pay

## 2016-05-09 ENCOUNTER — Encounter (HOSPITAL_COMMUNITY)
Admission: RE | Admit: 2016-05-09 | Discharge: 2016-05-09 | Disposition: A | Discharge status: Home / Self Care | Payer: Medicare Other | Source: Ambulatory Visit | Attending: Obstetrics and Gynecology | Admitting: Obstetrics and Gynecology

## 2016-05-09 DIAGNOSIS — E785 Hyperlipidemia, unspecified: Secondary | ICD-10-CM | POA: Insufficient documentation

## 2016-05-09 DIAGNOSIS — I10 Essential (primary) hypertension: Secondary | ICD-10-CM | POA: Diagnosis not present

## 2016-05-09 DIAGNOSIS — Z9889 Other specified postprocedural states: Secondary | ICD-10-CM | POA: Diagnosis not present

## 2016-05-09 DIAGNOSIS — Z01812 Encounter for preprocedural laboratory examination: Secondary | ICD-10-CM | POA: Insufficient documentation

## 2016-05-09 DIAGNOSIS — M8580 Other specified disorders of bone density and structure, unspecified site: Secondary | ICD-10-CM | POA: Insufficient documentation

## 2016-05-09 DIAGNOSIS — Z01810 Encounter for preprocedural cardiovascular examination: Secondary | ICD-10-CM | POA: Insufficient documentation

## 2016-05-09 DIAGNOSIS — Z1501 Genetic susceptibility to malignant neoplasm of breast: Secondary | ICD-10-CM | POA: Insufficient documentation

## 2016-05-09 LAB — CBC
HCT: 37.8 % (ref 36.0–46.0)
Hemoglobin: 12.6 g/dL (ref 12.0–15.0)
MCH: 30.4 pg (ref 26.0–34.0)
MCHC: 33.3 g/dL (ref 30.0–36.0)
MCV: 91.1 fL (ref 78.0–100.0)
Platelets: 249 10*3/uL (ref 150–400)
RBC: 4.15 MIL/uL (ref 3.87–5.11)
RDW: 14.6 % (ref 11.5–15.5)
WBC: 4 10*3/uL (ref 4.0–10.5)

## 2016-05-09 LAB — BASIC METABOLIC PANEL
Anion gap: 7 (ref 5–15)
BUN: 13 mg/dL (ref 6–20)
CALCIUM: 9.6 mg/dL (ref 8.9–10.3)
CO2: 26 mmol/L (ref 22–32)
Chloride: 100 mmol/L — ABNORMAL LOW (ref 101–111)
Creatinine, Ser: 0.68 mg/dL (ref 0.44–1.00)
GFR calc non Af Amer: >60 mL/min (ref >60)
Glucose, Bld: 98 mg/dL (ref 65–99)
Potassium: 4.4 mmol/L (ref 3.5–5.1)
Sodium: 133 mmol/L — ABNORMAL LOW (ref 135–145)

## 2016-05-15 NOTE — H&P (Signed)
Jacqueline Jenkins is an 65 y.o. female G2P2 for laparoscopic BSO given  diagnosis of BRCA-2 positive breast cancer.  She is s/p lumpectomy and sentinel node dissection with ER+,PR+, HER 2 negative and node positive and mammoprint high risk. Oncology has recommended oophorectomy. Pap 10/15 negative. Other health maintenence UTD with colonoscopy 2013. She has only mild hot flashes and is not sexually active due to husband's Parkinsons. She is coping well with all so far.  Pertinent Gynecological History:  OB History: NSVD x 2   Menstrual History:  No LMP recorded. Patient is postmenopausal.    Past Medical History:  Diagnosis Date  . Allergic rhinitis, cause unspecified   . Anemia    history of anemia while teenager  . BRCA2 positive   . Breast cancer of upper-outer quadrant of left female breast ([place])    chemo complete 01/2016, radiation 04/2016  . DDD (degenerative disc disease)   . Hyperlipidemia   . Hypertension   . Osteopenia 12/2011   Spine T -0.4, Femur -2.1  . PONV (postoperative nausea and vomiting)   . Splenic artery aneurysm ([place]) 2013   COILS DONE  . Vitamin D deficiency     Past Surgical History:  Procedure Laterality Date  . BREAST LUMPECTOMY WITH RADIOACTIVE SEED AND SENTINEL LYMPH NODE BIOPSY Left 08/29/2015   Procedure: LEFT BREAST LUMPECTOMY WITH RADIOACTIVE SEED WITH AXILLARY SENTINEL LYMPH NODE BIOPSY;  Surgeon: Excell Seltzer, MD;  Location: [facility];  Service: General;  Laterality: Left;  . [place]  . COLONOSCOPY  Jan. 7, 2014  . [place] SURGERY  2001, 2010  . PORTACATH PLACEMENT Bilateral 10/09/2015   Procedure: INSERTION PORT-A-CATH;  Surgeon: Excell Seltzer, MD;  Location: [facility];  Service: General;  Laterality: Bilateral;  . Portacath removal    . RE-EXCISION OF BREAST LUMPECTOMY Left 09/07/2015   Procedure: RE-EXCISION OF LEFT BREAST LUMPECTOMY;  Surgeon: Excell Seltzer, MD;  Location: [facility];   Service: General;  Laterality: Left;  . SPINE SURGERY  1998   cervical diskectomy  . SPINE SURGERY  2001, 2010   Lumbar diskectomy  . splenic aneurysm  02/10/2012   Aneurysm of splenic artery  . TONSILLECTOMY    . VISCERAL ANGIOGRAM N/A 02/10/2012   Procedure: VISCERAL ANGIOGRAM;  Surgeon: Serafina Mitchell, MD;  Location: [facility] CATH LAB;  Service: Cardiovascular;  Laterality: N/A;    Family History  Problem Relation Age of Onset  . Stroke Mother   . Osteoporosis Mother   . Hyperlipidemia Mother   . Hypertension Mother   . Other Mother     varicose veins  . Deep vein thrombosis Mother   . Diverticulitis Mother   . Breast cancer Maternal Grandmother     dx. 30s-40s; when pt's mother was 34 years old  . Other Father     bleeding problems  . Hypertension Father   . Heart attack Father   . Stroke Father   . Other Sister     one sister had a hysterectomy for fibroids  . Leukemia Maternal Aunt     dx. 50s-60s  . Pancreatic cancer Maternal Uncle     dx. late 60s-early 70s; (x2 maternal uncles)  . Heart attack Paternal Aunt   . Stroke Paternal Aunt   . Stroke Maternal Grandfather   . Heart attack Paternal Grandmother   . Stroke Paternal Grandmother   . Heart Problems Paternal Grandfather   . Colon cancer Maternal Uncle  dx. late 44s  . Heart defect Maternal Aunt     possible  . Breast cancer Cousin     dx. 50s-60s; maternal first  . Heart attack Paternal Uncle   . Stroke Paternal Uncle   . Lung cancer Maternal Uncle     treated at [facility]; d. 47s    Social History:  reports that she has never smoked. She has never used smokeless tobacco. She reports that she drinks alcohol. She reports that she does not use drugs.  Allergies:  Allergies  Allergen Reactions  . Lescol [Fluvastatin Sodium]     Myalgia  . Lipitor [Atorvastatin]     Increased LFT's  . Vytorin [Ezetimibe-Simvastatin]     myalgia    No prescriptions prior to admission.    Review of Systems   Constitutional: Negative for fever.  Gastrointestinal: Negative for abdominal pain.    There were no vitals taken for this visit. Physical Exam  Constitutional: She is oriented to person, place, and time. She appears well-developed and well-nourished.  Cardiovascular: Normal rate and regular rhythm.   Respiratory: Effort normal.  GI: Soft.  Genitourinary: Vagina normal and uterus normal.  Neurological: She is alert and oriented to person, place, and time.  Psychiatric: She has a normal mood and affect.    No results found for this or any previous visit (from the past 24 hour(s)).  No results found.  Assessment/Plan: d/w pt laparoscopic BSO procedure in detail. We discussed risks and benefits of procedure including bleeding, infection and possible damage to bowel and bladder. We discussed the need for an open incision if any complication arose, and a delay in her recovery with possible longer hospitalization. We discussed complete removal of tubes and ovaries and sending to pathology for detailed sectioning. Pt desires to proceed.  Logan Bores 05/15/2016, 5:10 PM

## 2016-05-15 NOTE — Anesthesia Preprocedure Evaluation (Addendum)
Anesthesia Evaluation  Patient identified by MRN, date of birth, ID band Patient awake    Reviewed: Allergy & Precautions, NPO status , Patient's Chart, lab work & pertinent test results  History of Anesthesia Complications (+) PONV  Airway Mallampati: III  TM Distance: <3 FB Neck ROM: Full    Dental no notable dental hx.    Pulmonary neg pulmonary ROS,    Pulmonary exam normal breath sounds clear to auscultation       Cardiovascular hypertension, Normal cardiovascular exam Rhythm:Regular Rate:Normal     Neuro/Psych negative neurological ROS  negative psych ROS   GI/Hepatic negative GI ROS, Neg liver ROS,   Endo/Other  negative endocrine ROS  Renal/GU negative Renal ROS  negative genitourinary   Musculoskeletal negative musculoskeletal ROS (+)   Abdominal   Peds negative pediatric ROS (+)  Hematology negative hematology ROS (+)   Anesthesia Other Findings   Reproductive/Obstetrics negative OB ROS                            Anesthesia Physical Anesthesia Plan  ASA: II  Anesthesia Plan: General   Post-op Pain Management:    Induction: Intravenous  Airway Management Planned: Oral ETT  Additional Equipment:   Intra-op Plan:   Post-operative Plan: Extubation in OR  Informed Consent: I have reviewed the patients History and Physical, chart, labs and discussed the procedure including the risks, benefits and alternatives for the proposed anesthesia with the patient or authorized representative who has indicated his/her understanding and acceptance.   Dental advisory given  Plan Discussed with: CRNA and Surgeon  Anesthesia Plan Comments:         Anesthesia Quick Evaluation

## 2016-05-16 ENCOUNTER — Ambulatory Visit (HOSPITAL_COMMUNITY): Payer: Medicare Other | Admitting: Anesthesiology

## 2016-05-16 ENCOUNTER — Encounter (HOSPITAL_COMMUNITY): Payer: Self-pay

## 2016-05-16 ENCOUNTER — Encounter (HOSPITAL_COMMUNITY)
Admission: RE | Disposition: A | Discharge status: Home / Self Care | Payer: Self-pay | Source: Ambulatory Visit | Attending: Obstetrics and Gynecology

## 2016-05-16 ENCOUNTER — Ambulatory Visit (HOSPITAL_COMMUNITY)
Admission: RE | Admit: 2016-05-16 | Discharge: 2016-05-16 | Disposition: A | Discharge status: Home / Self Care | Payer: Medicare Other | Source: Ambulatory Visit | Attending: Obstetrics and Gynecology | Admitting: Obstetrics and Gynecology

## 2016-05-16 DIAGNOSIS — I10 Essential (primary) hypertension: Secondary | ICD-10-CM | POA: Insufficient documentation

## 2016-05-16 DIAGNOSIS — Z4002 Encounter for prophylactic removal of ovary: Secondary | ICD-10-CM | POA: Insufficient documentation

## 2016-05-16 DIAGNOSIS — Z853 Personal history of malignant neoplasm of breast: Secondary | ICD-10-CM | POA: Insufficient documentation

## 2016-05-16 DIAGNOSIS — N9489 Other specified conditions associated with female genital organs and menstrual cycle: Secondary | ICD-10-CM | POA: Diagnosis not present

## 2016-05-16 DIAGNOSIS — Z1501 Genetic susceptibility to malignant neoplasm of breast: Secondary | ICD-10-CM | POA: Diagnosis not present

## 2016-05-16 HISTORY — PX: LAPAROSCOPIC BILATERAL SALPINGO OOPHERECTOMY: SHX5890

## 2016-05-16 SURGERY — SALPINGO-OOPHORECTOMY, BILATERAL, LAPAROSCOPIC
Anesthesia: General | Laterality: Bilateral

## 2016-05-16 MED ORDER — MIDAZOLAM HCL 5 MG/5ML IJ SOLN
INTRAMUSCULAR | Status: DC | PRN
  Administered 2016-05-16: 2 mg via INTRAVENOUS

## 2016-05-16 MED ORDER — FENTANYL CITRATE (PF) 100 MCG/2ML IJ SOLN
INTRAMUSCULAR | Status: DC | PRN
  Administered 2016-05-16: 50 ug via INTRAVENOUS
  Administered 2016-05-16: 100 ug via INTRAVENOUS

## 2016-05-16 MED ORDER — SCOPOLAMINE 1 MG/3DAYS TD PT72: 1.0000 | MEDICATED_PATCH | Freq: Once | TRANSDERMAL | Status: DC

## 2016-05-16 MED ORDER — PROPOFOL 10 MG/ML IV BOLUS
INTRAVENOUS | Status: DC | PRN
  Administered 2016-05-16: 150 mg via INTRAVENOUS

## 2016-05-16 MED ORDER — KETOROLAC TROMETHAMINE 30 MG/ML IJ SOLN
INTRAMUSCULAR | Status: AC
  Filled 2016-05-16: qty 1

## 2016-05-16 MED ORDER — KETOROLAC TROMETHAMINE 30 MG/ML IJ SOLN: 30.0000 mg | Freq: Once | INTRAMUSCULAR | Status: DC | PRN

## 2016-05-16 MED ORDER — ROCURONIUM BROMIDE 100 MG/10ML IV SOLN
INTRAVENOUS | Status: DC | PRN
  Administered 2016-05-16: 50 mg via INTRAVENOUS

## 2016-05-16 MED ORDER — LACTATED RINGERS IV SOLN
INTRAVENOUS | Status: DC
  Administered 2016-05-16: 125 mL/h via INTRAVENOUS
  Administered 2016-05-16: 08:00:00 via INTRAVENOUS

## 2016-05-16 MED ORDER — LIDOCAINE HCL (CARDIAC) 20 MG/ML IV SOLN
INTRAVENOUS | Status: DC | PRN
  Administered 2016-05-16: 100 mg via INTRAVENOUS

## 2016-05-16 MED ORDER — DEXAMETHASONE SODIUM PHOSPHATE 10 MG/ML IJ SOLN
INTRAMUSCULAR | Status: DC | PRN
  Administered 2016-05-16: 10 mg via INTRAVENOUS

## 2016-05-16 MED ORDER — ONDANSETRON HCL 4 MG/2ML IJ SOLN
INTRAMUSCULAR | Status: DC | PRN
  Administered 2016-05-16: 4 mg via INTRAVENOUS

## 2016-05-16 MED ORDER — BUPIVACAINE HCL (PF) 0.25 % IJ SOLN
INTRAMUSCULAR | Status: AC
  Filled 2016-05-16: qty 30

## 2016-05-16 MED ORDER — ROCURONIUM BROMIDE 100 MG/10ML IV SOLN
INTRAVENOUS | Status: AC
  Filled 2016-05-16: qty 1

## 2016-05-16 MED ORDER — LIDOCAINE HCL (CARDIAC) 20 MG/ML IV SOLN
INTRAVENOUS | Status: AC
  Filled 2016-05-16: qty 5

## 2016-05-16 MED ORDER — LACTATED RINGERS IV SOLN: INTRAVENOUS | Status: DC

## 2016-05-16 MED ORDER — OXYCODONE HCL 5 MG/5ML PO SOLN: 5.0000 mg | Freq: Once | ORAL | Status: DC | PRN

## 2016-05-16 MED ORDER — IBUPROFEN 200 MG PO TABS: 600.0000 mg | ORAL_TABLET | Freq: Four times a day (QID) | ORAL | 0 refills | Status: DC | PRN

## 2016-05-16 MED ORDER — SUGAMMADEX SODIUM 200 MG/2ML IV SOLN
INTRAVENOUS | Status: DC | PRN
  Administered 2016-05-16: 150 mg via INTRAVENOUS

## 2016-05-16 MED ORDER — ONDANSETRON HCL 4 MG/2ML IJ SOLN
INTRAMUSCULAR | Status: AC
  Filled 2016-05-16: qty 2

## 2016-05-16 MED ORDER — KETOROLAC TROMETHAMINE 30 MG/ML IJ SOLN
INTRAMUSCULAR | Status: DC | PRN
  Administered 2016-05-16: 30 mg via INTRAVENOUS

## 2016-05-16 MED ORDER — DEXAMETHASONE SODIUM PHOSPHATE 10 MG/ML IJ SOLN
INTRAMUSCULAR | Status: AC
  Filled 2016-05-16: qty 1

## 2016-05-16 MED ORDER — MIDAZOLAM HCL 2 MG/2ML IJ SOLN
INTRAMUSCULAR | Status: AC
  Filled 2016-05-16: qty 2

## 2016-05-16 MED ORDER — FENTANYL CITRATE (PF) 100 MCG/2ML IJ SOLN: 25.0000 ug | INTRAMUSCULAR | Status: DC | PRN

## 2016-05-16 MED ORDER — FENTANYL CITRATE (PF) 250 MCG/5ML IJ SOLN
INTRAMUSCULAR | Status: AC
  Filled 2016-05-16: qty 5

## 2016-05-16 MED ORDER — SUGAMMADEX SODIUM 200 MG/2ML IV SOLN
INTRAVENOUS | Status: AC
  Filled 2016-05-16: qty 2

## 2016-05-16 MED ORDER — OXYCODONE HCL 5 MG PO TABS: 5.0000 mg | ORAL_TABLET | Freq: Once | ORAL | Status: DC | PRN

## 2016-05-16 MED ORDER — PROMETHAZINE HCL 25 MG/ML IJ SOLN: 6.2500 mg | INTRAMUSCULAR | Status: DC | PRN

## 2016-05-16 MED ORDER — PROPOFOL 10 MG/ML IV BOLUS
INTRAVENOUS | Status: AC
  Filled 2016-05-16: qty 20

## 2016-05-16 MED ORDER — BUPIVACAINE HCL (PF) 0.25 % IJ SOLN
INTRAMUSCULAR | Status: DC | PRN
  Administered 2016-05-16: 12 mL

## 2016-05-16 SURGICAL SUPPLY — 31 items
BAG SPEC RTRVL LRG 6X4 10 (ENDOMECHANICALS) ×1
CABLE HIGH FREQUENCY MONO STRZ (ELECTRODE) IMPLANT
CATH ROBINSON RED A/P 16FR (CATHETERS) ×1 IMPLANT
CLOTH BEACON ORANGE TIMEOUT ST (SAFETY) ×2 IMPLANT
DECANTER SPIKE VIAL GLASS SM (MISCELLANEOUS) ×2 IMPLANT
DRSG COVADERM PLUS 2X2 (GAUZE/BANDAGES/DRESSINGS) ×2 IMPLANT
DRSG OPSITE POSTOP 3X4 (GAUZE/BANDAGES/DRESSINGS) ×1 IMPLANT
DURAPREP 26ML APPLICATOR (WOUND CARE) ×2 IMPLANT
GLOVE BIO SURGEON STRL SZ 6.5 (GLOVE) ×2 IMPLANT
GLOVE BIO SURGEON STRL SZ8 (GLOVE) ×2 IMPLANT
GLOVE BIOGEL PI IND STRL 7.0 (GLOVE) ×1 IMPLANT
GLOVE BIOGEL PI INDICATOR 7.0 (GLOVE) ×1
GOWN STRL REUS W/TWL LRG LVL3 (GOWN DISPOSABLE) ×4 IMPLANT
LIQUID BAND (GAUZE/BANDAGES/DRESSINGS) ×2 IMPLANT
NEEDLE INSUFFLATION 120MM (ENDOMECHANICALS) ×2 IMPLANT
NS IRRIG 1000ML POUR BTL (IV SOLUTION) ×2 IMPLANT
PACK LAPAROSCOPY BASIN (CUSTOM PROCEDURE TRAY) ×2 IMPLANT
PAD TRENDELENBURG POSITION (MISCELLANEOUS) ×2 IMPLANT
POUCH SPECIMEN RETRIEVAL 10MM (ENDOMECHANICALS) ×1 IMPLANT
SET IRRIG TUBING LAPAROSCOPIC (IRRIGATION / IRRIGATOR) IMPLANT
SHEARS HARMONIC ACE PLUS 36CM (ENDOMECHANICALS) ×1 IMPLANT
SLEEVE XCEL OPT CAN 5 100 (ENDOMECHANICALS) ×2 IMPLANT
SOLUTION ELECTROLUBE (MISCELLANEOUS) IMPLANT
SUT VIC AB 3-0 PS2 18 (SUTURE) ×2
SUT VIC AB 3-0 PS2 18XBRD (SUTURE) ×1 IMPLANT
SUT VICRYL 0 UR6 27IN ABS (SUTURE) ×2 IMPLANT
TOWEL OR 17X24 6PK STRL BLUE (TOWEL DISPOSABLE) ×4 IMPLANT
TROCAR XCEL NON-BLD 11X100MML (ENDOMECHANICALS) ×1 IMPLANT
TROCAR XCEL NON-BLD 5MMX100MML (ENDOMECHANICALS) ×2 IMPLANT
WARMER LAPAROSCOPE (MISCELLANEOUS) ×2 IMPLANT
WATER STERILE IRR 1000ML POUR (IV SOLUTION) ×2 IMPLANT

## 2016-05-16 NOTE — Transfer of Care (Signed)
Immediate Anesthesia Transfer of Care Note  Patient: Jacqueline Jenkins  Procedure(s) Performed: Procedure(s): LAPAROSCOPIC BILATERAL SALPINGO OOPHORECTOMY (Bilateral)  Patient Location: PACU  Anesthesia Type:General  Level of Consciousness: awake, alert  and oriented  Airway & Oxygen Therapy: Patient Spontanous Breathing and Patient connected to nasal cannula oxygen  Post-op Assessment: Report given to RN and Post -op Vital signs reviewed and stable  Post vital signs: Reviewed and stable  Last Vitals:  Vitals:   05/16/16 0638  BP: 138/72  Pulse: 67  Resp: 16  Temp: 36.7 C    Last Pain:  Vitals:   05/16/16 0638  TempSrc: Oral      Patients Stated Pain Goal: 4 ([id number])  Complications: No apparent anesthesia complications

## 2016-05-16 NOTE — Progress Notes (Signed)
Patient ID: Jacqueline Jenkins, female   DOB: 1951-02-17, 65 y.o.   MRN: [id number] Per pt no changes in dictated H&P.  Brief exam WNL, ready to proceed.

## 2016-05-16 NOTE — Anesthesia Postprocedure Evaluation (Signed)
Anesthesia Post Note  Patient: Jacqueline Jenkins  Procedure(s) Performed: Procedure(s) (LRB): LAPAROSCOPIC BILATERAL SALPINGO OOPHORECTOMY (Bilateral)  Patient location during evaluation: PACU Anesthesia Type: General Level of consciousness: awake Pain management: pain level controlled Vital Signs Assessment: post-procedure vital signs reviewed and stable Respiratory status: spontaneous breathing Cardiovascular status: stable Postop Assessment: no signs of nausea or vomiting Anesthetic complications: no     Last Vitals:  Vitals:   05/16/16 0900 05/16/16 0915  BP: 133/86 136/71  Pulse: 69 63  Resp: 12   Temp:      Last Pain:  Vitals:   05/16/16 0915  TempSrc:   PainSc: 3    Pain Goal: Patients Stated Pain Goal: 4 (05/16/16 0900)               Konica Stankowski JR,JOHN Mateo Flow

## 2016-05-16 NOTE — Op Note (Signed)
Operative Note    Preoperative Diagnosis Personal h/o breast cancer BRCA-2 Positive Gene Carrier  Postoperative Diagnosis same  Procedure Laparoscopic bilateral salpingoophorectomy  Surgeon Paula Compton, MD Carlynn Purl, MD  Anesthesia GETA  Fluids:   EBL 50cc UOP 50cc straight cath prior to procedure IVF 1200cc LR  Findings Normal appearing ovaries and tubes.  Uterus slightly larger than typical postmenopausal uterus, but normal in appearance Other pelvic anatomy WNL  Specimen Bilateral ovaries and tubes to path for thin section study  Procedure Note Patient was taken to the operating room where general anesthesia was obtained without difficulty. She was then prepped and draped in the normal sterile fashion in the dorsal lithotomy position. An appropriate timeout was performed. A speculum was then placed within the vagina and a Hulka tenaculum placed within the cervix for uterine manipulation. An in and out catheterization was performed.  Attention was then turned to the patient's abdomen after draping where the infraumbilical area was injected with approximately 10 cc of quarter percent Marcaine. A 1 cm incision was then made within the umbilicus and the varies needle easily introduced into the peritoneal cavity. Intraperitoneal placement was confirmed by aspiration and injection with normal saline. Gas flow was then applied and a pneumoperitoneum obtained with approximate 3 L of CO2 gas. The varies needle was then removed and a 10 mm optiview trocar was easily introduced into the abdomen under direct visualization. . With patient in Trendelenburg the uterus and tubes and ovaries were inspected with findings as previously stated. Two additional 53m trocars were placed in the upper lateral quadrants under direct visualization after injection with quarter percent marcaine.  The Harmonic scalpel was then utilized to transect the iinfundibulopelvic ligament on the right and  continue through the broad ligament and uteroovarian ligament until the entire adnexa was free.  This was placed in the posterior cul-de-sac. In a similar fashion the left adnexa was dissected free. THe scope was then changed to the 533mport and the endobag deployed in the 1063mort.  The adnexa were placed in the bag and then the bag and adnexa removed from the umbilicus with the trocar.   A four quadrant view of the pelvis and abdomen was performed and found to be normal with no bleeding or injuries noted.  The instruments were removed from the abdomen as well as the 5 mm lateral ports under visualization.  The pneumoperitoneum was reduced through the trocar. The trocar was finally removed and the infraumbilical incision and lateral incisions were closed with a 0-vicryl deep suture at the umbilical port and  subcuticular stitch of 3-0 Vicryl to close the skin at all others. . Dermabond and a bandage were placed.  The hulka tenaculum removed. Patient was then awakened and taken to the recovery room in good condition.

## 2016-05-16 NOTE — Discharge Instructions (Signed)
DISCHARGE INSTRUCTIONS: Laparoscopy  The following instructions have been prepared to help you care for yourself upon your return home today.  Wound care:  Do not get the incision wet for the first 24 hours. The incision should be kept clean and dry.  The Band-Aids or dressings may be removed the day after surgery.  Should the incision become sore, red, and swollen after the first week, check with your doctor.  Personal hygiene:  Shower the day after your procedure.  Activity and limitations:  Do NOT drive or operate any equipment today.  Do NOT lift anything more than 15 pounds for 2-3 weeks after surgery.  Do NOT rest in bed all day.  Walking is encouraged. Walk each day, starting slowly with 5-minute walks 3 or 4 times a day. Slowly increase the length of your walks.  Walk up and down stairs slowly.  Do NOT do strenuous activities, such as golfing, playing tennis, bowling, running, biking, weight lifting, gardening, mowing, or vacuuming for 2-4 weeks. Ask your doctor when it is okay to start.  Diet: Eat a light meal as desired this evening. You may resume your usual diet tomorrow.  Return to work: This is dependent on the type of work you do. For the most part you can return to a desk job within a week of surgery. If you are more active at work, please discuss this with your doctor.  What to expect after your surgery: You may have a slight burning sensation when you urinate on the first day. You may have a very small amount of blood in the urine. Expect to have a small amount of vaginal discharge/light bleeding for 1-2 weeks. It is not unusual to have abdominal soreness and bruising for up to 2 weeks. You may be tired and need more rest for about 1 week. You may experience shoulder pain for 24-72 hours. Lying flat in bed may relieve it.  Call your doctor for any of the following:  Develop a fever of 100.4 or greater  Inability to urinate 6 hours after discharge from  hospital  Severe pain not relieved by pain medications  Persistent of heavy bleeding at incision site  Redness or swelling around incision site after a week  Increasing nausea or vomiting   Post Anesthesia Home Care Instructions  Activity: Get plenty of rest for the remainder of the day. A responsible adult should stay with you for 24 hours following the procedure.  For the next 24 hours, DO NOT: -Drive a car -Paediatric nurse -Drink alcoholic beverages -Take any medication unless instructed by your physician -Make any legal decisions or sign important papers.  Meals: Start with liquid foods such as gelatin or soup. Progress to regular foods as tolerated. Avoid greasy, spicy, heavy foods. If nausea and/or vomiting occur, drink only clear liquids until the nausea and/or vomiting subsides. Call your physician if vomiting continues.  Special Instructions/Symptoms: Your throat may feel dry or sore from the anesthesia or the breathing tube placed in your throat during surgery. If this causes discomfort, gargle with warm salt water. The discomfort should disappear within 24 hours.

## 2016-05-16 NOTE — Anesthesia Procedure Notes (Signed)
Procedure Name: Intubation Date/Time: 05/16/2016 7:38 AM Performed by: Riki Sheer Pre-anesthesia Checklist: Patient identified, Emergency Drugs available, Suction available, Patient being monitored and Timeout performed Patient Re-evaluated:Patient Re-evaluated prior to inductionOxygen Delivery Method: Circle system utilized Preoxygenation: Pre-oxygenation with 100% oxygen Intubation Type: IV induction Ventilation: Mask ventilation without difficulty Laryngoscope Size: Miller, 2 and Glidescope Grade View: Grade III Tube type: Oral Tube size: 7.0 mm Number of attempts: 2 Airway Equipment and Method: Video-laryngoscopy Placement Confirmation: ETT inserted through vocal cords under direct vision,  positive ETCO2,  CO2 detector and breath sounds checked- equal and bilateral Secured at: 22 cm Tube secured with: Tape Dental Injury: Teeth and Oropharynx as per pre-operative assessment  Difficulty Due To: Difficulty was unanticipated, Difficult Airway- due to anterior larynx and Difficult Airway- due to limited oral opening Comments:  DLx1 with Miller 2, Grade 4 view. Glidescope used with grade 2 view. ETT placed under direct visualization. Pt easy mask airway with oral airway in place. Recommend go directly to Glidescope with next intubation

## 2016-05-18 ENCOUNTER — Encounter (HOSPITAL_COMMUNITY): Payer: Self-pay | Admitting: Obstetrics and Gynecology

## 2016-05-20 ENCOUNTER — Encounter: Payer: Self-pay | Admitting: Surgery

## 2016-05-21 ENCOUNTER — Telehealth: Payer: Self-pay | Admitting: Surgery

## 2016-05-21 NOTE — Telephone Encounter (Signed)
-----   Message from Mena Goes, RN sent at 05/20/2016  2:27 PM EDT ----- Regarding: RE: pt question I looked at it and I think he can use it. She is for a 3 year follow up and it has shrunk.  ----- Message ----- From: Georgiann Mccoy Sent: 05/20/2016   2:13 PM To: Mena Goes, RN Subject: pt question                                    This pt had a CT abd/pel w/ contrast in January of 2017. She wants to know if she has to repeat a CT. I told her that we needed at CTA but she does not want to have to do another scan.  Thank you, [name]

## 2016-05-21 NOTE — Telephone Encounter (Signed)
Spoke to pt to let her know

## 2016-05-22 ENCOUNTER — Encounter: Payer: Self-pay | Admitting: Hematology and Oncology

## 2016-05-22 ENCOUNTER — Ambulatory Visit (HOSPITAL_BASED_OUTPATIENT_CLINIC_OR_DEPARTMENT_OTHER): Payer: Medicare Other | Admitting: Hematology and Oncology

## 2016-05-22 DIAGNOSIS — C50412 Malignant neoplasm of upper-outer quadrant of left female breast: Secondary | ICD-10-CM

## 2016-05-22 DIAGNOSIS — M8580 Other specified disorders of bone density and structure, unspecified site: Secondary | ICD-10-CM

## 2016-05-22 MED ORDER — ANASTROZOLE 1 MG PO TABS: 1.0000 mg | ORAL_TABLET | Freq: Every day | ORAL | 3 refills | Status: DC

## 2016-05-22 MED ORDER — ALENDRONATE SODIUM 70 MG PO TABS: 70.0000 mg | ORAL_TABLET | ORAL | 3 refills | Status: DC

## 2016-05-22 NOTE — Progress Notes (Signed)
Patient Care Team: Unk Pinto, MD as PCP - General (Internal Medicine) Ralene Bathe, MD as Consulting Physician (Ophthalmology) Serafina Mitchell, MD as Consulting Physician (Vascular Surgery) Carol Ada, MD as Consulting Physician (Gastroenterology) Amy Martinique, MD as Consulting Physician (Dermatology) Nicholas Lose, MD as Consulting Physician (Hematology and Oncology) Excell Seltzer, MD as Consulting Physician (General Surgery)  DIAGNOSIS: No matching staging information was found for the patient.  SUMMARY OF ONCOLOGIC HISTORY:   Breast cancer of upper-outer quadrant of left female breast ([place])   08/03/2015 Mammogram    Left Breast: 6 mm mass @ 1 o clock      08/09/2015 Initial Diagnosis    Left breast 1:00 position: Invasive ductal carcinoma grade 1-2, ER 100%, PR 10%, HER-2 negative ratio 1.79, Ki-67 15%, T1 BN 0 stage I a clinical stage      08/29/2015 Surgery    Left lumpectomy: invasive ductal carcinoma 1.2 cm with LVID, with DCIS intermediate grade, 1 sentinel node positive, ER 100%, PR 10%, HER-2 negative ratio 1.79, Ki-67 15% , margins negative, Mammaprint high risk, luminal B, T1cN1 stage II a      10/11/2015 - 02/21/2016 Chemotherapy    Adjuvant chemotherapy with dose dense Adriamycin and Cytoxan 4 followed by Abraxane weekly 12      10/23/2015 Procedure    BRCA2 mutation      03/27/2016 - 04/23/2016 Radiation Therapy    Adjuvant radiation therapy      05/16/2016 Surgery    Bilateral salpingo-oophorectomies: No cancer      05/22/2016 -  Anti-estrogen oral therapy    Anastrozole 1 mg by mouth daily       CHIEF COMPLIANT: Patient is here to start antiestrogen therapy with anastrozole  INTERVAL HISTORY: Jacqueline Jenkins is a 65 year old with above-mentioned history of left breast cancer treated with lumpectomy followed by adjuvant chemotherapy and radiation. She underwent recent bilateral salpingo-oophorectomy sore BRCA2 mutation. She is here today to  discuss a treatment plan. She is recovering very well from the effects of radiation. She is doing quite well from the recent bilateral salpingo-oophorectomies.  REVIEW OF SYSTEMS:   Constitutional: Denies fevers, chills or abnormal weight loss Eyes: Denies blurriness of vision Ears, nose, mouth, throat, and face: Denies mucositis or sore throat Respiratory: Denies cough, dyspnea or wheezes Cardiovascular: Denies palpitation, chest discomfort Gastrointestinal:  Recent bilateral salpingo-oophorectomy Skin: Denies abnormal skin rashes Lymphatics: Denies new lymphadenopathy or easy bruising Neurological:Denies numbness, tingling or new weaknesses Behavioral/Psych: Mood is stable, no new changes  Extremities: No lower extremity edema Breast:  denies any pain or lumps or nodules in either breasts All other systems were reviewed with the patient and are negative.  I have reviewed the past medical history, past surgical history, social history and family history with the patient and they are unchanged from previous note.  ALLERGIES:  is allergic to lescol [fluvastatin sodium]; lipitor [atorvastatin]; and vytorin [ezetimibe-simvastatin].  MEDICATIONS:  Current Outpatient Prescriptions  Medication Sig Dispense Refill  . anastrozole (ARIMIDEX) 1 MG tablet Take 1 tablet (1 mg total) by mouth daily. 90 tablet 3  . benazepril-hydrochlorthiazide (LOTENSIN HCT) 20-12.5 MG tablet TAKE 1 TABLET BY MOUTH DAILY. 90 tablet 1  . ibuprofen (ADVIL) 200 MG tablet Take 3 tablets (600 mg total) by mouth every 6 (six) hours as needed. 30 tablet 0   No current facility-administered medications for this visit.    Facility-Administered Medications Ordered in Other Visits  Medication Dose Route Frequency Provider Last Rate Last Dose  .  sodium chloride flush (NS) 0.9 % injection 10 mL  10 mL Intracatheter PRN Nicholas Lose, MD   10 mL at 12/20/15 1605    PHYSICAL EXAMINATION: ECOG PERFORMANCE STATUS: 1 -  Symptomatic but completely ambulatory  Vitals:   05/22/16 1332  BP: 136/75  Pulse: 81  Resp: 18  Temp: 98.7 F (37.1 C)   Filed Weights   05/22/16 1332  Weight: 157 lb 9.6 oz (71.5 kg)    GENERAL:alert, no distress and comfortable SKIN: skin color, texture, turgor are normal, no rashes or significant lesions EYES: normal, Conjunctiva are pink and non-injected, sclera clear OROPHARYNX:no exudate, no erythema and lips, buccal mucosa, and tongue normal  NECK: supple, thyroid normal size, non-tender, without nodularity LYMPH:  no palpable lymphadenopathy in the cervical, axillary or inguinal LUNGS: clear to auscultation and percussion with normal breathing effort HEART: regular rate & rhythm and no murmurs and no lower extremity edema ABDOMEN:abdomen soft, non-tender and normal bowel sounds MUSCULOSKELETAL:no cyanosis of digits and no clubbing  NEURO: alert & oriented x 3 with fluent speech, no focal motor/sensory deficits EXTREMITIES: No lower extremity edema  LABORATORY DATA:  I have reviewed the data as listed   Chemistry      Component Value Date/Time   NA 133 (L) 05/09/2016 1000   NA 137 02/21/2016 1037   K 4.4 05/09/2016 1000   K 4.7 02/21/2016 1037   CL 100 (L) 05/09/2016 1000   CO2 26 05/09/2016 1000   CO2 26 02/21/2016 1037   BUN 13 05/09/2016 1000   BUN 15.8 02/21/2016 1037   CREATININE 0.68 05/09/2016 1000   CREATININE 0.8 02/21/2016 1037      Component Value Date/Time   CALCIUM 9.6 05/09/2016 1000   CALCIUM 9.9 02/21/2016 1037   ALKPHOS 54 02/21/2016 1037   AST 18 02/21/2016 1037   ALT 17 02/21/2016 1037   BILITOT 0.38 02/21/2016 1037       Lab Results  Component Value Date   WBC 4.0 05/09/2016   HGB 12.6 05/09/2016   HCT 37.8 05/09/2016   MCV 91.1 05/09/2016   PLT 249 05/09/2016   NEUTROABS 2.9 02/21/2016     ASSESSMENT & PLAN:  Breast cancer of upper-outer quadrant of left female breast ([place]) Left lumpectomy 08/29/2015: Invasive ductal  carcinoma 1.2 cm with LVID, with DCIS intermediate grade, 1 sentinel node positive, ER 100%, PR 10%, HER-2 negative ratio 1.79, Ki-67 15% , margins negative, Mammaprint high risk, luminal B, T1cN1 stage II a. Patient does not need axillary lymph node dissection based on current NCCN guidelines and ACOSOG Z 11 clinical trial; luminal type B and high-risk phenotype. Risk of relapse without chemotherapy 24% versus 12% with chemotherapy.  Treatment summary: 1. Dose dense Adriamycin and Cytoxan 4 followed by Abraxane weekly 12  started 10/11/2015 completed 02/21/2016 2. Adjuvant radiation therapy started 03/27/2016 completed 04/23/2016 3. Adjuvant antiestrogen therapy started 05/22/2016 ----------------------------------------------------------------------------------------------------------------- Current treatment: Antiestrogen therapy with anastrozole 1 mg dailyto start 06/01/2016 BRCA2 mutation: Oophorectomy 05/16/2016: No malignancy  Anastrozole counseling: We discussed the risks and benefits of anti-estrogen therapy with aromatase inhibitors. These include but not limited to insomnia, hot flashes, mood changes, vaginal dryness, bone density loss, and weight gain. We strongly believe that the benefits far outweigh the risks. Patient understands these risks and consented to starting treatment. Planned treatment duration is 5-10 years.   Osteopenia:  I discussed with the patient that she has osteopenia and that I would start her on Fosamax along with calcium and vitamin D.  Bone density done on November 2016 revealed a T score of -2.1  Surveillance of breast cancer: 1. Mammograms will be set up for November 2017 2. patient will need a breast MRI probably in May 2018. (Because of BRCA2 mutation)  Return to clinic in 4 months for toxicity check and follow-up   Orders Placed This Encounter  Procedures  . MM DIAG BREAST TOMO BILATERAL    Standing Status:   Future    Standing Expiration Date:    07/22/2017    Order Specific Question:   Reason for Exam (SYMPTOM  OR DIAGNOSIS REQUIRED)    Answer:   Breast cancer history    Order Specific Question:   Preferred imaging location?    Answer:   [facility]   The patient has a good understanding of the overall plan. she agrees with it. she will call with any problems that may develop before the next visit here.   Rulon Eisenmenger, MD 05/22/16

## 2016-05-22 NOTE — Assessment & Plan Note (Signed)
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 Patient does not need axillary lymph node dissection based on current NCCN guidelines and ACOSOG Z 11 clinical trial; luminal type B and high-risk phenotype. Risk of relapse without chemotherapy 24% versus 12% with chemotherapy.  Treatment summary: 1. Dose dense Adriamycin and Cytoxan 4 followed by Abraxane weekly 12 started 10/11/2015 completed 02/21/2016 2. Adjuvant radiation therapy started 03/27/2016 completed 04/23/2016 3. Adjuvant antiestrogen therapy started 05/22/2016 ----------------------------------------------------------------------------------------------------------------- Current treatment: Antiestrogen therapy with anastrozole 1 mg daily BRCA2 mutation: Oophorectomy 05/16/2016: No malignancy  Anastrozole counseling: We discussed the risks and benefits of anti-estrogen therapy with aromatase inhibitors. These include but not limited to insomnia, hot flashes, mood changes, vaginal dryness, bone density loss, and weight gain. We strongly believe that the benefits far outweigh the risks. Patient understands these risks and consented to starting treatment. Planned treatment duration is 5-10 years.  Return to clinic in 3 months for toxicity check and follow-up

## 2016-05-26 ENCOUNTER — Ambulatory Visit (INDEPENDENT_AMBULATORY_CARE_PROVIDER_SITE_OTHER): Payer: Medicare Other | Admitting: Surgery

## 2016-05-26 ENCOUNTER — Encounter: Payer: Self-pay | Admitting: Surgery

## 2016-05-26 VITALS — BP 133/76 | HR 74 | Temp 98.9°F | Resp 16 | Ht 65.5 in | Wt 158.1 lb

## 2016-05-26 DIAGNOSIS — I728 Aneurysm of other specified arteries: Secondary | ICD-10-CM | POA: Diagnosis not present

## 2016-05-26 NOTE — Progress Notes (Signed)
[facility] [place]  Patient name: Jacqueline Jenkins MRN: [id number] DOB: 1950-11-15 Sex: female  REASON FOR VISIT: Follow-up splenic aneurysm  HPI: Jacqueline Jenkins is a 65 y.o. female returns today for follow-up.  She has a history of a large 5 cm splenic artery aneurysm.  She underwent: Embolization in June 2013.  Since I last saw her she has undergone treatment for breast cancer.  Past Medical History:  Diagnosis Date  . Allergic rhinitis, cause unspecified   . Anemia    history of anemia while teenager  . BRCA2 positive   . Breast cancer of upper-outer quadrant of left female breast ([place])    chemo complete 01/2016, radiation 04/2016  . DDD (degenerative disc disease)   . Hyperlipidemia   . Hypertension   . Osteopenia 12/2011   Spine T -0.4, Femur -2.1  . PONV (postoperative nausea and vomiting)   . Splenic artery aneurysm ([place]) 2013   COILS DONE  . Vitamin D deficiency     Family History  Problem Relation Age of Onset  . Stroke Mother   . Osteoporosis Mother   . Hyperlipidemia Mother   . Hypertension Mother   . Other Mother     varicose veins  . Deep vein thrombosis Mother   . Diverticulitis Mother   . Breast cancer Maternal Grandmother     dx. 30s-40s; when pt's mother was 87 years old  . Other Father     bleeding problems  . Hypertension Father   . Heart attack Father   . Stroke Father   . Other Sister     one sister had a hysterectomy for fibroids  . Leukemia Maternal Aunt     dx. 50s-60s  . Pancreatic cancer Maternal Uncle     dx. late 60s-early 70s; (x2 maternal uncles)  . Heart attack Paternal Aunt   . Stroke Paternal Aunt   . Stroke Maternal Grandfather   . Heart attack Paternal Grandmother   . Stroke Paternal Grandmother   . Heart Problems Paternal Grandfather   . Colon cancer Maternal Uncle     dx. late 47s  . Heart defect Maternal Aunt     possible  . Breast cancer Cousin     dx. 50s-60s;  maternal first  . Heart attack Paternal Uncle   . Stroke Paternal Uncle   . Lung cancer Maternal Uncle     treated at [facility]; d. 57s    SOCIAL HISTORY: Social History  Substance Use Topics  . Smoking status: Never Smoker  . Smokeless tobacco: Never Used  . Alcohol use Yes     Comment: 3-4 times per year    Allergies  Allergen Reactions  . Lescol [Fluvastatin Sodium]     Myalgia  . Lipitor [Atorvastatin]     Increased LFT's  . Vytorin [Ezetimibe-Simvastatin]     myalgia    Current Outpatient Prescriptions  Medication Sig Dispense Refill  . benazepril-hydrochlorthiazide (LOTENSIN HCT) 20-12.5 MG tablet TAKE 1 TABLET BY MOUTH DAILY. 90 tablet 1  . alendronate (FOSAMAX) 70 MG tablet Take 1 tablet (70 mg total) by mouth once a week. Take with a full glass of water on an empty stomach. (Patient not taking: Reported on 05/26/2016) 12 tablet 3  . anastrozole (ARIMIDEX) 1 MG tablet Take 1 tablet (1 mg total) by mouth daily. (Patient not taking: Reported on 05/26/2016) 90 tablet 3  . ibuprofen (ADVIL) 200 MG tablet Take 3 tablets (600 mg total) by mouth every  6 (six) hours as needed. (Patient not taking: Reported on 05/26/2016) 30 tablet 0   No current facility-administered medications for this visit.    Facility-Administered Medications Ordered in Other Visits  Medication Dose Route Frequency Provider Last Rate Last Dose  . sodium chloride flush (NS) 0.9 % injection 10 mL  10 mL Intracatheter PRN Nicholas Lose, MD   10 mL at 12/20/15 1605    REVIEW OF SYSTEMS:  _0  denotes positive finding, _1  denotes negative finding Cardiac  Comments:  Chest pain or chest pressure:    Shortness of breath upon exertion:    Short of breath when lying flat:    Irregular heart rhythm:        Vascular    Pain in calf, thigh, or hip brought on by ambulation:    Pain in feet at night that wakes you up from your sleep:     Blood clot in your veins:    Leg swelling:         Pulmonary    Oxygen at  home:    Productive cough:     Wheezing:         Neurologic    Sudden weakness in arms or legs:     Sudden numbness in arms or legs:     Sudden onset of difficulty speaking or slurred speech:    Temporary loss of vision in one eye:     Problems with dizziness:         Gastrointestinal    Blood in stool:     Vomited blood:         Genitourinary    Burning when urinating:     Blood in urine:        Psychiatric    Major depression:         Hematologic    Bleeding problems:    Problems with blood clotting too easily:        Skin    Rashes or ulcers:        Constitutional    Fever or chills:      PHYSICAL EXAM: Vitals:   05/26/16 1140  BP: 133/76  Pulse: 74  Resp: 16  Temp: 98.9 F (37.2 C)  TempSrc: Oral  SpO2: 98%  Weight: 158 lb 1.6 oz (71.7 kg)  Height: 5' 5.5" (1.664 m)    GENERAL: The patient is a well-nourished female, in no acute distress. The vital signs are documented above. CARDIAC: There is a regular rate and rhythm.  PULMONARY: There is good air exchange bilaterally without wheezing or rales. ABDOMEN: Soft and non-tender with normal pitched bowel sounds.  NEUROLOGIC: No focal weakness or paresthesias are detected. SKIN: There are no ulcers or rashes noted. PSYCHIATRIC: The patient has a normal affect.  DATA:  I have reviewed her most recent CT scan which was from January, 2017.  This shows a decrease in the size of her aneurysm with maximum diameter now being 4.1 cm  MEDICAL ISSUES: Splenic artery aneurysm:  Status post corneal embolization.  She has seen a decrease in the size of her aneurysm diameter, and now measures 4.1 cm.  Previously it was 5 cm.  Patient will be scheduled for follow-up ultrasound for aneurysm evaluation in 3 years    Annamarie Major, MD [facility] [phone] Pager [phone]

## 2016-06-02 DIAGNOSIS — Z23 Encounter for immunization: Secondary | ICD-10-CM | POA: Diagnosis not present

## 2016-06-10 ENCOUNTER — Ambulatory Visit
Admission: RE | Admit: 2016-06-10 | Discharge: 2016-06-10 | Disposition: A | Discharge status: Home / Self Care | Payer: Medicare Other | Source: Ambulatory Visit | Attending: Radiation Oncology | Admitting: Radiation Oncology

## 2016-06-10 ENCOUNTER — Encounter: Payer: Self-pay | Admitting: Radiation Oncology

## 2016-06-10 DIAGNOSIS — C50912 Malignant neoplasm of unspecified site of left female breast: Secondary | ICD-10-CM | POA: Insufficient documentation

## 2016-06-10 DIAGNOSIS — Z79899 Other long term (current) drug therapy: Secondary | ICD-10-CM | POA: Diagnosis not present

## 2016-06-10 DIAGNOSIS — C50412 Malignant neoplasm of upper-outer quadrant of left female breast: Secondary | ICD-10-CM

## 2016-06-10 DIAGNOSIS — Z170 Estrogen receptor positive status [ER+]: Secondary | ICD-10-CM | POA: Diagnosis not present

## 2016-06-10 DIAGNOSIS — Z923 Personal history of irradiation: Secondary | ICD-10-CM | POA: Insufficient documentation

## 2016-06-10 DIAGNOSIS — Z888 Allergy status to other drugs, medicaments and biological substances status: Secondary | ICD-10-CM | POA: Diagnosis not present

## 2016-06-10 NOTE — Progress Notes (Signed)
Radiation Oncology         [phone]) [phone] ________________________________  Name: NATALE BARBA MRN: [id number]  Date: 06/10/2016  DOB: 01/22/1951  Post Treatment Note  CC: Alesia Richards, MD  Nicholas Lose, MD  Diagnosis:   Stage IIA, T1cN1, ER/PR positive invasive ductal carcinoma of the left breast  Interval Since Last Radiation:  7 weeks   03/27/2016 through 04/23/2016: The patient initially received a dose of 42.5 Gy in 17 fractions to the breast using whole-breast tangent fields. This was delivered using a 3-D conformal technique. The patient then received a boost to the seroma. This delivered an additional 7.5 Gy in 3 fractions using a 3 field photon technique due to the depth of the seroma. The total dose was 50 Gy.   Narrative:  The patient returns today for post treatment follow up. She did not experience any significant skin desquamation during treatment.                         On review of systems, the patient states she is doing well overall. She denies any chest pain or breast pain. She does not have any concerns with her anastrozole use but just started this yesterday. No other complaints are verbalized.  ALLERGIES:  is allergic to lescol [fluvastatin sodium]; lipitor [atorvastatin]; and vytorin [ezetimibe-simvastatin].  Meds: Current Outpatient Prescriptions  Medication Sig Dispense Refill  . anastrozole (ARIMIDEX) 1 MG tablet Take 1 tablet (1 mg total) by mouth daily. 90 tablet 3  . benazepril-hydrochlorthiazide (LOTENSIN HCT) 20-12.5 MG tablet TAKE 1 TABLET BY MOUTH DAILY. 90 tablet 1  . ibuprofen (ADVIL) 200 MG tablet Take 3 tablets (600 mg total) by mouth every 6 (six) hours as needed. 30 tablet 0  . alendronate (FOSAMAX) 70 MG tablet Take 1 tablet (70 mg total) by mouth once a week. Take with a full glass of water on an empty stomach. (Patient not taking: Reported on 06/10/2016) 12 tablet 3   No current facility-administered medications for this encounter.      Facility-Administered Medications Ordered in Other Encounters  Medication Dose Route Frequency Provider Last Rate Last Dose  . sodium chloride flush (NS) 0.9 % injection 10 mL  10 mL Intracatheter PRN Nicholas Lose, MD   10 mL at 12/20/15 1605    Physical Findings:  height is 5' 5.5" (1.664 m) and weight is 159 lb 12.8 oz (72.5 kg). Her oral temperature is 98.2 F (36.8 C). Her blood pressure is 113/56 (abnormal) and her pulse is 80.  In general this is a well appearing Caucasian female in no acute distress. she's alert and oriented x4 and appropriate throughout the examination. Cardiopulmonary assessment is negative for acute distress and she exhibits normal effort. The left breast is intact with minimal hyperpigmentation. No desquamation is noted.  Lab Findings: Lab Results  Component Value Date   WBC 4.0 05/09/2016   HGB 12.6 05/09/2016   HCT 37.8 05/09/2016   MCV 91.1 05/09/2016   PLT 249 05/09/2016     Radiographic Findings: No results found.  Impression/Plan: 1. Stage IIA, T1cN1, ER/PR positive invasive ductal carcinoma of the left breast. The patient appears to be doing well since radiation completed. She will have a mammogram in November, and MRI of the breast due to her BRCA mutation in May 2018. 2. BRCA mutation. The patient has already undergone risk reduction oophorectomy in September and will be followed with semiannual breast imaging.      Bryson Ha  Margurite Auerbach, PAC

## 2016-06-10 NOTE — Progress Notes (Signed)
Ms. Eastland has returned for FU S/P XRT to her left breast.  She reports occassional shooting pains in her breast. She also reports 'slight" redness of her anterior breast and tanning in the inframmary fold.  She reports decreased fatigue. Started Anastrozole on 06/09/16.  Survivorship encounter 07/11/16   BP (!) 113/56   Pulse 80   Temp 98.2 F (36.8 C) (Oral)   Ht 5' 5.5" (1.664 m)   Wt 159 lb 12.8 oz (72.5 kg)   BMI 26.19 kg/m    Wt Readings from Last 3 Encounters:  06/10/16 159 lb 12.8 oz (72.5 kg)  05/26/16 158 lb 1.6 oz (71.7 kg)  05/22/16 157 lb 9.6 oz (71.5 kg)

## 2016-07-10 NOTE — Progress Notes (Signed)
CLINIC:  Survivorship   REASON FOR VISIT:  Routine follow-up post-treatment for a recent history of breast cancer.  BRIEF ONCOLOGIC HISTORY:    Breast cancer of upper-outer quadrant of left female breast ([place])   08/03/2015 Mammogram    Left Breast: 6 mm mass @ 1 o clock      08/09/2015 Initial Diagnosis    Left breast 1:00 position: Invasive ductal carcinoma grade 1-2, ER 100%, PR 10%, HER-2 negative ratio 1.79, Ki-67 15%, T1 BN 0 stage I a clinical stage      08/29/2015 Surgery    Left lumpectomy ([facility]): invasive ductal carcinoma 1.2 cm with LVID, with DCIS intermediate grade, 1 sentinel node positive, ER 100%, PR 10%, HER-2 negative ratio 1.79, Ki-67 15% , margins negative, Mammaprint high risk, luminal B, T1cN1 stage II a      08/29/2015 Procedure    Mammaprint: High-risk, Luminal type      09/07/2015 Surgery    Left breast re-excision ([facility]): ADH, no malignancy, negative margins.       09/12/2015 Procedure    BRCA2 mutation "[id number]." Also VUS on CHEK2.  Otherwise negative. Genes analyzed:  ATM, BARD1, BRCA1, BRCA2, BRIP1, CDH1, CHEK2, FANCC, MLH1, MSH2, MSH6, NBN, PALB2, PMS2, PTEN, RAD51C, RAD51D, TP53, and XRCC2; deletion/duplication analysis (without sequencing) for EPCAM.      10/11/2015 - 02/21/2016 Chemotherapy    Adjuvant chemotherapy with dose dense Adriamycin and Cytoxan 4 followed by Abraxane weekly 12      03/27/2016 - 04/23/2016 Radiation Therapy    Adjuvant radiation therapy Lisbeth Renshaw). Left breast: 42.5 Gy in 17 fractions. Left breast boost: 7.5 Gy in 3 fractions.       05/16/2016 Surgery    Bilateral salpingo-oophorectomies: No cancer      06/05/2016 -  Anti-estrogen oral therapy    Anastrozole 1 mg by mouth daily        INTERVAL HISTORY:  Ms. Segel presents to the [facility] today for our initial meeting to review her survivorship care plan detailing her treatment course for breast cancer, as well as monitoring long-term  side effects of that treatment, education regarding health maintenance, screening, and overall wellness and health promotion.     She started the anastrozole in early October. She denies any hot flashes or vaginal dryness. She endorses arthralgias, particularly to her shoulders and knees, which are "a little worse than they were before." The joint pains do not require medication for management of the pain. She doesn't sleep well; she tells me that her "mind races at night"; she does not take any medications for this and is not really interested in taking anything else right now.  She denies any peripheral neuropathy since completing chemotherapy. She completed radiation therapy about 2.5 months ago; she tells me her skin has healed well.  She also recently underwent bilateral salpingo-oopherectomy in 05/2016 for risk reduction for her BCRA2 genetic mutation; she tells me that she recovered well from that surgery.  Ms. Ahlgren' husband has Parkinson's disease and she is the primary caregiver for him.  Her daughter that lives locally also helps care for him.    REVIEW OF SYSTEMS:  Review of Systems  Constitutional: Negative.  Negative for fatigue.  HENT:  Negative.   Eyes: Negative.   Respiratory: Negative.   Cardiovascular: Negative.   Gastrointestinal: Negative.   Endocrine: Negative.  Negative for hot flashes.  Genitourinary: Negative.  Negative for vaginal bleeding.   Musculoskeletal: Positive for arthralgias.  Skin: Negative.   Neurological: Negative.  Hematological: Negative.   Psychiatric/Behavioral: Positive for sleep disturbance.  Breast: Denies any new nodularity, masses, tenderness, nipple changes, or nipple discharge.    A 14-point review of systems was completed and was negative, except as noted above.   ONCOLOGY TREATMENT TEAM:  1. Surgeon:  Dr. Saddie Benders at [facility] 2. Medical Oncologist: Dr. Lindi Adie 3. Radiation Oncologist: Dr. Lisbeth Renshaw    PAST  MEDICAL/SURGICAL HISTORY:  Past Medical History:  Diagnosis Date  . Allergic rhinitis, cause unspecified   . Anemia    history of anemia while teenager  . BRCA2 positive   . Breast cancer of upper-outer quadrant of left female breast ([place])    chemo complete 01/2016, radiation 04/2016  . DDD (degenerative disc disease)   . Hyperlipidemia   . Hypertension   . Osteopenia 12/2011   Spine T -0.4, Femur -2.1  . PONV (postoperative nausea and vomiting)   . Splenic artery aneurysm ([place]) 2013   COILS DONE  . Vitamin D deficiency    Past Surgical History:  Procedure Laterality Date  . BREAST LUMPECTOMY WITH RADIOACTIVE SEED AND SENTINEL LYMPH NODE BIOPSY Left 08/29/2015   Procedure: LEFT BREAST LUMPECTOMY WITH RADIOACTIVE SEED WITH AXILLARY SENTINEL LYMPH NODE BIOPSY;  Surgeon: Excell Seltzer, MD;  Location: [facility];  Service: General;  Laterality: Left;  . [place]  . COLONOSCOPY  Jan. 7, 2014  . LAPAROSCOPIC BILATERAL SALPINGO OOPHERECTOMY Bilateral 05/16/2016   Procedure: LAPAROSCOPIC BILATERAL SALPINGO OOPHORECTOMY;  Surgeon: Paula Compton, MD;  Location: [facility];  Service: Gynecology;  Laterality: Bilateral;  . [place] SURGERY  2001, 2010  . PORTACATH PLACEMENT Bilateral 10/09/2015   Procedure: INSERTION PORT-A-CATH;  Surgeon: Excell Seltzer, MD;  Location: [facility];  Service: General;  Laterality: Bilateral;  . Portacath removal    . RE-EXCISION OF BREAST LUMPECTOMY Left 09/07/2015   Procedure: RE-EXCISION OF LEFT BREAST LUMPECTOMY;  Surgeon: Excell Seltzer, MD;  Location: [facility];  Service: General;  Laterality: Left;  . SPINE SURGERY  1998   cervical diskectomy  . SPINE SURGERY  2001, 2010   Lumbar diskectomy  . splenic aneurysm  02/10/2012   Aneurysm of splenic artery  . TONSILLECTOMY    . VISCERAL ANGIOGRAM N/A 02/10/2012   Procedure: VISCERAL ANGIOGRAM;  Surgeon: Serafina Mitchell, MD;  Location: [facility] CATH LAB;  Service:  Cardiovascular;  Laterality: N/A;     ALLERGIES:  Allergies  Allergen Reactions  . Lescol [Fluvastatin Sodium]     Myalgia  . Lipitor [Atorvastatin]     Increased LFT's  . Vytorin [Ezetimibe-Simvastatin]     myalgia     CURRENT MEDICATIONS:  Outpatient Encounter Prescriptions as of 07/11/2016  Medication Sig  . alendronate (FOSAMAX) 70 MG tablet Take 1 tablet (70 mg total) by mouth once a week. Take with a full glass of water on an empty stomach. (Patient not taking: Reported on 06/10/2016)  . anastrozole (ARIMIDEX) 1 MG tablet Take 1 tablet (1 mg total) by mouth daily.  . benazepril-hydrochlorthiazide (LOTENSIN HCT) 20-12.5 MG tablet TAKE 1 TABLET BY MOUTH DAILY.  Marland Kitchen ibuprofen (ADVIL) 200 MG tablet Take 3 tablets (600 mg total) by mouth every 6 (six) hours as needed.   Facility-Administered Encounter Medications as of 07/11/2016  Medication  . sodium chloride flush (NS) 0.9 % injection 10 mL     ONCOLOGIC FAMILY HISTORY:  Family History  Problem Relation Age of Onset  . Stroke Mother   . Osteoporosis Mother   .  Hyperlipidemia Mother   . Hypertension Mother   . Other Mother     varicose veins  . Deep vein thrombosis Mother   . Diverticulitis Mother   . Breast cancer Maternal Grandmother     dx. 30s-40s; when pt's mother was 71 years old  . Other Father     bleeding problems  . Hypertension Father   . Heart attack Father   . Stroke Father   . Other Sister     one sister had a hysterectomy for fibroids  . Leukemia Maternal Aunt     dx. 50s-60s  . Pancreatic cancer Maternal Uncle     dx. late 60s-early 70s; (x2 maternal uncles)  . Heart attack Paternal Aunt   . Stroke Paternal Aunt   . Stroke Maternal Grandfather   . Heart attack Paternal Grandmother   . Stroke Paternal Grandmother   . Heart Problems Paternal Grandfather   . Colon cancer Maternal Uncle     dx. late 76s  . Heart defect Maternal Aunt     possible  . Breast cancer Cousin     dx. 50s-60s;  maternal first  . Heart attack Paternal Uncle   . Stroke Paternal Uncle   . Lung cancer Maternal Uncle     treated at [facility]; d. 34s     GENETIC COUNSELING/TESTING: 09/12/15-BRCA2 mutation "[phone]." Also VUS on CHEK2.  Otherwise negative. Genes analyzed:  ATM, BARD1, BRCA1, BRCA2, BRIP1, CDH1, CHEK2, FANCC, MLH1, MSH2, MSH6, NBN, PALB2, PMS2, PTEN, RAD51C, RAD51D, TP53, and XRCC2; deletion/duplication analysis (without sequencing) for EPCAM.   SOCIAL HISTORY:  BRINDLE LEYBA is married and lives with her husband in [place], [place].  Her husband has Parkinson's disease and she is the primary caregiver for him.  They have 2 daughters; 1 lives in [place] and the other lives in [place].  She tells me the daughter that lives in [place] is planning to relocate to [facility] soon, and she is looking forward to this.  Ms. Gavel has 3 grandchildren (2 granddaughters & 1 grandson).  She denies any current  tobacco, alcohol, or illicit drug use.     PHYSICAL EXAMINATION:  Vital Signs: Vitals:   07/11/16 1056  BP: 121/71  Pulse: 77  Resp: 18  Temp: 98.2 F (36.8 C)   Filed Weights   07/11/16 1056  Weight: 160 lb 6.4 oz (72.8 kg)   General: Well-nourished, well-appearing female in no acute distress.  She is unaccompanied today.   HEENT: Head is normocephalic.  Pupils equal and reactive to light. Conjunctivae clear without exudate.  Sclerae anicteric. Oral mucosa is pink, moist.  Oropharynx is pink without lesions or erythema.  Lymph: No cervical, supraclavicular, or infraclavicular lymphadenopathy noted on palpation.  Cardiovascular: Regular rate and rhythm.Marland Kitchen Respiratory: Clear to auscultation bilaterally. Chest expansion symmetric; breathing non-labored.  GI: Abdomen soft and round; non-tender, non-distended. Bowel sounds normoactive.  GU: Deferred.  Neuro: No focal deficits. Steady gait.  Psych: Mood and affect normal and appropriate for situation.  Extremities: No edema. Skin: Warm  and dry.  LABORATORY DATA:  None for this visit.  DIAGNOSTIC IMAGING:  None for this visit.      ASSESSMENT AND PLAN:  Ms.. Heeg is a pleasant 65 y.o. female with Stage IIA left breast invasive ductal carcinoma, ER+/PR+/HER2-, diagnosed in 08/2015;  treated with lumpectomy/re-excision, adjuvant chemotherapy with Adriamycin/Cytoxan x 4, followed by Abraxane x 12; last chemo was on 02/21/16.  She went on adjuvant radiation therapy, which she completed on 04/23/16. She  started on anti-estrogen therapy with anastrozole beginning in 06/2016.  She presents to the Survivorship Clinic for our initial meeting and routine follow-up post-completion of treatment for breast cancer.    1. Stage IIA left breast cancer, BRCA2+:  Ms. Utke is continuing to recover from definitive treatment for breast cancer. She will follow-up with her medical oncologist, Dr. Lindi Adie, in 09/2016 with history and physical exam per surveillance protocol.  Given her BRCA2 mutation, Ms. Sindoni will have annual mammogram and annual breast MRI in 6 month intervals.  She will continue her anti-estrogen therapy with anastrozole. Thus far, she is tolerating the medication well, with minimal side effects. She understands that he can take up to 3 months for [place] to best understand how well she will tolerate the antiestrogen therapy going forward. Common side effects of anastrozole were again reviewed with her as well. Today, a comprehensive survivorship care plan and treatment summary was reviewed with the patient today detailing her breast cancer diagnosis, treatment course, potential late/long-term effects of treatment, appropriate follow-up care with recommendations for the future, and patient education resources.  A copy of this summary, along with a letter will be sent to the patient's primary care provider via mail/fax/In Basket message after today's visit.    2. Bone health:  Given Ms. 61 age, history of breast cancer, and her current  treatment regimen including anti-estrogen therapy with anastrozole she is at risk for bone demineralization.  Her last DEXA scan was 07/23/15 and showed osteopenia.  She is already on Fosamax therapy, which I encouraged her to continue. She was also encouraged to increase her consumption of foods rich in calcium, as well as increase her weight-bearing activities.  She was given education on specific activities to promote bone health.  3. Cancer screening:  Due to Ms. Gabrielle's history and her age, she should receive screening for skin cancers, colon cancer, and gynecologic cancers.  The information and recommendations are listed on the patient's comprehensive care plan/treatment summary and were reviewed in detail with the patient.    4. Health maintenance and wellness promotion: Ms. Whidbee was encouraged to consume 5-7 servings of fruits and vegetables per day. We reviewed the "Nutrition Rainbow" handout, as well as the handout "Take Control of Your Health and Reduce Your Cancer Risk" from the [facility].  She was also encouraged to engage in moderate to vigorous exercise for 30 minutes per day most days of the week. We discussed the [employer] fitness program, which is designed for cancer survivors to help them become more physically fit after cancer treatments.  She was instructed to limit her alcohol consumption and continue to abstain from tobacco use.   5. Support services/counseling: It is not uncommon for this period of the patient's cancer care trajectory to be one of many emotions and stressors.  We discussed an opportunity for her to participate in the next session of [facility] ("Finding Your New Normal") support group series designed for patients after they have completed treatment.   Ms. Ballo was encouraged to take advantage of our many other support services programs, support groups, and/or counseling in coping with her new life as a cancer survivor after completing anti-cancer  treatment.  She was offered support today through active listening and expressive supportive counseling.  She was given information regarding our available services and encouraged to contact me with any questions or for help enrolling in any of our support group/programs.    Dispo:   -Return to cancer center to  see Dr. Lindi Adie in 09/2016.  -She is welcome to return back to the Survivorship Clinic at any time; no additional follow-up needed at this time.  -Consider referral back to survivorship as a long-term survivor for continued surveillance  A total of 50 minutes of face-to-face time was spent with this patient with greater than 50% of that time in counseling and care-coordination.   Mike Craze, NP Survivorship Program [place] [phone]   Note: PRIMARY CARE PROVIDER Alesia Richards, [place] [phone]

## 2016-07-11 ENCOUNTER — Ambulatory Visit (HOSPITAL_BASED_OUTPATIENT_CLINIC_OR_DEPARTMENT_OTHER): Payer: Medicare Other | Admitting: Adult Health

## 2016-07-11 ENCOUNTER — Encounter: Payer: Self-pay | Admitting: Adult Health

## 2016-07-11 VITALS — BP 121/71 | HR 77 | Temp 98.2°F | Resp 18 | Ht 65.5 in | Wt 160.4 lb

## 2016-07-11 DIAGNOSIS — C50412 Malignant neoplasm of upper-outer quadrant of left female breast: Secondary | ICD-10-CM | POA: Diagnosis not present

## 2016-07-11 DIAGNOSIS — Z170 Estrogen receptor positive status [ER+]: Secondary | ICD-10-CM | POA: Diagnosis not present

## 2016-08-05 ENCOUNTER — Ambulatory Visit
Admission: RE | Admit: 2016-08-05 | Discharge: 2016-08-05 | Disposition: A | Discharge status: Home / Self Care | Payer: Medicare Other | Source: Ambulatory Visit | Attending: Hematology and Oncology | Admitting: Hematology and Oncology

## 2016-08-05 DIAGNOSIS — R921 Mammographic calcification found on diagnostic imaging of breast: Secondary | ICD-10-CM | POA: Diagnosis not present

## 2016-08-05 DIAGNOSIS — C50412 Malignant neoplasm of upper-outer quadrant of left female breast: Secondary | ICD-10-CM

## 2016-09-19 DIAGNOSIS — H01001 Unspecified blepharitis right upper eyelid: Secondary | ICD-10-CM | POA: Diagnosis not present

## 2016-09-19 DIAGNOSIS — H25813 Combined forms of age-related cataract, bilateral: Secondary | ICD-10-CM | POA: Diagnosis not present

## 2016-09-19 DIAGNOSIS — H01004 Unspecified blepharitis left upper eyelid: Secondary | ICD-10-CM | POA: Diagnosis not present

## 2016-09-21 NOTE — Assessment & Plan Note (Signed)
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 Patient does not need axillary lymph node dissection based on current NCCN guidelines and ACOSOG Z 11 clinical trial; luminal type B and high-risk phenotype. Risk of relapse without chemotherapy 24% versus 12% with chemotherapy.  Treatment summary: 1. Dose dense Adriamycin and Cytoxan 4 followed by Abraxane weekly 12  started 10/11/2015 completed 02/21/2016 2. Adjuvant radiation therapy started 03/27/2016 completed 04/23/2016 3. Adjuvant antiestrogen therapy started 05/22/2016 ----------------------------------------------------------------------------------------------------------------- Current treatment: Antiestrogen therapy with anastrozole 1 mg dailyto start 06/01/2016 BRCA2 mutation: Oophorectomy 05/16/2016: No malignancy  Anastrozole Toxicities:   Surveillance of breast cancer: 1. Mammograms November 2017 2. patient will need a breast MRI probably in May 2018. (Because of BRCA2 mutation)  Return to clinic in 6 months for toxicity check and follow-up

## 2016-09-22 ENCOUNTER — Encounter: Payer: Self-pay | Admitting: Hematology and Oncology

## 2016-09-22 ENCOUNTER — Ambulatory Visit (HOSPITAL_BASED_OUTPATIENT_CLINIC_OR_DEPARTMENT_OTHER): Payer: Medicare Other | Admitting: Hematology and Oncology

## 2016-09-22 VITALS — BP 114/62 | HR 78 | Temp 98.0°F | Resp 18 | Wt 165.5 lb

## 2016-09-22 DIAGNOSIS — Z170 Estrogen receptor positive status [ER+]: Secondary | ICD-10-CM | POA: Diagnosis not present

## 2016-09-22 DIAGNOSIS — Z1501 Genetic susceptibility to malignant neoplasm of breast: Secondary | ICD-10-CM

## 2016-09-22 DIAGNOSIS — Z1509 Genetic susceptibility to other malignant neoplasm: Principal | ICD-10-CM

## 2016-09-22 DIAGNOSIS — C50412 Malignant neoplasm of upper-outer quadrant of left female breast: Secondary | ICD-10-CM | POA: Diagnosis not present

## 2016-09-22 MED ORDER — AZITHROMYCIN 250 MG PO TABS: ORAL_TABLET | ORAL | 0 refills | Status: DC

## 2016-09-22 NOTE — Progress Notes (Signed)
Patient Care Team: Unk Pinto, MD as PCP - General (Internal Medicine) Ralene Bathe, MD as Consulting Physician (Ophthalmology) Serafina Mitchell, MD as Consulting Physician (Vascular Surgery) Carol Ada, MD as Consulting Physician (Gastroenterology) Amy Martinique, MD as Consulting Physician (Dermatology) Nicholas Lose, MD as Consulting Physician (Hematology and Oncology) Excell Seltzer, MD as Consulting Physician (General Surgery)  DIAGNOSIS:  Encounter Diagnosis  Name Primary?  . Malignant neoplasm of upper-outer quadrant of left breast in female, estrogen receptor positive ([place])     SUMMARY OF ONCOLOGIC HISTORY:   Breast cancer of upper-outer quadrant of left female breast ([place])   08/03/2015 Mammogram    Left Breast: 6 mm mass @ 1 o clock      08/09/2015 Initial Diagnosis    Left breast 1:00 position: Invasive ductal carcinoma grade 1-2, ER 100%, PR 10%, HER-2 negative ratio 1.79, Ki-67 15%, T1 BN 0 stage I a clinical stage      08/29/2015 Surgery    Left lumpectomy ([facility]): invasive ductal carcinoma 1.2 cm with LVID, with DCIS intermediate grade, 1 sentinel node positive, ER 100%, PR 10%, HER-2 negative ratio 1.79, Ki-67 15% , margins negative, Mammaprint high risk, luminal B, T1cN1 stage II a      08/29/2015 Procedure    Mammaprint: High-risk, Luminal type      09/07/2015 Surgery    Left breast re-excision ([facility]): ADH, no malignancy, negative margins.       09/12/2015 Procedure    BRCA2 mutation "[id number]." Also VUS on CHEK2.  Otherwise negative. Genes analyzed:  ATM, BARD1, BRCA1, BRCA2, BRIP1, CDH1, CHEK2, FANCC, MLH1, MSH2, MSH6, NBN, PALB2, PMS2, PTEN, RAD51C, RAD51D, TP53, and XRCC2; deletion/duplication analysis (without sequencing) for EPCAM.      10/11/2015 - 02/21/2016 Chemotherapy    Adjuvant chemotherapy with dose dense Adriamycin and Cytoxan 4 followed by Abraxane weekly 12      03/27/2016 - 04/23/2016 Radiation Therapy    Adjuvant  radiation therapy Lisbeth Renshaw). Left breast: 42.5 Gy in 17 fractions. Left breast boost: 7.5 Gy in 3 fractions.       05/16/2016 Surgery    Bilateral salpingo-oophorectomies: No cancer      06/05/2016 -  Anti-estrogen oral therapy    Anastrozole 1 mg by mouth daily       CHIEF COMPLIANT: Follow-up on anastrozole therapy  INTERVAL HISTORY: CHIRSTINE Jenkins is a 80 year with above-mentioned history of BRCA2 mutation positive left breast cancer treated with lumpectomy and adjuvant radiation. She had adjuvant chemotherapy as well. She is currently on anastrozole and tolerating it fairly well. Were the past 10 days she has had right ear fullness and throbbing sensation that wakes her up at night. She feels that it is like a tinnitus. Denies any dizziness or lightheadedness.  REVIEW OF SYSTEMS:   Constitutional: Denies fevers, chills or abnormal weight loss Eyes: Denies blurriness of vision Ears, nose, mouth, throat, and face: Denies mucositis or sore throat Respiratory: Denies cough, dyspnea or wheezes Cardiovascular: Denies palpitation, chest discomfort Gastrointestinal:  Denies nausea, heartburn or change in bowel habits Skin: Denies abnormal skin rashes Lymphatics: Denies new lymphadenopathy or easy bruising Neurological:Denies numbness, tingling or new weaknesses Behavioral/Psych: Mood is stable, no new changes  Extremities: No lower extremity edema Breast:  denies any pain or lumps or nodules in either breasts All other systems were reviewed with the patient and are negative.  I have reviewed the past medical history, past surgical history, social history and family history with the patient and they are unchanged  from previous note.  ALLERGIES:  is allergic to lescol [fluvastatin sodium]; lipitor [atorvastatin]; and vytorin [ezetimibe-simvastatin].  MEDICATIONS:  Current Outpatient Prescriptions  Medication Sig Dispense Refill  . alendronate (FOSAMAX) 70 MG tablet Take 1 tablet (70 mg  total) by mouth once a week. Take with a full glass of water on an empty stomach. 12 tablet 3  . anastrozole (ARIMIDEX) 1 MG tablet Take 1 tablet (1 mg total) by mouth daily. 90 tablet 3  . benazepril-hydrochlorthiazide (LOTENSIN HCT) 20-12.5 MG tablet TAKE 1 TABLET BY MOUTH DAILY. 90 tablet 1  . ibuprofen (ADVIL,MOTRIN) 200 MG tablet Take 600 mg by mouth as needed.     No current facility-administered medications for this visit.    Facility-Administered Medications Ordered in Other Visits  Medication Dose Route Frequency Provider Last Rate Last Dose  . sodium chloride flush (NS) 0.9 % injection 10 mL  10 mL Intracatheter PRN Nicholas Lose, MD   10 mL at 12/20/15 1605    PHYSICAL EXAMINATION: ECOG PERFORMANCE STATUS: 1 - Symptomatic but completely ambulatory  Vitals:   09/22/16 1403  BP: 114/62  Pulse: 78  Resp: 18  Temp: 98 F (36.7 C)   Filed Weights   09/22/16 1403  Weight: 165 lb 8 oz (75.1 kg)    GENERAL:alert, no distress and comfortable SKIN: skin color, texture, turgor are normal, no rashes or significant lesions EYES: normal, Conjunctiva are pink and non-injected, sclera clear OROPHARYNX:no exudate, no erythema and lips, buccal mucosa, and tongue normal  NECK: supple, thyroid normal size, non-tender, without nodularity LYMPH:  no palpable lymphadenopathy in the cervical, axillary or inguinal LUNGS: clear to auscultation and percussion with normal breathing effort HEART: regular rate & rhythm and no murmurs and no lower extremity edema ABDOMEN:abdomen soft, non-tender and normal bowel sounds MUSCULOSKELETAL:no cyanosis of digits and no clubbing  NEURO: alert & oriented x 3 with fluent speech, no focal motor/sensory deficits EXTREMITIES: No lower extremity edema  LABORATORY DATA:  I have reviewed the data as listed   Chemistry      Component Value Date/Time   NA 133 (L) 05/09/2016 1000   NA 137 02/21/2016 1037   K 4.4 05/09/2016 1000   K 4.7 02/21/2016 1037    CL 100 (L) 05/09/2016 1000   CO2 26 05/09/2016 1000   CO2 26 02/21/2016 1037   BUN 13 05/09/2016 1000   BUN 15.8 02/21/2016 1037   CREATININE 0.68 05/09/2016 1000   CREATININE 0.8 02/21/2016 1037      Component Value Date/Time   CALCIUM 9.6 05/09/2016 1000   CALCIUM 9.9 02/21/2016 1037   ALKPHOS 54 02/21/2016 1037   AST 18 02/21/2016 1037   ALT 17 02/21/2016 1037   BILITOT 0.38 02/21/2016 1037       Lab Results  Component Value Date   WBC 4.0 05/09/2016   HGB 12.6 05/09/2016   HCT 37.8 05/09/2016   MCV 91.1 05/09/2016   PLT 249 05/09/2016   NEUTROABS 2.9 02/21/2016    ASSESSMENT & PLAN:  Breast cancer of upper-outer quadrant of left female breast ([place]) Left lumpectomy 08/29/2015: Invasive ductal carcinoma 1.2 cm with LVID, with DCIS intermediate grade, 1 sentinel node positive, ER 100%, PR 10%, HER-2 negative ratio 1.79, Ki-67 15% , margins negative, Mammaprint high risk, luminal B, T1cN1 stage II a. Patient does not need axillary lymph node dissection based on current NCCN guidelines and ACOSOG Z 11 clinical trial; luminal type B and high-risk phenotype. Risk of relapse without chemotherapy 24% versus  12% with chemotherapy.  Treatment summary: 1. Dose dense Adriamycin and Cytoxan 4 followed by Abraxane weekly 12  started 10/11/2015 completed 02/21/2016 2. Adjuvant radiation therapy started 03/27/2016 completed 04/23/2016 3. Adjuvant antiestrogen therapy started 05/22/2016 ----------------------------------------------------------------------------------------------------------------- Current treatment: Antiestrogen therapy with anastrozole 1 mg dailyto start 06/01/2016 BRCA2 mutation: Oophorectomy 05/16/2016: No malignancy  Anastrozole Toxicities:  1. Occasional hot flashes  Tinnitus: On examination she is found to have fullness in the right ear. I prescribed her azithromycin antibiotic. If her symptoms do not get better I recommended that she see  ENT.  Surveillance of breast cancer: 1. Mammograms December 2017: Calcifications a six-month follow-up mammogram was ordered 2. patient will need a breast MRI probably in May 2018. (Because of BRCA2 mutation)  Return to clinic in 6 months for toxicity check and follow-up   I spent 25 minutes talking to the patient of which more than half was spent in counseling and coordination of care.  No orders of the defined types were placed in this encounter.  The patient has a good understanding of the overall plan. she agrees with it. she will call with any problems that may develop before the next visit here.   Rulon Eisenmenger, MD 09/22/16

## 2016-10-02 DIAGNOSIS — Z1389 Encounter for screening for other disorder: Secondary | ICD-10-CM | POA: Diagnosis not present

## 2016-10-02 DIAGNOSIS — Z6827 Body mass index (BMI) 27.0-27.9, adult: Secondary | ICD-10-CM | POA: Diagnosis not present

## 2016-10-02 DIAGNOSIS — Z01419 Encounter for gynecological examination (general) (routine) without abnormal findings: Secondary | ICD-10-CM | POA: Diagnosis not present

## 2016-10-02 DIAGNOSIS — Z124 Encounter for screening for malignant neoplasm of cervix: Secondary | ICD-10-CM | POA: Diagnosis not present

## 2016-10-02 DIAGNOSIS — C50912 Malignant neoplasm of unspecified site of left female breast: Secondary | ICD-10-CM | POA: Diagnosis not present

## 2016-10-16 DIAGNOSIS — H93A1 Pulsatile tinnitus, right ear: Secondary | ICD-10-CM

## 2016-10-16 DIAGNOSIS — H6503 Acute serous otitis media, bilateral: Secondary | ICD-10-CM | POA: Diagnosis not present

## 2016-10-16 DIAGNOSIS — H6983 Other specified disorders of Eustachian tube, bilateral: Secondary | ICD-10-CM | POA: Diagnosis not present

## 2016-10-16 HISTORY — DX: Pulsatile tinnitus, right ear: H93.A1

## 2016-11-05 ENCOUNTER — Other Ambulatory Visit: Payer: Self-pay | Admitting: Internal Medicine

## 2016-11-28 DIAGNOSIS — H903 Sensorineural hearing loss, bilateral: Secondary | ICD-10-CM | POA: Diagnosis not present

## 2016-11-28 DIAGNOSIS — H90A22 Sensorineural hearing loss, unilateral, left ear, with restricted hearing on the contralateral side: Secondary | ICD-10-CM | POA: Diagnosis not present

## 2016-11-28 DIAGNOSIS — H93A1 Pulsatile tinnitus, right ear: Secondary | ICD-10-CM | POA: Diagnosis not present

## 2016-12-01 ENCOUNTER — Other Ambulatory Visit: Payer: Self-pay | Admitting: Otolaryngology

## 2016-12-01 DIAGNOSIS — H93A1 Pulsatile tinnitus, right ear: Secondary | ICD-10-CM

## 2016-12-02 ENCOUNTER — Ambulatory Visit
Admission: RE | Admit: 2016-12-02 | Discharge: 2016-12-02 | Disposition: A | Discharge status: Home / Self Care | Payer: Medicare Other | Source: Ambulatory Visit | Attending: Otolaryngology | Admitting: Otolaryngology

## 2016-12-02 DIAGNOSIS — I6523 Occlusion and stenosis of bilateral carotid arteries: Secondary | ICD-10-CM | POA: Diagnosis not present

## 2016-12-02 DIAGNOSIS — H93A1 Pulsatile tinnitus, right ear: Secondary | ICD-10-CM

## 2016-12-04 ENCOUNTER — Encounter: Payer: Self-pay | Admitting: *Deleted

## 2016-12-04 ENCOUNTER — Other Ambulatory Visit: Payer: Self-pay | Admitting: Otolaryngology

## 2016-12-04 DIAGNOSIS — H93A9 Pulsatile tinnitus, unspecified ear: Secondary | ICD-10-CM

## 2016-12-11 ENCOUNTER — Ambulatory Visit (INDEPENDENT_AMBULATORY_CARE_PROVIDER_SITE_OTHER): Payer: Medicare Other | Admitting: Internal Medicine

## 2016-12-11 VITALS — BP 134/72 | HR 72 | Temp 97.3°F | Resp 16 | Ht 65.5 in | Wt 169.8 lb

## 2016-12-11 DIAGNOSIS — R7303 Prediabetes: Secondary | ICD-10-CM | POA: Diagnosis not present

## 2016-12-11 DIAGNOSIS — I10 Essential (primary) hypertension: Secondary | ICD-10-CM

## 2016-12-11 DIAGNOSIS — E782 Mixed hyperlipidemia: Secondary | ICD-10-CM

## 2016-12-11 DIAGNOSIS — Z79899 Other long term (current) drug therapy: Secondary | ICD-10-CM | POA: Diagnosis not present

## 2016-12-11 DIAGNOSIS — E559 Vitamin D deficiency, unspecified: Secondary | ICD-10-CM

## 2016-12-11 NOTE — Patient Instructions (Signed)

## 2016-12-11 NOTE — Progress Notes (Signed)
This very nice 66 y.o. MWF presents for delayed  follow up with Hypertension, Hyperlipidemia, Pre-Diabetes and Vitamin D Deficiency as last seen October 2016.      In Dec 2016 , she underwent L breast Lumpectomy followed by Chemoradiation (Dr Lindi Adie) and then in Sept 2017 underwent BSO for a BRCA2 mutation +ER/+PR/HER2 Neg.      Patient is treated for HTN (1990's) & BP has been controlled at home. Today's BP is at goal - 134/72. Patient has had no complaints of any cardiac type chest pain, palpitations, dyspnea/orthopnea/PND, dizziness, claudication, or dependent edema.     Patient is intolerant to Statins w/elevated LFT's and also intolerant to [place] and her Hyperlipidemia is not controlled with diet at last check in Oct 2016.  Last Lipids were not at goal: Lab Results  Component Value Date   CHOL 243 (H) 06/07/2015   HDL 58 06/07/2015   LDLCALC 164 (H) 06/07/2015   TRIG 104 06/07/2015   CHOLHDL 4.2 06/07/2015      Also, the patient has history of PreDiabetes  With A1c 6.2% in May 2016. and has had no symptoms of reactive hypoglycemia, diabetic polys, paresthesias or visual blurring.  Last A1c was  Lab Results  Component Value Date   HGBA1C 5.7 (H) 06/07/2015      Further, the patient also has history of Vitamin D Deficiency and supplements vitamin D without any suspected side-effects. Last vitamin D was extremely low (goal 70-100): Lab Results  Component Value Date   VD25OH 29 (L) 06/07/2015   Current Outpatient Prescriptions on File Prior to Visit  Medication Sig  . alendronate  70 MG tablet Take 1 tab once a week.   Marland Kitchen anastrozole (ARIMIDEX) 1 MG tablet Take 1 tab daily.  . benazepril-hctz 20-12.5 MG tablet TAKE 1 TAB DAILY.  Marland Kitchen ibuprofen  200 MG tablet Take 600 mg as needed.   Allergies  Allergen Reactions  . Lescol [Fluvastatin Sodium]     Myalgia  . Lipitor [Atorvastatin]     Increased LFT's  . Vytorin [Ezetimibe-Simvastatin]     myalgia   PMHx:   Past  Medical History:  Diagnosis Date  . Allergic rhinitis, cause unspecified   . Anemia    history of anemia while teenager  . BRCA2 positive   . Breast cancer of upper-outer quadrant of left female breast ([place])    chemo complete 01/2016, radiation 04/2016  . DDD (degenerative disc disease)   . Hyperlipidemia   . Hypertension   . Osteopenia 12/2011   Spine T -0.4, Femur -2.1  . PONV (postoperative nausea and vomiting)   . Splenic artery aneurysm ([place]) 2013   COILS DONE  . Vitamin D deficiency    Immunization History  Administered Date(s) Administered  . DT 01/22/2015  . Influenza Split 06/08/2014, 06/02/2016  . Influenza-Unspecified 06/14/2013, 06/04/2015  . Meningococcal Conjugate 09/02/2011  . PPD Test 01/18/2014  . Pneumococcal-Unspecified 09/22/1995, 09/02/2011  . Td 11/27/1994, 04/29/2004   Past Surgical History:  Procedure Laterality Date  . BREAST LUMPECTOMY WITH RADIOACTIVE SEED AND SENTINEL LYMPH NODE BIOPSY Left 08/29/2015   Procedure: LEFT BREAST LUMPECTOMY WITH RADIOACTIVE SEED WITH AXILLARY SENTINEL LYMPH NODE BIOPSY;  Surgeon: Excell Seltzer, MD;  Location: [facility];  Service: General;  Laterality: Left;  . [place]  . COLONOSCOPY  Jan. 7, 2014  . LAPAROSCOPIC BILATERAL SALPINGO OOPHERECTOMY Bilateral 05/16/2016   Procedure: LAPAROSCOPIC BILATERAL SALPINGO OOPHORECTOMY;  Surgeon: Juliann Pulse  Marvel Plan, MD;  Location: [facility];  Service: Gynecology;  Laterality: Bilateral;  . [place] SURGERY  2001, 2010  . PORTACATH PLACEMENT Bilateral 10/09/2015   Procedure: INSERTION PORT-A-CATH;  Surgeon: Excell Seltzer, MD;  Location: [facility];  Service: General;  Laterality: Bilateral;  . Portacath removal    . RE-EXCISION OF BREAST LUMPECTOMY Left 09/07/2015   Procedure: RE-EXCISION OF LEFT BREAST LUMPECTOMY;  Surgeon: Excell Seltzer, MD;  Location: [facility];  Service: General;  Laterality: Left;  . SPINE SURGERY  1998    cervical diskectomy  . SPINE SURGERY  2001, 2010   Lumbar diskectomy  . splenic aneurysm  02/10/2012   Aneurysm of splenic artery  . TONSILLECTOMY    . VISCERAL ANGIOGRAM N/A 02/10/2012   Procedure: VISCERAL ANGIOGRAM;  Surgeon: Serafina Mitchell, MD;  Location: [facility] C[facility];  Service: Cardiovascular;  Laterality: N/A;   FHx:    Reviewed / unchanged  SHx:    Reviewed / unchanged  Systems Review:  Constitutional: Denies fever, chills, wt changes, headaches, insomnia, fatigue, night sweats, change in appetite. Eyes: Denies redness, blurred vision, diplopia, discharge, itchy, watery eyes.  ENT: Denies discharge, congestion, post nasal drip, epistaxis, sore throat, earache, hearing loss, dental pain, tinnitus, vertigo, sinus pain, snoring.  CV: Denies chest pain, palpitations, irregular heartbeat, syncope, dyspnea, diaphoresis, orthopnea, PND, claudication or edema. Respiratory: denies cough, dyspnea, DOE, pleurisy, hoarseness, laryngitis, wheezing.  Gastrointestinal: Denies dysphagia, odynophagia, heartburn, reflux, water brash, abdominal pain or cramps, nausea, vomiting, bloating, diarrhea, constipation, hematemesis, melena, hematochezia  or hemorrhoids. Genitourinary: Denies dysuria, frequency, urgency, nocturia, hesitancy, discharge, hematuria or flank pain. Musculoskeletal: Denies arthralgias, myalgias, stiffness, jt. swelling, pain, limping or strain/sprain.  Skin: Denies pruritus, rash, hives, warts, acne, eczema or change in skin lesion(s). Neuro: No weakness, tremor, incoordination, spasms, paresthesia or pain. Psychiatric: Denies confusion, memory loss or sensory loss. Endo: Denies change in weight, skin or hair change.  Heme/Lymph: No excessive bleeding, bruising or enlarged lymph nodes.  Physical Exam  BP 134/72   P 72   T 97.3 F    R 16   Ht 5' 5.5"    Wt 169 lb 12.8 oz    BMI 27.83   Appears well nourished, well groomed  and in no distress.  Eyes: PERRLA, EOMs,  conjunctiva no swelling or erythema. Sinuses: No frontal/maxillary tenderness ENT/Mouth: EAC's clear, TM's nl w/o erythema, bulging. Nares clear w/o erythema, swelling, exudates. Oropharynx clear without erythema or exudates. Oral hygiene is good. Tongue normal, non obstructing. Hearing intact.  Neck: Supple. Thyroid nl. Car 2+/2+ without bruits, nodes or JVD. Chest: Respirations nl with BS clear & equal w/o rales, rhonchi, wheezing or stridor.  Cor: Heart sounds normal w/ regular rate and rhythm without sig. murmurs, gallops, clicks or rubs. Peripheral pulses normal and equal  without edema.  Abdomen: Soft & bowel sounds normal. Non-tender w/o guarding, rebound, hernias, masses or organomegaly.  Lymphatics: Unremarkable.  Musculoskeletal: Full ROM all peripheral extremities, joint stability, 5/5 strength and normal gait.  Skin: Warm, dry without exposed rashes, lesions or ecchymosis apparent.  Neuro: Cranial nerves intact, reflexes equal bilaterally. Sensory-motor testing grossly intact. Tendon reflexes grossly intact.  Pysch: Alert & oriented x 3.  Insight and judgement nl & appropriate. No ideations.  Assessment and Plan:  1. Essential hypertension  - Continue medication, monitor blood pressure at home.  - Continue DASH diet. Reminder to go to the ER if any CP,  SOB, nausea, dizziness, severe HA, changes vision/speech,  left arm numbness and tingling and jaw pain.  - CBC with Differential/Platelet - BASIC METABOLIC PANEL WITH GFR - Magnesium - TSH  2. Mixed hyperlipidemia  - Continue diet/meds, exercise,& lifestyle modifications.  - Continue monitor periodic cholesterol/liver & renal functions   - Hepatic function panel - Lipid panel - TSH  3. Prediabetes  - Continue diet, exercise, lifestyle modifications.  - Monitor appropriate labs.  - Hemoglobin A1c - Insulin, random  4. Vitamin D deficiency  - Continue supplementation.  - VITAMIN D 25 Hydroxy  5. Medication  management  - CBC with Differential/Platelet - BASIC METABOLIC PANEL WITH GFR - Hepatic function panel - Magnesium - Lipid panel - TSH - Hemoglobin A1c - Insulin, random - VITAMIN D 25 Hydroxy      Discussed  regular exercise, BP monitoring, weight control to achieve/maintain BMI less than 25 and discussed med and SE's. Recommended labs to assess and monitor clinical status with further disposition pending results of labs. Over 30 minutes of exam, counseling, chart review was performed.

## 2016-12-12 ENCOUNTER — Other Ambulatory Visit: Payer: Self-pay | Admitting: Internal Medicine

## 2016-12-12 ENCOUNTER — Encounter: Payer: Self-pay | Admitting: Internal Medicine

## 2016-12-12 DIAGNOSIS — E782 Mixed hyperlipidemia: Secondary | ICD-10-CM

## 2016-12-12 LAB — CBC WITH DIFFERENTIAL/PLATELET
BASOS ABS: 62 {cells}/uL (ref 0–200)
Basophils Relative: 1 %
EOS ABS: 310 {cells}/uL (ref 15–500)
Eosinophils Relative: 5 %
HCT: 40.2 % (ref 35.0–45.0)
Hemoglobin: 13.2 g/dL (ref 11.7–15.5)
LYMPHS PCT: 19 %
Lymphs Abs: 1178 cells/uL (ref 850–3900)
MCH: 30.6 pg (ref 27.0–33.0)
MCHC: 32.8 g/dL (ref 32.0–36.0)
MCV: 93.3 fL (ref 80.0–100.0)
MONOS PCT: 6 %
MPV: 9.8 fL (ref 7.5–12.5)
Monocytes Absolute: 372 cells/uL (ref 200–950)
NEUTROS PCT: 69 %
Neutro Abs: 4278 cells/uL (ref 1500–7800)
PLATELETS: 261 10*3/uL (ref 140–400)
RBC: 4.31 MIL/uL (ref 3.80–5.10)
RDW: 13.8 % (ref 11.0–15.0)
WBC: 6.2 10*3/uL (ref 3.8–10.8)

## 2016-12-12 LAB — BASIC METABOLIC PANEL WITH GFR
BUN: 14 mg/dL (ref 7–25)
CALCIUM: 9.6 mg/dL (ref 8.6–10.4)
CO2: 23 mmol/L (ref 20–31)
Chloride: 95 mmol/L — ABNORMAL LOW (ref 98–110)
Creat: 0.72 mg/dL (ref 0.50–0.99)
GFR, Est African American: >89 mL/min (ref >=60)
GFR, Est Non African American: 87 mL/min (ref >=60)
GLUCOSE: 86 mg/dL (ref 65–99)
Potassium: 3.6 mmol/L (ref 3.5–5.3)
Sodium: 131 mmol/L — ABNORMAL LOW (ref 135–146)

## 2016-12-12 LAB — LIPID PANEL
Cholesterol: 271 mg/dL — ABNORMAL HIGH (ref <200)
HDL: 59 mg/dL (ref >50)
LDL Cholesterol: 165 mg/dL — ABNORMAL HIGH (ref <100)
TRIGLYCERIDES: 236 mg/dL — AB (ref <150)
Total CHOL/HDL Ratio: 4.6 Ratio (ref <5.0)
VLDL: 47 mg/dL — AB (ref <30)

## 2016-12-12 LAB — INSULIN, RANDOM: Insulin: 26.5 u[IU]/mL — ABNORMAL HIGH (ref 2.0–19.6)

## 2016-12-12 LAB — MAGNESIUM: Magnesium: 1.8 mg/dL (ref 1.5–2.5)

## 2016-12-12 LAB — HEMOGLOBIN A1C
HEMOGLOBIN A1C: 5.4 % (ref <5.7)
Mean Plasma Glucose: 108 mg/dL

## 2016-12-12 LAB — VITAMIN D 25 HYDROXY (VIT D DEFICIENCY, FRACTURES): VIT D 25 HYDROXY: 27 ng/mL — AB (ref 30–100)

## 2016-12-12 LAB — HEPATIC FUNCTION PANEL
ALBUMIN: 4.1 g/dL (ref 3.6–5.1)
ALT: 19 U/L (ref 6–29)
AST: 23 U/L (ref 10–35)
Alkaline Phosphatase: 59 U/L (ref 33–130)
BILIRUBIN INDIRECT: 0.2 mg/dL (ref 0.2–1.2)
BILIRUBIN TOTAL: 0.3 mg/dL (ref 0.2–1.2)
Bilirubin, Direct: 0.1 mg/dL (ref <=0.2)
TOTAL PROTEIN: 6.9 g/dL (ref 6.1–8.1)

## 2016-12-12 LAB — TSH: TSH: 1.03 m[IU]/L

## 2016-12-12 MED ORDER — EZETIMIBE 10 MG PO TABS: ORAL_TABLET | ORAL | 1 refills | Status: DC

## 2016-12-18 ENCOUNTER — Ambulatory Visit
Admission: RE | Admit: 2016-12-18 | Discharge: 2016-12-18 | Disposition: A | Discharge status: Home / Self Care | Payer: Medicare Other | Source: Ambulatory Visit | Attending: Otolaryngology | Admitting: Otolaryngology

## 2016-12-18 DIAGNOSIS — H9311 Tinnitus, right ear: Secondary | ICD-10-CM | POA: Diagnosis not present

## 2016-12-18 DIAGNOSIS — H93A9 Pulsatile tinnitus, unspecified ear: Secondary | ICD-10-CM

## 2016-12-18 MED ORDER — GADOBENATE DIMEGLUMINE 529 MG/ML IV SOLN
15.0000 mL | Freq: Once | INTRAVENOUS | Status: AC | PRN
  Administered 2016-12-18: 15 mL via INTRAVENOUS

## 2016-12-31 DIAGNOSIS — R030 Elevated blood-pressure reading, without diagnosis of hypertension: Secondary | ICD-10-CM | POA: Diagnosis not present

## 2016-12-31 DIAGNOSIS — Z6828 Body mass index (BMI) 28.0-28.9, adult: Secondary | ICD-10-CM | POA: Diagnosis not present

## 2016-12-31 DIAGNOSIS — I671 Cerebral aneurysm, nonruptured: Secondary | ICD-10-CM | POA: Diagnosis not present

## 2017-01-05 ENCOUNTER — Other Ambulatory Visit: Payer: Self-pay | Admitting: Neurosurgery

## 2017-01-05 DIAGNOSIS — I671 Cerebral aneurysm, nonruptured: Secondary | ICD-10-CM

## 2017-01-07 ENCOUNTER — Ambulatory Visit
Admission: RE | Admit: 2017-01-07 | Discharge: 2017-01-07 | Disposition: A | Discharge status: Home / Self Care | Payer: Medicare Other | Source: Ambulatory Visit | Attending: Hematology and Oncology | Admitting: Hematology and Oncology

## 2017-01-07 DIAGNOSIS — Z170 Estrogen receptor positive status [ER+]: Secondary | ICD-10-CM

## 2017-01-07 DIAGNOSIS — Z1501 Genetic susceptibility to malignant neoplasm of breast: Secondary | ICD-10-CM

## 2017-01-07 DIAGNOSIS — Z1509 Genetic susceptibility to other malignant neoplasm: Secondary | ICD-10-CM

## 2017-01-07 DIAGNOSIS — C50412 Malignant neoplasm of upper-outer quadrant of left female breast: Secondary | ICD-10-CM

## 2017-01-07 DIAGNOSIS — Z803 Family history of malignant neoplasm of breast: Secondary | ICD-10-CM | POA: Diagnosis not present

## 2017-01-07 DIAGNOSIS — Z853 Personal history of malignant neoplasm of breast: Secondary | ICD-10-CM | POA: Diagnosis not present

## 2017-01-07 MED ORDER — GADOBENATE DIMEGLUMINE 529 MG/ML IV SOLN
15.0000 mL | Freq: Once | INTRAVENOUS | Status: AC | PRN
  Administered 2017-01-07: 15 mL via INTRAVENOUS

## 2017-02-02 ENCOUNTER — Other Ambulatory Visit: Payer: Self-pay | Admitting: Physician Assistant

## 2017-02-03 ENCOUNTER — Other Ambulatory Visit: Payer: Self-pay | Admitting: Neurosurgery

## 2017-02-03 ENCOUNTER — Ambulatory Visit (HOSPITAL_COMMUNITY)
Admission: RE | Admit: 2017-02-03 | Discharge: 2017-02-03 | Disposition: A | Discharge status: Home / Self Care | Payer: Medicare Other | Source: Ambulatory Visit | Attending: Neurosurgery | Admitting: Neurosurgery

## 2017-02-03 ENCOUNTER — Encounter (HOSPITAL_COMMUNITY): Payer: Self-pay | Admitting: Neurosurgery

## 2017-02-03 DIAGNOSIS — I671 Cerebral aneurysm, nonruptured: Secondary | ICD-10-CM

## 2017-02-03 DIAGNOSIS — Q282 Arteriovenous malformation of cerebral vessels: Secondary | ICD-10-CM | POA: Insufficient documentation

## 2017-02-03 DIAGNOSIS — E785 Hyperlipidemia, unspecified: Secondary | ICD-10-CM | POA: Diagnosis not present

## 2017-02-03 DIAGNOSIS — I10 Essential (primary) hypertension: Secondary | ICD-10-CM | POA: Diagnosis not present

## 2017-02-03 DIAGNOSIS — E559 Vitamin D deficiency, unspecified: Secondary | ICD-10-CM | POA: Insufficient documentation

## 2017-02-03 DIAGNOSIS — H93A1 Pulsatile tinnitus, right ear: Secondary | ICD-10-CM | POA: Diagnosis not present

## 2017-02-03 DIAGNOSIS — M8580 Other specified disorders of bone density and structure, unspecified site: Secondary | ICD-10-CM | POA: Insufficient documentation

## 2017-02-03 HISTORY — PX: IR NEURO EACH ADD'L AFTER BASIC UNI RIGHT (MS): IMG5374

## 2017-02-03 HISTORY — PX: IR ANGIO INTRA EXTRACRAN SEL INTERNAL CAROTID BILAT MOD SED: IMG5363

## 2017-02-03 HISTORY — PX: IR ANGIO EXTERNAL CAROTID SEL EXT CAROTID BILAT MOD SED: IMG5372

## 2017-02-03 HISTORY — PX: IR ANGIO VERTEBRAL SEL VERTEBRAL BILAT MOD SED: IMG5369

## 2017-02-03 LAB — URINALYSIS, ROUTINE W REFLEX MICROSCOPIC
BILIRUBIN URINE: NEGATIVE
Glucose, UA: NEGATIVE mg/dL
Hgb urine dipstick: NEGATIVE
KETONES UR: NEGATIVE mg/dL
NITRITE: NEGATIVE
PROTEIN: NEGATIVE mg/dL
Specific Gravity, Urine: 1.01 (ref 1.005–1.030)
Squamous Epithelial / LPF: NONE SEEN
pH: 7 (ref 5.0–8.0)

## 2017-02-03 LAB — BASIC METABOLIC PANEL
Anion gap: 9 (ref 5–15)
BUN: 14 mg/dL (ref 6–20)
CO2: 26 mmol/L (ref 22–32)
Calcium: 9.7 mg/dL (ref 8.9–10.3)
Chloride: 100 mmol/L — ABNORMAL LOW (ref 101–111)
Creatinine, Ser: 0.71 mg/dL (ref 0.44–1.00)
GFR calc Af Amer: >60 mL/min (ref >60)
GFR calc non Af Amer: >60 mL/min (ref >60)
GLUCOSE: 99 mg/dL (ref 65–99)
POTASSIUM: 3.5 mmol/L (ref 3.5–5.1)
SODIUM: 135 mmol/L (ref 135–145)

## 2017-02-03 LAB — APTT: aPTT: 37 seconds — ABNORMAL HIGH (ref 24–36)

## 2017-02-03 LAB — CBC WITH DIFFERENTIAL/PLATELET
Basophils Absolute: 0.1 10*3/uL (ref 0.0–0.1)
Basophils Relative: 1 %
EOS PCT: 5 %
Eosinophils Absolute: 0.3 10*3/uL (ref 0.0–0.7)
HCT: 38.9 % (ref 36.0–46.0)
HEMOGLOBIN: 12.8 g/dL (ref 12.0–15.0)
Lymphocytes Relative: 20 %
Lymphs Abs: 0.9 10*3/uL (ref 0.7–4.0)
MCH: 30.2 pg (ref 26.0–34.0)
MCHC: 32.9 g/dL (ref 30.0–36.0)
MCV: 91.7 fL (ref 78.0–100.0)
Monocytes Absolute: 0.4 10*3/uL (ref 0.1–1.0)
Monocytes Relative: 8 %
Neutro Abs: 3.1 10*3/uL (ref 1.7–7.7)
Neutrophils Relative %: 66 %
PLATELETS: 293 10*3/uL (ref 150–400)
RBC: 4.24 MIL/uL (ref 3.87–5.11)
RDW: 13.5 % (ref 11.5–15.5)
WBC: 4.7 10*3/uL (ref 4.0–10.5)

## 2017-02-03 LAB — PROTIME-INR
INR: 1.03
Prothrombin Time: 13.6 seconds (ref 11.4–15.2)

## 2017-02-03 MED ORDER — FENTANYL CITRATE (PF) 100 MCG/2ML IJ SOLN
INTRAMUSCULAR | Status: AC
  Filled 2017-02-03: qty 2

## 2017-02-03 MED ORDER — MIDAZOLAM HCL 2 MG/2ML IJ SOLN
INTRAMUSCULAR | Status: AC | PRN
  Administered 2017-02-03: 0.5 mg via INTRAVENOUS
  Administered 2017-02-03: 1 mg via INTRAVENOUS

## 2017-02-03 MED ORDER — MIDAZOLAM HCL 2 MG/2ML IJ SOLN
INTRAMUSCULAR | Status: AC
  Filled 2017-02-03: qty 2

## 2017-02-03 MED ORDER — ACETAMINOPHEN 325 MG PO TABS
ORAL_TABLET | ORAL | Status: AC
  Administered 2017-02-03: 650 mg via ORAL
  Filled 2017-02-03: qty 2

## 2017-02-03 MED ORDER — ACETAMINOPHEN 325 MG PO TABS
650.0000 mg | ORAL_TABLET | Freq: Once | ORAL | Status: AC
  Administered 2017-02-03: 650 mg via ORAL
  Filled 2017-02-03: qty 2

## 2017-02-03 MED ORDER — FENTANYL CITRATE (PF) 100 MCG/2ML IJ SOLN
INTRAMUSCULAR | Status: AC | PRN
  Administered 2017-02-03: 25 ug via INTRAVENOUS

## 2017-02-03 MED ORDER — IOPAMIDOL (ISOVUE-300) INJECTION 61%
INTRAVENOUS | Status: AC
  Filled 2017-02-03: qty 50

## 2017-02-03 MED ORDER — SODIUM CHLORIDE 0.9 % IV SOLN
INTRAVENOUS | Status: DC
Start: 2017-02-03 — End: 2017-02-04

## 2017-02-03 MED ORDER — LIDOCAINE HCL (PF) 1 % IJ SOLN
INTRAMUSCULAR | Status: AC | PRN
  Administered 2017-02-03: 10 mL

## 2017-02-03 MED ORDER — ONDANSETRON HCL 4 MG/2ML IJ SOLN
INTRAMUSCULAR | Status: AC
  Filled 2017-02-03: qty 2

## 2017-02-03 MED ORDER — CHLORHEXIDINE GLUCONATE CLOTH 2 % EX PADS: 6.0000 | MEDICATED_PAD | Freq: Once | CUTANEOUS | Status: DC

## 2017-02-03 MED ORDER — HEPARIN SODIUM (PORCINE) 1000 UNIT/ML IJ SOLN
INTRAMUSCULAR | Status: AC
  Filled 2017-02-03: qty 2

## 2017-02-03 MED ORDER — LIDOCAINE HCL 1 % IJ SOLN
INTRAMUSCULAR | Status: AC
  Filled 2017-02-03: qty 20

## 2017-02-03 MED ORDER — ONDANSETRON HCL 4 MG/2ML IJ SOLN
INTRAMUSCULAR | Status: AC | PRN
  Administered 2017-02-03: 4 mg via INTRAVENOUS

## 2017-02-03 MED ORDER — IOPAMIDOL (ISOVUE-300) INJECTION 61%
INTRAVENOUS | Status: AC
  Administered 2017-02-03: 80 mL
  Filled 2017-02-03: qty 100

## 2017-02-03 MED ORDER — HYDROCODONE-ACETAMINOPHEN 5-325 MG PO TABS: 1.0000 | ORAL_TABLET | ORAL | Status: DC | PRN

## 2017-02-03 NOTE — Sedation Documentation (Signed)
Patient is resting comfortably. 

## 2017-02-03 NOTE — H&P (Signed)
CC:  Tinnitus  HPI: Jacqueline Jenkins is a 66 y.o. female initially presenting to ENT - Dr. Wilburn Cornelia with right-sided pulsatile tinnitus. She describes pulsating in her right ear which does keep her up at night. She has had some right-sided parietal HA. 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 She therefore presents for further workup with diagnostic cerebral angiogram.  PMH: Past Medical History:  Diagnosis Date  . Allergic rhinitis, cause unspecified   . Anemia    history of anemia while teenager  . BRCA2 positive   . Breast cancer of upper-outer quadrant of left female breast ([place])    chemo complete 01/2016, radiation 04/2016  . DDD (degenerative disc disease)   . Hyperlipidemia   . Hypertension   . Osteopenia 12/2011   Spine T -0.4, Femur -2.1  . PONV (postoperative nausea and vomiting)   . Splenic artery aneurysm ([place]) 2013   COILS DONE  . Vitamin D deficiency     PSH: Past Surgical History:  Procedure Laterality Date  . BREAST LUMPECTOMY WITH RADIOACTIVE SEED AND SENTINEL LYMPH NODE BIOPSY Left 08/29/2015   Procedure: LEFT BREAST LUMPECTOMY WITH RADIOACTIVE SEED WITH AXILLARY SENTINEL LYMPH NODE BIOPSY;  Surgeon: Excell Seltzer, MD;  Location: [facility];  Service: General;  Laterality: Left;  . [place]  . COLONOSCOPY  Jan. 7, 2014  . LAPAROSCOPIC BILATERAL SALPINGO OOPHERECTOMY Bilateral 05/16/2016   Procedure: LAPAROSCOPIC BILATERAL SALPINGO OOPHORECTOMY;  Surgeon: Paula Compton, MD;  Location: [facility];  Service: Gynecology;  Laterality: Bilateral;  . [place] SURGERY  2001, 2010  . PORTACATH PLACEMENT Bilateral 10/09/2015   Procedure: INSERTION PORT-A-CATH;  Surgeon: Excell Seltzer, MD;  Location: [facility];  Service: General;  Laterality: Bilateral;  . Portacath removal    . RE-EXCISION OF BREAST LUMPECTOMY Left 09/07/2015   Procedure: RE-EXCISION OF LEFT BREAST LUMPECTOMY;  Surgeon: Excell Seltzer, MD;   Location: [facility];  Service: General;  Laterality: Left;  . SPINE SURGERY  1998   cervical diskectomy  . SPINE SURGERY  2001, 2010   Lumbar diskectomy  . splenic aneurysm  02/10/2012   Aneurysm of splenic artery  . TONSILLECTOMY    . VISCERAL ANGIOGRAM N/A 02/10/2012   Procedure: VISCERAL ANGIOGRAM;  Surgeon: Serafina Mitchell, MD;  [facility] C[facility];  Service: Cardiovascular;  Laterality: N/A;    SH: Social History  Substance Use Topics  . Smoking status: Never Smoker  . Smokeless tobacco: Never Used  . Alcohol use Yes     Comment: 3-4 times per year    MEDS: Prior to Admission medications   Medication Sig Start Date End Date Taking? Authorizing Provider  alendronate (FOSAMAX) 70 MG tablet Take 1 tablet (70 mg total) by mouth once a week. Take with a full glass of water on an empty stomach. Patient taking differently: Take 70 mg by mouth every Sunday. Take with a full glass of water on an empty stomach. 05/22/16  Yes Nicholas Lose, MD  anastrozole (ARIMIDEX) 1 MG tablet Take 1 tablet (1 mg total) by mouth daily. 05/22/16  Yes Nicholas Lose, MD  benazepril-hydrochlorthiazide (LOTENSIN HCT) 20-12.5 MG tablet TAKE 1 TABLET BY MOUTH DAILY. 02/02/17  Yes Vicie Mutters, PA-C  ezetimibe (ZETIA) 10 MG tablet Take 1 tablet daily for Cholesterol Patient taking differently: Take 10 mg by mouth daily.  12/12/16 07/14/17 Yes Unk Pinto, MD  ibuprofen (ADVIL,MOTRIN) 200 MG tablet Take 200 mg by mouth every 6 (six) hours as needed  for headache or moderate pain.    Yes [provider]    ALLERGY: Allergies  Allergen Reactions  . Lescol [Fluvastatin Sodium] Other (See Comments)    Myalgia  . Lipitor [Atorvastatin] Other (See Comments)    Increased LFT's  . Vytorin [Ezetimibe-Simvastatin] Other (See Comments)    myalgia    ROS: ROS  NEUROLOGIC EXAM: Awake, alert, oriented Memory and concentration grossly intact Speech fluent, appropriate CN grossly  intact Motor exam: Upper Extremities Deltoid Bicep Tricep Grip  Right 5/5 5/5 5/5 5/5  Left 5/5 5/5 5/5 5/5   Lower Extremity IP Quad PF DF EHL  Right 5/5 5/5 5/5 5/5 5/5  Left 5/5 5/5 5/5 5/5 5/5   Sensation grossly intact to LT  IMPRESSION: - 66 y.o. female with likely right sided transverse sigmoid dural AV Fistula.  PLAN: - Proceed with diagnostic cerebral angiogram  I have reviewed the risks, benefits, and alternatives to the angiogram with the patient in the office. All questions were answered and consent was obtained.

## 2017-02-03 NOTE — Sedation Documentation (Signed)
Patient denies pain and is resting comfortably.  

## 2017-02-03 NOTE — Discharge Instructions (Signed)

## 2017-02-03 NOTE — Sedation Documentation (Signed)
Pt. States that she is more awake than she would like to be. No c/o pain.

## 2017-02-11 LAB — HM MAMMOGRAPHY: HM Mammogram: NORMAL (ref 0–4)

## 2017-02-12 ENCOUNTER — Other Ambulatory Visit: Payer: Self-pay | Admitting: Hematology and Oncology

## 2017-02-12 DIAGNOSIS — N63 Unspecified lump in unspecified breast: Secondary | ICD-10-CM

## 2017-02-12 DIAGNOSIS — R921 Mammographic calcification found on diagnostic imaging of breast: Secondary | ICD-10-CM

## 2017-02-18 DIAGNOSIS — Z6827 Body mass index (BMI) 27.0-27.9, adult: Secondary | ICD-10-CM | POA: Diagnosis not present

## 2017-02-18 DIAGNOSIS — I671 Cerebral aneurysm, nonruptured: Secondary | ICD-10-CM | POA: Diagnosis not present

## 2017-02-18 DIAGNOSIS — R030 Elevated blood-pressure reading, without diagnosis of hypertension: Secondary | ICD-10-CM | POA: Diagnosis not present

## 2017-02-19 ENCOUNTER — Other Ambulatory Visit: Payer: Self-pay | Admitting: Hematology and Oncology

## 2017-02-19 ENCOUNTER — Ambulatory Visit
Admission: RE | Admit: 2017-02-19 | Discharge: 2017-02-19 | Disposition: A | Discharge status: Home / Self Care | Payer: Medicare Other | Source: Ambulatory Visit | Attending: Hematology and Oncology | Admitting: Hematology and Oncology

## 2017-02-19 DIAGNOSIS — R921 Mammographic calcification found on diagnostic imaging of breast: Secondary | ICD-10-CM

## 2017-02-19 DIAGNOSIS — R928 Other abnormal and inconclusive findings on diagnostic imaging of breast: Secondary | ICD-10-CM | POA: Diagnosis not present

## 2017-02-23 ENCOUNTER — Other Ambulatory Visit (HOSPITAL_COMMUNITY): Payer: Self-pay | Admitting: Neurosurgery

## 2017-02-23 ENCOUNTER — Other Ambulatory Visit: Payer: Self-pay | Admitting: Neurosurgery

## 2017-02-23 DIAGNOSIS — I770 Arteriovenous fistula, acquired: Secondary | ICD-10-CM

## 2017-02-25 ENCOUNTER — Encounter (HOSPITAL_COMMUNITY): Payer: Self-pay

## 2017-02-25 ENCOUNTER — Other Ambulatory Visit (HOSPITAL_COMMUNITY): Payer: Self-pay | Admitting: *Deleted

## 2017-02-25 ENCOUNTER — Encounter (HOSPITAL_COMMUNITY)
Admission: RE | Admit: 2017-02-25 | Discharge: 2017-02-25 | Disposition: A | Discharge status: Home / Self Care | Payer: Medicare Other | Source: Ambulatory Visit | Attending: Neurosurgery | Admitting: Neurosurgery

## 2017-02-25 DIAGNOSIS — C50412 Malignant neoplasm of upper-outer quadrant of left female breast: Secondary | ICD-10-CM | POA: Insufficient documentation

## 2017-02-25 DIAGNOSIS — Z170 Estrogen receptor positive status [ER+]: Secondary | ICD-10-CM | POA: Insufficient documentation

## 2017-02-25 DIAGNOSIS — Z79899 Other long term (current) drug therapy: Secondary | ICD-10-CM | POA: Insufficient documentation

## 2017-02-25 DIAGNOSIS — D649 Anemia, unspecified: Secondary | ICD-10-CM | POA: Diagnosis not present

## 2017-02-25 DIAGNOSIS — Z01818 Encounter for other preprocedural examination: Secondary | ICD-10-CM | POA: Insufficient documentation

## 2017-02-25 DIAGNOSIS — I671 Cerebral aneurysm, nonruptured: Secondary | ICD-10-CM | POA: Insufficient documentation

## 2017-02-25 DIAGNOSIS — I10 Essential (primary) hypertension: Secondary | ICD-10-CM | POA: Diagnosis not present

## 2017-02-25 DIAGNOSIS — E785 Hyperlipidemia, unspecified: Secondary | ICD-10-CM | POA: Insufficient documentation

## 2017-02-25 HISTORY — DX: Polyneuropathy, unspecified: G62.9

## 2017-02-25 LAB — CBC WITH DIFFERENTIAL/PLATELET
BASOS ABS: 0.1 10*3/uL (ref 0.0–0.1)
BASOS PCT: 1 %
EOS PCT: 3 %
Eosinophils Absolute: 0.2 10*3/uL (ref 0.0–0.7)
HCT: 39.4 % (ref 36.0–46.0)
Hemoglobin: 13.2 g/dL (ref 12.0–15.0)
Lymphocytes Relative: 18 %
Lymphs Abs: 1.3 10*3/uL (ref 0.7–4.0)
MCH: 30.5 pg (ref 26.0–34.0)
MCHC: 33.5 g/dL (ref 30.0–36.0)
MCV: 91 fL (ref 78.0–100.0)
MONO ABS: 0.3 10*3/uL (ref 0.1–1.0)
Monocytes Relative: 5 %
Neutro Abs: 5 10*3/uL (ref 1.7–7.7)
Neutrophils Relative %: 73 %
PLATELETS: 243 10*3/uL (ref 150–400)
RBC: 4.33 MIL/uL (ref 3.87–5.11)
RDW: 13.4 % (ref 11.5–15.5)
WBC: 6.9 10*3/uL (ref 4.0–10.5)

## 2017-02-25 LAB — URINALYSIS, ROUTINE W REFLEX MICROSCOPIC
Bilirubin Urine: NEGATIVE
Glucose, UA: NEGATIVE mg/dL
HGB URINE DIPSTICK: NEGATIVE
Ketones, ur: NEGATIVE mg/dL
Leukocytes, UA: NEGATIVE
Nitrite: NEGATIVE
Protein, ur: NEGATIVE mg/dL
SPECIFIC GRAVITY, URINE: 1.008 (ref 1.005–1.030)
SQUAMOUS EPITHELIAL / LPF: NONE SEEN
WBC, UA: NONE SEEN WBC/hpf (ref 0–5)
pH: 7 (ref 5.0–8.0)

## 2017-02-25 LAB — PROTIME-INR
INR: 0.99
Prothrombin Time: 13.1 seconds (ref 11.4–15.2)

## 2017-02-25 LAB — BASIC METABOLIC PANEL
Anion gap: 8 (ref 5–15)
BUN: 13 mg/dL (ref 6–20)
CALCIUM: 9.8 mg/dL (ref 8.9–10.3)
CO2: 25 mmol/L (ref 22–32)
Chloride: 98 mmol/L — ABNORMAL LOW (ref 101–111)
Creatinine, Ser: 0.77 mg/dL (ref 0.44–1.00)
GFR calc Af Amer: >60 mL/min (ref >60)
Glucose, Bld: 97 mg/dL (ref 65–99)
Potassium: 3.6 mmol/L (ref 3.5–5.1)
Sodium: 131 mmol/L — ABNORMAL LOW (ref 135–145)

## 2017-02-25 LAB — APTT: aPTT: 34 seconds (ref 24–36)

## 2017-02-25 NOTE — Progress Notes (Signed)
Pt denies cardiac history, chest pain or sob. Pt states she is not diabetic. 

## 2017-02-25 NOTE — Pre-Procedure Instructions (Signed)
Jacqueline Jenkins  02/25/2017    Your procedure is scheduled on Thursday, March 05, 2017 at 1:15 PM.   Report to [facility] Entrance "A" Admitting Office at 11:15 AM.   Call this number if you have problems the morning of surgery: [phone]   Questions prior to day of surgery, please call [phone] between 8 & 4 PM.   Remember:  Do not eat food or drink liquids after midnight Wednesday, 03/04/17.  Take these medicines the morning of surgery with A SIP OF WATER: Anastrozole (Arimidex)  Stop NSAIDS (Naproxen, Aleve, Ibuprofen, etc) 5 days prior to surgery. Do not use Aspirin products 5 days prior to surgery.   Do not wear jewelry, make-up or nail polish.  Do not wear lotions, powders, perfumes or deodorant.  Do not shave 48 hours prior to surgery.   Do not bring valuables to the hospital.  [facility] is not responsible for any belongings or valuables.  Contacts, dentures or bridgework may not be worn into surgery.  Leave your suitcase in the car.  After surgery it may be brought to your room.  For patients admitted to the hospital, discharge time will be determined by your treatment team.    [facility] - Preparing for Surgery  Before surgery, you can play an important role.  Because skin is not sterile, your skin needs to be as free of germs as possible.  You can reduce the number of germs on you skin by washing with CHG (chlorahexidine gluconate) soap before surgery.  CHG is an antiseptic cleaner which kills germs and bonds with the skin to continue killing germs even after washing.  Please DO NOT use if you have an allergy to CHG or antibacterial soaps.  If your skin becomes reddened/irritated stop using the CHG and inform your nurse when you arrive at Short Stay.  Do not shave (including legs and underarms) for at least 48 hours prior to the first CHG shower.  You may shave your face.  Please follow these instructions carefully:   1.  Shower with CHG Soap the night  before surgery and the                    morning of Surgery.  2.  If you choose to wash your hair, wash your hair first as usual with your       normal shampoo.  3.  After you shampoo, rinse your hair and body thoroughly to remove the shampoo.  4.  Use CHG as you would any other liquid soap.  You can apply chg directly       to the skin and wash gently with scrungie or a clean washcloth.  5.  Apply the CHG Soap to your body ONLY FROM THE NECK DOWN.        Do not use on open wounds or open sores.  Avoid contact with your eyes, ears, mouth and genitals (private parts).  Wash genitals (private parts) with your normal soap.  6.  Wash thoroughly, paying special attention to the area where your surgery        will be performed.  7.  Thoroughly rinse your body with warm water from the neck down.  8.  DO NOT shower/wash with your normal soap after using and rinsing off       the CHG Soap.  9.  Pat yourself dry with a clean towel.  10.  Wear clean pajamas.            11.  Place clean sheets on your bed the night of your first shower and do not        sleep with pets.  Day of Surgery  Do not apply any lotions/deodorants the morning of surgery.  Please wear clean clothes to the hospital.   Please read over the fact sheets that you were given.

## 2017-03-05 ENCOUNTER — Inpatient Hospital Stay (HOSPITAL_COMMUNITY): Payer: Medicare Other | Admitting: Anesthesiology

## 2017-03-05 ENCOUNTER — Inpatient Hospital Stay (HOSPITAL_COMMUNITY)
Admission: RE | Admit: 2017-03-05 | Discharge: 2017-03-06 | DRG: 983 | Disposition: A | Discharge status: Home / Self Care | Payer: Medicare Other | Source: Ambulatory Visit | Attending: Neurosurgery | Admitting: Neurosurgery

## 2017-03-05 ENCOUNTER — Encounter (HOSPITAL_COMMUNITY): Payer: Self-pay | Admitting: *Deleted

## 2017-03-05 ENCOUNTER — Encounter (HOSPITAL_COMMUNITY)
Admission: RE | Disposition: A | Discharge status: Home / Self Care | Payer: Self-pay | Source: Ambulatory Visit | Attending: Neurosurgery

## 2017-03-05 ENCOUNTER — Ambulatory Visit (HOSPITAL_COMMUNITY)
Admission: RE | Admit: 2017-03-05 | Discharge: 2017-03-05 | Disposition: A | Discharge status: Home / Self Care | Payer: Medicare Other | Source: Ambulatory Visit | Attending: Neurosurgery | Admitting: Neurosurgery

## 2017-03-05 DIAGNOSIS — Z923 Personal history of irradiation: Secondary | ICD-10-CM

## 2017-03-05 DIAGNOSIS — Z9221 Personal history of antineoplastic chemotherapy: Secondary | ICD-10-CM

## 2017-03-05 DIAGNOSIS — Z853 Personal history of malignant neoplasm of breast: Secondary | ICD-10-CM

## 2017-03-05 DIAGNOSIS — Z90722 Acquired absence of ovaries, bilateral: Secondary | ICD-10-CM | POA: Diagnosis not present

## 2017-03-05 DIAGNOSIS — Z9889 Other specified postprocedural states: Secondary | ICD-10-CM | POA: Diagnosis not present

## 2017-03-05 DIAGNOSIS — I728 Aneurysm of other specified arteries: Secondary | ICD-10-CM | POA: Diagnosis not present

## 2017-03-05 DIAGNOSIS — H93A1 Pulsatile tinnitus, right ear: Secondary | ICD-10-CM | POA: Diagnosis present

## 2017-03-05 DIAGNOSIS — I671 Cerebral aneurysm, nonruptured: Secondary | ICD-10-CM

## 2017-03-05 DIAGNOSIS — I770 Arteriovenous fistula, acquired: Secondary | ICD-10-CM

## 2017-03-05 DIAGNOSIS — Z79899 Other long term (current) drug therapy: Secondary | ICD-10-CM | POA: Diagnosis not present

## 2017-03-05 DIAGNOSIS — Z888 Allergy status to other drugs, medicaments and biological substances status: Secondary | ICD-10-CM | POA: Diagnosis not present

## 2017-03-05 DIAGNOSIS — C50412 Malignant neoplasm of upper-outer quadrant of left female breast: Secondary | ICD-10-CM | POA: Diagnosis not present

## 2017-03-05 DIAGNOSIS — I10 Essential (primary) hypertension: Secondary | ICD-10-CM | POA: Diagnosis not present

## 2017-03-05 HISTORY — PX: IR ANGIO INTRA EXTRACRAN SEL COM CAROTID INNOMINATE UNI R MOD SED: IMG5359

## 2017-03-05 HISTORY — PX: RADIOLOGY WITH ANESTHESIA: SHX6223

## 2017-03-05 HISTORY — PX: IR NEURO EACH ADD'L AFTER BASIC UNI RIGHT (MS): IMG5374

## 2017-03-05 HISTORY — PX: IR ANGIOGRAM FOLLOW UP STUDY: IMG697

## 2017-03-05 HISTORY — PX: IR TRANSCATH/EMBOLIZ: IMG695

## 2017-03-05 HISTORY — PX: IR ANGIO EXTERNAL CAROTID SEL EXT CAROTID UNI R MOD SED: IMG5371

## 2017-03-05 LAB — CREATININE, SERUM
Creatinine, Ser: 0.77 mg/dL (ref 0.44–1.00)
GFR calc Af Amer: >60 mL/min (ref >60)
GFR calc non Af Amer: >60 mL/min (ref >60)

## 2017-03-05 LAB — CBC
HEMATOCRIT: 36.5 % (ref 36.0–46.0)
HEMOGLOBIN: 12.1 g/dL (ref 12.0–15.0)
MCH: 30 pg (ref 26.0–34.0)
MCHC: 33.2 g/dL (ref 30.0–36.0)
MCV: 90.6 fL (ref 78.0–100.0)
Platelets: 233 10*3/uL (ref 150–400)
RBC: 4.03 MIL/uL (ref 3.87–5.11)
RDW: 13.4 % (ref 11.5–15.5)
WBC: 7.6 10*3/uL (ref 4.0–10.5)

## 2017-03-05 SURGERY — RADIOLOGY WITH ANESTHESIA
Anesthesia: General

## 2017-03-05 MED ORDER — CALCIUM CARB-CHOLECALCIFEROL 1000-800 MG-UNIT PO TABS: ORAL_TABLET | Freq: Every day | ORAL | Status: DC

## 2017-03-05 MED ORDER — NAPROXEN SODIUM 220 MG PO TABS: 220.0000 mg | ORAL_TABLET | Freq: Every day | ORAL | Status: DC | PRN

## 2017-03-05 MED ORDER — HEPARIN SODIUM (PORCINE) 5000 UNIT/ML IJ SOLN: 5000.0000 [IU] | Freq: Three times a day (TID) | INTRAMUSCULAR | Status: DC

## 2017-03-05 MED ORDER — BENAZEPRIL HCL 20 MG PO TABS: 20.0000 mg | ORAL_TABLET | Freq: Every day | ORAL | Status: DC

## 2017-03-05 MED ORDER — HYDROCHLOROTHIAZIDE 12.5 MG PO CAPS: 12.5000 mg | ORAL_CAPSULE | Freq: Every day | ORAL | Status: DC

## 2017-03-05 MED ORDER — ONDANSETRON HCL 4 MG/2ML IJ SOLN
4.0000 mg | INTRAMUSCULAR | Status: DC | PRN
Start: 2017-03-05 — End: 2017-03-06

## 2017-03-05 MED ORDER — CHLORHEXIDINE GLUCONATE CLOTH 2 % EX PADS: 6.0000 | MEDICATED_PAD | Freq: Once | CUTANEOUS | Status: DC

## 2017-03-05 MED ORDER — SUGAMMADEX SODIUM 200 MG/2ML IV SOLN
INTRAVENOUS | Status: DC | PRN
  Administered 2017-03-05: 200 mg via INTRAVENOUS

## 2017-03-05 MED ORDER — MIDAZOLAM HCL 5 MG/5ML IJ SOLN
INTRAMUSCULAR | Status: DC | PRN
  Administered 2017-03-05: 2 mg via INTRAVENOUS

## 2017-03-05 MED ORDER — VITAMIN D 1000 UNITS PO TABS: 1000.0000 [IU] | ORAL_TABLET | Freq: Every day | ORAL | Status: DC

## 2017-03-05 MED ORDER — SUCCINYLCHOLINE CHLORIDE 20 MG/ML IJ SOLN
INTRAMUSCULAR | Status: DC | PRN
  Administered 2017-03-05: 100 mg via INTRAVENOUS

## 2017-03-05 MED ORDER — ROCURONIUM BROMIDE 100 MG/10ML IV SOLN
INTRAVENOUS | Status: DC | PRN
  Administered 2017-03-05: 10 mg via INTRAVENOUS
  Administered 2017-03-05: 50 mg via INTRAVENOUS

## 2017-03-05 MED ORDER — HEPARIN SODIUM (PORCINE) 1000 UNIT/ML IJ SOLN
INTRAMUSCULAR | Status: DC | PRN
  Administered 2017-03-05: 1000 [IU] via INTRAVENOUS
  Administered 2017-03-05: 2000 [IU] via INTRAVENOUS

## 2017-03-05 MED ORDER — CEFAZOLIN SODIUM-DEXTROSE 2-4 GM/100ML-% IV SOLN
INTRAVENOUS | Status: AC
  Filled 2017-03-05: qty 100

## 2017-03-05 MED ORDER — EZETIMIBE 10 MG PO TABS
10.0000 mg | ORAL_TABLET | Freq: Every evening | ORAL | Status: DC
  Administered 2017-03-05: 10 mg via ORAL
  Filled 2017-03-05: qty 1

## 2017-03-05 MED ORDER — ONDANSETRON HCL 4 MG PO TABS
4.0000 mg | ORAL_TABLET | ORAL | Status: DC | PRN
Start: 2017-03-05 — End: 2017-03-06

## 2017-03-05 MED ORDER — PROPOFOL 10 MG/ML IV BOLUS
INTRAVENOUS | Status: DC | PRN
  Administered 2017-03-05: 40 mg via INTRAVENOUS
  Administered 2017-03-05: 130 mg via INTRAVENOUS

## 2017-03-05 MED ORDER — NAPROXEN 250 MG PO TABS
250.0000 mg | ORAL_TABLET | Freq: Every day | ORAL | Status: DC | PRN
  Administered 2017-03-06: 250 mg via ORAL
  Filled 2017-03-05 (×2): qty 1

## 2017-03-05 MED ORDER — CEFAZOLIN SODIUM-DEXTROSE 2-4 GM/100ML-% IV SOLN
2.0000 g | INTRAVENOUS | Status: AC
  Administered 2017-03-05: 2 g via INTRAVENOUS

## 2017-03-05 MED ORDER — FENTANYL CITRATE (PF) 250 MCG/5ML IJ SOLN
INTRAMUSCULAR | Status: DC | PRN
  Administered 2017-03-05 (×2): 100 ug via INTRAVENOUS

## 2017-03-05 MED ORDER — LABETALOL HCL 5 MG/ML IV SOLN: 10.0000 mg | INTRAVENOUS | Status: DC | PRN

## 2017-03-05 MED ORDER — BENAZEPRIL-HYDROCHLOROTHIAZIDE 20-12.5 MG PO TABS: 1.0000 | ORAL_TABLET | Freq: Every day | ORAL | Status: DC

## 2017-03-05 MED ORDER — CALCIUM CARBONATE-VITAMIN D 500-200 MG-UNIT PO TABS
2.0000 | ORAL_TABLET | Freq: Every day | ORAL | Status: DC
  Filled 2017-03-05: qty 2

## 2017-03-05 MED ORDER — DEXAMETHASONE SODIUM PHOSPHATE 10 MG/ML IJ SOLN
INTRAMUSCULAR | Status: DC | PRN
  Administered 2017-03-05: 10 mg via INTRAVENOUS

## 2017-03-05 MED ORDER — GLYCOPYRROLATE 0.2 MG/ML IJ SOLN
INTRAMUSCULAR | Status: DC | PRN
  Administered 2017-03-05: 0.2 mg via INTRAVENOUS

## 2017-03-05 MED ORDER — IOPAMIDOL (ISOVUE-300) INJECTION 61%
INTRAVENOUS | Status: AC
  Filled 2017-03-05: qty 150

## 2017-03-05 MED ORDER — ONDANSETRON HCL 4 MG/2ML IJ SOLN
INTRAMUSCULAR | Status: DC | PRN
  Administered 2017-03-05: 4 mg via INTRAVENOUS

## 2017-03-05 MED ORDER — SODIUM CHLORIDE 0.9 % IV SOLN
INTRAVENOUS | Status: DC
  Administered 2017-03-05: 18:00:00 via INTRAVENOUS

## 2017-03-05 MED ORDER — HEPARIN SODIUM (PORCINE) 5000 UNIT/ML IJ SOLN
5000.0000 [IU] | Freq: Three times a day (TID) | INTRAMUSCULAR | Status: DC
  Administered 2017-03-06: 5000 [IU] via SUBCUTANEOUS
  Filled 2017-03-05: qty 1

## 2017-03-05 MED ORDER — ALENDRONATE SODIUM 70 MG PO TABS: 70.0000 mg | ORAL_TABLET | ORAL | Status: DC

## 2017-03-05 MED ORDER — EPHEDRINE SULFATE 50 MG/ML IJ SOLN
INTRAMUSCULAR | Status: DC | PRN
  Administered 2017-03-05: 5 mg via INTRAVENOUS

## 2017-03-05 MED ORDER — PHENYLEPHRINE HCL 10 MG/ML IJ SOLN
INTRAVENOUS | Status: DC | PRN
  Administered 2017-03-05: 25 ug/min via INTRAVENOUS

## 2017-03-05 MED ORDER — ANASTROZOLE 1 MG PO TABS
1.0000 mg | ORAL_TABLET | Freq: Every day | ORAL | Status: DC
  Administered 2017-03-05: 1 mg via ORAL
  Filled 2017-03-05 (×3): qty 1

## 2017-03-05 MED ORDER — LIDOCAINE HCL (CARDIAC) 20 MG/ML IV SOLN
INTRAVENOUS | Status: DC | PRN
  Administered 2017-03-05: 60 mg via INTRATRACHEAL
  Administered 2017-03-05: 40 mg via INTRATRACHEAL

## 2017-03-05 MED ORDER — SODIUM CHLORIDE 0.9 % IV SOLN: INTRAVENOUS | Status: DC

## 2017-03-05 MED ORDER — LACTATED RINGERS IV SOLN
INTRAVENOUS | Status: DC
  Administered 2017-03-05 (×2): via INTRAVENOUS

## 2017-03-05 MED ORDER — HYDROCODONE-ACETAMINOPHEN 5-325 MG PO TABS: 1.0000 | ORAL_TABLET | ORAL | Status: DC | PRN

## 2017-03-05 MED ORDER — PANTOPRAZOLE SODIUM 40 MG IV SOLR
40.0000 mg | Freq: Every day | INTRAVENOUS | Status: DC
  Administered 2017-03-05: 40 mg via INTRAVENOUS
  Filled 2017-03-05: qty 40

## 2017-03-05 NOTE — Anesthesia Postprocedure Evaluation (Signed)
Anesthesia Post Note  Patient: Jacqueline Jenkins  Procedure(s) Performed: Procedure(s) (LRB): ARTERIOGRAM ONYX EMBOLIZATION OF FISTULA (N/A)     Patient location during evaluation: PACU Anesthesia Type: General Level of consciousness: awake and alert Pain management: pain level controlled Vital Signs Assessment: post-procedure vital signs reviewed and stable Respiratory status: spontaneous breathing, nonlabored ventilation, respiratory function stable and patient connected to nasal cannula oxygen Cardiovascular status: blood pressure returned to baseline and stable Postop Assessment: no signs of nausea or vomiting Anesthetic complications: no    Last Vitals:  Vitals:   03/05/17 1715 03/05/17 1721  BP:  120/67  Pulse: 83 87  Resp: 13 13  Temp:  (!) 36.2 C    Last Pain:  Vitals:   03/05/17 1715  TempSrc:   PainSc: 0-No pain    LLE Motor Response: Purposeful movement;Responds to commands (03/05/17 1715) LLE Sensation: Full sensation (03/05/17 1715) RLE Motor Response: Purposeful movement;Responds to commands (03/05/17 1715) RLE Sensation: Full sensation (03/05/17 1715)      Effie Berkshire

## 2017-03-05 NOTE — Progress Notes (Signed)
Patient admitted from PACU. Patient alert and oriented x 4. Patient oriented to room and made comfortable. Patient denies any pain at this time.

## 2017-03-05 NOTE — H&P (Signed)
CC:  Right pulsatile tinnitus  HPI: Jacqueline Jenkins is a 66 y.o. female with a history of intractable right-sided pulsatile tinnitus. This has prevented her from sleeping. She has not had any other neurologic changes. She underwent diagnostic cerebral angiogram which did demonstrate the presence of a right transverse-sigmoid dural AV fistula. She presents today for elective embolization.  PMH: Past Medical History:  Diagnosis Date  . Allergic rhinitis, cause unspecified   . Anemia    history of anemia while teenager  . BRCA2 positive   . Breast cancer of upper-outer quadrant of left female breast ([place])    chemo complete 01/2016, radiation 04/2016  . DDD (degenerative disc disease)   . Hyperlipidemia   . Hypertension   . Neuropathy   . Osteopenia 12/2011   Spine T -0.4, Femur -2.1  . PONV (postoperative nausea and vomiting)   . Splenic artery aneurysm ([place]) 2013   COILS DONE  . Vitamin D deficiency     PSH: Past Surgical History:  Procedure Laterality Date  . BREAST LUMPECTOMY     left 2016  . BREAST LUMPECTOMY WITH RADIOACTIVE SEED AND SENTINEL LYMPH NODE BIOPSY Left 08/29/2015   Procedure: LEFT BREAST LUMPECTOMY WITH RADIOACTIVE SEED WITH AXILLARY SENTINEL LYMPH NODE BIOPSY;  Surgeon: Excell Seltzer, MD;  Location: [facility];  Service: General;  Laterality: Left;  . [place]  . COLONOSCOPY  Jan. 7, 2014  . IR ANGIO EXTERNAL CAROTID SEL EXT CAROTID BILAT MOD SED  02/03/2017  . IR ANGIO INTRA EXTRACRAN SEL INTERNAL CAROTID BILAT MOD SED  02/03/2017  . IR ANGIO VERTEBRAL SEL VERTEBRAL BILAT MOD SED  02/03/2017  . IR NEURO EACH ADD'L AFTER BASIC UNI RIGHT (MS)  02/03/2017  . LAPAROSCOPIC BILATERAL SALPINGO OOPHERECTOMY Bilateral 05/16/2016   Procedure: LAPAROSCOPIC BILATERAL SALPINGO OOPHORECTOMY;  Surgeon: Paula Compton, MD;  Location: [facility];  Service: Gynecology;  Laterality: Bilateral;  . [place] SURGERY  2001, 2010  . PORTACATH PLACEMENT  Bilateral 10/09/2015   Procedure: INSERTION PORT-A-CATH;  Surgeon: Excell Seltzer, MD;  Location: [facility];  Service: General;  Laterality: Bilateral;  . Portacath removal    . RE-EXCISION OF BREAST LUMPECTOMY Left 09/07/2015   Procedure: RE-EXCISION OF LEFT BREAST LUMPECTOMY;  Surgeon: Excell Seltzer, MD;  Location: [place];  Service: General;  Laterality: Left;  . SPINE SURGERY  1998   cervical diskectomy  . SPINE SURGERY  2001, 2010   Lumbar diskectomy  . splenic aneurysm  02/10/2012   Aneurysm of splenic artery  . TONSILLECTOMY    . VISCERAL ANGIOGRAM N/A 02/10/2012   Procedure: VISCERAL ANGIOGRAM;  Surgeon: Serafina Mitchell, MD;  Location: [facility] C[facility];  Service: Cardiovascular;  Laterality: N/A;    SH: Social History  Substance Use Topics  . Smoking status: Never Smoker  . Smokeless tobacco: Never Used  . Alcohol use Yes     Comment: 3-4 times per year    MEDS: Prior to Admission medications   Medication Sig Start Date End Date Taking? Authorizing Provider  alendronate (FOSAMAX) 70 MG tablet Take 1 tablet (70 mg total) by mouth once a week. Take with a full glass of water on an empty stomach. Patient taking differently: Take 70 mg by mouth every 7 (seven) days. Take with a full glass of water on an empty stomach. Sundays 05/22/16  Yes Nicholas Lose, MD  anastrozole (ARIMIDEX) 1 MG tablet Take 1 tablet (1 mg total) by mouth daily. Patient taking  differently: Take 1 mg by mouth daily at 3 pm.  05/22/16  Yes Nicholas Lose, MD  benazepril-hydrochlorthiazide (LOTENSIN HCT) 20-12.5 MG tablet TAKE 1 TABLET BY MOUTH DAILY. 02/02/17  Yes Vicie Mutters, PA-C  Calcium Carb-Cholecalciferol (CALCIUM 1000 + D PO) Take 2 tablets by mouth daily. Gummies   Yes [provider]  cholecalciferol (VITAMIN D) 1000 units tablet Take 1,000 Units by mouth daily.   Yes [provider]  ezetimibe (ZETIA) 10 MG tablet Take 1 tablet daily for Cholesterol Patient taking  differently: Take 10 mg by mouth every evening.  12/12/16 07/14/17 Yes Unk Pinto, MD  naproxen sodium (ANAPROX) 220 MG tablet Take 220 mg by mouth daily as needed (pain).   Yes [provider]    ALLERGY: Allergies  Allergen Reactions  . Lescol [Fluvastatin Sodium] Other (See Comments)    Myalgia  . Lipitor [Atorvastatin] Other (See Comments)    Increased LFT's  . Vytorin [Ezetimibe-Simvastatin] Other (See Comments)    myalgia    ROS: ROS  NEUROLOGIC EXAM: Awake, alert, oriented Memory and concentration grossly intact Speech fluent, appropriate CN grossly intact Motor exam: Upper Extremities Deltoid Bicep Tricep Grip  Right 5/5 5/5 5/5 5/5  Left 5/5 5/5 5/5 5/5   Lower Extremity IP Quad PF DF EHL  Right 5/5 5/5 5/5 5/5 5/5  Left 5/5 5/5 5/5 5/5 5/5   Sensation grossly intact to LT   IMPRESSION: - 66 y.o. female with symptomatic right transverse-sigmoid dural AV fistular  PLAN: - Will proceed with Onyx embolization of right occipital artery  I have reviewed the indications, risks, benefits, and alternatives to the embolization in the office. All questions were answered.

## 2017-03-05 NOTE — Progress Notes (Signed)
Pt to PACU, groin WNL, pulses WNL; checked with RN. IR team signing off

## 2017-03-05 NOTE — Transfer of Care (Signed)
Immediate Anesthesia Transfer of Care Note  Patient: Jacqueline Jenkins  Procedure(s) Performed: Procedure(s): ARTERIOGRAM ONYX EMBOLIZATION OF FISTULA (N/A)  Patient Location: PACU  Anesthesia Type:General  Level of Consciousness: awake and patient cooperative  Airway & Oxygen Therapy: Patient Spontanous Breathing and Patient connected to nasal cannula oxygen  Post-op Assessment: Report given to RN and Post -op Vital signs reviewed and stable  Post vital signs: Reviewed and stable  Last Vitals:  Vitals:   03/05/17 1114 03/05/17 1653  BP: 139/66 118/61  Pulse: 76 85  Resp: 18 14  Temp: 36.9 C (!) 36.4 C    Last Pain:  Vitals:   03/05/17 1653  TempSrc:   PainSc: 0-No pain      Patients Stated Pain Goal: 3 (98/33/82 5053)  Complications: No apparent anesthesia complications

## 2017-03-05 NOTE — Anesthesia Preprocedure Evaluation (Signed)
Anesthesia Evaluation  Patient identified by MRN, date of birth, ID band Patient awake    Reviewed: Allergy & Precautions, NPO status , Patient's Chart, lab work & pertinent test results  History of Anesthesia Complications (+) PONV and history of anesthetic complications  Airway Mallampati: I  TM Distance: <3 FB Neck ROM: Full    Dental  (+) Teeth Intact, Dental Advisory Given   Pulmonary    breath sounds clear to auscultation       Cardiovascular hypertension, Pt. on medications + Peripheral Vascular Disease   Rhythm:Regular Rate:Normal     Neuro/Psych negative neurological ROS  negative psych ROS   GI/Hepatic negative GI ROS, Neg liver ROS,   Endo/Other  negative endocrine ROS  Renal/GU negative Renal ROS     Musculoskeletal  (+) Arthritis , Osteoarthritis,    Abdominal Normal abdominal exam  (+)   Peds  Hematology   Anesthesia Other Findings   Reproductive/Obstetrics negative OB ROS                             Lab Results  Component Value Date   WBC 6.9 02/25/2017   HGB 13.2 02/25/2017   HCT 39.4 02/25/2017   MCV 91.0 02/25/2017   PLT 243 02/25/2017   Lab Results  Component Value Date   CREATININE 0.77 02/25/2017   BUN 13 02/25/2017   NA 131 (L) 02/25/2017   K 3.6 02/25/2017   CL 98 (L) 02/25/2017   CO2 25 02/25/2017   Lab Results  Component Value Date   INR 0.99 02/25/2017   INR 1.03 02/03/2017    EKG: normal sinus rhythm.  Echo: - Left ventricle: The cavity size was normal. Wall thickness was   normal. Systolic function was normal. The estimated ejection   fraction was in the range of 55% to 60%. Wall motion was normal;   there were no regional wall motion abnormalities. Doppler   parameters are consistent with abnormal left ventricular   relaxation (grade 1 diastolic dysfunction). - Aortic valve: There was trivial regurgitation. - Left atrium: The atrium  was mildly dilated.  Anesthesia Physical Anesthesia Plan  ASA: II  Anesthesia Plan: General   Post-op Pain Management:    Induction: Intravenous  PONV Risk Score and Plan: 4 or greater and Ondansetron, Dexamethasone, Propofol, Midazolam and Scopolamine patch - Pre-op  Airway Management Planned: Oral ETT and Video Laryngoscope Planned  Additional Equipment: Arterial line  Intra-op Plan:   Post-operative Plan: Extubation in OR  Informed Consent: I have reviewed the patients History and Physical, chart, labs and discussed the procedure including the risks, benefits and alternatives for the proposed anesthesia with the patient or authorized representative who has indicated his/her understanding and acceptance.   Dental advisory given  Plan Discussed with: CRNA  Anesthesia Plan Comments:         Anesthesia Quick Evaluation

## 2017-03-05 NOTE — Anesthesia Procedure Notes (Addendum)
Procedure Name: Intubation Date/Time: 03/05/2017 1:49 PM Performed by: Mariea Clonts Pre-anesthesia Checklist: Patient identified, Emergency Drugs available, Suction available and Patient being monitored Patient Re-evaluated:Patient Re-evaluated prior to inductionOxygen Delivery Method: Circle System Utilized Preoxygenation: Pre-oxygenation with 100% oxygen Intubation Type: IV induction Ventilation: Oral airway inserted - appropriate to patient size and Two handed mask ventilation required Laryngoscope Size: Glidescope Grade View: Grade II Tube type: Oral Number of attempts: 1 Airway Equipment and Method: Stylet,  Oral airway and Video-laryngoscopy Placement Confirmation: ETT inserted through vocal cords under direct vision,  positive ETCO2 and breath sounds checked- equal and bilateral Tube secured with: Tape Dental Injury: Teeth and Oropharynx as per pre-operative assessment

## 2017-03-05 NOTE — Addendum Note (Signed)
Addendum  created 03/05/17 1731 by Effie Berkshire, MD   SmartForm saved

## 2017-03-05 NOTE — Brief Op Note (Signed)
PREOP DX:  Right transverse-sigmoid dural AVF  POSTOP DX:  Same  PROCEDURE:  Right occipital artery Onyx embolization  SURGEON: Dovid Bartko  ANESTHESIA: GETA  EBL: Minimal  SPECIMENS: None  DRAINS: None  COMPLICATIONS: None immediate  CONDITION: Stable to PACU  FINDINGS:  1. Onyx embolization of right occipital artery without further early venous drainage indicated obliteration of the fistula

## 2017-03-05 NOTE — Anesthesia Procedure Notes (Deleted)
Arterial Line Insertion Start/End7/12/2016 2:00 PM Performed by: anesthesiologist, CRNA  Preanesthetic checklist: patient identified, IV checked, risks and benefits discussed, surgical consent, monitors and equipment checked, pre-op evaluation and timeout performed radial was placed Catheter size: 20 G Hand hygiene performed  and maximum sterile barriers used  Allen's test indicative of satisfactory collateral circulation Attempts: 2 Procedure performed without using ultrasound guided technique. Following insertion, Biopatch and dressing applied. Post procedure assessment: normal

## 2017-03-05 NOTE — Anesthesia Procedure Notes (Signed)
Arterial Line Insertion Start/End7/12/2016 1:50 PM, 03/05/2017 1:53 PM Performed by: Suella Broad D, anesthesiologist  Patient location: Pre-op. Preanesthetic checklist: patient identified, IV checked, site marked, risks and benefits discussed, surgical consent, monitors and equipment checked, pre-op evaluation, timeout performed and anesthesia consent Lidocaine 1% used for infiltration Left, radial was placed Catheter size: 20 Fr Hand hygiene performed  and maximum sterile barriers used   Attempts: 1 Procedure performed without using ultrasound guided technique. Following insertion, dressing applied. Post procedure assessment: normal and unchanged  Additional procedure comments: R Radial attempted before my arrival. Currently has marginal pulse. Marland Kitchen

## 2017-03-06 ENCOUNTER — Encounter (HOSPITAL_COMMUNITY): Payer: Self-pay | Admitting: Neurosurgery

## 2017-03-06 MED ORDER — PANTOPRAZOLE SODIUM 40 MG PO TBEC: 40.0000 mg | DELAYED_RELEASE_TABLET | Freq: Every day | ORAL | Status: DC

## 2017-03-06 NOTE — Discharge Summary (Signed)
Physician Discharge Summary  Patient ID: Jacqueline Jenkins MRN: [id number] DOB/AGE: [birth year] 66 y.o.  Admit date: 03/05/2017 Discharge date: 03/06/2017  Admission Diagnoses:  Dural AV fistula  Discharge Diagnoses:  Same Active Problems:   Dural arteriovenous fistula  Discharged Condition: Stable  Hospital Course:  Jacqueline Jenkins is a 66 y.o. female who was admitted for the below procedure. There were no post operative complications. At time of discharge, she was pain free, ambulating, tolerating po, voiding normal. Ready for discharge. Her pre-operative tinnitus has completely resolved.  Treatments: Surgery Onyx embolization of right occipital artery without further early venous drainage indicated obliteration of the fistula  Discharge Exam: Blood pressure 130/66, pulse 78, temperature 98.2 F (36.8 C), temperature source Oral, resp. rate (!) 24, height 5\' 5"  (1.651 m), weight 75.8 kg (167 lb 1.7 oz), SpO2 99 %. Awake, alert, oriented Speech fluent, appropriate CN grossly intact 5/5 BUE/BLE Wound c/d/i  Disposition: 01-Home or Self Care  Discharge Instructions    Call MD for:  difficulty breathing, headache or visual disturbances    Complete by:  As directed    Call MD for:  persistant dizziness or light-headedness    Complete by:  As directed    Call MD for:  redness, tenderness, or signs of infection (pain, swelling, redness, odor or green/yellow discharge around incision site)    Complete by:  As directed    Call MD for:  severe uncontrolled pain    Complete by:  As directed    Call MD for:  temperature >100.4    Complete by:  As directed    Diet general    Complete by:  As directed    Driving Restrictions    Complete by:  As directed    Do not drive until given clearance.   Increase activity slowly    Complete by:  As directed    Lifting restrictions    Complete by:  As directed    Do not lift anything >10lbs. Avoid bending and twisting in awkward positions. Avoid  bending at the back.     Allergies as of 03/06/2017      Reactions   Lescol [fluvastatin Sodium] Other (See Comments)   Myalgia   Lipitor [atorvastatin] Other (See Comments)   Increased LFT's   Vytorin [ezetimibe-simvastatin] Other (See Comments)   myalgia      Medication List    TAKE these medications   alendronate 70 MG tablet Commonly known as:  FOSAMAX Take 1 tablet (70 mg total) by mouth once a week. Take with a full glass of water on an empty stomach. What changed:  when to take this  additional instructions   anastrozole 1 MG tablet Commonly known as:  ARIMIDEX Take 1 tablet (1 mg total) by mouth daily. What changed:  when to take this   benazepril-hydrochlorthiazide 20-12.5 MG tablet Commonly known as:  LOTENSIN HCT TAKE 1 TABLET BY MOUTH DAILY.   CALCIUM 1000 + D PO Take 2 tablets by mouth daily. Gummies   cholecalciferol 1000 units tablet Commonly known as:  VITAMIN D Take 1,000 Units by mouth daily.   ezetimibe 10 MG tablet Commonly known as:  ZETIA Take 1 tablet daily for Cholesterol What changed:  how much to take  how to take this  when to take this  additional instructions   naproxen sodium 220 MG tablet Commonly known as:  ANAPROX Take 220 mg by mouth daily as needed (pain).      Follow-up Information  Lisbeth Renshaw, MD. Schedule an appointment as soon as possible for a visit in 3 week(s).   Specialty:  Neurosurgery Contact information: [address] [place] [address] [phone]           Signed: Alyson Ingles 03/06/2017, 9:15 AM

## 2017-03-11 ENCOUNTER — Encounter (HOSPITAL_COMMUNITY): Payer: Self-pay | Admitting: Neurosurgery

## 2017-03-20 DIAGNOSIS — H25813 Combined forms of age-related cataract, bilateral: Secondary | ICD-10-CM | POA: Diagnosis not present

## 2017-03-23 ENCOUNTER — Encounter: Payer: Self-pay | Admitting: Hematology and Oncology

## 2017-03-23 ENCOUNTER — Ambulatory Visit (HOSPITAL_BASED_OUTPATIENT_CLINIC_OR_DEPARTMENT_OTHER): Payer: Medicare Other | Admitting: Hematology and Oncology

## 2017-03-23 VITALS — BP 118/63 | HR 70 | Temp 98.2°F | Resp 18 | Ht 65.0 in | Wt 167.4 lb

## 2017-03-23 DIAGNOSIS — Z1509 Genetic susceptibility to other malignant neoplasm: Secondary | ICD-10-CM

## 2017-03-23 DIAGNOSIS — Z1501 Genetic susceptibility to malignant neoplasm of breast: Secondary | ICD-10-CM

## 2017-03-23 DIAGNOSIS — C50412 Malignant neoplasm of upper-outer quadrant of left female breast: Secondary | ICD-10-CM | POA: Diagnosis not present

## 2017-03-23 DIAGNOSIS — Z170 Estrogen receptor positive status [ER+]: Secondary | ICD-10-CM | POA: Diagnosis not present

## 2017-03-23 MED ORDER — ANASTROZOLE 1 MG PO TABS: 1.0000 mg | ORAL_TABLET | Freq: Every day | ORAL | 3 refills | Status: DC

## 2017-03-23 NOTE — Assessment & Plan Note (Signed)
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 Patient does not need axillary lymph node dissection based on current NCCN guidelines and ACOSOG Z 11 clinical trial; luminal type B and high-risk phenotype. Risk of relapse without chemotherapy 24% versus 12% with chemotherapy.  Treatment summary: 1. Dose dense Adriamycin and Cytoxan 4 followed by Abraxane weekly 12  started 10/11/2015 completed 02/21/2016 2. Adjuvant radiation therapy started 03/27/2016 completed 04/23/2016 3. Adjuvant antiestrogen therapy started 05/22/2016 ----------------------------------------------------------------------------------------------------------------- Current treatment: Antiestrogen therapy with anastrozole 1 mg dailyto start 06/01/2016 BRCA2 mutation: Oophorectomy 05/16/2016: No malignancy  Anastrozole Toxicities:  Able to tolerate anastrozole fairly well. Denies any hot flashes or myalgias. The initial hot flashes have improved.  Surveillance of breast cancer: 1. Mammograms November 2017: Benign 2. patient will need a breast MRI probably in May 2019. (Because of BRCA2 mutation)  Patient had severe tinnitus (throbbing sound ) and was treated with embolization by Dr.Nundkumar with complete resolution of the symptom.  Return to clinic in 1 year for toxicity check and follow-up

## 2017-03-23 NOTE — Progress Notes (Signed)
Patient Care Team: Unk Pinto, MD as PCP - General (Internal Medicine) Ralene Bathe, MD as Consulting Physician (Ophthalmology) Serafina Mitchell, MD as Consulting Physician (Vascular Surgery) Carol Ada, MD as Consulting Physician (Gastroenterology) Martinique, Amy, MD as Consulting Physician (Dermatology) Nicholas Lose, MD as Consulting Physician (Hematology and Oncology) Excell Seltzer, MD as Consulting Physician (General Surgery)  DIAGNOSIS:  Encounter Diagnoses  Name Primary?  . Malignant neoplasm of upper-outer quadrant of left breast in female, estrogen receptor positive ([place]) Yes  . BRCA2 positive     SUMMARY OF ONCOLOGIC HISTORY:   Breast cancer of upper-outer quadrant of left female breast ([place])   08/03/2015 Mammogram    Left Breast: 6 mm mass @ 1 o clock      08/09/2015 Initial Diagnosis    Left breast 1:00 position: Invasive ductal carcinoma grade 1-2, ER 100%, PR 10%, HER-2 negative ratio 1.79, Ki-67 15%, T1 BN 0 stage I a clinical stage      08/29/2015 Surgery    Left lumpectomy ([facility]): invasive ductal carcinoma 1.2 cm with LVID, with DCIS intermediate grade, 1 sentinel node positive, ER 100%, PR 10%, HER-2 negative ratio 1.79, Ki-67 15% , margins negative, Mammaprint high risk, luminal B, T1cN1 stage II a      08/29/2015 Procedure    Mammaprint: High-risk, Luminal type      09/07/2015 Surgery    Left breast re-excision ([facility]): ADH, no malignancy, negative margins.       09/12/2015 Procedure    BRCA2 mutation "[id number]." Also VUS on CHEK2.  Otherwise negative. Genes analyzed:  ATM, BARD1, BRCA1, BRCA2, BRIP1, CDH1, CHEK2, FANCC, MLH1, MSH2, MSH6, NBN, PALB2, PMS2, PTEN, RAD51C, RAD51D, TP53, and XRCC2; deletion/duplication analysis (without sequencing) for EPCAM.      10/11/2015 - 02/21/2016 Chemotherapy    Adjuvant chemotherapy with dose dense Adriamycin and Cytoxan 4 followed by Abraxane weekly 12      03/27/2016 - 04/23/2016  Radiation Therapy    Adjuvant radiation therapy Lisbeth Renshaw). Left breast: 42.5 Gy in 17 fractions. Left breast boost: 7.5 Gy in 3 fractions.       05/16/2016 Surgery    Bilateral salpingo-oophorectomies: No cancer      06/05/2016 -  Anti-estrogen oral therapy    Anastrozole 1 mg by mouth daily       CHIEF COMPLIANT: Follow-up of BRCA mutation positive breast cancer on anastrozole therapy  INTERVAL HISTORY: GABRILLE Jenkins is a 66 year old with above-mentioned history of BRCA 2 mutation positive women lady who underwent left lumpectomy followed by adjuvant chemotherapy and radiation and bilateral salpingo-oophorectomy. She is currently on anastrozole therapy and is here for surveillance evaluation. She is tolerating anastrozole extremely well. She does not have any significant hot flashes or myalgias. She had a recent problem with tinnitus and had undergone embolization which led to complete resolution of her symptoms. She is extremely happy about it.  REVIEW OF SYSTEMS:   Constitutional: Denies fevers, chills or abnormal weight loss Eyes: Denies blurriness of vision Ears, nose, mouth, throat, and face: Denies mucositis or sore throat Respiratory: Denies cough, dyspnea or wheezes Cardiovascular: Denies palpitation, chest discomfort Gastrointestinal:  Denies nausea, heartburn or change in bowel habits Skin: Denies abnormal skin rashes Lymphatics: Denies new lymphadenopathy or easy bruising Neurological:Denies numbness, tingling or new weaknesses Behavioral/Psych: Mood is stable, no new changes  Extremities: No lower extremity edema Breast:  denies any pain or lumps or nodules in either breasts All other systems were reviewed with the patient and are negative.  I  have reviewed the past medical history, past surgical history, social history and family history with the patient and they are unchanged from previous note.  ALLERGIES:  is allergic to lescol [fluvastatin sodium]; lipitor  [atorvastatin]; and vytorin [ezetimibe-simvastatin].  MEDICATIONS:  Current Outpatient Prescriptions  Medication Sig Dispense Refill  . alendronate (FOSAMAX) 70 MG tablet Take 1 tablet (70 mg total) by mouth once a week. Take with a full glass of water on an empty stomach. (Patient taking differently: Take 70 mg by mouth every 7 (seven) days. Take with a full glass of water on an empty stomach. Sundays) 12 tablet 3  . anastrozole (ARIMIDEX) 1 MG tablet Take 1 tablet (1 mg total) by mouth daily at 3 pm. 90 tablet 3  . benazepril-hydrochlorthiazide (LOTENSIN HCT) 20-12.5 MG tablet TAKE 1 TABLET BY MOUTH DAILY. 90 tablet 0  . Calcium Carb-Cholecalciferol (CALCIUM 1000 + D PO) Take 2 tablets by mouth daily. Gummies    . cholecalciferol (VITAMIN D) 1000 units tablet Take 1,000 Units by mouth daily.    Marland Kitchen ezetimibe (ZETIA) 10 MG tablet Take 1 tablet daily for Cholesterol (Patient taking differently: Take 10 mg by mouth every evening. ) 90 tablet 1  . naproxen sodium (ANAPROX) 220 MG tablet Take 220 mg by mouth daily as needed (pain).     No current facility-administered medications for this visit.     PHYSICAL EXAMINATION: ECOG PERFORMANCE STATUS: 1 - Symptomatic but completely ambulatory  Vitals:   03/23/17 1200  BP: 118/63  Pulse: 70  Resp: 18  Temp: 98.2 F (36.8 C)   Filed Weights   03/23/17 1200  Weight: 167 lb 6.4 oz (75.9 kg)    GENERAL:alert, no distress and comfortable SKIN: skin color, texture, turgor are normal, no rashes or significant lesions EYES: normal, Conjunctiva are pink and non-injected, sclera clear OROPHARYNX:no exudate, no erythema and lips, buccal mucosa, and tongue normal  NECK: supple, thyroid normal size, non-tender, without nodularity LYMPH:  no palpable lymphadenopathy in the cervical, axillary or inguinal LUNGS: clear to auscultation and percussion with normal breathing effort HEART: regular rate & rhythm and no murmurs and no lower extremity  edema ABDOMEN:abdomen soft, non-tender and normal bowel sounds MUSCULOSKELETAL:no cyanosis of digits and no clubbing  NEURO: alert & oriented x 3 with fluent speech, no focal motor/sensory deficits EXTREMITIES: No lower extremity edema BREAST: No palpable masses or nodules in either right or left breasts. No palpable axillary supraclavicular or infraclavicular adenopathy no breast tenderness or nipple discharge. (exam performed in the presence of a chaperone)  LABORATORY DATA:  I have reviewed the data as listed   Chemistry      Component Value Date/Time   NA 131 (L) 02/25/2017 1430   NA 137 02/21/2016 1037   K 3.6 02/25/2017 1430   K 4.7 02/21/2016 1037   CL 98 (L) 02/25/2017 1430   CO2 25 02/25/2017 1430   CO2 26 02/21/2016 1037   BUN 13 02/25/2017 1430   BUN 15.8 02/21/2016 1037   CREATININE 0.77 03/05/2017 2315   CREATININE 0.72 12/11/2016 1708   CREATININE 0.8 02/21/2016 1037      Component Value Date/Time   CALCIUM 9.8 02/25/2017 1430   CALCIUM 9.9 02/21/2016 1037   ALKPHOS 59 12/11/2016 1708   ALKPHOS 54 02/21/2016 1037   AST 23 12/11/2016 1708   AST 18 02/21/2016 1037   ALT 19 12/11/2016 1708   ALT 17 02/21/2016 1037   BILITOT 0.3 12/11/2016 1708   BILITOT 0.38 02/21/2016 1037  Lab Results  Component Value Date   WBC 7.6 03/05/2017   HGB 12.1 03/05/2017   HCT 36.5 03/05/2017   MCV 90.6 03/05/2017   PLT 233 03/05/2017   NEUTROABS 5.0 02/25/2017    ASSESSMENT & PLAN:  Breast cancer of upper-outer quadrant of left female breast ([place]) Left lumpectomy 08/29/2015: Invasive ductal carcinoma 1.2 cm with LVID, with DCIS intermediate grade, 1 sentinel node positive, ER 100%, PR 10%, HER-2 negative ratio 1.79, Ki-67 15% , margins negative, Mammaprint high risk, luminal B, T1cN1 stage II a. Patient does not need axillary lymph node dissection based on current NCCN guidelines and ACOSOG Z 11 clinical trial; luminal type B and high-risk phenotype. Risk of relapse  without chemotherapy 24% versus 12% with chemotherapy.  Treatment summary: 1. Dose dense Adriamycin and Cytoxan 4 followed by Abraxane weekly 12  started 10/11/2015 completed 02/21/2016 2. Adjuvant radiation therapy started 03/27/2016 completed 04/23/2016 3. Adjuvant antiestrogen therapy started 05/22/2016 ----------------------------------------------------------------------------------------------------------------- Current treatment: Antiestrogen therapy with anastrozole 1 mg dailyto start 06/01/2016 BRCA2 mutation: Oophorectomy 05/16/2016: No malignancy  Anastrozole Toxicities:  Able to tolerate anastrozole fairly well. Denies any hot flashes or myalgias. The initial hot flashes have improved.  Surveillance of breast cancer: 1. Mammograms November 2017: Benign 2. patient will need a breast MRI probably in May 2019. (Because of BRCA2 mutation)  Patient had severe tinnitus (throbbing sound ) and was treated with embolization by Dr.Nundkumar with complete resolution of the symptom.  Return to clinic in 1 year for toxicity check and follow-up   I spent 25 minutes talking to the patient of which more than half was spent in counseling and coordination of care.  Orders Placed This Encounter  Procedures  . MR BREAST BILATERAL W WO CONTRAST    Standing Status:   Future    Standing Expiration Date:   05/23/2018    Order Specific Question:   If indicated for the ordered procedure, I authorize the administration of contrast media per Radiology protocol    Answer:   Yes    Order Specific Question:   Reason for Exam (SYMPTOM  OR DIAGNOSIS REQUIRED)    Answer:   Breast cancer BRCA 2 mutation positive    Order Specific Question:   Preferred imaging location?    Answer:   [facility] (table limit-350 lbs)    Order Specific Question:   Does the patient have a pacemaker or implanted devices?    Answer:   No    Order Specific Question:   What is the patient's sedation requirement?     Answer:   No Sedation   The patient has a good understanding of the overall plan. she agrees with it. she will call with any problems that may develop before the next visit here.   Rulon Eisenmenger, MD 03/23/17

## 2017-03-25 DIAGNOSIS — I671 Cerebral aneurysm, nonruptured: Secondary | ICD-10-CM | POA: Diagnosis not present

## 2017-03-25 DIAGNOSIS — R030 Elevated blood-pressure reading, without diagnosis of hypertension: Secondary | ICD-10-CM | POA: Diagnosis not present

## 2017-03-25 DIAGNOSIS — Z6827 Body mass index (BMI) 27.0-27.9, adult: Secondary | ICD-10-CM | POA: Diagnosis not present

## 2017-04-07 ENCOUNTER — Encounter: Payer: Self-pay | Admitting: Physician Assistant

## 2017-04-22 ENCOUNTER — Other Ambulatory Visit: Payer: Self-pay | Admitting: Physician Assistant

## 2017-05-27 DIAGNOSIS — Z23 Encounter for immunization: Secondary | ICD-10-CM | POA: Diagnosis not present

## 2017-06-17 ENCOUNTER — Encounter: Payer: Self-pay | Admitting: Physician Assistant

## 2017-06-17 ENCOUNTER — Ambulatory Visit (INDEPENDENT_AMBULATORY_CARE_PROVIDER_SITE_OTHER): Payer: Medicare Other | Admitting: Physician Assistant

## 2017-06-17 VITALS — BP 126/80 | HR 75 | Temp 97.5°F | Resp 14 | Ht 65.0 in | Wt 165.4 lb

## 2017-06-17 DIAGNOSIS — Z0001 Encounter for general adult medical examination with abnormal findings: Secondary | ICD-10-CM | POA: Diagnosis not present

## 2017-06-17 DIAGNOSIS — I10 Essential (primary) hypertension: Secondary | ICD-10-CM | POA: Diagnosis not present

## 2017-06-17 DIAGNOSIS — I728 Aneurysm of other specified arteries: Secondary | ICD-10-CM

## 2017-06-17 DIAGNOSIS — Z79899 Other long term (current) drug therapy: Secondary | ICD-10-CM

## 2017-06-17 DIAGNOSIS — M810 Age-related osteoporosis without current pathological fracture: Secondary | ICD-10-CM | POA: Insufficient documentation

## 2017-06-17 DIAGNOSIS — I714 Abdominal aortic aneurysm, without rupture, unspecified: Secondary | ICD-10-CM

## 2017-06-17 DIAGNOSIS — Z1501 Genetic susceptibility to malignant neoplasm of breast: Secondary | ICD-10-CM

## 2017-06-17 DIAGNOSIS — C50412 Malignant neoplasm of upper-outer quadrant of left female breast: Secondary | ICD-10-CM

## 2017-06-17 DIAGNOSIS — E785 Hyperlipidemia, unspecified: Secondary | ICD-10-CM | POA: Diagnosis not present

## 2017-06-17 DIAGNOSIS — Z23 Encounter for immunization: Secondary | ICD-10-CM

## 2017-06-17 DIAGNOSIS — Z136 Encounter for screening for cardiovascular disorders: Secondary | ICD-10-CM | POA: Diagnosis not present

## 2017-06-17 DIAGNOSIS — Z0000 Encounter for general adult medical examination without abnormal findings: Secondary | ICD-10-CM

## 2017-06-17 DIAGNOSIS — Z1509 Genetic susceptibility to other malignant neoplasm: Secondary | ICD-10-CM

## 2017-06-17 DIAGNOSIS — Z170 Estrogen receptor positive status [ER+]: Secondary | ICD-10-CM

## 2017-06-17 DIAGNOSIS — R6889 Other general symptoms and signs: Secondary | ICD-10-CM

## 2017-06-17 DIAGNOSIS — M8580 Other specified disorders of bone density and structure, unspecified site: Secondary | ICD-10-CM

## 2017-06-17 DIAGNOSIS — J0110 Acute frontal sinusitis, unspecified: Secondary | ICD-10-CM

## 2017-06-17 DIAGNOSIS — R7303 Prediabetes: Secondary | ICD-10-CM

## 2017-06-17 DIAGNOSIS — D649 Anemia, unspecified: Secondary | ICD-10-CM

## 2017-06-17 DIAGNOSIS — E559 Vitamin D deficiency, unspecified: Secondary | ICD-10-CM

## 2017-06-17 DIAGNOSIS — I671 Cerebral aneurysm, nonruptured: Secondary | ICD-10-CM

## 2017-06-17 MED ORDER — AZITHROMYCIN 250 MG PO TABS: ORAL_TABLET | ORAL | 1 refills | Status: AC

## 2017-06-17 NOTE — Patient Instructions (Addendum)
HOW TO TREAT VIRAL COUGH AND COLD SYMPTOMS:  -Symptoms usually last at least 1 week with the worst symptoms being around day 4.  - colds usually start with a sore throat and end with a cough, and the cough can take 2 weeks to get better.  -No antibiotics are needed for colds, flu, sore throats, cough, bronchitis UNLESS symptoms are longer than 7 days OR if you are getting better then get drastically worse.  -There are a lot of combination medications (Dayquil, Nyquil, Vicks 44, tyelnol cold and sinus, ETC). Please look at the ingredients on the back so that you are treating the correct symptoms and not doubling up on medications/ingredients.    Medicines you can use  Nasal congestion  - pseudoephedrine (Sudafed)- behind the counter, do not use if you have high blood pressure, medicine that have -D in them.  - phenylephrine (Sudafed PE) -Dextormethorphan + chlorpheniramine (Coridcidin HBP)- okay if you have high blood pressure -Oxymetazoline (Afrin) nasal spray- LIMIT to 3 days -Saline nasal spray -Neti pot (used distilled or bottled water)  Ear pain/congestion  -pseudoephedrine (sudafed) - Nasonex/flonase nasal spray  Fever  -Acetaminophen (Tyelnol) -Ibuprofen (Advil, motrin, aleve)  Sore Throat  -Acetaminophen (Tyelnol) -Ibuprofen (Advil, motrin, aleve) -Drink a lot of water -Gargle with salt water - Rest your voice (don't talk) -Throat sprays -Cough drops  Body Aches  -Acetaminophen (Tyelnol) -Ibuprofen (Advil, motrin, aleve)  Headache  -Acetaminophen (Tyelnol) -Ibuprofen (Advil, motrin, aleve) - Exedrin, Exedrin Migraine  Allergy symptoms (cough, sneeze, runny nose, itchy eyes) -Claritin or loratadine cheapest but likely the weakest  -Zyrtec or certizine at night because it can make you sleepy -The strongest is allegra or fexafinadine  Cheapest at [employer], [employer], [employer]  Cough  -Dextromethorphan (Delsym)- medicine that has DM in it -Guafenesin  (Mucinex/Robitussin) - cough drops - drink lots of water  Chest Congestion  -Guafenesin (Mucinex/Robitussin)  Red Itchy Eyes  - Naphcon-A  Upset Stomach  - Bland diet (nothing spicy, greasy, fried, and high acid foods like tomatoes, oranges, berries) -OKAY- cereal, bread, soup, crackers, rice -Eat smaller more frequent meals -reduce caffeine, no alcohol -Loperamide (Imodium-AD) if diarrhea -Prevacid for heart burn  General health when sick  -Hydration -wash your hands frequently -keep surfaces clean -change pillow cases and sheets often -Get fresh air but do not exercise strenuously -Vitamin D, double up on it - Vitamin C -Zinc       Vitamin D goal is between 60-80  Please make sure that you are taking your Vitamin D as directed.   It is very important as a natural anti-inflammatory   helping hair, skin, and nails, as well as reducing stroke and heart attack risk.   It helps your bones and helps with mood.  We want you on at least 5000 IU daily  It also decreases numerous cancer risks so please take it as directed.   Low Vit D is associated with a 200-300% higher risk for CANCER   and 200-300% higher risk for HEART   ATTACK  &  STROKE.    .....................................Marland Kitchen  It is also associated with higher death rate at younger ages,   autoimmune diseases like Rheumatoid arthritis, Lupus, Multiple Sclerosis.     Also many other serious conditions, like depression, Alzheimer's  Dementia, infertility, muscle aches, fatigue, fibromyalgia - just to name a few.  +++++++++++++++++++  Can get liquid vitamin D from [place] here in [place] at  [facility] alternatives [address], [place], [place] [address] Or  you can try earth fare       Bad carbs also include fruit juice, alcohol, and sweet tea. These are empty calories that do not signal to your brain that you are full.   Please remember the good carbs are still carbs which convert into sugar.  So please measure them out no more than 1/2-1 cup of rice, oatmeal, pasta, and beans  Veggies are however free foods! Pile them on.   Not all fruit is created equal. Please see the list below, the fruit at the bottom is higher in sugars than the fruit at the top. Please avoid all dried fruits.

## 2017-06-17 NOTE — Progress Notes (Signed)
Patient ID: Jacqueline Jenkins, female   DOB: September 04, 1950, 66 y.o.   MRN: [id number]   [facility] VISIT AND CPE  Assessment:   Aneurysm of splenic artery (HCC) Control blood pressure, cholesterol, glucose, increase exercise.   Aneurysm of abdominal vessel (HCC) Control blood pressure, cholesterol, glucose, increase exercise.   Essential hypertension - continue medications, DASH diet, exercise and monitor at home. Call if greater than 130/80.  -     CBC with Differential/Platelet -     BASIC METABOLIC PANEL WITH GFR -     Hepatic function panel -     TSH -     Urinalysis, Routine w reflex microscopic -     Microalbumin / creatinine urine ratio -     EKG 12-Lead  Hyperlipidemia, unspecified hyperlipidemia type -continue medications, check lipids, decrease fatty foods, increase activity.  -     Lipid panel  Prediabetes Discussed general issues about diabetes pathophysiology and management., Educational material distributed., Suggested low cholesterol diet., Encouraged aerobic exercise., Discussed foot care., Reminded to get yearly retinal exam.  Medication management -     Magnesium  Malignant neoplasm of upper-outer quadrant of left breast in female, estrogen receptor positive ([place]) Continue follow up Dr. Lindi Adie  Anemia, unspecified type Monitor CBC  Vitamin D deficiency -     VITAMIN D 25 Hydroxy (Vit-D Deficiency, Fractures)  BRCA2 positive Continue follow up  Dural arteriovenous fistula Control blood pressure, cholesterol, glucose, increase exercise.   Need for prophylactic vaccination against Streptococcus pneumoniae (pneumococcus) -     Pneumococcal conjugate vaccine 13-valent IM  Encounter for Medicare annual wellness exam 1 year  Acute non-recurrent frontal sinusitis -     azithromycin (ZITHROMAX) 250 MG tablet; Take 2 tablets (500 mg) on  Day 1,  followed by 1 tablet (250 mg) once daily on Days 2 through 5. Will hold the zpak and take if she is not  getting better, increase fluids, rest, cont allergy pill  Osteopenia - get dexa, continue Vit D and Ca, weight bearing exercises   Plan:   During the course of the visit the patient was educated and counseled about appropriate screening and preventive services including:    Pneumococcal vaccine   Influenza vaccine  Td vaccine  Screening electrocardiogram  Bone densitometry screening  Colorectal cancer screening  Diabetes screening  Glaucoma screening  Nutrition counseling   Advanced directives: requested   Subjective:   Jacqueline Jenkins is a 66 y.o.  female who presents for Medicare Annual Wellness Visit and complete physical.    She has had elevated blood pressure without the diagnosis of HTN.   Her blood pressure has been controlled at home, & today their BP is BP: 126/80 She does workout. She is adding in walking to her daily schedule.  She denies chest pain, shortness of breath, dizziness. No leg swelling. She follows with Dr. Abbie Sons for  large 5 cm splenic artery aneurysm.  She underwent: Embolization in June 2013.  She has history of L breast lumpectomy (+ER/+PR/HER2 Neg)  in Dec 2016 followed by chemoradiation, underwent BSO due to + BRCA2 mutation, on anastrazole, and follows with Dr. Lindi Adie. Some bilateral shoulder pain, no hip pain.  She is on cholesterol medication, zetia and denies myalgias. Her cholesterol is not at goal. The cholesterol last visit was:  Lab Results  Component Value Date   CHOL 271 (H) 12/11/2016   HDL 59 12/11/2016   LDLCALC 165 (H) 12/11/2016   TRIG 236 (H) 12/11/2016  CHOLHDL 4.6 12/11/2016   She has had prediabetes.  She has not been working on diet and exercise for prediabetes, and denies foot ulcerations, hyperglycemia, hypoglycemia , increased appetite, nausea, paresthesia of the feet, polydipsia, polyuria, visual disturbances, vomiting and weight loss. Last A1C in the office was:  Lab Results  Component Value Date   HGBA1C 5.4  12/11/2016   Patient is on Vitamin D supplement,  2000 IU daily.   Lab Results  Component Value Date   VD25OH 27 (L) 12/11/2016     BMI is Body mass index is 27.52 kg/m., she is working on diet and exercise. Wt Readings from Last 3 Encounters:  06/17/17 165 lb 6.4 oz (75 kg)  03/23/17 167 lb 6.4 oz (75.9 kg)  03/05/17 167 lb 1.7 oz (75.8 kg)    Medication Review: Current Outpatient Prescriptions on File Prior to Visit  Medication Sig Dispense Refill  . alendronate (FOSAMAX) 70 MG tablet Take 1 tablet (70 mg total) by mouth once a week. Take with a full glass of water on an empty stomach. (Patient taking differently: Take 70 mg by mouth every 7 (seven) days. Take with a full glass of water on an empty stomach. Sundays) 12 tablet 3  . anastrozole (ARIMIDEX) 1 MG tablet Take 1 tablet (1 mg total) by mouth daily at 3 pm. 90 tablet 3  . benazepril-hydrochlorthiazide (LOTENSIN HCT) 20-12.5 MG tablet TAKE 1 TABLET BY MOUTH DAILY. 90 tablet 0  . Calcium Carb-Cholecalciferol (CALCIUM 1000 + D PO) Take 2 tablets by mouth daily. Gummies    . cholecalciferol (VITAMIN D) 1000 units tablet Take 1,000 Units by mouth daily.    Marland Kitchen ezetimibe (ZETIA) 10 MG tablet Take 1 tablet daily for Cholesterol (Patient taking differently: Take 10 mg by mouth every evening. ) 90 tablet 1  . naproxen sodium (ANAPROX) 220 MG tablet Take 220 mg by mouth daily as needed (pain).     No current facility-administered medications on file prior to visit.     Current Problems (verified) Patient Active Problem List   Diagnosis Date Noted  . Osteopenia 06/17/2017  . Dural arteriovenous fistula 03/05/2017  . Fatigue due to treatment 10/18/2015  . Genetic testing 09/19/2015  . BRCA2 positive 09/19/2015  . Breast cancer of upper-outer quadrant of left female breast ([place]) 08/21/2015  . Medication management 06/07/2015  . HTN (hypertension) 06/07/2015  . BMI 27.72,  adult 06/07/2015  . Prediabetes 11/16/2013  . Allergic  rhinitis, cause unspecified   . Hyperlipidemia   . DDD (degenerative disc disease)   . Anemia   . Vitamin D deficiency   . Aneurysm of abdominal vessel ([place]) 01/19/2012  . Aneurysm of splenic artery ([place]) 01/19/2012    Screening Tests Immunization History  Administered Date(s) Administered  . DT 01/22/2015  . Influenza Split 06/08/2014, 06/02/2016  . Influenza, High Dose Seasonal PF 06/08/2014  . Influenza-Unspecified 06/14/2013, 06/04/2015, 05/27/2017  . Meningococcal Conjugate 09/02/2011  . PPD Test 01/18/2014  . Pneumococcal Conjugate-13 12/31/2011, 06/17/2017  . Pneumococcal-Unspecified 09/22/1995, 09/02/2011  . Td 11/27/1994, 04/29/2004, 01/22/2015   UTD on vaccines, prevnar today  Preventative care: Last colonoscopy: 2013 DEXA 07/2015 on fosamax x 1 year MGM 01/2017 PAP 2018 Echo 2017 US carotid 11/2016 [place] AB 2013  Follows Dr. Marvel [facility]  Names of Other Physician/Practitioners you currently use: 1. [facility] here for primary care 2. Dr. Kathrin Penner, eye doctor, last visit May 2018 3. Dr. Jason Coop, dentist, last visit Jan 2018 Patient  Care Team: Unk Pinto, MD as PCP - General (Internal Medicine) Ralene Bathe, MD as Consulting Physician (Ophthalmology) Serafina Mitchell, MD as Consulting Physician (Vascular Surgery) Carol Ada, MD as Consulting Physician (Gastroenterology) Martinique, Amy, MD as Consulting Physician (Dermatology) Nicholas Lose, MD as Consulting Physician (Hematology and Oncology) Excell Seltzer, MD as Consulting Physician (General Surgery)  History reviewed: allergies, current medications, past family history, past medical history, past social history, past surgical history and problem list Allergies Allergies  Allergen Reactions  . Lescol [Fluvastatin Sodium] Other (See Comments)    Myalgia  . Lipitor [Atorvastatin] Other (See Comments)    Increased LFT's  . Vytorin  [Ezetimibe-Simvastatin] Other (See Comments)    myalgia    SURGICAL HISTORY She  has a past surgical history that includes Tonsillectomy; Spine surgery (1998); Spine surgery (2001, 2010); Cervical disc surgery (1998); Lumbar disc surgery (2001, 2010); splenic aneurysm (02/10/2012); Colonoscopy (Jan. 7, 2014); visceral angiogram (N/A, 02/10/2012); Breast lumpectomy with radioactive seed and sentinel lymph node biopsy (Left, 08/29/2015); Re-excision of breast lumpectomy (Left, 09/07/2015); Portacath placement (Bilateral, 10/09/2015); Portacath removal; Laparoscopic bilateral salpingo oophorectomy (Bilateral, 05/16/2016); IR ANGIO INTRA EXTRACRAN SEL INTERNAL CAROTID BILAT MOD SED (02/03/2017); IR NEURO EACH ADD'L AFTER BASIC UNI RIGHT (MS) (02/03/2017); IR ANGIO VERTEBRAL SEL VERTEBRAL BILAT MOD SED (02/03/2017); IR ANGIO EXTERNAL CAROTID SEL EXT CAROTID BILAT MOD SED (02/03/2017); Breast lumpectomy; Radiology with anesthesia (N/A, 03/05/2017); IR NEURO EACH ADD'L AFTER BASIC UNI RIGHT (MS) (03/05/2017); IR Angiogram Follow Up Study (03/05/2017); IR Transcath/Emboliz (03/05/2017); IR ANGIO INTRA EXTRACRAN SEL COM CAROTID INNOMINATE UNI R MOD SED (03/05/2017); and IR ANGIO EXTERNAL CAROTID SEL EXT CAROTID UNI R MOD SED (03/05/2017).   FAMILY HISTORY Her family history includes Breast cancer in her cousin and maternal grandmother; Colon cancer in her maternal uncle; Deep vein thrombosis in her mother; Diverticulitis in her mother; Heart Problems in her paternal grandfather; Heart attack in her father, paternal aunt, paternal grandmother, and paternal uncle; Heart defect in her maternal aunt; Hyperlipidemia in her mother; Hypertension in her father and mother; Leukemia in her maternal aunt; Lung cancer in her maternal uncle; Osteoporosis in her mother; Other in her father, mother, and sister; Pancreatic cancer in her maternal uncle; Stroke in her father, maternal grandfather, mother, paternal aunt, paternal grandmother, and paternal uncle.    SOCIAL HISTORY She  reports that she has never smoked. She has never used smokeless tobacco. She reports that she drinks alcohol. She reports that she does not use drugs.  MEDICARE WELLNESS OBJECTIVES: Physical activity: Current Exercise Habits: The patient does not participate in regular exercise at present Cardiac risk factors: Cardiac Risk Factors include: advanced age (>31mn, >>42women);dyslipidemia;hypertension;sedentary lifestyle Depression/mood screen:   [facility] 06/17/2017  Decreased Interest 0  Down, Depressed, Hopeless 0  PHQ - 2 Score 0    ADLs:  In your present state of health, do you have any difficulty performing the following activities: 06/17/2017 03/05/2017  Hearing? N N  Vision? N N  Comment - -  Difficulty concentrating or making decisions? N N  Walking or climbing stairs? N N  Dressing or bathing? N N  Doing errands, shopping? N N  Some recent data might be hidden     Cognitive Testing  Alert? Yes  Normal Appearance?Yes  Oriented to person? Yes  Place? Yes   Time? Yes  Recall of three objects?  Yes  Can perform simple calculations? Yes  Displays appropriate judgment?Yes  Can read the correct time from a watch  face?Yes  EOL planning: Does Patient Have a Medical Advance Directive?: Yes Type of Advance Directive: Healthcare Power of Attorney, Living will [place] in Chart?: No - copy requested    Review of Systems  Constitutional: Positive for malaise/fatigue. Negative for chills, fever and weight loss.  HENT: Negative for congestion, ear pain, nosebleeds and sore throat.   Eyes: Negative.   Respiratory: Negative for cough, shortness of breath and wheezing.   Cardiovascular: Negative for chest pain, palpitations and leg swelling.  Gastrointestinal: Positive for heartburn (occasionally). Negative for blood in stool, constipation, diarrhea, melena, nausea and vomiting.  Genitourinary: Positive for frequency.  Negative for dysuria and urgency.  Musculoskeletal: Negative.   Skin: Negative.   Neurological: Negative for dizziness, sensory change, loss of consciousness and headaches.  Psychiatric/Behavioral: Negative for depression. The patient is not nervous/anxious and does not have insomnia.      Objective:     BP 126/80   Pulse 75   Temp (!) 97.5 F (36.4 C)   Resp 14   Ht 5' 5"  (1.651 m)   Wt 165 lb 6.4 oz (75 kg)   SpO2 99%   BMI 27.52 kg/m   General Appearance: Well nourished, alert, WD/WN, female and in no apparent distress. Eyes: PERRLA, EOMs, conjunctiva no swelling or erythema, normal fundi and vessels. Sinuses: No frontal/maxillary tenderness ENT/Mouth: EACs patent / TMs  nl. Nares clear without erythema, swelling, mucoid exudates. Oral hygiene is good. No erythema, swelling, or exudate. Tongue normal, non-obstructing. Tonsils not swollen or erythematous. Hearing normal.  Neck: Supple, thyroid normal. No bruits, nodes or JVD. Respiratory: Respiratory effort normal.  BS equal and clear bilateral without rales, rhonci, wheezing or stridor. Cardio: Heart sounds are normal with regular rate and rhythm and no murmurs, rubs or gallops. Peripheral pulses are normal and equal bilaterally without edema. No aortic or femoral bruits. Chest: symmetric with normal excursions and percussion. Breasts: Symmetric, without lumps, nipple discharge, retractions, or fibrocystic changes.  Abdomen: Flat, soft  with nl bowel sounds. Nontender, no guarding, rebound, hernias, masses, or organomegaly.  Lymphatics: Non tender without lymphadenopathy.  Genitourinary: Deferred to ObGyn Musculoskeletal: Full ROM all peripheral extremities, joint stability, 5/5 strength, and normal gait. Skin: Warm and dry without rashes, lesions, cyanosis, clubbing or  ecchymosis. Multiple seborrheic keratosis across the chest and the abdomen. Neuro: Cranial nerves intact, reflexes equal bilaterally. Normal muscle tone, no  cerebellar symptoms. Sensation intact.  Pysch: Alert and oriented X 3, normal affect, Insight and Judgment appropriate.    Medicare Attestation I have personally reviewed: The patient's medical and social history Their use of alcohol, tobacco or illicit drugs Their current medications and supplements The patient's functional ability including ADLs,fall risks, home safety risks, cognitive, and hearing and visual impairment Diet and physical activities Evidence for depression or mood disorders  The patient's weight, height, BMI, and visual acuity have been recorded in the chart.  I have made referrals, counseling, and provided education to the patient based on review of the above and I have provided the patient with a written personalized care plan for preventive services.  Over 40 minutes of exam, counseling, chart review was performed.   Vicie Mutters, PA-C   06/17/2017

## 2017-06-18 LAB — URINALYSIS, ROUTINE W REFLEX MICROSCOPIC
BILIRUBIN URINE: NEGATIVE
Bacteria, UA: NONE SEEN /HPF
GLUCOSE, UA: NEGATIVE
Hgb urine dipstick: NEGATIVE
Hyaline Cast: NONE SEEN /LPF
NITRITE: NEGATIVE
Protein, ur: NEGATIVE
RBC / HPF: NONE SEEN /HPF (ref 0–2)
SPECIFIC GRAVITY, URINE: 1.011 (ref 1.001–1.03)
Squamous Epithelial / LPF: NONE SEEN /HPF (ref <=5)
pH: 7 (ref 5.0–8.0)

## 2017-06-18 LAB — CBC WITH DIFFERENTIAL/PLATELET
BASOS PCT: 1.6 %
Basophils Absolute: 114 cells/uL (ref 0–200)
EOS ABS: 249 {cells}/uL (ref 15–500)
Eosinophils Relative: 3.5 %
HCT: 41.1 % (ref 35.0–45.0)
HEMOGLOBIN: 13.6 g/dL (ref 11.7–15.5)
LYMPHS ABS: 1136 {cells}/uL (ref 850–3900)
MCH: 29.6 pg (ref 27.0–33.0)
MCHC: 33.1 g/dL (ref 32.0–36.0)
MCV: 89.3 fL (ref 80.0–100.0)
MONOS PCT: 8.1 %
MPV: 9.8 fL (ref 7.5–12.5)
NEUTROS ABS: 5027 {cells}/uL (ref 1500–7800)
Neutrophils Relative %: 70.8 %
PLATELETS: 323 10*3/uL (ref 140–400)
RBC: 4.6 10*6/uL (ref 3.80–5.10)
RDW: 12.6 % (ref 11.0–15.0)
Total Lymphocyte: 16 %
WBC: 7.1 10*3/uL (ref 3.8–10.8)
WBCMIX: 575 {cells}/uL (ref 200–950)

## 2017-06-18 LAB — BASIC METABOLIC PANEL WITH GFR
BUN: 15 mg/dL (ref 7–25)
CO2: 21 mmol/L (ref 20–32)
CREATININE: 0.79 mg/dL (ref 0.50–0.99)
Calcium: 10.1 mg/dL (ref 8.6–10.4)
Chloride: 95 mmol/L — ABNORMAL LOW (ref 98–110)
GFR, EST NON AFRICAN AMERICAN: 77 mL/min/{1.73_m2} (ref >=60)
GFR, Est African American: 89 mL/min/{1.73_m2} (ref >=60)
Glucose, Bld: 89 mg/dL (ref 65–99)
Potassium: 4.2 mmol/L (ref 3.5–5.3)
SODIUM: 133 mmol/L — AB (ref 135–146)

## 2017-06-18 LAB — HEPATIC FUNCTION PANEL
AG RATIO: 1.6 (calc) (ref 1.0–2.5)
ALKALINE PHOSPHATASE (APISO): 66 U/L (ref 33–130)
ALT: 22 U/L (ref 6–29)
AST: 24 U/L (ref 10–35)
Albumin: 4.5 g/dL (ref 3.6–5.1)
BILIRUBIN INDIRECT: 0.5 mg/dL (ref 0.2–1.2)
BILIRUBIN TOTAL: 0.6 mg/dL (ref 0.2–1.2)
Bilirubin, Direct: 0.1 mg/dL (ref 0.0–0.2)
Globulin: 2.8 g/dL (calc) (ref 1.9–3.7)
Total Protein: 7.3 g/dL (ref 6.1–8.1)

## 2017-06-18 LAB — MICROALBUMIN / CREATININE URINE RATIO
CREATININE, URINE: 48 mg/dL (ref 20–275)
Microalb Creat Ratio: 4 mcg/mg creat (ref <30)
Microalb, Ur: 0.2 mg/dL

## 2017-06-18 LAB — VITAMIN D 25 HYDROXY (VIT D DEFICIENCY, FRACTURES): VIT D 25 HYDROXY: 26 ng/mL — AB (ref 30–100)

## 2017-06-18 LAB — LIPID PANEL
Cholesterol: 218 mg/dL — ABNORMAL HIGH (ref <200)
HDL: 82 mg/dL (ref >50)
LDL Cholesterol (Calc): 119 mg/dL (calc) — ABNORMAL HIGH
NON-HDL CHOLESTEROL (CALC): 136 mg/dL — AB (ref <130)
Total CHOL/HDL Ratio: 2.7 (calc) (ref <5.0)
Triglycerides: 73 mg/dL (ref <150)

## 2017-06-18 LAB — TSH: TSH: 1.04 m[IU]/L (ref 0.40–4.50)

## 2017-06-18 LAB — MAGNESIUM: MAGNESIUM: 1.8 mg/dL (ref 1.5–2.5)

## 2017-07-03 ENCOUNTER — Other Ambulatory Visit: Payer: Self-pay | Admitting: Hematology and Oncology

## 2017-07-09 ENCOUNTER — Other Ambulatory Visit: Payer: Self-pay | Admitting: Internal Medicine

## 2017-07-09 DIAGNOSIS — E782 Mixed hyperlipidemia: Secondary | ICD-10-CM

## 2017-07-21 ENCOUNTER — Other Ambulatory Visit: Payer: Self-pay | Admitting: Physician Assistant

## 2017-09-04 ENCOUNTER — Ambulatory Visit
Admission: RE | Admit: 2017-09-04 | Discharge: 2017-09-04 | Disposition: A | Discharge status: Home / Self Care | Payer: Medicare Other | Source: Ambulatory Visit | Attending: Hematology and Oncology | Admitting: Hematology and Oncology

## 2017-09-04 ENCOUNTER — Other Ambulatory Visit: Payer: Medicare Other

## 2017-09-04 DIAGNOSIS — R921 Mammographic calcification found on diagnostic imaging of breast: Secondary | ICD-10-CM | POA: Diagnosis not present

## 2017-09-11 DIAGNOSIS — H25813 Combined forms of age-related cataract, bilateral: Secondary | ICD-10-CM | POA: Diagnosis not present

## 2017-09-11 DIAGNOSIS — H43813 Vitreous degeneration, bilateral: Secondary | ICD-10-CM | POA: Diagnosis not present

## 2017-09-11 DIAGNOSIS — H52203 Unspecified astigmatism, bilateral: Secondary | ICD-10-CM | POA: Diagnosis not present

## 2017-09-11 DIAGNOSIS — H04123 Dry eye syndrome of bilateral lacrimal glands: Secondary | ICD-10-CM | POA: Diagnosis not present

## 2017-10-14 ENCOUNTER — Other Ambulatory Visit: Payer: Self-pay

## 2017-10-14 MED ORDER — BENAZEPRIL-HYDROCHLOROTHIAZIDE 20-12.5 MG PO TABS: 1.0000 | ORAL_TABLET | Freq: Every day | ORAL | 0 refills | Status: DC

## 2017-10-28 DIAGNOSIS — Z6829 Body mass index (BMI) 29.0-29.9, adult: Secondary | ICD-10-CM | POA: Diagnosis not present

## 2017-10-28 DIAGNOSIS — Z01419 Encounter for gynecological examination (general) (routine) without abnormal findings: Secondary | ICD-10-CM | POA: Diagnosis not present

## 2017-12-28 ENCOUNTER — Ambulatory Visit (INDEPENDENT_AMBULATORY_CARE_PROVIDER_SITE_OTHER): Payer: Medicare Other | Admitting: Internal Medicine

## 2017-12-28 ENCOUNTER — Encounter: Payer: Self-pay | Admitting: Internal Medicine

## 2017-12-28 VITALS — BP 120/74 | HR 72 | Temp 97.9°F | Ht 65.0 in | Wt 175.0 lb

## 2017-12-28 DIAGNOSIS — Z79899 Other long term (current) drug therapy: Secondary | ICD-10-CM | POA: Diagnosis not present

## 2017-12-28 DIAGNOSIS — I10 Essential (primary) hypertension: Secondary | ICD-10-CM | POA: Diagnosis not present

## 2017-12-28 DIAGNOSIS — R7303 Prediabetes: Secondary | ICD-10-CM

## 2017-12-28 DIAGNOSIS — E559 Vitamin D deficiency, unspecified: Secondary | ICD-10-CM

## 2017-12-28 DIAGNOSIS — M2550 Pain in unspecified joint: Secondary | ICD-10-CM | POA: Diagnosis not present

## 2017-12-28 DIAGNOSIS — R7309 Other abnormal glucose: Secondary | ICD-10-CM | POA: Diagnosis not present

## 2017-12-28 DIAGNOSIS — E782 Mixed hyperlipidemia: Secondary | ICD-10-CM | POA: Diagnosis not present

## 2017-12-28 DIAGNOSIS — M791 Myalgia, unspecified site: Secondary | ICD-10-CM

## 2017-12-28 DIAGNOSIS — Z8249 Family history of ischemic heart disease and other diseases of the circulatory system: Secondary | ICD-10-CM | POA: Insufficient documentation

## 2017-12-28 LAB — CBC WITH DIFFERENTIAL/PLATELET
BASOS PCT: 1.2 %
Basophils Absolute: 72 cells/uL (ref 0–200)
EOS PCT: 6.3 %
Eosinophils Absolute: 378 cells/uL (ref 15–500)
HEMATOCRIT: 39.5 % (ref 35.0–45.0)
Hemoglobin: 13.4 g/dL (ref 11.7–15.5)
LYMPHS ABS: 1092 {cells}/uL (ref 850–3900)
MCH: 30.2 pg (ref 27.0–33.0)
MCHC: 33.9 g/dL (ref 32.0–36.0)
MCV: 89 fL (ref 80.0–100.0)
MPV: 9.9 fL (ref 7.5–12.5)
Monocytes Relative: 8.3 %
NEUTROS ABS: 3960 {cells}/uL (ref 1500–7800)
Neutrophils Relative %: 66 %
Platelets: 278 10*3/uL (ref 140–400)
RBC: 4.44 10*6/uL (ref 3.80–5.10)
RDW: 12.8 % (ref 11.0–15.0)
Total Lymphocyte: 18.2 %
WBC: 6 10*3/uL (ref 3.8–10.8)
WBCMIX: 498 {cells}/uL (ref 200–950)

## 2017-12-28 LAB — TSH: TSH: 1.17 mIU/L (ref 0.40–4.50)

## 2017-12-28 LAB — SEDIMENTATION RATE: SED RATE: 14 mm/h (ref 0–30)

## 2017-12-28 NOTE — Patient Instructions (Signed)

## 2017-12-28 NOTE — Progress Notes (Signed)
This very nice 67 y.o. MWF presents for 3 month follow up with HTN, HLD, Pre-Diabetes and Vitamin D Deficiency.  Patient relates concern re: a sister now hospitalized x 7 weeks w/ Autoimmune Vasculitis & multi-systems failure, w/ strokes x 3 , off & on mech vent x 3 , Ecoli sepsis & has a ruptured Hepatic artery.       Patient underwent L breast Lumpectomy (12, 2016) followed by Chemoradiation (Dr Lindi Adie) and then had BSO (Sept 2017) for a BRCA2 mutation +ER/+PR/HER2 Neg.      Patient is treated for HTN & BP has been controlled at home. Today's BP: 120/74. Patient has had no complaints of any cardiac type chest pain, palpitations, dyspnea / orthopnea / PND, dizziness, claudication, or dependent edema.     Hyperlipidemia is not controlled with diet & Zetia (Intol Statins w/elev LFT's). Patient denies med SE's. Last Lipids were not at goal:  Lab Results  Component Value Date   CHOL 218 (H) 06/17/2017   HDL 82 06/17/2017   LDLCALC 119 (H) 06/17/2017   TRIG 73 06/17/2017   CHOLHDL 2.7 06/17/2017      Also, the patient has history of  PreDiabetes (A1c 6.2%/2016 and 5.7%/Oct,2016)) and has had no symptoms of reactive hypoglycemia, diabetic polys, paresthesias or visual blurring.  Last A1c was Normal & at goal: Lab Results  Component Value Date   HGBA1C 5.4 12/11/2016      Further, the patient also has history of Vitamin D Deficiency ("29"/2016) and she does not supplement vitamin D as repeatedly recommended. Last vitamin D was still very low: Lab Results  Component Value Date   VD25OH 26 (L) 06/17/2017   Current Outpatient Medications on File Prior to Visit  Medication Sig  . anastrozole (ARIMIDEX) 1 MG tablet Take 1 tablet (1 mg total) by mouth daily at 3 pm.  . benazepril-hydrochlorthiazide (LOTENSIN HCT) 20-12.5 MG tablet Take 1 tablet by mouth daily.  . Calcium Carb-Cholecalciferol (CALCIUM 1000 + D PO) Take 2 tablets by mouth daily. Gummies  . cholecalciferol (VITAMIN D) 1000  units tablet Take 1,000 Units by mouth daily.  Marland Kitchen ezetimibe (ZETIA) 10 MG tablet TAKE 1 TABLET DAILY FOR CHOLESTEROL  . naproxen sodium (ANAPROX) 220 MG tablet Take 220 mg by mouth daily as needed (pain).   No current facility-administered medications on file prior to visit.    Allergies  Allergen Reactions  . Lescol [Fluvastatin Sodium] Other (See Comments)    Myalgia  . Lipitor [Atorvastatin] Other (See Comments)    Increased LFT's  . Vytorin [Ezetimibe-Simvastatin] Other (See Comments)    myalgia   PMHx:   Past Medical History:  Diagnosis Date  . Allergic rhinitis, cause unspecified   . Anemia    history of anemia while teenager  . BRCA2 positive   . Breast cancer of upper-outer quadrant of left female breast ([place])    chemo complete 01/2016, radiation 04/2016  . DDD (degenerative disc disease)   . Family history of breast cancer 08/30/2015   Dx. In maternal grandmother in her 10s-40; dx. In maternal first cousin in her 55s-60s   . Family history of colon cancer 08/30/2015   Dx. In maternal uncle in his 30s   . Hyperlipidemia   . Hypertension   . Neuropathy   . Osteopenia 12/2011   Spine T -0.4, Femur -2.1  . PONV (postoperative nausea and vomiting)   . Splenic artery aneurysm ([place]) 2013   COILS DONE  . Vitamin  D deficiency    Immunization History  Administered Date(s) Administered  . DT 01/22/2015  . Influenza Split 06/08/2014, 06/02/2016  . Influenza, High Dose Seasonal PF 06/08/2014  . Influenza-Unspecified 06/14/2013, 06/04/2015, 05/27/2017  . Meningococcal Conjugate 09/02/2011  . PPD Test 01/18/2014  . Pneumococcal Conjugate-13 12/31/2011, 06/17/2017  . Pneumococcal-Unspecified 09/22/1995, 09/02/2011  . Td 11/27/1994, 04/29/2004, 01/22/2015   Past Surgical History:  Procedure Laterality Date  . BREAST LUMPECTOMY     left 2016  . BREAST LUMPECTOMY WITH RADIOACTIVE SEED AND SENTINEL LYMPH NODE BIOPSY Left 08/29/2015   Procedure: LEFT BREAST LUMPECTOMY WITH  RADIOACTIVE SEED WITH AXILLARY SENTINEL LYMPH NODE BIOPSY;  Surgeon: Excell Seltzer, MD;  Location: [facility];  Service: General;  Laterality: Left;  . [place]  . COLONOSCOPY  Jan. 7, 2014  . IR ANGIO EXTERNAL CAROTID SEL EXT CAROTID BILAT MOD SED  02/03/2017  . IR ANGIO EXTERNAL CAROTID SEL EXT CAROTID UNI R MOD SED  03/05/2017  . IR ANGIO INTRA EXTRACRAN SEL COM CAROTID INNOMINATE UNI R MOD SED  03/05/2017  . IR ANGIO INTRA EXTRACRAN SEL INTERNAL CAROTID BILAT MOD SED  02/03/2017  . IR ANGIO VERTEBRAL SEL VERTEBRAL BILAT MOD SED  02/03/2017  . IR ANGIOGRAM FOLLOW UP STUDY  03/05/2017  . IR NEURO EACH ADD'L AFTER BASIC UNI RIGHT (MS)  02/03/2017  . IR NEURO EACH ADD'L AFTER BASIC UNI RIGHT (MS)  03/05/2017  . IR TRANSCATH/EMBOLIZ  03/05/2017  . LAPAROSCOPIC BILATERAL SALPINGO OOPHERECTOMY Bilateral 05/16/2016   Procedure: LAPAROSCOPIC BILATERAL SALPINGO OOPHORECTOMY;  Surgeon: Paula Compton, MD;  Location: [facility];  Service: Gynecology;  Laterality: Bilateral;  . [place] SURGERY  2001, 2010  . PORTACATH PLACEMENT Bilateral 10/09/2015   Procedure: INSERTION PORT-A-CATH;  Surgeon: Excell Seltzer, MD;  Location: [facility];  Service: General;  Laterality: Bilateral;  . Portacath removal    . RADIOLOGY WITH ANESTHESIA N/A 03/05/2017   Procedure: ARTERIOGRAM ONYX EMBOLIZATION OF FISTULA;  Surgeon: Consuella Lose, MD;  Location: [facility];  Service: Radiology;  Laterality: N/A;  . RE-EXCISION OF BREAST LUMPECTOMY Left 09/07/2015   Procedure: RE-EXCISION OF LEFT BREAST LUMPECTOMY;  Surgeon: Excell Seltzer, MD;  Location: [facility];  Service: General;  Laterality: Left;  . SPINE SURGERY  1998   cervical diskectomy  . SPINE SURGERY  2001, 2010   Lumbar diskectomy  . splenic aneurysm  02/10/2012   Aneurysm of splenic artery  . TONSILLECTOMY    . VISCERAL ANGIOGRAM N/A 02/10/2012   Procedure: VISCERAL ANGIOGRAM;  Surgeon: Serafina Mitchell, MD;  Location: [facility];  Service: Cardiovascular;  Laterality: N/A;   FHx:    Reviewed / unchanged  SHx:    Reviewed / unchanged   Systems Review:  Constitutional: Denies fever, chills, wt changes, headaches, insomnia, fatigue, night sweats, change in appetite. Eyes: Denies redness, blurred vision, diplopia, discharge, itchy, watery eyes.  ENT: Denies discharge, congestion, post nasal drip, epistaxis, sore throat, earache, hearing loss, dental pain, tinnitus, vertigo, sinus pain, snoring.  CV: Denies chest pain, palpitations, irregular heartbeat, syncope, dyspnea, diaphoresis, orthopnea, PND, claudication or edema. Respiratory: denies cough, dyspnea, DOE, pleurisy, hoarseness, laryngitis, wheezing.  Gastrointestinal: Denies dysphagia, odynophagia, heartburn, reflux, water brash, abdominal pain or cramps, nausea, vomiting, bloating, diarrhea, constipation, hematemesis, melena, hematochezia  or hemorrhoids. Genitourinary: Denies dysuria, frequency, urgency, nocturia, hesitancy, discharge, hematuria or flank pain. Musculoskeletal: Denies  jt. swelling, pain, limping or strain/sprain.  c/o arthralgias of fingers, hands, wrists and occasional  myalgias and morning stiffness. Skin: Denies pruritus, rash, hives, warts, acne, eczema or change in skin lesion(s). Neuro: No weakness, tremor, incoordination, spasms, paresthesia or pain. Psychiatric: Denies confusion, memory loss or sensory loss. Endo: Denies change in weight, skin or hair change.  Heme/Lymph: No excessive bleeding, bruising or enlarged lymph nodes.  Physical Exam  BP 120/74   Pulse 72   Temp 97.9 F (36.6 C)   Ht 5' 5"  (1.651 m)   Wt 175 lb (79.4 kg)   BMI 29.12 kg/m   Appears  well nourished, well groomed  and in no distress.  Eyes: PERRLA, EOMs, conjunctiva no swelling or erythema. Sinuses: No frontal/maxillary tenderness ENT/Mouth: EAC's clear, TM's nl w/o erythema, bulging. Nares clear w/o erythema, swelling, exudates. Oropharynx clear  without erythema or exudates. Oral hygiene is good. Tongue normal, non obstructing. Hearing intact.  Neck: Supple. Thyroid not palpable. Car 2+/2+ without bruits, nodes or JVD. Chest: Respirations nl with BS clear & equal w/o rales, rhonchi, wheezing or stridor.  Cor: Heart sounds normal w/ regular rate and rhythm without sig. murmurs, gallops, clicks or rubs. Peripheral pulses normal and equal  without edema.  Abdomen: Soft & bowel sounds normal. Non-tender w/o guarding, rebound, hernias, masses or organomegaly.  Lymphatics: Unremarkable.  Musculoskeletal: Full ROM all peripheral extremities, joint stability, 5/5 strength and normal gait.  Skin: Warm, dry without exposed rashes, lesions or ecchymosis apparent.  Neuro: Cranial nerves intact, reflexes equal bilaterally. Sensory-motor testing grossly intact. Tendon reflexes grossly intact.  Pysch: Alert & oriented x 3.  Insight and judgement nl & appropriate. No ideations.  Assessment and Plan:  1. Essential hypertension  - Continue medication, monitor blood pressure at home.  - Continue DASH diet.  Reminder to go to the ER if any CP,  SOB, nausea, dizziness, severe HA, changes vision/speech.  - CBC with Differential/Platelet - COMPLETE METABOLIC PANEL WITH GFR - Magnesium - TSH  2. Hyperlipidemia, mixed  - Continue diet/meds, exercise,& lifestyle modifications.  - Continue monitor periodic cholesterol/liver & renal functions   - Lipid panel - TSH  3. Abnormal glucose  - Hemoglobin A1c - Insulin, random  4. Vitamin D deficiency  - Continue diet, exercise, lifestyle modifications.  - Monitor appropriate labs. - Continue supplementation.   - VITAMIN D 25 Hydroxyl  5. Prediabetes  - Hemoglobin A1c - Insulin, random  6. Medication management  - CBC with Differential/Platelet - COMPLETE METABOLIC PANEL WITH GFR - Magnesium - Lipid panel - TSH - Hemoglobin A1c - Insulin, random - VITAMIN D 25 Hydroxyl  7.  Arthralgias  - Cyclic citrul peptide antibody, IgG - Anti-Smith antibody - Sedimentation rate  8. Myalgias  - Cyclic citrul peptide antibody, IgG - Anti-Smith antibody - Sedimentation rate          Discussed  regular exercise, BP monitoring, weight control to achieve/maintain BMI less than 25 and discussed med and SE's. Recommended labs to assess and monitor clinical status with further disposition pending results of labs. Over 30 minutes of exam, counseling, chart review was performed.

## 2017-12-29 LAB — COMPLETE METABOLIC PANEL WITH GFR
AG RATIO: 1.5 (calc) (ref 1.0–2.5)
ALT: 26 U/L (ref 6–29)
AST: 26 U/L (ref 10–35)
Albumin: 4.4 g/dL (ref 3.6–5.1)
Alkaline phosphatase (APISO): 71 U/L (ref 33–130)
BUN: 14 mg/dL (ref 7–25)
CALCIUM: 9.9 mg/dL (ref 8.6–10.4)
CO2: 28 mmol/L (ref 20–32)
Chloride: 98 mmol/L (ref 98–110)
Creat: 0.69 mg/dL (ref 0.50–0.99)
GFR, EST AFRICAN AMERICAN: 104 mL/min/{1.73_m2} (ref >=60)
GFR, EST NON AFRICAN AMERICAN: 89 mL/min/{1.73_m2} (ref >=60)
GLOBULIN: 2.9 g/dL (ref 1.9–3.7)
Glucose, Bld: 86 mg/dL (ref 65–99)
POTASSIUM: 4.2 mmol/L (ref 3.5–5.3)
SODIUM: 134 mmol/L — AB (ref 135–146)
Total Bilirubin: 0.5 mg/dL (ref 0.2–1.2)
Total Protein: 7.3 g/dL (ref 6.1–8.1)

## 2017-12-29 LAB — VITAMIN D 25 HYDROXY (VIT D DEFICIENCY, FRACTURES): Vit D, 25-Hydroxy: 22 ng/mL — ABNORMAL LOW (ref 30–100)

## 2017-12-29 LAB — HEMOGLOBIN A1C
EAG (MMOL/L): 6.6 (calc)
HEMOGLOBIN A1C: 5.8 %{Hb} — AB (ref <5.7)
MEAN PLASMA GLUCOSE: 120 (calc)

## 2017-12-29 LAB — INSULIN, RANDOM: Insulin: 19.6 u[IU]/mL (ref 2.0–19.6)

## 2017-12-29 LAB — LIPID PANEL
CHOL/HDL RATIO: 3.3 (calc) (ref <5.0)
Cholesterol: 213 mg/dL — ABNORMAL HIGH (ref <200)
HDL: 64 mg/dL (ref >50)
LDL Cholesterol (Calc): 124 mg/dL (calc) — ABNORMAL HIGH
NON-HDL CHOLESTEROL (CALC): 149 mg/dL — AB (ref <130)
Triglycerides: 139 mg/dL (ref <150)

## 2017-12-29 LAB — CYCLIC CITRUL PEPTIDE ANTIBODY, IGG: Cyclic Citrullin Peptide Ab: <16 UNITS

## 2017-12-29 LAB — MAGNESIUM: Magnesium: 1.8 mg/dL (ref 1.5–2.5)

## 2017-12-29 LAB — ANTI-SMITH ANTIBODY: ENA SM Ab Ser-aCnc: <1 AI

## 2018-01-08 ENCOUNTER — Telehealth: Payer: Self-pay | Admitting: *Deleted

## 2018-01-08 ENCOUNTER — Other Ambulatory Visit: Payer: Self-pay | Admitting: *Deleted

## 2018-01-08 ENCOUNTER — Telehealth: Payer: Self-pay

## 2018-01-08 DIAGNOSIS — Z1231 Encounter for screening mammogram for malignant neoplasm of breast: Secondary | ICD-10-CM

## 2018-01-08 NOTE — Telephone Encounter (Signed)
This RN retrieved VM from pt stating she needs a new order for MRI of breast to be sent to [facility] so she may have an open MRI.  New order entered , [facility] contacted regarding order - they will call the patient to schedule.

## 2018-01-08 NOTE — Telephone Encounter (Signed)
Patient called and left a message concerning her MRI for [facility] to send a referral to GI. She have canceled her appointment with [facility]. Left a voice message for [facility] RN. Per 5/10 phone message return

## 2018-01-16 ENCOUNTER — Other Ambulatory Visit: Payer: Self-pay | Admitting: Adult Health

## 2018-01-25 ENCOUNTER — Other Ambulatory Visit: Payer: Self-pay | Admitting: Physician Assistant

## 2018-01-25 DIAGNOSIS — E782 Mixed hyperlipidemia: Secondary | ICD-10-CM

## 2018-01-28 DIAGNOSIS — H0100B Unspecified blepharitis left eye, upper and lower eyelids: Secondary | ICD-10-CM | POA: Diagnosis not present

## 2018-01-28 DIAGNOSIS — H5203 Hypermetropia, bilateral: Secondary | ICD-10-CM | POA: Diagnosis not present

## 2018-01-28 DIAGNOSIS — H0100A Unspecified blepharitis right eye, upper and lower eyelids: Secondary | ICD-10-CM | POA: Diagnosis not present

## 2018-01-28 DIAGNOSIS — H25813 Combined forms of age-related cataract, bilateral: Secondary | ICD-10-CM | POA: Diagnosis not present

## 2018-01-29 ENCOUNTER — Ambulatory Visit
Admission: RE | Admit: 2018-01-29 | Discharge: 2018-01-29 | Disposition: A | Discharge status: Home / Self Care | Payer: Medicare Other | Source: Ambulatory Visit | Attending: Hematology and Oncology | Admitting: Hematology and Oncology

## 2018-01-29 DIAGNOSIS — Z853 Personal history of malignant neoplasm of breast: Secondary | ICD-10-CM | POA: Diagnosis not present

## 2018-01-29 DIAGNOSIS — Z510 Encounter for antineoplastic radiation therapy: Secondary | ICD-10-CM | POA: Diagnosis not present

## 2018-01-29 DIAGNOSIS — Z1231 Encounter for screening mammogram for malignant neoplasm of breast: Secondary | ICD-10-CM

## 2018-01-29 MED ORDER — GADOBENATE DIMEGLUMINE 529 MG/ML IV SOLN
15.0000 mL | Freq: Once | INTRAVENOUS | Status: AC | PRN
  Administered 2018-01-29: 15 mL via INTRAVENOUS

## 2018-02-01 ENCOUNTER — Telehealth: Payer: Self-pay | Admitting: Hematology and Oncology

## 2018-02-01 NOTE — Telephone Encounter (Signed)
I called to leave a message for the patient to report that the breast MRI was normal but the phone rang and it kept on ringing.

## 2018-02-02 ENCOUNTER — Telehealth: Payer: Self-pay | Admitting: Hematology and Oncology

## 2018-02-02 NOTE — Telephone Encounter (Signed)
Called pt. Spoke w/ family member.  Pt not available.  Resch appt from 7/23 to 7/30 @ 3:30 pm.  Mailed calendar.

## 2018-02-04 ENCOUNTER — Ambulatory Visit (HOSPITAL_COMMUNITY): Payer: Medicare Other

## 2018-03-01 HISTORY — PX: OTHER SURGICAL HISTORY: SHX169

## 2018-03-23 ENCOUNTER — Ambulatory Visit: Payer: Medicare Other | Admitting: Hematology and Oncology

## 2018-03-30 ENCOUNTER — Inpatient Hospital Stay: Payer: Medicare Other | Attending: Hematology and Oncology | Admitting: Hematology and Oncology

## 2018-03-30 ENCOUNTER — Telehealth: Payer: Self-pay | Admitting: Hematology and Oncology

## 2018-03-30 DIAGNOSIS — C50412 Malignant neoplasm of upper-outer quadrant of left female breast: Secondary | ICD-10-CM | POA: Diagnosis not present

## 2018-03-30 DIAGNOSIS — Z170 Estrogen receptor positive status [ER+]: Secondary | ICD-10-CM | POA: Diagnosis not present

## 2018-03-30 DIAGNOSIS — R232 Flushing: Secondary | ICD-10-CM | POA: Insufficient documentation

## 2018-03-30 DIAGNOSIS — Z79899 Other long term (current) drug therapy: Secondary | ICD-10-CM | POA: Diagnosis not present

## 2018-03-30 DIAGNOSIS — Z79811 Long term (current) use of aromatase inhibitors: Secondary | ICD-10-CM | POA: Diagnosis not present

## 2018-03-30 DIAGNOSIS — M2560 Stiffness of unspecified joint, not elsewhere classified: Secondary | ICD-10-CM | POA: Diagnosis not present

## 2018-03-30 DIAGNOSIS — Z9221 Personal history of antineoplastic chemotherapy: Secondary | ICD-10-CM | POA: Diagnosis not present

## 2018-03-30 DIAGNOSIS — Z923 Personal history of irradiation: Secondary | ICD-10-CM | POA: Insufficient documentation

## 2018-03-30 DIAGNOSIS — Z1231 Encounter for screening mammogram for malignant neoplasm of breast: Secondary | ICD-10-CM

## 2018-03-30 MED ORDER — ANASTROZOLE 1 MG PO TABS: 1.0000 mg | ORAL_TABLET | Freq: Every day | ORAL | 3 refills | Status: DC

## 2018-03-30 NOTE — Assessment & Plan Note (Signed)
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 Patient does not need axillary lymph node dissection based on current NCCN guidelines and ACOSOG Z 11 clinical trial; luminal type B and high-risk phenotype. Risk of relapse without chemotherapy 24% versus 12% with chemotherapy.  Treatment summary: 1. Dose dense Adriamycin and Cytoxan 4 followed by Abraxane weekly 12 started 10/11/2015 completed 02/21/2016 2. Adjuvant radiation therapy started 03/27/2016 completed 04/23/2016 3. Adjuvant antiestrogen therapy started 05/22/2016 ----------------------------------------------------------------------------------------------------------------- Current treatment: Antiestrogen therapy with anastrozole 1 mg dailyto start 06/01/2016 BRCA2 mutation: Oophorectomy 05/16/2016: No malignancy  Anastrozole Toxicities:  Able to tolerate anastrozole fairly well. Denies any hot flashes or myalgias. The initial hot flashes have improved.  Surveillance of breast cancer: 1. Mammograms 09/04/2017: Benign, breast density category B 2. MRI breast 02/10/2018. (Because of BRCA2 mutation): No evidence of recurrent breast cancer  Return to clinic 1 year for follow-up

## 2018-03-30 NOTE — Telephone Encounter (Signed)
Gave patient avs and calendar.  Patient aware of orders at GI for MRI.

## 2018-03-30 NOTE — Progress Notes (Signed)
Patient Care Team: Unk Pinto, MD as PCP - General (Internal Medicine) Ralene Bathe, MD as Consulting Physician (Ophthalmology) Serafina Mitchell, MD as Consulting Physician (Vascular Surgery) Carol Ada, MD as Consulting Physician (Gastroenterology) Martinique, Amy, MD as Consulting Physician (Dermatology) Nicholas Lose, MD as Consulting Physician (Hematology and Oncology) Excell Seltzer, MD as Consulting Physician (General Surgery)  DIAGNOSIS:  Encounter Diagnosis  Name Primary?  . Malignant neoplasm of upper-outer quadrant of left breast in female, estrogen receptor positive ([place])     SUMMARY OF ONCOLOGIC HISTORY:   Breast cancer of upper-outer quadrant of left female breast ([place])   08/03/2015 Mammogram    Left Breast: 6 mm mass @ 1 o clock      08/09/2015 Initial Diagnosis    Left breast 1:00 position: Invasive ductal carcinoma grade 1-2, ER 100%, PR 10%, HER-2 negative ratio 1.79, Ki-67 15%, T1 BN 0 stage I a clinical stage      08/29/2015 Surgery    Left lumpectomy ([facility]): invasive ductal carcinoma 1.2 cm with LVID, with DCIS intermediate grade, 1 sentinel node positive, ER 100%, PR 10%, HER-2 negative ratio 1.79, Ki-67 15% , margins negative, Mammaprint high risk, luminal B, T1cN1 stage II a      08/29/2015 Procedure    Mammaprint: High-risk, Luminal type      09/07/2015 Surgery    Left breast re-excision ([facility]): ADH, no malignancy, negative margins.       09/12/2015 Procedure    BRCA2 mutation "[id number]." Also VUS on CHEK2.  Otherwise negative. Genes analyzed:  ATM, BARD1, BRCA1, BRCA2, BRIP1, CDH1, CHEK2, FANCC, MLH1, MSH2, MSH6, NBN, PALB2, PMS2, PTEN, RAD51C, RAD51D, TP53, and XRCC2; deletion/duplication analysis (without sequencing) for EPCAM.      10/11/2015 - 02/21/2016 Chemotherapy    Adjuvant chemotherapy with dose dense Adriamycin and Cytoxan 4 followed by Abraxane weekly 12      03/27/2016 - 04/23/2016 Radiation Therapy   Adjuvant radiation therapy Lisbeth Renshaw). Left breast: 42.5 Gy in 17 fractions. Left breast boost: 7.5 Gy in 3 fractions.       05/16/2016 Surgery    Bilateral salpingo-oophorectomies: No cancer      06/05/2016 -  Anti-estrogen oral therapy    Anastrozole 1 mg by mouth daily       CHIEF COMPLIANT: Follow-up on anastrozole therapy  INTERVAL HISTORY: Jacqueline Jenkins is a 67 year old with above-mentioned history of BRCA2 mutation positive left breast cancer who is currently on antiestrogen therapy with anastrozole and appears to be tolerating it reasonably well.  She does have hot flashes as well as joint stiffness but she appears to be tolerating both of them reasonably well.  Because of the BRCA2 mutation she underwent breast MRIs which were normal.  She denies any lumps or nodules in the breast.  REVIEW OF SYSTEMS:   Constitutional: Denies fevers, chills or abnormal weight loss Eyes: Denies blurriness of vision Ears, nose, mouth, throat, and face: Denies mucositis or sore throat Respiratory: Denies cough, dyspnea or wheezes Cardiovascular: Denies palpitation, chest discomfort Gastrointestinal:  Denies nausea, heartburn or change in bowel habits Skin: Denies abnormal skin rashes Lymphatics: Denies new lymphadenopathy or easy bruising Neurological:Denies numbness, tingling or new weaknesses Behavioral/Psych: Mood is stable, no new changes  Extremities: No lower extremity edema Breast:  denies any pain or lumps or nodules in either breasts All other systems were reviewed with the patient and are negative.  I have reviewed the past medical history, past surgical history, social history and family history with the patient and  they are unchanged from previous note.  ALLERGIES:  is allergic to lescol [fluvastatin sodium]; lipitor [atorvastatin]; and vytorin [ezetimibe-simvastatin].  MEDICATIONS:  Current Outpatient Medications  Medication Sig Dispense Refill  . anastrozole (ARIMIDEX) 1 MG  tablet Take 1 tablet (1 mg total) by mouth daily at 3 pm. 90 tablet 3  . benazepril-hydrochlorthiazide (LOTENSIN HCT) 20-12.5 MG tablet TAKE 1 TABLET BY MOUTH EVERY DAY 90 tablet 1  . Calcium Carb-Cholecalciferol (CALCIUM 1000 + D PO) Take 2 tablets by mouth daily. Gummies    . cholecalciferol (VITAMIN D) 1000 units tablet Take 1,000 Units by mouth daily.    Marland Kitchen ezetimibe (ZETIA) 10 MG tablet TAKE 1 TABLET DAILY FOR CHOLESTEROL 90 tablet 1  . naproxen sodium (ANAPROX) 220 MG tablet Take 220 mg by mouth daily as needed (pain).     No current facility-administered medications for this visit.     PHYSICAL EXAMINATION: ECOG PERFORMANCE STATUS: 0 - Asymptomatic  Vitals:   03/30/18 1526  BP: (!) 149/79  Pulse: 83  Resp: 17  Temp: 98.8 F (37.1 C)  SpO2: 97%   Filed Weights   03/30/18 1526  Weight: 178 lb 8 oz (81 kg)    GENERAL:alert, no distress and comfortable SKIN: skin color, texture, turgor are normal, no rashes or significant lesions EYES: normal, Conjunctiva are pink and non-injected, sclera clear OROPHARYNX:no exudate, no erythema and lips, buccal mucosa, and tongue normal  NECK: supple, thyroid normal size, non-tender, without nodularity LYMPH:  no palpable lymphadenopathy in the cervical, axillary or inguinal LUNGS: clear to auscultation and percussion with normal breathing effort HEART: regular rate & rhythm and no murmurs and no lower extremity edema ABDOMEN:abdomen soft, non-tender and normal bowel sounds MUSCULOSKELETAL:no cyanosis of digits and no clubbing  NEURO: alert & oriented x 3 with fluent speech, no focal motor/sensory deficits EXTREMITIES: No lower extremity edema BREAST: 20 no palpable masses or nodules in either right or left breasts. No palpable axillary supraclavicular or infraclavicular adenopathy no breast tenderness or nipple discharge. (exam performed in the presence of a chaperone)  LABORATORY DATA:  I have reviewed the data as listed CMP Latest Ref  Rng & Units 12/28/2017 06/17/2017 03/05/2017  Glucose 65 - 99 mg/dL 86 89 -  BUN 7 - 25 mg/dL 14 15 -  Creatinine 0.50 - 0.99 mg/dL 0.69 0.79 0.77  Sodium 135 - 146 mmol/L 134(L) 133(L) -  Potassium 3.5 - 5.3 mmol/L 4.2 4.2 -  Chloride 98 - 110 mmol/L 98 95(L) -  CO2 20 - 32 mmol/L 28 21 -  Calcium 8.6 - 10.4 mg/dL 9.9 10.1 -  Total Protein 6.1 - 8.1 g/dL 7.3 7.3 -  Total Bilirubin 0.2 - 1.2 mg/dL 0.5 0.6 -  Alkaline Phos 33 - 130 U/L - - -  AST 10 - 35 U/L 26 24 -  ALT 6 - 29 U/L 26 22 -    Lab Results  Component Value Date   WBC 6.0 12/28/2017   HGB 13.4 12/28/2017   HCT 39.5 12/28/2017   MCV 89.0 12/28/2017   PLT 278 12/28/2017   NEUTROABS 3,960 12/28/2017    ASSESSMENT & PLAN:  Breast cancer of upper-outer quadrant of left female breast ([place]) Left lumpectomy 08/29/2015: Invasive ductal carcinoma 1.2 cm with LVID, with DCIS intermediate grade, 1 sentinel node positive, ER 100%, PR 10%, HER-2 negative ratio 1.79, Ki-67 15% , margins negative, Mammaprint high risk, luminal B, T1cN1 stage II a. Patient does not need axillary lymph node dissection based on  current NCCN guidelines and ACOSOG Z 11 clinical trial; luminal type B and high-risk phenotype. Risk of relapse without chemotherapy 24% versus 12% with chemotherapy.  Treatment summary: 1. Dose dense Adriamycin and Cytoxan 4 followed by Abraxane weekly 12 started 10/11/2015 completed 02/21/2016 2. Adjuvant radiation therapy started 03/27/2016 completed 04/23/2016 3. Adjuvant antiestrogen therapy started 05/22/2016 ----------------------------------------------------------------------------------------------------------------- Current treatment: Antiestrogen therapy with anastrozole 1 mg dailyto start 06/01/2016 BRCA2 mutation: Oophorectomy 05/16/2016: No malignancy  Anastrozole Toxicities:  Able to tolerate anastrozole fairly well. Denies any hot flashes or myalgias. The initial hot flashes have improved.  Surveillance  of breast cancer: 1. Mammograms 09/04/2017: Benign, breast density category B 2. MRI breast 02/10/2018. (Because of BRCA2 mutation): No evidence of recurrent breast cancer  Return to clinic 1 year for follow-up    No orders of the defined types were placed in this encounter.  The patient has a good understanding of the overall plan. she agrees with it. she will call with any problems that may develop before the next visit here.   Harriette Ohara, MD 03/30/18

## 2018-04-06 ENCOUNTER — Ambulatory Visit (INDEPENDENT_AMBULATORY_CARE_PROVIDER_SITE_OTHER): Payer: Medicare Other | Admitting: Adult Health

## 2018-04-06 ENCOUNTER — Ambulatory Visit (HOSPITAL_COMMUNITY)
Admission: RE | Admit: 2018-04-06 | Discharge: 2018-04-06 | Disposition: A | Discharge status: Home / Self Care | Payer: Medicare Other | Source: Ambulatory Visit | Attending: Adult Health | Admitting: Adult Health

## 2018-04-06 ENCOUNTER — Encounter: Payer: Self-pay | Admitting: Adult Health

## 2018-04-06 VITALS — BP 132/80 | HR 88 | Temp 97.7°F | Resp 16 | Ht 65.0 in | Wt 177.0 lb

## 2018-04-06 DIAGNOSIS — R1032 Left lower quadrant pain: Secondary | ICD-10-CM

## 2018-04-06 DIAGNOSIS — D259 Leiomyoma of uterus, unspecified: Secondary | ICD-10-CM | POA: Diagnosis not present

## 2018-04-06 DIAGNOSIS — K5732 Diverticulitis of large intestine without perforation or abscess without bleeding: Secondary | ICD-10-CM | POA: Diagnosis not present

## 2018-04-06 DIAGNOSIS — K5792 Diverticulitis of intestine, part unspecified, without perforation or abscess without bleeding: Secondary | ICD-10-CM | POA: Insufficient documentation

## 2018-04-06 DIAGNOSIS — K5790 Diverticulosis of intestine, part unspecified, without perforation or abscess without bleeding: Secondary | ICD-10-CM | POA: Diagnosis not present

## 2018-04-06 DIAGNOSIS — R10814 Left lower quadrant abdominal tenderness: Secondary | ICD-10-CM | POA: Diagnosis not present

## 2018-04-06 DIAGNOSIS — N2889 Other specified disorders of kidney and ureter: Secondary | ICD-10-CM

## 2018-04-06 DIAGNOSIS — K8020 Calculus of gallbladder without cholecystitis without obstruction: Secondary | ICD-10-CM | POA: Diagnosis not present

## 2018-04-06 DIAGNOSIS — K760 Fatty (change of) liver, not elsewhere classified: Secondary | ICD-10-CM | POA: Diagnosis not present

## 2018-04-06 DIAGNOSIS — I728 Aneurysm of other specified arteries: Secondary | ICD-10-CM | POA: Diagnosis not present

## 2018-04-06 HISTORY — DX: Other specified disorders of kidney and ureter: N28.89

## 2018-04-06 MED ORDER — METRONIDAZOLE 500 MG PO TABS: 500.0000 mg | ORAL_TABLET | Freq: Three times a day (TID) | ORAL | 0 refills | Status: DC

## 2018-04-06 MED ORDER — CIPROFLOXACIN HCL 500 MG PO TABS: 500.0000 mg | ORAL_TABLET | Freq: Two times a day (BID) | ORAL | 0 refills | Status: AC

## 2018-04-06 NOTE — Progress Notes (Signed)
Assessment and Plan:  Jacqueline Jenkins was seen today for abdominal pain.  Diagnoses and all orders for this visit:  Left lower quadrant abdominal tenderness without rebound tenderness Obtaining STAT labs and imaging today for diverticulitis vs abscess, rule out renal calculi/pancreatic/splenic origin due to LUQ tenderness radiating to flank Will proceed with antibiotics after imaging obtained, recommended NPO until resulted, clear liquids if not resulted by this evening Has nausea medication at home Strong ER precautions given -     CBC with Differential/Platelet -     COMPLETE METABOLIC PANEL WITH GFR -     Urinalysis w microscopic + reflex cultur -     Amylase -     Lipase -     CT Abdomen Pelvis Wo Contrast; Future  Other orders -     ciprofloxacin (CIPRO) 500 MG tablet; Take 1 tablet (500 mg total) by mouth 2 (two) times daily for 7 days. -     metroNIDAZOLE (FLAGYL) 500 MG tablet; Take 1 tablet (500 mg total) by mouth 3 (three) times daily.  Further disposition pending results of labs. Discussed med's effects and SE's.   Over 30 minutes of exam, counseling, chart review, and critical decision making was performed.   Future Appointments  Date Time Provider [place]  07/01/2018  3:00 PM Vicie Mutters, PA-C [facility] None  10/19/2018  2:00 PM Vicie Mutters, PA-C [facility] None  03/31/2019  2:00 PM Nicholas Lose, MD [facility] None  05/23/2019  8:00 AM [facility] - [facility]  05/23/2019  9:00 AM Serafina Mitchell, MD [facility]    ------------------------------------------------------------------------------------------------------------------   HPI BP 132/80   Pulse 88   Temp 97.7 F (36.5 C)   Resp 16   Ht 5' 5"  (1.651 m)   Wt 177 lb (80.3 kg)   BMI 29.45 kg/m   66 y.o.female presents for evaluation of LLQ abdominal pain x 3 days, sharp, stabbing, comes in waves, 9/10 at worse (last night), eases off to 4/10. She reports pain worse with movement,  improving, sensation of incomplete bowel emptying, sensation of bloating, having some nausea without emesis. Denies any changes in stool, frequency, character is at baseline. She reports sensation of fever/chills last night.   She does have known diverticulosis by imaging, no hx of diverticulitis in the past.   She has breast cancer and is s/p prophylactic bilateral salpingooopherectomy in 2017, has known splenic aneurysm noted to be decreased in size from 2013 at most recent imaging on 09/18/2015. She has know gallstones and fatty atrophy of pancreas per CT from 2017 as well.   Past Medical History:  Diagnosis Date  . Allergic rhinitis, cause unspecified   . Anemia    history of anemia while teenager  . BRCA2 positive   . Breast cancer of upper-outer quadrant of left female breast ([place])    chemo complete 01/2016, radiation 04/2016  . DDD (degenerative disc disease)   . Family history of breast cancer 08/30/2015   Dx. In maternal grandmother in her 53s-40; dx. In maternal first cousin in her 2s-60s   . Family history of colon cancer 08/30/2015   Dx. In maternal uncle in his 73s   . Hyperlipidemia   . Hypertension   . Neuropathy   . Osteopenia 12/2011   Spine T -0.4, Femur -2.1  . PONV (postoperative nausea and vomiting)   . Splenic artery aneurysm ([place]) 2013   COILS DONE  . Vitamin D deficiency      Allergies  Allergen Reactions  . Lescol [Fluvastatin Sodium] Other (See Comments)    Myalgia  . Lipitor [Atorvastatin] Other (See Comments)    Increased LFT's  . Vytorin [Ezetimibe-Simvastatin] Other (See Comments)    myalgia    Current Outpatient Medications on File Prior to Visit  Medication Sig  . anastrozole (ARIMIDEX) 1 MG tablet Take 1 tablet (1 mg total) by mouth daily at 3 pm.  . benazepril-hydrochlorthiazide (LOTENSIN HCT) 20-12.5 MG tablet TAKE 1 TABLET BY MOUTH EVERY DAY  . Calcium Carb-Cholecalciferol (CALCIUM 1000 + D PO) Take 2 tablets by mouth daily. Gummies  .  cholecalciferol (VITAMIN D) 1000 units tablet Take 1,000 Units by mouth daily.  Marland Kitchen ezetimibe (ZETIA) 10 MG tablet TAKE 1 TABLET DAILY FOR CHOLESTEROL  . naproxen sodium (ANAPROX) 220 MG tablet Take 220 mg by mouth daily as needed (pain).   No current facility-administered medications on file prior to visit.     ROS: Review of Systems  Constitutional: Positive for chills and fever. Negative for diaphoresis, malaise/fatigue and weight loss.  HENT: Negative.   Eyes: Negative.   Respiratory: Negative for cough, sputum production, shortness of breath and wheezing.   Cardiovascular: Negative for chest pain, palpitations, orthopnea, leg swelling and PND.  Gastrointestinal: Positive for abdominal pain and nausea. Negative for blood in stool, constipation, diarrhea, heartburn, melena and vomiting.  Genitourinary: Negative for dysuria, flank pain, frequency, hematuria and urgency.  Musculoskeletal: Negative for back pain and myalgias.  Skin: Negative for rash.  Neurological: Negative for dizziness and headaches.     Physical Exam:  BP 132/80   Pulse 88   Temp 97.7 F (36.5 C)   Resp 16   Ht 5' 5"  (1.651 m)   Wt 177 lb (80.3 kg)   BMI 29.45 kg/m   General Appearance: Well nourished, in no acute distress. Eyes: PERRLA, conjunctiva no swelling or erythema ENT/Mouth: Ext aud canals clear, TMs without erythema, bulging. No erythema, swelling, or exudate on post pharynx.  Tonsils not swollen or erythematous. Hearing normal.  Neck: Supple.  Respiratory: Respiratory effort normal, BS equal bilaterally without rales, rhonchi, wheezing or stridor.  Cardio: RRR with no MRGs. Brisk peripheral pulses without edema.  Abdomen: Soft, + BS.  LLQ very tender to light palpation, deep LUQ palpation causes radiating pain to flank, no rebound, notable hernia or palpable distinct mass, exam limited due to exquisite tenderness Lymphatics: Non tender without lymphadenopathy.  Musculoskeletal: normal gait, no  CVA tenderness  Skin: Warm, dry without rashes, lesions, ecchymosis.  Neuro: Cranial nerves intact. Normal muscle tone, no cerebellar symptoms.  Psych: Awake and oriented X 3, normal affect, Insight and Judgment appropriate.     Izora Ribas, NP 12:11 PM [facility]

## 2018-04-06 NOTE — Patient Instructions (Addendum)
Please do not eat or drink ~8 hours prior to CT scan  Recommend clear liquids until we know what's doing on  Start cipro/flagyl  Use nausea medicine as needed  Go to ER if any worse   Diverticulitis Diverticulitis is infection or inflammation of small pouches (diverticula) in the colon that form due to a condition called diverticulosis. Diverticula can trap stool (feces) and bacteria, causing infection and inflammation. Diverticulitis may cause severe stomach pain and diarrhea. It may lead to tissue damage in the colon that causes bleeding. The diverticula may also burst (rupture) and cause infected stool to enter other areas of the abdomen. Complications of diverticulitis can include:  Bleeding.  Severe infection.  Severe pain.  Rupture (perforation) of the colon.  Blockage (obstruction) of the colon.  What are the causes? This condition is caused by stool becoming trapped in the diverticula, which allows bacteria to grow in the diverticula. This leads to inflammation and infection. What increases the risk? You are more likely to develop this condition if:  You have diverticulosis. The risk for diverticulosis increases if: ? You are overweight or obese. ? You use tobacco products. ? You do not get enough exercise.  You eat a diet that does not include enough fiber. High-fiber foods include fruits, vegetables, beans, nuts, and whole grains.  What are the signs or symptoms? Symptoms of this condition may include:  Pain and tenderness in the abdomen. The pain is normally located on the left side of the abdomen, but it may occur in other areas.  Fever and chills.  Bloating.  Cramping.  Nausea.  Vomiting.  Changes in bowel routines.  Blood in your stool.  How is this diagnosed? This condition is diagnosed based on:  Your medical history.  A physical exam.  Tests to make sure there is nothing else causing your condition. These tests may include: ? Blood  tests. ? Urine tests. ? Imaging tests of the abdomen, including X-rays, ultrasounds, MRIs, or CT scans.  How is this treated? Most cases of this condition are mild and can be treated at home. Treatment may include:  Taking over-the-counter pain medicines.  Following a clear liquid diet.  Taking antibiotic medicines by mouth.  Rest.  More severe cases may need to be treated at a hospital. Treatment may include:  Not eating or drinking.  Taking prescription pain medicine.  Receiving antibiotic medicines through an IV tube.  Receiving fluids and nutrition through an IV tube.  Surgery.  When your condition is under control, your health care provider may recommend that you have a colonoscopy. This is an exam to look at the entire large intestine. During the exam, a lubricated, bendable tube is inserted into the anus and then passed into the rectum, colon, and other parts of the large intestine. A colonoscopy can show how severe your diverticula are and whether something else may be causing your symptoms. Follow these instructions at home: Medicines  Take over-the-counter and prescription medicines only as told by your health care provider. These include fiber supplements, probiotics, and stool softeners.  If you were prescribed an antibiotic medicine, take it as told by your health care provider. Do not stop taking the antibiotic even if you start to feel better.  Do not drive or use heavy machinery while taking prescription pain medicine. General instructions  Follow a full liquid diet or another diet as directed by your health care provider. After your symptoms improve, your health care provider may tell you to  change your diet. He or she may recommend that you eat a diet that contains at least 25 g (25 grams) of fiber daily. Fiber makes it easier to pass stool. Healthy sources of fiber include: ? Berries. One cup contains 4-8 grams of fiber. ? Beans or lentils. One half cup  contains 5-8 grams of fiber. ? Green vegetables. One cup contains 4 grams of fiber.  Exercise for at least 30 minutes, 3 times each week. You should exercise hard enough to raise your heart rate and break a sweat.  Keep all follow-up visits as told by your health care provider. This is important. You may need a colonoscopy. Contact a health care provider if:  Your pain does not improve.  You have a hard time drinking or eating food.  Your bowel movements do not return to normal. Get help right away if:  Your pain gets worse.  Your symptoms do not get better with treatment.  Your symptoms suddenly get worse.  You have a fever.  You vomit more than one time.  You have stools that are bloody, black, or tarry. Summary  Diverticulitis is infection or inflammation of small pouches (diverticula) in the colon that form due to a condition called diverticulosis. Diverticula can trap stool (feces) and bacteria, causing infection and inflammation.  You are at higher risk for this condition if you have diverticulosis and you eat a diet that does not include enough fiber.  Most cases of this condition are mild and can be treated at home. More severe cases may need to be treated at a hospital.  When your condition is under control, your health care provider may recommend that you have an exam called a colonoscopy. This exam can show how severe your diverticula are and whether something else may be causing your symptoms. This information is not intended to replace advice given to you by your health care provider. Make sure you discuss any questions you have with your health care provider. Document Released: 05/28/2005 Document Revised: 09/20/2016 Document Reviewed: 09/20/2016 Elsevier Interactive Patient Education  [employer].

## 2018-04-08 LAB — URINALYSIS W MICROSCOPIC + REFLEX CULTURE
BILIRUBIN URINE: NEGATIVE
Bacteria, UA: NONE SEEN /HPF
GLUCOSE, UA: NEGATIVE
Hyaline Cast: NONE SEEN /LPF
Ketones, ur: NEGATIVE
NITRITES URINE, INITIAL: NEGATIVE
PROTEIN: NEGATIVE
Specific Gravity, Urine: 1.009 (ref 1.001–1.03)
pH: 7.5 (ref 5.0–8.0)

## 2018-04-08 LAB — CBC WITH DIFFERENTIAL/PLATELET
BASOS ABS: 58 {cells}/uL (ref 0–200)
Basophils Relative: 0.5 %
Eosinophils Absolute: 116 cells/uL (ref 15–500)
Eosinophils Relative: 1 %
HEMATOCRIT: 40.3 % (ref 35.0–45.0)
HEMOGLOBIN: 13.7 g/dL (ref 11.7–15.5)
LYMPHS ABS: 789 {cells}/uL — AB (ref 850–3900)
MCH: 30.7 pg (ref 27.0–33.0)
MCHC: 34 g/dL (ref 32.0–36.0)
MCV: 90.4 fL (ref 80.0–100.0)
MONOS PCT: 6.6 %
MPV: 10.3 fL (ref 7.5–12.5)
NEUTROS ABS: 9872 {cells}/uL — AB (ref 1500–7800)
Neutrophils Relative %: 85.1 %
Platelets: 304 10*3/uL (ref 140–400)
RBC: 4.46 10*6/uL (ref 3.80–5.10)
RDW: 12.4 % (ref 11.0–15.0)
Total Lymphocyte: 6.8 %
WBC mixed population: 766 cells/uL (ref 200–950)
WBC: 11.6 10*3/uL — AB (ref 3.8–10.8)

## 2018-04-08 LAB — URINE CULTURE
MICRO NUMBER:: 90934187
SPECIMEN QUALITY:: ADEQUATE

## 2018-04-08 LAB — COMPLETE METABOLIC PANEL WITH GFR
AG Ratio: 1.6 (calc) (ref 1.0–2.5)
ALBUMIN MSPROF: 4.7 g/dL (ref 3.6–5.1)
ALKALINE PHOSPHATASE (APISO): 81 U/L (ref 33–130)
ALT: 20 U/L (ref 6–29)
AST: 20 U/L (ref 10–35)
BUN: 10 mg/dL (ref 7–25)
CALCIUM: 10 mg/dL (ref 8.6–10.4)
CO2: 25 mmol/L (ref 20–32)
CREATININE: 0.69 mg/dL (ref 0.50–0.99)
Chloride: 97 mmol/L — ABNORMAL LOW (ref 98–110)
GFR, EST NON AFRICAN AMERICAN: 89 mL/min/{1.73_m2} (ref >=60)
GFR, Est African American: 104 mL/min/{1.73_m2} (ref >=60)
GLUCOSE: 114 mg/dL — AB (ref 65–99)
Globulin: 3 g/dL (calc) (ref 1.9–3.7)
Potassium: 4.5 mmol/L (ref 3.5–5.3)
Sodium: 133 mmol/L — ABNORMAL LOW (ref 135–146)
Total Bilirubin: 0.7 mg/dL (ref 0.2–1.2)
Total Protein: 7.7 g/dL (ref 6.1–8.1)

## 2018-04-08 LAB — LIPASE

## 2018-04-08 LAB — CULTURE INDICATED

## 2018-04-08 LAB — AMYLASE: AMYLASE: 26 U/L (ref 21–101)

## 2018-05-18 ENCOUNTER — Other Ambulatory Visit (INDEPENDENT_AMBULATORY_CARE_PROVIDER_SITE_OTHER): Payer: Medicare Other

## 2018-05-18 DIAGNOSIS — Z1212 Encounter for screening for malignant neoplasm of rectum: Principal | ICD-10-CM

## 2018-05-18 DIAGNOSIS — Z1211 Encounter for screening for malignant neoplasm of colon: Secondary | ICD-10-CM | POA: Diagnosis not present

## 2018-05-18 LAB — POC HEMOCCULT BLD/STL (HOME/3-CARD/SCREEN)
Card #2 Fecal Occult Blod, POC: NEGATIVE
Card #3 Fecal Occult Blood, POC: NEGATIVE
Fecal Occult Blood, POC: NEGATIVE

## 2018-06-01 DIAGNOSIS — Z23 Encounter for immunization: Secondary | ICD-10-CM | POA: Diagnosis not present

## 2018-06-30 NOTE — Progress Notes (Signed)
Patient ID: Jacqueline Jenkins, female   DOB: 1951/04/18, 67 y.o.   MRN: [id number]   [facility] VISIT AND CPE  Assessment:   Aneurysm of splenic artery (HCC) Control blood pressure, cholesterol, glucose, increase exercise.   Aneurysm of abdominal vessel (HCC) Control blood pressure, cholesterol, glucose, increase exercise.   Essential hypertension - continue medications, DASH diet, exercise and monitor at home. Call if greater than 130/80.  -     CBC with Differential/Platelet -     BASIC METABOLIC PANEL WITH GFR -     Hepatic function panel -     TSH -     Urinalysis, Routine w reflex microscopic -     Microalbumin / creatinine urine ratio -     EKG 12-Lead  Hyperlipidemia, unspecified hyperlipidemia type -continue medications, check lipids, decrease fatty foods, increase activity.  -     Lipid panel  Prediabetes Discussed general issues about diabetes pathophysiology and management., Educational material distributed., Suggested low cholesterol diet., Encouraged aerobic exercise., Discussed foot care., Reminded to get yearly retinal exam.  Medication management -     Magnesium  Malignant neoplasm of upper-outer quadrant of left breast in female, estrogen receptor positive ([place]) Continue follow up Dr. Lindi Adie  Anemia, unspecified type Monitor CBC  Vitamin D deficiency -     VITAMIN D 25 Hydroxy (Vit-D Deficiency, Fractures)  BRCA2 positive Continue follow up x 2016  Dural arteriovenous fistula Control blood pressure, cholesterol, glucose, increase exercise.   Encounter for Medicare annual wellness exam 1 year  Osteopenia - get dexa, continue Vit D and Ca, weight bearing exercises  Left renal mass Get renal US 6 months from CT  Future Appointments  Date Time Provider [place]  10/19/2018  2:00 PM Vicie Mutters, PA-C [facility] None  03/31/2019  2:00 PM Nicholas Lose, MD [facility] None  05/23/2019  8:00 AM [facility] VASC 4 - SS [facility] VVS   05/23/2019  9:00 AM Serafina Mitchell, MD [facility]    Plan:   During the course of the visit the patient was educated and counseled about appropriate screening and preventive services including:    Pneumococcal vaccine   Influenza vaccine  Td vaccine  Screening electrocardiogram  Bone densitometry screening  Colorectal cancer screening  Diabetes screening  Glaucoma screening  Nutrition counseling   Advanced directives: requested   Subjective:   Jacqueline Jenkins is a 67 y.o.  female who presents for Medicare Annual Wellness Visit and complete physical.    She has had elevated blood pressure without the diagnosis of HTN.   Her blood pressure has been controlled at home, & today their BP is BP: 124/70 She does workout. She is adding in walking to her daily schedule.  She denies chest pain, shortness of breath, dizziness. No leg swelling.  She follows with Dr. Abbie Sons for  large 5 cm splenic artery aneurysm.  She underwent: Embolization in June 2013.   She has history of L breast lumpectomy (+ER/+PR/HER2 Neg)  in Dec 2016 followed by chemoradiation, underwent BSO due to + BRCA2 mutation, on anastrazole, and follows with Dr. Lindi Adie. Will get DEXA in jan with follow up.   Has history of diverticulitis, on recent CT AB 04/2018 showed renal cyst versus mass changed from 2013 but not from 2017. With cancer history and BRCA mutation will get repeat study 1 year.   She is on cholesterol medication, zetia has been of x June due to muscle thigh pain that improved.  Her cholesterol is not at goal. The cholesterol last visit was:  Lab Results  Component Value Date   CHOL 213 (H) 12/28/2017   HDL 64 12/28/2017   LDLCALC 124 (H) 12/28/2017   TRIG 139 12/28/2017   CHOLHDL 3.3 12/28/2017   She has had prediabetes.  She has not been working on diet and exercise for prediabetes, and denies foot ulcerations, hyperglycemia, hypoglycemia , increased appetite, nausea, paresthesia of the  feet, polydipsia, polyuria, visual disturbances, vomiting and weight loss. Last A1C in the office was:  Lab Results  Component Value Date   HGBA1C 5.8 (H) 12/28/2017   Patient is on Vitamin D supplement,  2000 IU daily.   Lab Results  Component Value Date   VD25OH 22 (L) 12/28/2017     BMI is Body mass index is 26.86 kg/m., she is working on diet and exercise. Wt Readings from Last 3 Encounters:  07/01/18 161 lb 6.4 oz (73.2 kg)  04/06/18 177 lb (80.3 kg)  03/30/18 178 lb 8 oz (81 kg)    Medication Review: Current Outpatient Medications on File Prior to Visit  Medication Sig Dispense Refill  . anastrozole (ARIMIDEX) 1 MG tablet Take 1 tablet (1 mg total) by mouth daily at 3 pm. 90 tablet 3  . benazepril-hydrochlorthiazide (LOTENSIN HCT) 20-12.5 MG tablet TAKE 1 TABLET BY MOUTH EVERY DAY 90 tablet 1  . Calcium Carb-Cholecalciferol (CALCIUM 1000 + D PO) Take 2 tablets by mouth daily. Gummies    . cholecalciferol (VITAMIN D) 1000 units tablet Take 1,000 Units by mouth daily.    . naproxen sodium (ANAPROX) 220 MG tablet Take 220 mg by mouth daily as needed (pain).    Marland Kitchen ezetimibe (ZETIA) 10 MG tablet TAKE 1 TABLET DAILY FOR CHOLESTEROL (Patient not taking: Reported on 07/01/2018) 90 tablet 1   No current facility-administered medications on file prior to visit.     Current Problems (verified) Patient Active Problem List   Diagnosis Date Noted  . Left kidney mass 04/06/2018  . FHx: heart disease 12/28/2017  . Osteopenia 06/17/2017  . Dural arteriovenous fistula 03/05/2017  . Genetic testing 09/19/2015  . BRCA2 positive 09/19/2015  . Breast cancer of upper-outer quadrant of left female breast ([place]) 08/21/2015  . Medication management 06/07/2015  . HTN (hypertension) 06/07/2015  . Prediabetes 11/16/2013  . Allergic rhinitis   . Hyperlipidemia   . DDD (degenerative disc disease)   . Anemia   . Vitamin D deficiency   . Aneurysm of splenic artery ([place]) 01/19/2012    Screening  Tests Immunization History  Administered Date(s) Administered  . DT 01/22/2015  . Influenza Split 06/08/2014, 06/02/2016  . Influenza, High Dose Seasonal PF 06/08/2014  . Influenza-Unspecified 06/14/2013, 06/04/2015, 05/27/2017  . Meningococcal Conjugate 09/02/2011  . PPD Test 01/18/2014  . Pneumococcal Conjugate-13 12/31/2011, 06/17/2017  . Pneumococcal-Unspecified 09/22/1995, 09/02/2011  . Td 11/27/1994, 04/29/2004, 01/22/2015   UTD on vaccines  Preventative care: Last colonoscopy: 2013 DEXA 07/2015 will get in Jan with Dr. Lindi Adie [facility] 09/2017 PAP 2018 Echo 2017 US carotid 11/2016 [place] AB 2013  Follows Dr. Marvel Plan [facility]  Names of Other Physician/Practitioners you currently use: 1. [facility] here for primary care 2. Dr. Kathrin Penner, eye doctor, last visit May 2018 3. Dr. Jason Coop, dentist, last visit Jan 2018 Patient Care Team: Unk Pinto, MD as PCP - General (Internal Medicine) Ralene Bathe, MD as Consulting Physician (Ophthalmology) Serafina Mitchell, MD as Consulting Physician (Vascular Surgery) Carol Ada, MD as  Consulting Physician (Gastroenterology) Martinique, Amy, MD as Consulting Physician (Dermatology) Nicholas Lose, MD as Consulting Physician (Hematology and Oncology) Excell Seltzer, MD as Consulting Physician (General Surgery)  History reviewed: allergies, current medications, past family history, past medical history, past social history, past surgical history and problem list Allergies Allergies  Allergen Reactions  . Lescol [Fluvastatin Sodium] Other (See Comments)    Myalgia  . Lipitor [Atorvastatin] Other (See Comments)    Increased LFT's  . Vytorin [Ezetimibe-Simvastatin] Other (See Comments)    myalgia    SURGICAL HISTORY She  has a past surgical history that includes Tonsillectomy; Spine surgery (1998); Spine surgery (2001, 2010); Cervical disc surgery (1998); Lumbar disc surgery (2001,  2010); splenic aneurysm (02/10/2012); Colonoscopy (Jan. 7, 2014); visceral angiogram (N/A, 02/10/2012); Breast lumpectomy with radioactive seed and sentinel lymph node biopsy (Left, 08/29/2015); Re-excision of breast lumpectomy (Left, 09/07/2015); Portacath placement (Bilateral, 10/09/2015); Portacath removal; Laparoscopic bilateral salpingo oophorectomy (Bilateral, 05/16/2016); IR ANGIO INTRA EXTRACRAN SEL INTERNAL CAROTID BILAT MOD SED (02/03/2017); IR NEURO EACH ADD'L AFTER BASIC UNI RIGHT (MS) (02/03/2017); IR ANGIO VERTEBRAL SEL VERTEBRAL BILAT MOD SED (02/03/2017); IR ANGIO EXTERNAL CAROTID SEL EXT CAROTID BILAT MOD SED (02/03/2017); Breast lumpectomy; Radiology with anesthesia (N/A, 03/05/2017); IR NEURO EACH ADD'L AFTER BASIC UNI RIGHT (MS) (03/05/2017); IR Angiogram Follow Up Study (03/05/2017); IR Transcath/Emboliz (03/05/2017); IR ANGIO INTRA EXTRACRAN SEL COM CAROTID INNOMINATE UNI R MOD SED (03/05/2017); and IR ANGIO EXTERNAL CAROTID SEL EXT CAROTID UNI R MOD SED (03/05/2017).   FAMILY HISTORY Her family history includes Breast cancer in her cousin and maternal grandmother; Colon cancer in her maternal uncle; Deep vein thrombosis in her mother; Diverticulitis in her mother; Heart Problems in her paternal grandfather; Heart attack in her father, paternal aunt, paternal grandmother, and paternal uncle; Heart defect in her maternal aunt; Hyperlipidemia in her mother; Hypertension in her father and mother; Leukemia in her maternal aunt; Lung cancer in her maternal uncle; Osteoporosis in her mother; Other in her father, mother, and sister; Pancreatic cancer in her maternal uncle; Stroke in her father, maternal grandfather, mother, paternal aunt, paternal grandmother, and paternal uncle.   SOCIAL HISTORY She  reports that she has never smoked. She has never used smokeless tobacco. She reports that she drinks alcohol. She reports that she does not use drugs.  MEDICARE WELLNESS OBJECTIVES: Physical activity: Current Exercise  Habits: Home exercise routine, Type of exercise: walking, Time (Minutes): 10, Frequency (Times/Week): 7, Weekly Exercise (Minutes/Week): 70, Intensity: Mild Cardiac risk factors: Cardiac Risk Factors include: advanced age (>50mn, >>26women);hypertension;sedentary lifestyle;dyslipidemia Depression/mood screen:   [facility] 07/01/2018  Decreased Interest 0  Down, Depressed, Hopeless 0  PHQ - 2 Score 0    ADLs:  In your present state of health, do you have any difficulty performing the following activities: 07/01/2018 12/28/2017  Hearing? N N  Vision? N N  Difficulty concentrating or making decisions? N N  Walking or climbing stairs? N N  Dressing or bathing? N N  Doing errands, shopping? N N  Some recent data might be hidden     Cognitive Testing  Alert? Yes  Normal Appearance?Yes  Oriented to person? Yes  Place? Yes   Time? Yes  Recall of three objects?  Yes  Can perform simple calculations? Yes  Displays appropriate judgment?Yes  Can read the correct time from a watch face?Yes  EOL planning: Does Patient Have a Medical Advance Directive?: Yes Type of Advance Directive: [place] Does patient want to  make changes to medical advance directive?: No - Patient declined    Review of Systems  Constitutional: Positive for malaise/fatigue. Negative for chills, fever and weight loss.  HENT: Negative for congestion, ear pain, nosebleeds and sore throat.   Eyes: Negative.   Respiratory: Negative for cough, shortness of breath and wheezing.   Cardiovascular: Negative for chest pain, palpitations and leg swelling.  Gastrointestinal: Positive for heartburn (occasionally). Negative for blood in stool, constipation, diarrhea, melena, nausea and vomiting.  Genitourinary: Positive for frequency. Negative for dysuria and urgency.  Musculoskeletal: Negative.   Skin: Negative.   Neurological: Negative for dizziness, sensory change, loss of  consciousness and headaches.  Psychiatric/Behavioral: Negative for depression. The patient is not nervous/anxious and does not have insomnia.      Objective:     BP 124/70   Pulse 74   Temp 98.1 F (36.7 C)   Resp 16   Ht _0  (1.651 m)   Wt 161 lb 6.4 oz (73.2 kg)   SpO2 98%   BMI 26.86 kg/m   General Appearance: Well nourished, alert, WD/WN, female and in no apparent distress. Eyes: PERRLA, EOMs, conjunctiva no swelling or erythema, normal fundi and vessels. Sinuses: No frontal/maxillary tenderness ENT/Mouth: EACs patent / TMs  nl. Nares clear without erythema, swelling, mucoid exudates. Oral hygiene is good. No erythema, swelling, or exudate. Tongue normal, non-obstructing. Tonsils not swollen or erythematous. Hearing normal.  Neck: Supple, thyroid normal. No bruits, nodes or JVD. Respiratory: Respiratory effort normal.  BS equal and clear bilateral without rales, rhonci, wheezing or stridor. Cardio: Heart sounds are normal with regular rate and rhythm and no murmurs, rubs or gallops. Peripheral pulses are normal and equal bilaterally without edema. No aortic or femoral bruits. Chest: symmetric with normal excursions and percussion. Breasts: Symmetric, without lumps, nipple discharge, retractions, or fibrocystic changes.  Abdomen: Flat, soft  with nl bowel sounds. Nontender, no guarding, rebound, hernias, masses, or organomegaly.  Lymphatics: Non tender without lymphadenopathy.  Genitourinary: Deferred to ObGyn Musculoskeletal: Full ROM all peripheral extremities, joint stability, 5/5 strength, and normal gait. Skin: Warm and dry without rashes, lesions, cyanosis, clubbing or  ecchymosis. Multiple seborrheic keratosis across the chest and the abdomen. Neuro: Cranial nerves intact, reflexes equal bilaterally. Normal muscle tone, no cerebellar symptoms. Sensation intact.  Pysch: Alert and oriented X 3, normal affect, Insight and Judgment appropriate.    Medicare Attestation I  have personally reviewed: The patient's medical and social history Their use of alcohol, tobacco or illicit drugs Their current medications and supplements The patient's functional ability including ADLs,fall risks, home safety risks, cognitive, and hearing and visual impairment Diet and physical activities Evidence for depression or mood disorders  The patient's weight, height, BMI, and visual acuity have been recorded in the chart.  I have made referrals, counseling, and provided education to the patient based on review of the above and I have provided the patient with a written personalized care plan for preventive services.  Over 40 minutes of exam, counseling, chart review was performed.   Vicie Mutters, PA-C   07/01/2018

## 2018-07-01 ENCOUNTER — Ambulatory Visit (INDEPENDENT_AMBULATORY_CARE_PROVIDER_SITE_OTHER): Payer: Medicare Other | Admitting: Physician Assistant

## 2018-07-01 ENCOUNTER — Encounter: Payer: Self-pay | Admitting: Physician Assistant

## 2018-07-01 VITALS — BP 124/70 | HR 74 | Temp 98.1°F | Resp 16 | Ht 65.0 in | Wt 161.4 lb

## 2018-07-01 DIAGNOSIS — Z1509 Genetic susceptibility to other malignant neoplasm: Secondary | ICD-10-CM

## 2018-07-01 DIAGNOSIS — D649 Anemia, unspecified: Secondary | ICD-10-CM

## 2018-07-01 DIAGNOSIS — M8580 Other specified disorders of bone density and structure, unspecified site: Secondary | ICD-10-CM | POA: Diagnosis not present

## 2018-07-01 DIAGNOSIS — Z1501 Genetic susceptibility to malignant neoplasm of breast: Secondary | ICD-10-CM

## 2018-07-01 DIAGNOSIS — Z170 Estrogen receptor positive status [ER+]: Secondary | ICD-10-CM

## 2018-07-01 DIAGNOSIS — R7303 Prediabetes: Secondary | ICD-10-CM

## 2018-07-01 DIAGNOSIS — I671 Cerebral aneurysm, nonruptured: Secondary | ICD-10-CM

## 2018-07-01 DIAGNOSIS — J309 Allergic rhinitis, unspecified: Secondary | ICD-10-CM

## 2018-07-01 DIAGNOSIS — N2889 Other specified disorders of kidney and ureter: Secondary | ICD-10-CM

## 2018-07-01 DIAGNOSIS — C50412 Malignant neoplasm of upper-outer quadrant of left female breast: Secondary | ICD-10-CM | POA: Diagnosis not present

## 2018-07-01 DIAGNOSIS — E559 Vitamin D deficiency, unspecified: Secondary | ICD-10-CM | POA: Diagnosis not present

## 2018-07-01 DIAGNOSIS — R6889 Other general symptoms and signs: Secondary | ICD-10-CM | POA: Diagnosis not present

## 2018-07-01 DIAGNOSIS — Z79899 Other long term (current) drug therapy: Secondary | ICD-10-CM

## 2018-07-01 DIAGNOSIS — I728 Aneurysm of other specified arteries: Secondary | ICD-10-CM | POA: Diagnosis not present

## 2018-07-01 DIAGNOSIS — Z0000 Encounter for general adult medical examination without abnormal findings: Secondary | ICD-10-CM

## 2018-07-01 DIAGNOSIS — E785 Hyperlipidemia, unspecified: Secondary | ICD-10-CM | POA: Diagnosis not present

## 2018-07-01 DIAGNOSIS — Z8249 Family history of ischemic heart disease and other diseases of the circulatory system: Secondary | ICD-10-CM

## 2018-07-01 DIAGNOSIS — I10 Essential (primary) hypertension: Secondary | ICD-10-CM

## 2018-07-01 DIAGNOSIS — Z0001 Encounter for general adult medical examination with abnormal findings: Secondary | ICD-10-CM

## 2018-07-01 NOTE — Patient Instructions (Addendum)
Can do citracel or benefiber or add on fiber supplement to prevent flare of diverticulitis If you do have a flare such as left lower quadrant pain, diarrhea avoid fiber, do bowel rest which involves liquid diet at first with broth and jello and then slowly advance your diet to bland foods such as potatoes, noodles, etc and then add back in fiber.    Diverticulitis Diverticulitis is inflammation or infection of small pouches in your colon that form when you have a condition called diverticulosis. The pouches in your colon are called diverticula. Your colon, or large intestine, is where water is absorbed and stool is formed. Complications of diverticulitis can include:  Bleeding.  Severe infection.  Severe pain.  Perforation of your colon.  Obstruction of your colon.  What are the causes? Diverticulitis is caused by bacteria. Diverticulitis happens when stool becomes trapped in diverticula. This allows bacteria to grow in the diverticula, which can lead to inflammation and infection. What increases the risk? People with diverticulosis are at risk for diverticulitis. Eating a diet that does not include enough fiber from fruits and vegetables may make diverticulitis more likely to develop. What are the signs or symptoms? Symptoms of diverticulitis may include:  Abdominal pain and tenderness. The pain is normally located on the left side of the abdomen, but may occur in other areas.  Fever and chills.  Bloating.  Cramping.  Nausea.  Vomiting.  Constipation.  Diarrhea.  Blood in your stool.  How is this diagnosed? Your health care provider will ask you about your medical history and do a physical exam. You may need to have tests done because many medical conditions can cause the same symptoms as diverticulitis. Tests may include:  Blood tests.  Urine tests.  Imaging tests of the abdomen, including X-rays and CT scans.  When your condition is under control, your health  care provider may recommend that you have a colonoscopy. A colonoscopy can show how severe your diverticula are and whether something else is causing your symptoms. How is this treated? Most cases of diverticulitis are mild and can be treated at home. Treatment may include:  Taking over-the-counter pain medicines.  Following a clear liquid diet.  Taking antibiotic medicines by mouth for 7-10 days.  More severe cases may be treated at a hospital. Treatment may include:  Not eating or drinking.  Taking prescription pain medicine.  Receiving antibiotic medicines through an IV tube.  Receiving fluids and nutrition through an IV tube.  Surgery.  Follow these instructions at home:  Follow your health care provider's instructions carefully.  Follow a full liquid diet or other diet as directed by your health care provider. After your symptoms improve, your health care provider may tell you to change your diet. He or she may recommend you eat a high-fiber diet. Fruits and vegetables are good sources of fiber. Fiber makes it easier to pass stool.  Take fiber supplements or probiotics as directed by your health care provider.  Only take medicines as directed by your health care provider.  Keep all your follow-up appointments. Contact a health care provider if:  Your pain does not improve.  You have a hard time eating food.  Your bowel movements do not return to normal. Get help right away if:  Your pain becomes worse.  Your symptoms do not get better.  Your symptoms suddenly get worse.  You have a fever.  You have repeated vomiting.  You have bloody or black, tarry stools. This information  is not intended to replace advice given to you by your health care provider. Make sure you discuss any questions you have with your health care provider. Document Released: 05/28/2005 Document Revised: 01/24/2016 Document Reviewed: 07/13/2013 Elsevier Interactive Patient Education  2017  [place] D IS IMPORTANT  Vitamin D goal is between 60-80  Please make sure that you are taking your Vitamin D as directed.   It is very important as a natural anti-inflammatory   helping hair, skin, and nails, as well as reducing stroke and heart attack risk.   It helps your bones and helps with mood.  We want you on at least 5000 IU daily  It also decreases numerous cancer risks so please take it as directed.   Low Vit D is associated with a 200-300% higher risk for CANCER   and 200-300% higher risk for HEART   ATTACK  &  STROKE.    .....................................Marland Kitchen  It is also associated with higher death rate at younger ages,   autoimmune diseases like Rheumatoid arthritis, Lupus, Multiple Sclerosis.     Also many other serious conditions, like depression, Alzheimer's  Dementia, infertility, muscle aches, fatigue, fibromyalgia - just to name a few.  +++++++++++++++++++  Can get liquid vitamin D from [place] here in [place] at  [facility] alternatives [address], [place], [place] [address] Or you can try earth fare

## 2018-07-02 LAB — CBC WITH DIFFERENTIAL/PLATELET
BASOS ABS: 69 {cells}/uL (ref 0–200)
Basophils Relative: 1.1 %
EOS PCT: 6.3 %
Eosinophils Absolute: 397 cells/uL (ref 15–500)
HCT: 38 % (ref 35.0–45.0)
Hemoglobin: 12.9 g/dL (ref 11.7–15.5)
Lymphs Abs: 1115 cells/uL (ref 850–3900)
MCH: 30.6 pg (ref 27.0–33.0)
MCHC: 33.9 g/dL (ref 32.0–36.0)
MCV: 90.3 fL (ref 80.0–100.0)
MONOS PCT: 6 %
MPV: 10.2 fL (ref 7.5–12.5)
NEUTROS PCT: 68.9 %
Neutro Abs: 4341 cells/uL (ref 1500–7800)
PLATELETS: 285 10*3/uL (ref 140–400)
RBC: 4.21 10*6/uL (ref 3.80–5.10)
RDW: 12.7 % (ref 11.0–15.0)
TOTAL LYMPHOCYTE: 17.7 %
WBC mixed population: 378 cells/uL (ref 200–950)
WBC: 6.3 10*3/uL (ref 3.8–10.8)

## 2018-07-02 LAB — COMPLETE METABOLIC PANEL WITH GFR
AG Ratio: 1.8 (calc) (ref 1.0–2.5)
ALBUMIN MSPROF: 4.6 g/dL (ref 3.6–5.1)
ALKALINE PHOSPHATASE (APISO): 75 U/L (ref 33–130)
ALT: 19 U/L (ref 6–29)
AST: 24 U/L (ref 10–35)
BILIRUBIN TOTAL: 0.4 mg/dL (ref 0.2–1.2)
BUN: 15 mg/dL (ref 7–25)
CHLORIDE: 98 mmol/L (ref 98–110)
CO2: 26 mmol/L (ref 20–32)
CREATININE: 0.86 mg/dL (ref 0.50–0.99)
Calcium: 9.9 mg/dL (ref 8.6–10.4)
GFR, Est African American: 80 mL/min/{1.73_m2} (ref >=60)
GFR, Est Non African American: 69 mL/min/{1.73_m2} (ref >=60)
GLUCOSE: 91 mg/dL (ref 65–99)
Globulin: 2.5 g/dL (calc) (ref 1.9–3.7)
Potassium: 4.3 mmol/L (ref 3.5–5.3)
Sodium: 134 mmol/L — ABNORMAL LOW (ref 135–146)
Total Protein: 7.1 g/dL (ref 6.1–8.1)

## 2018-07-02 LAB — LIPID PANEL
CHOL/HDL RATIO: 4.5 (calc) (ref <5.0)
CHOLESTEROL: 268 mg/dL — AB (ref <200)
HDL: 60 mg/dL (ref >50)
LDL CHOLESTEROL (CALC): 182 mg/dL — AB
Non-HDL Cholesterol (Calc): 208 mg/dL (calc) — ABNORMAL HIGH (ref <130)
TRIGLYCERIDES: 128 mg/dL (ref <150)

## 2018-07-02 LAB — HEMOGLOBIN A1C
Hgb A1c MFr Bld: 5.7 % of total Hgb — ABNORMAL HIGH (ref <5.7)
MEAN PLASMA GLUCOSE: 117 (calc)
eAG (mmol/L): 6.5 (calc)

## 2018-07-02 LAB — TSH: TSH: 0.86 m[IU]/L (ref 0.40–4.50)

## 2018-07-02 LAB — MAGNESIUM: Magnesium: 1.8 mg/dL (ref 1.5–2.5)

## 2018-07-03 ENCOUNTER — Other Ambulatory Visit: Payer: Self-pay | Admitting: Internal Medicine

## 2018-07-03 DIAGNOSIS — E782 Mixed hyperlipidemia: Secondary | ICD-10-CM

## 2018-07-03 MED ORDER — GEMFIBROZIL 600 MG PO TABS: ORAL_TABLET | ORAL | 1 refills | Status: DC

## 2018-07-11 ENCOUNTER — Other Ambulatory Visit: Payer: Self-pay | Admitting: Internal Medicine

## 2018-07-23 ENCOUNTER — Other Ambulatory Visit: Payer: Self-pay | Admitting: Hematology and Oncology

## 2018-07-23 DIAGNOSIS — Z9889 Other specified postprocedural states: Secondary | ICD-10-CM

## 2018-09-06 ENCOUNTER — Ambulatory Visit
Admission: RE | Admit: 2018-09-06 | Discharge: 2018-09-06 | Disposition: A | Discharge status: Home / Self Care | Payer: Medicare Other | Source: Ambulatory Visit | Attending: Hematology and Oncology | Admitting: Hematology and Oncology

## 2018-09-06 DIAGNOSIS — Z853 Personal history of malignant neoplasm of breast: Secondary | ICD-10-CM | POA: Diagnosis not present

## 2018-09-06 DIAGNOSIS — R928 Other abnormal and inconclusive findings on diagnostic imaging of breast: Secondary | ICD-10-CM | POA: Diagnosis not present

## 2018-09-06 DIAGNOSIS — Z9889 Other specified postprocedural states: Secondary | ICD-10-CM

## 2018-09-06 HISTORY — DX: Personal history of irradiation: Z92.3

## 2018-09-06 HISTORY — DX: Malignant neoplasm of unspecified site of unspecified female breast: C50.919

## 2018-09-06 HISTORY — DX: Personal history of antineoplastic chemotherapy: Z92.21

## 2018-09-14 DIAGNOSIS — L570 Actinic keratosis: Secondary | ICD-10-CM | POA: Diagnosis not present

## 2018-09-14 DIAGNOSIS — D2271 Melanocytic nevi of right lower limb, including hip: Secondary | ICD-10-CM | POA: Diagnosis not present

## 2018-09-14 DIAGNOSIS — D2262 Melanocytic nevi of left upper limb, including shoulder: Secondary | ICD-10-CM | POA: Diagnosis not present

## 2018-09-14 DIAGNOSIS — L821 Other seborrheic keratosis: Secondary | ICD-10-CM | POA: Diagnosis not present

## 2018-10-18 NOTE — Progress Notes (Signed)
Patient ID: Jacqueline Jenkins, female   DOB: 12-03-1950, 68 y.o.   MRN: [id number]   CPE  Assessment:   Aneurysm of splenic artery (HCC) Control blood pressure, cholesterol, glucose, increase exercise.  Check EKG/Aorta screening - check vasculitis labs  Essential hypertension - continue medications, DASH diet, exercise and monitor at home. Call if greater than 130/80.  -     CBC with Differential/Platelet -     BASIC METABOLIC PANEL WITH GFR -     Hepatic function panel -     TSH -     Urinalysis, Routine w reflex microscopic -     Microalbumin / creatinine urine ratio -     EKG 12-Lead  Hyperlipidemia, unspecified hyperlipidemia type -intolerant of statins (multiple tried), zetia, and lopid, ? Familial hypercholesterolemia, suggest referral to lipid clinic, declines at this time, check lipids, decrease fatty foods, increase activity.  -     Lipid panel  Prediabetes Discussed general issues about diabetes pathophysiology and management., Educational material distributed., Suggested low cholesterol diet., Encouraged aerobic exercise., Discussed foot care., Reminded to get yearly retinal exam.  Medication management -     Magnesium  Malignant neoplasm of upper-outer quadrant of left breast in female, estrogen receptor positive ([place]) Continue follow up Dr. Lindi Adie  Anemia, unspecified type Monitor CBC  Vitamin D deficiency -     VITAMIN D 25 Hydroxy (Vit-D Deficiency, Fractures)  BRCA2 positive Continue follow up x 2016   Dural arteriovenous fistula Control blood pressure, cholesterol, glucose, increase exercise.   Osteopenia - get dexa, continue Vit D and Ca, weight bearing exercises  Left renal mass Repeat in August  Future Appointments  Date Time Provider [place]  03/31/2019  2:00 PM Nicholas Lose, MD [facility] None  05/23/2019  8:00 AM [facility] 4 - [facility]  05/23/2019  9:00 AM Serafina Mitchell, MD [facility]  07/07/2019  3:45 PM Vicie Mutters,  PA-C [facility] None  10/25/2019  2:00 PM Vicie Mutters, PA-C [facility] None     Subjective:   Jacqueline Jenkins is a 68 y.o.  female who presents for complete physical.    She states her sister died from autoimmune vasculitis, she was having a tooth issues and was on amoxicillin, had swelling in her left eye, lip. She is concerned for autoimmune.   She has had elevated blood pressure without the diagnosis of HTN.   Her blood pressure has been controlled at home, & today their BP is BP: 124/76 She does workout. She is adding in walking to her daily schedule.  She denies chest pain, shortness of breath, dizziness. No leg swelling.  She follows with Dr. Abbie Sons for  large 5 cm splenic artery aneurysm.  She underwent: Embolization in June 2013.   She has history of L breast lumpectomy (+ER/+PR/HER2 Neg)  in Dec 2016 followed by chemoradiation, underwent BSO due to + BRCA2 mutation, on anastrazole, and follows with Dr. Lindi Adie. Will get DEXA in jan with follow up.   Has history of diverticulitis, on recent CT AB 04/2018 showed renal cyst versus mass changed from 2013 but not from 2017. With cancer history and BRCA mutation will get repeat study 1 year.   She is on cholesterol medication, she could not tolerate zetia or crestor due to severe muscle aches and stomach pain.  Her cholesterol is not at goal. The cholesterol last visit was:  Lab Results  Component Value Date   CHOL 268 (H) 07/01/2018   HDL  60 07/01/2018   LDLCALC 182 (H) 07/01/2018   TRIG 128 07/01/2018   CHOLHDL 4.5 07/01/2018   She has had prediabetes.  She has not been working on diet and exercise for prediabetes, and denies foot ulcerations, hyperglycemia, hypoglycemia , increased appetite, nausea, paresthesia of the feet, polydipsia, polyuria, visual disturbances, vomiting and weight loss. Last A1C in the office was:  Lab Results  Component Value Date   HGBA1C 5.7 (H) 07/01/2018   Patient is on Vitamin D supplement,  4000  IU daily.   Lab Results  Component Value Date   VD25OH 22 (L) 12/28/2017     BMI is Body mass index is 27.15 kg/m., she is working on diet and exercise. Wt Readings from Last 3 Encounters:  10/19/18 158 lb 3.2 oz (71.8 kg)  07/01/18 161 lb 6.4 oz (73.2 kg)  04/06/18 177 lb (80.3 kg)    Medication Review: Current Outpatient Medications on File Prior to Visit  Medication Sig Dispense Refill  . anastrozole (ARIMIDEX) 1 MG tablet Take 1 tablet (1 mg total) by mouth daily at 3 pm. 90 tablet 3  . benazepril-hydrochlorthiazide (LOTENSIN HCT) 20-12.5 MG tablet TAKE 1 TABLET BY MOUTH EVERY DAY 90 tablet 3  . Calcium Carb-Cholecalciferol (CALCIUM 1000 + D PO) Take 2 tablets by mouth daily. Gummies    . cholecalciferol (VITAMIN D) 1000 units tablet Take 1,000 Units by mouth daily.    . naproxen sodium (ANAPROX) 220 MG tablet Take 220 mg by mouth daily as needed (pain).     No current facility-administered medications on file prior to visit.     Current Problems (verified) Patient Active Problem List   Diagnosis Date Noted  . Left kidney mass 04/06/2018  . FHx: heart disease 12/28/2017  . Osteopenia 06/17/2017  . Dural arteriovenous fistula 03/05/2017  . Genetic testing 09/19/2015  . BRCA2 positive 09/19/2015  . Breast cancer of upper-outer quadrant of left female breast ([place]) 08/21/2015  . Medication management 06/07/2015  . HTN (hypertension) 06/07/2015  . Prediabetes 11/16/2013  . Allergic rhinitis   . Hyperlipidemia   . DDD (degenerative disc disease)   . Anemia   . Vitamin D deficiency   . Aneurysm of splenic artery ([place]) 01/19/2012    Screening Tests Immunization History  Administered Date(s) Administered  . DT 01/22/2015  . Influenza Split 06/08/2014, 06/02/2016  . Influenza, High Dose Seasonal PF 06/08/2014, 05/27/2017, 06/01/2018  . Influenza-Unspecified 06/14/2013, 06/04/2015, 05/27/2017  . Meningococcal Conjugate 09/02/2011  . PPD Test 01/18/2014  . Pneumococcal  Conjugate-13 12/31/2011, 06/17/2017  . Pneumococcal Polysaccharide-23 09/02/2011  . Pneumococcal-Unspecified 09/22/1995, 09/02/2011  . Td 11/27/1994, 04/29/2004, 01/22/2015   UTD on vaccines  Preventative care: Last colonoscopy: 2013 DEXA 07/2015 will get with Dr. Lindi Adie [facility] 09/2018 PAP 2018 Echo 2017 US carotid 11/2016 [place] AB 2013  Names of Other Physician/Practitioners you currently use: 1. [facility] here for primary care 2. Dr. Kathrin Penner, eye doctor, last visit q 6 months, wears glasses  3. Dr. Jason Coop, dentist, last visit Jan 2018 4. Dr. Hardie Shackleton did dental implant.  Patient Care Team: Unk Pinto, MD as PCP - General (Internal Medicine) Serafina Mitchell, MD as Consulting Physician (Vascular Surgery) Carol Ada, MD as Consulting Physician (Gastroenterology) Martinique, Amy, MD as Consulting Physician (Dermatology) Nicholas Lose, MD as Consulting Physician (Hematology and Oncology) Shon Hough, MD as Consulting Physician (Ophthalmology) Paula Compton, MD as Consulting Physician (Obstetrics and Gynecology)   Allergies Allergies  Allergen Reactions  . Lescol [  Fluvastatin Sodium] Other (See Comments)    Myalgia  . Lipitor [Atorvastatin] Other (See Comments)    Increased LFT's  . Vytorin [Ezetimibe-Simvastatin] Other (See Comments)    myalgia    SURGICAL HISTORY She  has a past surgical history that includes Tonsillectomy; Spine surgery (1998); Spine surgery (2001, 2010); Cervical disc surgery (1998); Lumbar disc surgery (2001, 2010); splenic aneurysm (02/10/2012); Colonoscopy (Jan. 7, 2014); visceral angiogram (N/A, 02/10/2012); Breast lumpectomy with radioactive seed and sentinel lymph node biopsy (Left, 08/29/2015); Re-excision of breast lumpectomy (Left, 09/07/2015); Portacath placement (Bilateral, 10/09/2015); Portacath removal; Laparoscopic bilateral salpingo oophorectomy (Bilateral, 05/16/2016); IR ANGIO INTRA  EXTRACRAN SEL INTERNAL CAROTID BILAT MOD SED (02/03/2017); IR NEURO EACH ADD'L AFTER BASIC UNI RIGHT (MS) (02/03/2017); IR ANGIO VERTEBRAL SEL VERTEBRAL BILAT MOD SED (02/03/2017); IR ANGIO EXTERNAL CAROTID SEL EXT CAROTID BILAT MOD SED (02/03/2017); Radiology with anesthesia (N/A, 03/05/2017); IR NEURO EACH ADD'L AFTER BASIC UNI RIGHT (MS) (03/05/2017); IR Angiogram Follow Up Study (03/05/2017); IR Transcath/Emboliz (03/05/2017); IR ANGIO INTRA EXTRACRAN SEL COM CAROTID INNOMINATE UNI R MOD SED (03/05/2017); IR ANGIO EXTERNAL CAROTID SEL EXT CAROTID UNI R MOD SED (03/05/2017); and Breast lumpectomy (Left, 2016).   FAMILY HISTORY Her family history includes Breast cancer in her cousin and maternal grandmother; Colon cancer in her maternal uncle; Deep vein thrombosis in her mother; Diverticulitis in her mother; Heart Problems in her paternal grandfather; Heart attack in her father, paternal aunt, paternal grandmother, and paternal uncle; Heart defect in her maternal aunt; Hyperlipidemia in her mother; Hypertension in her father and mother; Leukemia in her maternal aunt; Lung cancer in her maternal uncle; Osteoporosis in her mother; Other in her father, mother, and sister; Pancreatic cancer in her maternal uncle; Stroke in her father, maternal grandfather, mother, paternal aunt, paternal grandmother, and paternal uncle; Vasculitis in her sister.   SOCIAL HISTORY She  reports that she has never smoked. She has never used smokeless tobacco. She reports current alcohol use. She reports that she does not use drugs.    Review of Systems  Constitutional: Negative for chills, fever, malaise/fatigue and weight loss.  HENT: Negative for congestion, ear pain, nosebleeds and sore throat.   Eyes: Negative.   Respiratory: Negative for cough, shortness of breath and wheezing.   Cardiovascular: Negative for chest pain, palpitations and leg swelling.  Gastrointestinal: Positive for heartburn (occasionally). Negative for blood in stool,  constipation, diarrhea, melena, nausea and vomiting.  Genitourinary: Negative for dysuria, frequency and urgency.  Musculoskeletal: Negative.   Skin: Negative.   Neurological: Negative for dizziness, sensory change, loss of consciousness and headaches.  Psychiatric/Behavioral: Negative for depression. The patient is not nervous/anxious and does not have insomnia.      Objective:     BP 124/76   Pulse 76   Temp 97.7 F (36.5 C)   Ht 5' 4"  (1.626 m)   Wt 158 lb 3.2 oz (71.8 kg)   SpO2 97%   BMI 27.15 kg/m   General Appearance: Well nourished, alert, WD/WN, female and in no apparent distress. Eyes: PERRLA, EOMs, conjunctiva no swelling or erythema, normal fundi and vessels. Sinuses: No frontal/maxillary tenderness ENT/Mouth: EACs patent / TMs  nl. Nares clear without erythema, swelling, mucoid exudates. Oral hygiene is good. No erythema, swelling, or exudate. Tongue normal, non-obstructing. Tonsils not swollen or erythematous. Hearing normal.  Neck: Supple, thyroid normal. No bruits, nodes or JVD. Respiratory: Respiratory effort normal.  BS equal and clear bilateral without rales, rhonci, wheezing or stridor. Cardio: Heart sounds are normal with  regular rate and rhythm and no murmurs, rubs or gallops. Peripheral pulses are normal and equal bilaterally without edema. No aortic or femoral bruits. Chest: symmetric with normal excursions and percussion. Breasts: Symmetric, without lumps, nipple discharge, retractions, or fibrocystic changes.  Abdomen: Flat, soft  with nl bowel sounds. Nontender, no guarding, rebound, hernias, masses, or organomegaly.  Lymphatics: Non tender without lymphadenopathy.  Genitourinary: Deferred to ObGyn Musculoskeletal: Full ROM all peripheral extremities, joint stability, 5/5 strength, and normal gait. Skin: Warm and dry without rashes, lesions, cyanosis, clubbing or  ecchymosis. Multiple seborrheic keratosis across the chest and the abdomen. Neuro: Cranial  nerves intact, reflexes equal bilaterally. Normal muscle tone, no cerebellar symptoms. Sensation intact.  Pysch: Alert and oriented X 3, normal affect, Insight and Judgment appropriate.    Vicie Mutters, PA-C   10/19/2018

## 2018-10-19 ENCOUNTER — Encounter: Payer: Self-pay | Admitting: Physician Assistant

## 2018-10-19 ENCOUNTER — Ambulatory Visit (INDEPENDENT_AMBULATORY_CARE_PROVIDER_SITE_OTHER): Payer: Medicare Other | Admitting: Physician Assistant

## 2018-10-19 VITALS — BP 124/76 | HR 76 | Temp 97.7°F | Ht 64.0 in | Wt 158.2 lb

## 2018-10-19 DIAGNOSIS — E785 Hyperlipidemia, unspecified: Secondary | ICD-10-CM | POA: Diagnosis not present

## 2018-10-19 DIAGNOSIS — I671 Cerebral aneurysm, nonruptured: Secondary | ICD-10-CM

## 2018-10-19 DIAGNOSIS — M8580 Other specified disorders of bone density and structure, unspecified site: Secondary | ICD-10-CM | POA: Diagnosis not present

## 2018-10-19 DIAGNOSIS — Z170 Estrogen receptor positive status [ER+]: Secondary | ICD-10-CM

## 2018-10-19 DIAGNOSIS — C50412 Malignant neoplasm of upper-outer quadrant of left female breast: Secondary | ICD-10-CM

## 2018-10-19 DIAGNOSIS — Z79899 Other long term (current) drug therapy: Secondary | ICD-10-CM

## 2018-10-19 DIAGNOSIS — I10 Essential (primary) hypertension: Secondary | ICD-10-CM | POA: Diagnosis not present

## 2018-10-19 DIAGNOSIS — Z8249 Family history of ischemic heart disease and other diseases of the circulatory system: Secondary | ICD-10-CM | POA: Diagnosis not present

## 2018-10-19 DIAGNOSIS — I728 Aneurysm of other specified arteries: Secondary | ICD-10-CM

## 2018-10-19 DIAGNOSIS — E559 Vitamin D deficiency, unspecified: Secondary | ICD-10-CM | POA: Diagnosis not present

## 2018-10-19 DIAGNOSIS — D649 Anemia, unspecified: Secondary | ICD-10-CM

## 2018-10-19 DIAGNOSIS — J309 Allergic rhinitis, unspecified: Secondary | ICD-10-CM

## 2018-10-19 DIAGNOSIS — Z1509 Genetic susceptibility to other malignant neoplasm: Secondary | ICD-10-CM

## 2018-10-19 DIAGNOSIS — R7303 Prediabetes: Secondary | ICD-10-CM

## 2018-10-19 DIAGNOSIS — Z1501 Genetic susceptibility to malignant neoplasm of breast: Secondary | ICD-10-CM

## 2018-10-19 DIAGNOSIS — N2889 Other specified disorders of kidney and ureter: Secondary | ICD-10-CM | POA: Diagnosis not present

## 2018-10-19 NOTE — Patient Instructions (Signed)
Would suggest sending you to a lipid clinic for familial hypercholesterolemia.   Your LDL could improve, ideally we want it under a 100.  Your LDL is the bad cholesterol that can lead to heart attack and stroke. To lower your number you can decrease your fatty foods, red meat, cheese, milk and increase fiber like whole grains and veggies. You can also add a fiber supplement like Citracel or Benefiber, these do not cause gas and bloating and are safe to use. Especially if you have a strong family history of heart disease or stroke or you have evidence of plaque on any imaging like a chest xray, we may discuss at your next office visit putting you on a medication to get your number below 100.    Familial Hypercholesterolemia Familial hypercholesterolemia (FH) is a genetic disorder that causes a very high level of LDL (low-density lipoprotein) cholesterol. Cholesterol is a waxy, fat-like substance that your body needs to build cells. Your body makes all the cholesterol it needs in the liver and removes extra (excess) cholesterol from the blood as needed. Excess cholesterol comes from food that you eat. In people who have FH, the body is not able to remove LDL cholesterol from the blood as it should. A high level of LDL cholesterol puts you at higher risk for narrowing and hardening of your arteries (atherosclerosis) at an early age. This raises your risk for heart disease and stroke. What are the causes? FH is passed from parent to child (inherited). FH is caused by an inherited gene defect (genetic mutation) that makes it hard for the liver to remove LDL cholesterol from the blood. The gene may be inherited from one parent or both parents. What increases the risk? You may be at higher risk for FH if:  You have a family history of the condition. If both parents carry the genetic mutation, their children are at higher risk for a more severe form of FH, with symptoms that start at an earlier age. What are  the signs or symptoms? You may have a high level of LDL cholesterol before you develop symptoms. Symptoms of FH may include:  Cholesterol nodules (xanthomas) on the cords of tissue that connect muscles to bones (tendons). Xanthomas often form on the long tendon at the back of the ankle (Achilles tendon) or on the tendons on the back of the hands.  Cholesterol deposits (xanthelasmas) under the skin of the eyelids.  A gray or blue ring around the white part of the eye (corneal arcus). Complications of FH can occur due to atherosclerosis. Atherosclerosis may cause damage to an area of the body that is not getting enough blood. Complications of FH may include:  Chest pain (angina) and shortness of breath due to narrowed or blocked arteries in the heart (coronary artery disease).  Pain and cramping in the back of the lower legs (calves) when walking (claudication).  Interruption in blood flow to the brain (stroke). This may cause: ? Loss of balance. ? Vision loss. ? Sudden weakness or numbness on one side of the body. How is this diagnosed? This condition may be diagnosed based on:  Your symptoms.  Your medical history, including any family history of FH or early coronary heart disease.  A physical exam.  A blood test to check for the genetic mutation that causes FH. Your family members may also be tested. How is this treated? There is no cure for FH, but treatment can lower LDL cholesterol levels and lower your  risk for heart attack or stroke. Treatment should be started as soon as you are diagnosed. Treatment may include:  A type of medicine that lowers your cholesterol (statin). If statins do not help, your health care provider may try other kinds of cholesterol-lowering medicines. The exact combination of medicines depends on the severity of your symptoms.  A procedure to filter LDL from your blood (apheresis). You may need this treatment if you have a severe form of FH.  Making  lifestyle changes that are healthy for your heart, such as lowering the amount of fat and cholesterol in your diet. Follow these instructions at home: Lifestyle   Lose weight, if directed by your health care provider.  Follow instructions from your health care provider about eating a healthy diet. Your health care provider may recommend: ? Working with a diet and nutrition specialist (dietitian), who can help you make a healthy eating plan and help you maintain a healthy weight. ? Eating less fat and cholesterol. Avoid fatty meats, fried foods, and whole-fat dairy. ? Eating more vegetables, fruits, and whole grains. ? Limiting your intake of alcohol.  Be physically active. Ask your health care provider what type of exercise is best for you.  Do not use any products that contain nicotine or tobacco, such as cigarettes and e-cigarettes. If you need help quitting, ask your health care provider.  Work with your health care provider to manage any other conditions you have, such as high blood pressure (hypertension) or diabetes. These conditions affect your heart. General instructions  Take over-the-counter and prescription medicines only as told by your health care provider.  Keep all follow-up visits as told by your health care provider. This is important. Contact a health care provider if:  You have pain or cramps in your calf when you walk. Get help right away if:   You have sudden, unexplained discomfort in your chest, arms, back, neck, jaw, or upper body.  You have trouble breathing.  You have a sudden, severe headache with no known cause.  You have any symptoms of a stroke. "BE FAST" is an easy way to remember the main warning signs of a stroke: ? B - Balance. Signs are dizziness, sudden trouble walking, or loss of balance. ? E - Eyes. Signs are trouble seeing or a sudden change in vision. ? F - Face. Signs are sudden weakness or numbness of the face, or the face or eyelid  drooping on one side. ? A - Arms. Signs are weakness or numbness in an arm. This happens suddenly and usually on one side of the body. ? S - Speech. Signs are sudden trouble speaking, slurred speech, or trouble understanding what people say. ? T - Time. Time to call emergency services. Write down what time symptoms started. These symptoms may represent a serious problem that is an emergency. Do not wait to see if the symptoms will go away. Get medical help right away. Call your local emergency services (911 in the [place] Do not drive yourself to the hospital. Summary  Familial hypercholesterolemia (FH) is a genetic disorder that causes a very high level of LDL (low-density lipoprotein) cholesterol.  FH increases your risk for coronary heart disease and stroke at an early age.  Treatment for FH should be started as soon as you are diagnosed with the condition. Treatment is aimed at lowering your risk for complications.  Follow instructions from your health care provider about eating a healthy diet. Your health care provider may recommend  eating less fat and cholesterol. This information is not intended to replace advice given to you by your health care provider. Make sure you discuss any questions you have with your health care provider. Document Released: 01/29/2017 Document Revised: 01/29/2017 Document Reviewed: 01/29/2017 Elsevier Interactive Patient Education  2019 [employer].

## 2018-10-20 LAB — IRON, TOTAL/TOTAL IRON BINDING CAP
%SAT: 18 % (calc) (ref 16–45)
Iron: 55 ug/dL (ref 45–160)
TIBC: 303 mcg/dL (calc) (ref 250–450)

## 2018-10-20 LAB — ANGIOTENSIN CONVERTING ENZYME: Angiotensin-Converting Enzyme: <1 U/L — ABNORMAL LOW (ref 9–67)

## 2018-10-20 LAB — LIPID PANEL
Cholesterol: 254 mg/dL — ABNORMAL HIGH (ref <200)
HDL: 72 mg/dL (ref >=50)
LDL Cholesterol (Calc): 164 mg/dL (calc) — ABNORMAL HIGH
Non-HDL Cholesterol (Calc): 182 mg/dL (calc) — ABNORMAL HIGH (ref <130)
Total CHOL/HDL Ratio: 3.5 (calc) (ref <5.0)
Triglycerides: 76 mg/dL (ref <150)

## 2018-10-20 LAB — CBC WITH DIFFERENTIAL/PLATELET
Absolute Monocytes: 400 cells/uL (ref 200–950)
BASOS PCT: 1.6 %
Basophils Absolute: 93 cells/uL (ref 0–200)
Eosinophils Absolute: 180 cells/uL (ref 15–500)
Eosinophils Relative: 3.1 %
HCT: 37.6 % (ref 35.0–45.0)
Hemoglobin: 13 g/dL (ref 11.7–15.5)
LYMPHS ABS: 1143 {cells}/uL (ref 850–3900)
MCH: 31.3 pg (ref 27.0–33.0)
MCHC: 34.6 g/dL (ref 32.0–36.0)
MCV: 90.4 fL (ref 80.0–100.0)
MPV: 10.6 fL (ref 7.5–12.5)
Monocytes Relative: 6.9 %
Neutro Abs: 3985 cells/uL (ref 1500–7800)
Neutrophils Relative %: 68.7 %
Platelets: 270 10*3/uL (ref 140–400)
RBC: 4.16 10*6/uL (ref 3.80–5.10)
RDW: 12.3 % (ref 11.0–15.0)
Total Lymphocyte: 19.7 %
WBC: 5.8 10*3/uL (ref 3.8–10.8)

## 2018-10-20 LAB — HEMOGLOBIN A1C
Hgb A1c MFr Bld: 5.6 % of total Hgb (ref <5.7)
Mean Plasma Glucose: 114 (calc)
eAG (mmol/L): 6.3 (calc)

## 2018-10-20 LAB — COMPLETE METABOLIC PANEL WITH GFR
AG Ratio: 1.8 (calc) (ref 1.0–2.5)
ALBUMIN MSPROF: 4.6 g/dL (ref 3.6–5.1)
ALT: 22 U/L (ref 6–29)
AST: 24 U/L (ref 10–35)
Alkaline phosphatase (APISO): 76 U/L (ref 37–153)
BUN: 12 mg/dL (ref 7–25)
CO2: 27 mmol/L (ref 20–32)
Calcium: 10.1 mg/dL (ref 8.6–10.4)
Chloride: 96 mmol/L — ABNORMAL LOW (ref 98–110)
Creat: 0.62 mg/dL (ref 0.50–0.99)
GFR, Est African American: 107 mL/min/{1.73_m2} (ref >=60)
GFR, Est Non African American: 92 mL/min/{1.73_m2} (ref >=60)
GLOBULIN: 2.6 g/dL (ref 1.9–3.7)
Glucose, Bld: 85 mg/dL (ref 65–99)
Potassium: 3.9 mmol/L (ref 3.5–5.3)
SODIUM: 133 mmol/L — AB (ref 135–146)
TOTAL PROTEIN: 7.2 g/dL (ref 6.1–8.1)
Total Bilirubin: 0.5 mg/dL (ref 0.2–1.2)

## 2018-10-20 LAB — URINALYSIS, ROUTINE W REFLEX MICROSCOPIC
Bilirubin Urine: NEGATIVE
Glucose, UA: NEGATIVE
Hgb urine dipstick: NEGATIVE
Ketones, ur: NEGATIVE
Leukocytes,Ua: NEGATIVE
Nitrite: NEGATIVE
Protein, ur: NEGATIVE
Specific Gravity, Urine: 1.01 (ref 1.001–1.03)
pH: 8 (ref 5.0–8.0)

## 2018-10-20 LAB — MICROALBUMIN / CREATININE URINE RATIO
Creatinine, Urine: 40 mg/dL (ref 20–275)
MICROALB UR: 0.2 mg/dL
Microalb Creat Ratio: 5 mcg/mg creat (ref <30)

## 2018-10-20 LAB — ANCA SCREEN W REFLEX TITER: ANCA Screen: NEGATIVE

## 2018-10-20 LAB — VITAMIN D 25 HYDROXY (VIT D DEFICIENCY, FRACTURES): Vit D, 25-Hydroxy: 41 ng/mL (ref 30–100)

## 2018-10-20 LAB — TSH: TSH: 1.09 mIU/L (ref 0.40–4.50)

## 2018-10-20 LAB — MAGNESIUM: Magnesium: 1.7 mg/dL (ref 1.5–2.5)

## 2018-10-20 LAB — RNP ANTIBODY: Ribonucleic Protein(ENA) Antibody, IgG: <1 AI

## 2018-10-20 IMAGING — CT CT ABD-PELV W/O CM
1 of 2 series · 15 of 32 positions shown, 19 images · non-contrast
Comparison: 09/18/2015

CLINICAL DATA: Left lower quadrant pain

EXAM:
CT ABDOMEN AND PELVIS WITHOUT CONTRAST
TECHNIQUE: Multidetector CT imaging of the abdomen and pelvis was performed
following the standard protocol without IV contrast.

[Series 2: routine abdomen/pelvis with · axial · 0.93mm/px · z∈[+562,+972]mm · 15 of 90 slices shown, 19 images]
[im 4/90  soft-tissue]
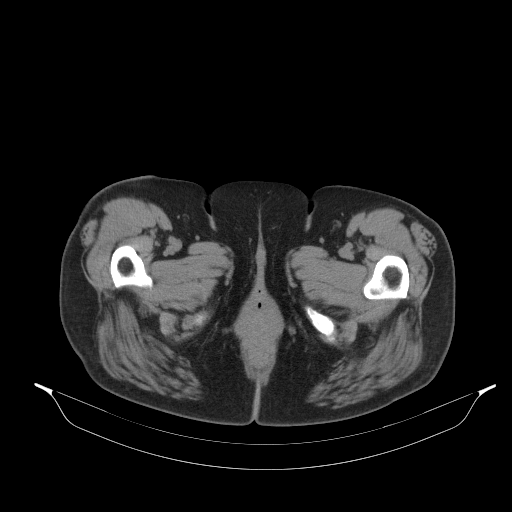
[im 4/90  bone]
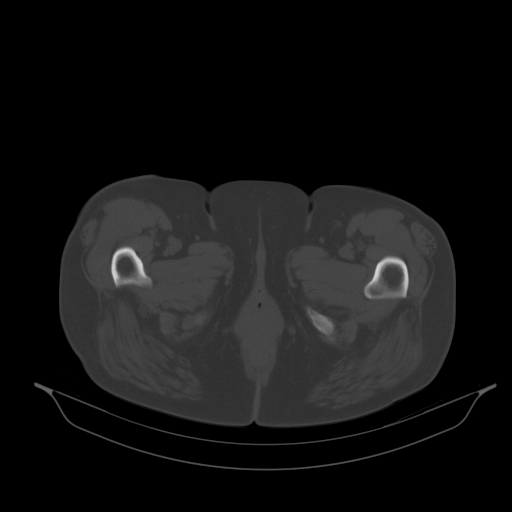
[im 11/90  soft-tissue]
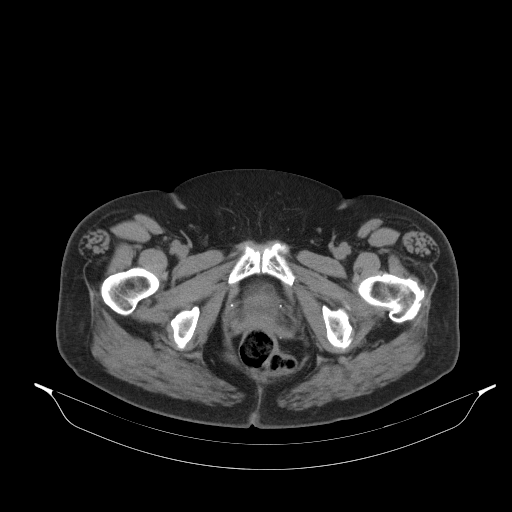
[im 18/90  soft-tissue]
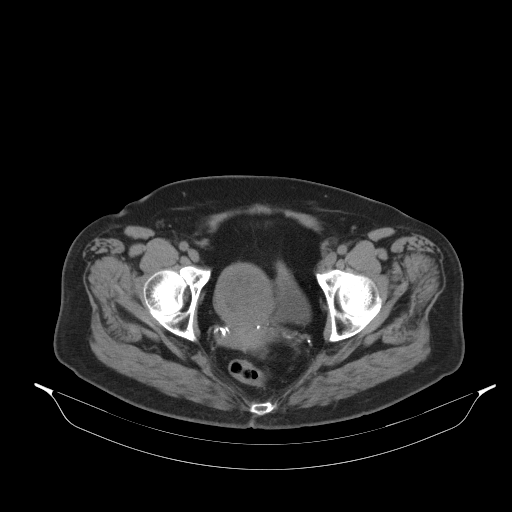
[im 25/90  soft-tissue]
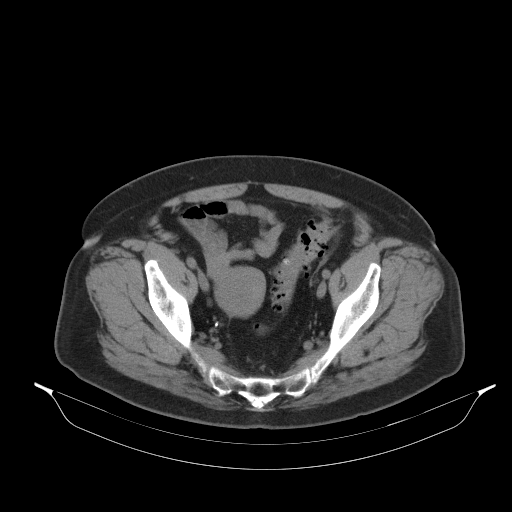
[im 33/90  soft-tissue]
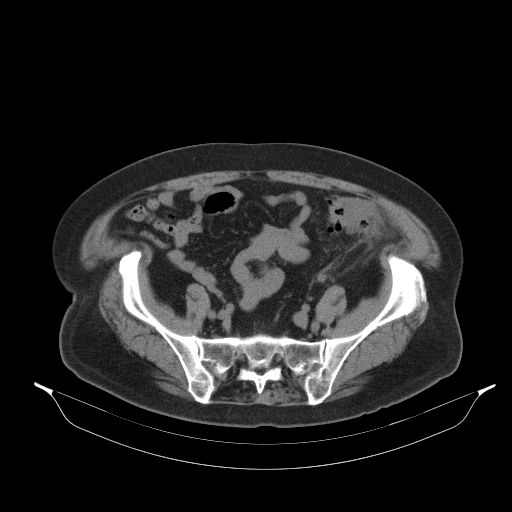
[im 40/90  soft-tissue]
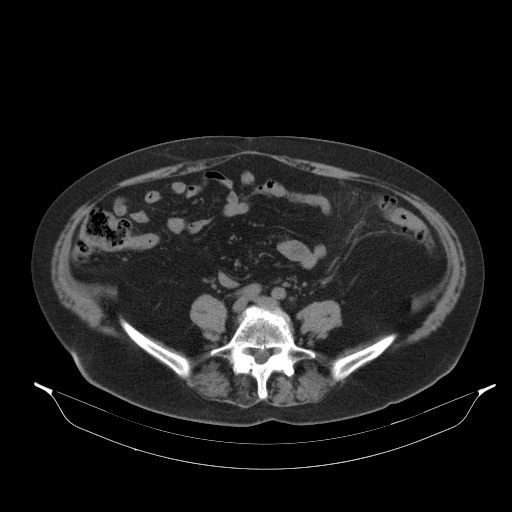
[im 47/90  soft-tissue]
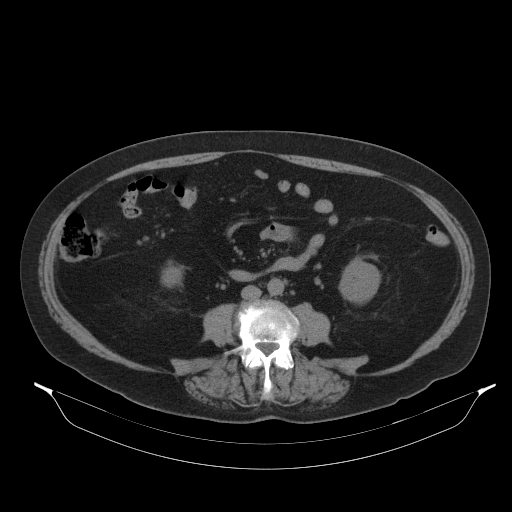
[im 50/90  soft-tissue]
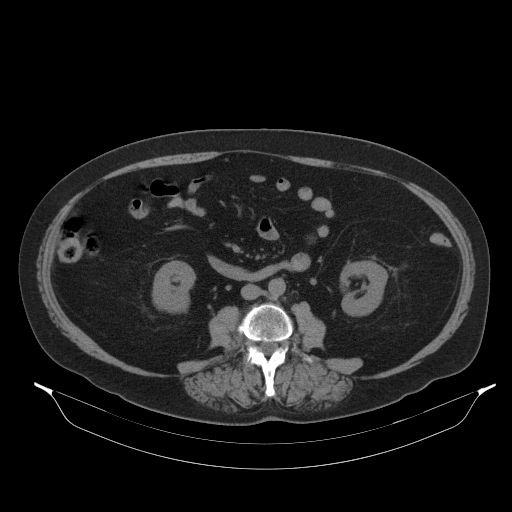
[im 57/90  soft-tissue]
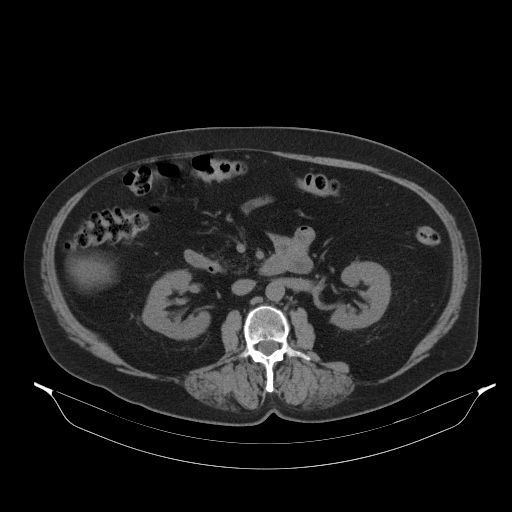
[im 57/90  bone]
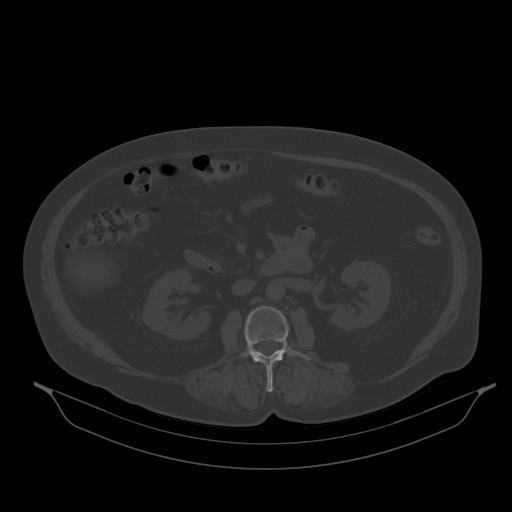
[im 65/90  soft-tissue]
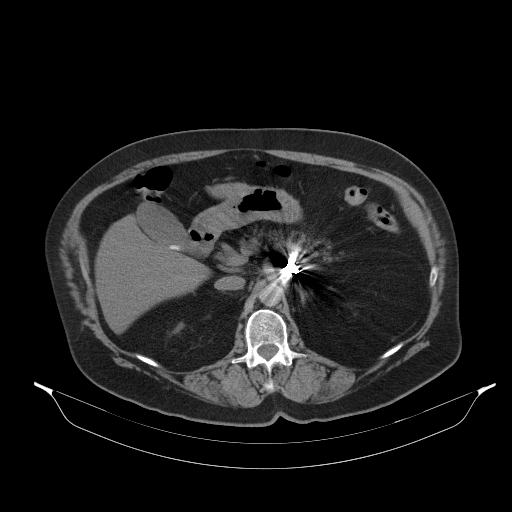
[im 72/90  soft-tissue]
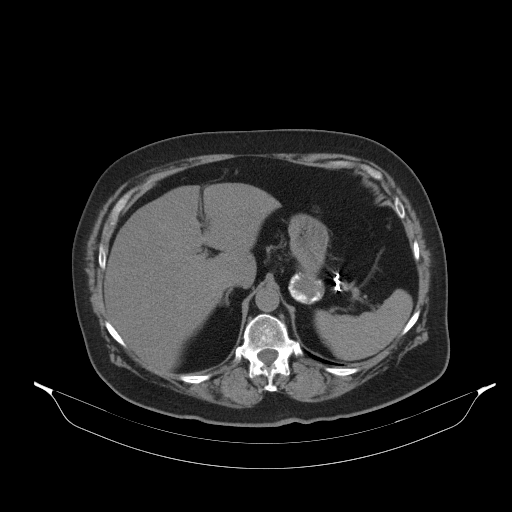
[im 75/90  lung]
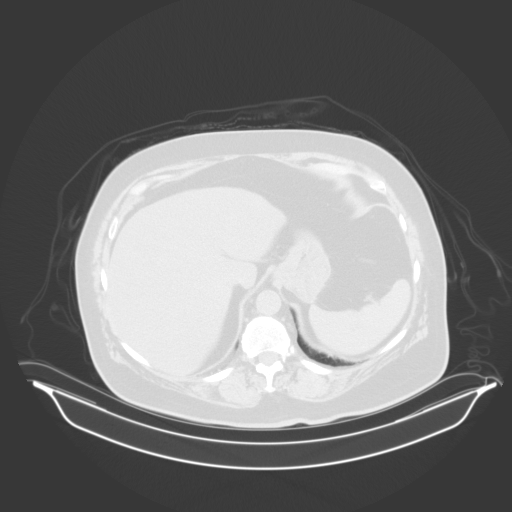
[im 79/90  soft-tissue]
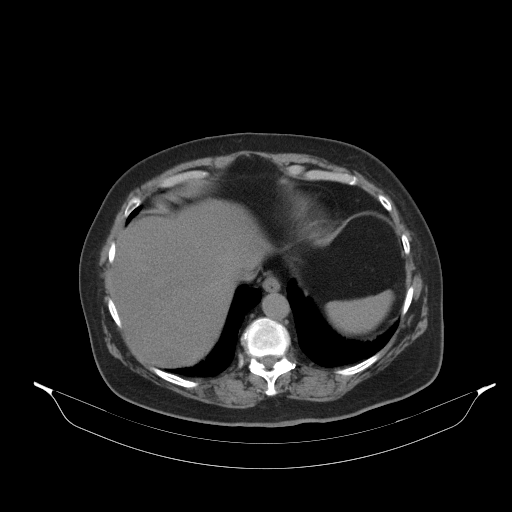
[im 79/90  lung]
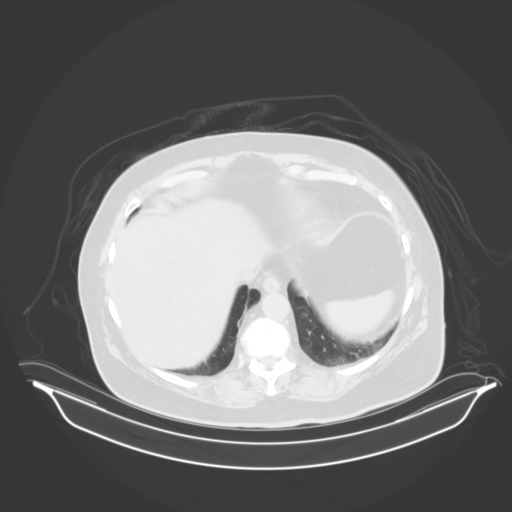
[im 82/90  lung]
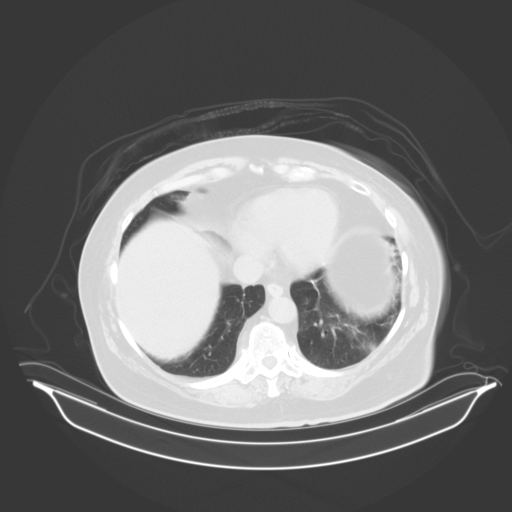
[im 86/90  soft-tissue]
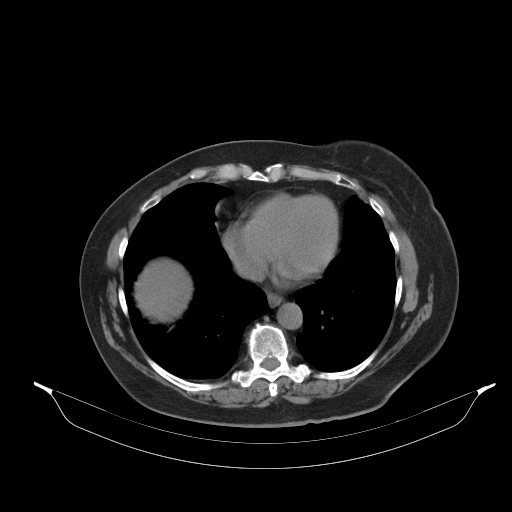
[im 86/90  lung]
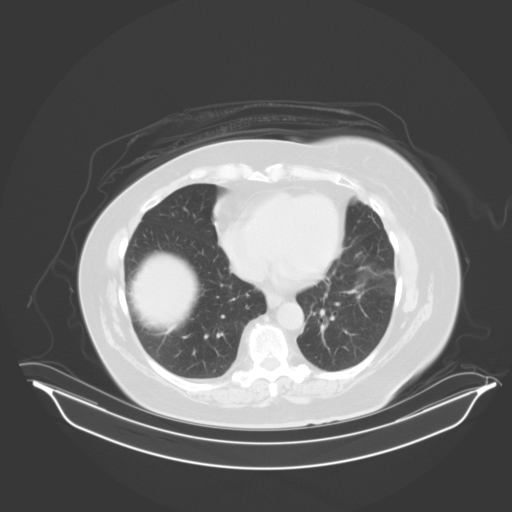

[15 of 32 positions shown; findings below may reference images not displayed]

FINDINGS: Lower chest:  Negative

Hepatobiliary: Patchy steatosis in the liver.Cholelithiasis. No
evidence of inflammation or obstruction.

Pancreas: Generalized fatty atrophy.

Spleen: Unremarkable.

Adrenals/Urinary Tract: Negative adrenals. No hydronephrosis or
stone. 12 mm high-density in the interpolar left kidney without
evident growth from 0273 but there has been growth from a 2624
abdominal CT with contrast. Unremarkable bladder.

Stomach/Bowel: Numerous colonic diverticula mainly in the left
colon. There is inflammation around a thickened diverticulum at the
descending sigmoid junction. No detected abscess. No
pneumoperitoneum. Negative for obstruction or appendicitis.

Vascular/Lymphatic: Coil embolization of a peripherally calcified
splenic artery aneurysm. No acute finding. No mass or adenopathy.

Reproductive:Essentially stable appearance of a 4 x 6 cm uterine
mass consistent with fibroid.

Other: No ascites or pneumoperitoneum.

Musculoskeletal: Disc and facet degeneration. No acute or aggressive
finding.
IMPRESSION: 1. Diverticulitis at the descending sigmoid junction without
pneumoperitoneum or abscess. Diverticula are clustered in the left
colon.
2. 12 mm hemorrhagic cyst or solid mass in the left kidney. There
has been mild growth since 2624. Could attend characterization with
ultrasound, but surveillance or renal MRI may be needed.
3. Hepatic steatosis.
4. Uterine fibroid.
5. Cholelithiasis.

## 2018-10-25 DIAGNOSIS — H04123 Dry eye syndrome of bilateral lacrimal glands: Secondary | ICD-10-CM | POA: Diagnosis not present

## 2018-10-25 DIAGNOSIS — H5203 Hypermetropia, bilateral: Secondary | ICD-10-CM | POA: Diagnosis not present

## 2018-10-25 DIAGNOSIS — D3131 Benign neoplasm of right choroid: Secondary | ICD-10-CM | POA: Diagnosis not present

## 2018-10-25 DIAGNOSIS — H25813 Combined forms of age-related cataract, bilateral: Secondary | ICD-10-CM | POA: Diagnosis not present

## 2018-11-26 ENCOUNTER — Other Ambulatory Visit: Payer: Medicare Other | Admitting: Internal Medicine

## 2018-11-26 DIAGNOSIS — K5792 Diverticulitis of intestine, part unspecified, without perforation or abscess without bleeding: Secondary | ICD-10-CM

## 2018-11-26 MED ORDER — METRONIDAZOLE 250 MG PO TABS: ORAL_TABLET | ORAL | 1 refills | Status: DC

## 2018-11-26 MED ORDER — CIPROFLOXACIN HCL 250 MG PO TABS: ORAL_TABLET | ORAL | 1 refills | Status: DC

## 2018-11-26 NOTE — Progress Notes (Signed)
THIS ENCOUNTER IS A VIRTUAL VISIT DUE TO COVID-19 - PATIENT WAS NOT SEEN IN THE OFFICE.  PATIENT HAS CONSENTED TO VIRTUAL VISIT / TELEMEDICINE VISIT   Virtual Visit via telephone Note  Friday  11/26/2018 3:40 pm  I connected with Jacqueline Jenkins on 11/26/18  by telephone.  I verified that I am speaking with the correct person using two identifiers.    I discussed the limitations of evaluation and management by telemedicine and the availability of in person appointments. The patient expressed understanding and agreed to proceed.  History of Present Illness:     This very nice 68 yo MWF HTN, HLD, Pre-Diabetes, Vitamin D Deficiency &  hx of Breast cancer  called relating a 24 hour hx/o abdominal bloating and point tenderness / soreness in her LLQ similar to last August when she was dx'd / treated for Diverticulitis. Reports she's on a liquid diet for the last 24 hours. Reports she & husband have been house confined for the last 2 weeks. Denies any exposures to Covid suspect cases. Denies any respiratory sx's, cough, SOB, fever, chills, sweats, rash, N/V, diarrhea/constipation or rectal bleeding.  Medications  .  benazepril-hydrochlorthiazide (LOTENSIN HCT) 20-12.5 MG tablet, TAKE 1 TABLET BY MOUTH EVERY DAY .  naproxen sodium (ANAPROX) 220 MG tablet, Take 220 mg by mouth daily as needed (pain). Marland Kitchen  anastrozole (ARIMIDEX) 1 MG tablet, Take 1 tablet (1 mg total) by mouth daily at 3 pm. .  Calcium Carb-Cholecalciferol (CALCIUM 1000 + D PO), Take 2 tablets by mouth daily. Gummies .  cholecalciferol (VITAMIN D) 1000 units tablet, Take 1,000 Units by mouth daily.  Allergies  Allergen Reactions  . Lescol [Fluvastatin Sodium] Other (See Comments)    Myalgia  . Lipitor [Atorvastatin] Other (See Comments)    Increased LFT's  . Vytorin [Ezetimibe-Simvastatin] Other (See Comments)    myalgia   Problem list She has Aneurysm of splenic artery ([place]); Allergic rhinitis; Hyperlipidemia; DDD (degenerative  disc disease); Anemia; Vitamin D deficiency; Prediabetes; Medication management; HTN (hypertension); Breast cancer of upper-outer quadrant of left female breast ([place]); Genetic testing; BRCA2 positive; Dural arteriovenous fistula; Osteopenia; FHx: heart disease; and Left kidney mass on their problem list.   Observations/Objective:  Patient sounds as though in no distress. Speech fluent & clear.  Assessment and Plan:  1. Diverticulitis  - ciprofloxacin (CIPRO) 250 MG tablet; Take 1 tablet 2 x /day with meals for Infection  Dispense: 20 tablet; Refill: 1 - metroNIDAZOLE (FLAGYL) 250 MG tablet; Take 1 tablet 3 x /day with meals for infection  Dispense: 30 tablet; Refill: 1   Follow Up Instructions:  - Diet instructions discussed in detail   I discussed the assessment and treatment plan with the patient. The patient was provided an opportunity to ask questions and all were answered. The patient agreed with the plan and demonstrated an understanding of the instructions.   The patient was advised to call back or seek an in-person evaluation if the symptoms worsen or if the condition fails to improve as anticipated.  I provided 12 minutes of non-face-to-face time during this encounter and over 20 minutes of systems review, chart review, counseling and critical decision making was performed  Kirtland Bouchard, MD

## 2018-12-21 ENCOUNTER — Encounter

## 2019-03-02 ENCOUNTER — Other Ambulatory Visit: Payer: Self-pay

## 2019-03-02 ENCOUNTER — Encounter: Payer: Self-pay | Admitting: Hematology and Oncology

## 2019-03-02 DIAGNOSIS — Z1231 Encounter for screening mammogram for malignant neoplasm of breast: Secondary | ICD-10-CM

## 2019-03-17 ENCOUNTER — Ambulatory Visit
Admission: RE | Admit: 2019-03-17 | Discharge: 2019-03-17 | Disposition: A | Discharge status: Home / Self Care | Payer: Medicare Other | Source: Ambulatory Visit | Attending: Hematology and Oncology | Admitting: Hematology and Oncology

## 2019-03-17 DIAGNOSIS — Z853 Personal history of malignant neoplasm of breast: Secondary | ICD-10-CM | POA: Diagnosis not present

## 2019-03-17 DIAGNOSIS — Z1231 Encounter for screening mammogram for malignant neoplasm of breast: Secondary | ICD-10-CM

## 2019-03-17 DIAGNOSIS — Z803 Family history of malignant neoplasm of breast: Secondary | ICD-10-CM | POA: Diagnosis not present

## 2019-03-17 DIAGNOSIS — Z923 Personal history of irradiation: Secondary | ICD-10-CM | POA: Diagnosis not present

## 2019-03-17 MED ORDER — GADOBUTROL 1 MMOL/ML IV SOLN
7.0000 mL | Freq: Once | INTRAVENOUS | Status: AC | PRN
  Administered 2019-03-17: 7 mL via INTRAVENOUS

## 2019-03-25 ENCOUNTER — Telehealth: Payer: Self-pay | Admitting: Hematology and Oncology

## 2019-03-25 NOTE — Telephone Encounter (Signed)
I talk with patient regarding visit  °

## 2019-03-25 NOTE — Assessment & Plan Note (Signed)
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 Patient does not need axillary lymph node dissection based on current NCCN guidelines and ACOSOG Z 11 clinical trial; luminal type B and high-risk phenotype. Risk of relapse without chemotherapy 24% versus 12% with chemotherapy.  Treatment summary: 1. Dose dense Adriamycin and Cytoxan 4 followed by Abraxane weekly 12 started 10/11/2015 completed 02/21/2016 2. Adjuvant radiation therapy started 03/27/2016 completed 04/23/2016 3. Adjuvant antiestrogen therapy started 05/22/2016 ----------------------------------------------------------------------------------------------------------------- Current treatment: Antiestrogen therapy with anastrozole 1 mg dailyto start 06/01/2016 BRCA2 mutation: Oophorectomy 05/16/2016: No malignancy  Anastrozole Toxicities: Able to tolerate anastrozole fairly well. Denies any hot flashes or myalgias. The initial hot flashes have improved.  Surveillance of breast cancer: 1. Mammograms  Jan 2020: Benign, breast density category B 2. MRI breast 03/17/2019. (Because of BRCA2 mutation): No evidence of recurrent breast cancer  Return to clinic 1 year for follow-up

## 2019-03-30 ENCOUNTER — Telehealth: Payer: Self-pay | Admitting: Hematology and Oncology

## 2019-03-30 NOTE — Progress Notes (Signed)
HEMATOLOGY-ONCOLOGY TELEPHONE VISIT PROGRESS NOTE  I connected with Jacqueline Jenkins on 03/31/2019 at  2:00 PM EDT by telephone and verified that I am speaking with the correct person using two identifiers.  I discussed the limitations, risks, security and privacy concerns of performing an evaluation and management service by telephone and the availability of in person appointments.  I also discussed with the patient that there may be a patient responsible charge related to this service. The patient expressed understanding and agreed to proceed.   History of Present Illness: Jacqueline Jenkins is a 68 y.o. female with above-mentioned history of BRCA2 mutation positive left breast cancer who underwent left lumpectomy, adjuvant chemotherapy, radiation, and who is currently on antiestrogen therapy with anastrozole. I last saw her a year ago. Mammogram on 09/06/18 showed no evidence of malignancy. Breast MRI on 03/17/19 showed no evidence of malignancy bilaterally. She presents over the phone today for follow-up.   Oncology History  Breast cancer of upper-outer quadrant of left female breast ([place])  08/03/2015 Mammogram   Left Breast: 6 mm mass @ 1 o clock   08/09/2015 Initial Diagnosis   Left breast 1:00 position: Invasive ductal carcinoma grade 1-2, ER 100%, PR 10%, HER-2 negative ratio 1.79, Ki-67 15%, T1 BN 0 stage I a clinical stage   08/29/2015 Surgery   Left lumpectomy ([place]): invasive ductal carcinoma 1.2 cm with LVID, with DCIS intermediate grade, 1 sentinel node positive, ER 100%, PR 10%, HER-2 negative ratio 1.79, Ki-67 15% , margins negative, Mammaprint high risk, luminal B, T1cN1 stage II a   08/29/2015 Procedure   Mammaprint: High-risk, Luminal type   09/07/2015 Surgery   Left breast re-excision ([place]): ADH, no malignancy, negative margins.    09/12/2015 Procedure   BRCA2 mutation "[id number]." Also VUS on CHEK2.  Otherwise negative. Genes analyzed:  ATM, BARD1, BRCA1, BRCA2,  BRIP1, CDH1, CHEK2, FANCC, MLH1, MSH2, MSH6, NBN, PALB2, PMS2, PTEN, RAD51C, RAD51D, TP53, and XRCC2; deletion/duplication analysis (without sequencing) for EPCAM.   10/11/2015 - 02/21/2016 Chemotherapy   Adjuvant chemotherapy with dose dense Adriamycin and Cytoxan 4 followed by Abraxane weekly 12   03/27/2016 - 04/23/2016 Radiation Therapy   Adjuvant radiation therapy Lisbeth Renshaw). Left breast: 42.5 Gy in 17 fractions. Left breast boost: 7.5 Gy in 3 fractions.    05/16/2016 Surgery   Bilateral salpingo-oophorectomies: No cancer   06/05/2016 -  Anti-estrogen oral therapy   Anastrozole 1 mg by mouth daily     Observations/Objective:  No evidence of recurrence   Assessment Plan:  Breast cancer of upper-outer quadrant of left female breast ([place]) Left lumpectomy 08/29/2015: Invasive ductal carcinoma 1.2 cm with LVID, with DCIS intermediate grade, 1 sentinel node positive, ER 100%, PR 10%, HER-2 negative ratio 1.79, Ki-67 15% , margins negative, Mammaprint high risk, luminal B, T1cN1 stage II a. Patient does not need axillary lymph node dissection based on current NCCN guidelines and ACOSOG Z 11 clinical trial; luminal type B and high-risk phenotype. Risk of relapse without chemotherapy 24% versus 12% with chemotherapy.  Treatment summary: 1. Dose dense Adriamycin and Cytoxan 4 followed by Abraxane weekly 12 started 10/11/2015 completed 02/21/2016 2. Adjuvant radiation therapy started 03/27/2016 completed 04/23/2016 3. Adjuvant antiestrogen therapy started 05/22/2016 ----------------------------------------------------------------------------------------------------------------- Current treatment: Antiestrogen therapy with anastrozole 1 mg dailyto start 06/01/2016 BRCA2 mutation: Oophorectomy 05/16/2016: No malignancy  Anastrozole Toxicities: Able to tolerate anastrozole fairly well. Denies any hot flashes or myalgias. The initial hot flashes have improved.  Surveillance of breast cancer:  1. Mammograms  Jan  2020: Benign, breast density category B 2. MRI breast 03/17/2019. (Because of BRCA2 mutation): No evidence of recurrent breast cancer  Off Fosamax due to getting dental implant. Return to clinic 1 year for follow-up  I discussed the assessment and treatment plan with the patient. The patient was provided an opportunity to ask questions and all were answered. The patient agreed with the plan and demonstrated an understanding of the instructions. The patient was advised to call back or seek an in-person evaluation if the symptoms worsen or if the condition fails to improve as anticipated.   I provided 15 minutes of non-face-to-face time during this encounter.   Rulon Eisenmenger, MD 03/31/2019    I, Molly Dorshimer, am acting as scribe for Nicholas Lose, MD.  I have reviewed the above documentation for accuracy and completeness, and I agree with the above.

## 2019-03-31 ENCOUNTER — Inpatient Hospital Stay: Payer: Medicare Other | Attending: Hematology and Oncology | Admitting: Hematology and Oncology

## 2019-03-31 DIAGNOSIS — Z1231 Encounter for screening mammogram for malignant neoplasm of breast: Secondary | ICD-10-CM | POA: Diagnosis not present

## 2019-03-31 DIAGNOSIS — C50412 Malignant neoplasm of upper-outer quadrant of left female breast: Secondary | ICD-10-CM | POA: Diagnosis not present

## 2019-03-31 DIAGNOSIS — Z170 Estrogen receptor positive status [ER+]: Secondary | ICD-10-CM

## 2019-03-31 DIAGNOSIS — Z780 Asymptomatic menopausal state: Secondary | ICD-10-CM | POA: Diagnosis not present

## 2019-03-31 MED ORDER — ANASTROZOLE 1 MG PO TABS: 1.0000 mg | ORAL_TABLET | Freq: Every day | ORAL | 3 refills | Status: DC

## 2019-04-01 ENCOUNTER — Telehealth: Payer: Self-pay | Admitting: Hematology and Oncology

## 2019-04-01 NOTE — Telephone Encounter (Signed)
I talk with patient regarding schedule  

## 2019-04-26 DIAGNOSIS — Z124 Encounter for screening for malignant neoplasm of cervix: Secondary | ICD-10-CM | POA: Diagnosis not present

## 2019-04-26 DIAGNOSIS — N951 Menopausal and female climacteric states: Secondary | ICD-10-CM | POA: Diagnosis not present

## 2019-04-26 DIAGNOSIS — Z6826 Body mass index (BMI) 26.0-26.9, adult: Secondary | ICD-10-CM | POA: Diagnosis not present

## 2019-04-26 DIAGNOSIS — Z853 Personal history of malignant neoplasm of breast: Secondary | ICD-10-CM | POA: Diagnosis not present

## 2019-04-26 DIAGNOSIS — Z01419 Encounter for gynecological examination (general) (routine) without abnormal findings: Secondary | ICD-10-CM | POA: Diagnosis not present

## 2019-04-27 ENCOUNTER — Other Ambulatory Visit: Payer: Self-pay | Admitting: Hematology and Oncology

## 2019-04-27 ENCOUNTER — Other Ambulatory Visit: Payer: Self-pay

## 2019-04-27 ENCOUNTER — Encounter: Payer: Self-pay | Admitting: Internal Medicine

## 2019-04-27 ENCOUNTER — Ambulatory Visit (INDEPENDENT_AMBULATORY_CARE_PROVIDER_SITE_OTHER): Payer: Medicare Other | Admitting: Internal Medicine

## 2019-04-27 VITALS — BP 136/78 | HR 64 | Temp 97.0°F | Resp 16 | Ht 64.0 in | Wt 159.6 lb

## 2019-04-27 DIAGNOSIS — R7303 Prediabetes: Secondary | ICD-10-CM

## 2019-04-27 DIAGNOSIS — E559 Vitamin D deficiency, unspecified: Secondary | ICD-10-CM | POA: Diagnosis not present

## 2019-04-27 DIAGNOSIS — E782 Mixed hyperlipidemia: Secondary | ICD-10-CM

## 2019-04-27 DIAGNOSIS — R7309 Other abnormal glucose: Secondary | ICD-10-CM | POA: Diagnosis not present

## 2019-04-27 DIAGNOSIS — Z79899 Other long term (current) drug therapy: Secondary | ICD-10-CM | POA: Diagnosis not present

## 2019-04-27 DIAGNOSIS — Z853 Personal history of malignant neoplasm of breast: Secondary | ICD-10-CM

## 2019-04-27 DIAGNOSIS — M26621 Arthralgia of right temporomandibular joint: Secondary | ICD-10-CM

## 2019-04-27 DIAGNOSIS — I10 Essential (primary) hypertension: Secondary | ICD-10-CM | POA: Diagnosis not present

## 2019-04-27 MED ORDER — PREDNISONE 20 MG PO TABS: ORAL_TABLET | ORAL | 1 refills | Status: DC

## 2019-04-27 NOTE — Patient Instructions (Signed)

## 2019-04-27 NOTE — Progress Notes (Signed)
History of Present Illness:      This very nice 68 y.o. MWF presents for 3 month follow up with HTN, HLD, Pre-Diabetes and Vitamin D Deficiency. Today, she's also c/o pain/discomfort of the Rt TM jt.       Patient underwent Lt breast Lumpectomy (12, 2016) followed by Chemoradiation (Dr Lindi Adie) and then had BSO (Sept 2017) for a BRCA2 mutation +ER/+PR/HER2 Neg.      Patient is treated for HTN (1996) & BP has been controlled at home. Today's BP is at goal - 136/78. Patient has had no complaints of any cardiac type chest pain, palpitations, dyspnea / orthopnea / PND, dizziness, claudication, or dependent edema.      Patient is intolerant to Statins and her Hyperlipidemia is not controlled with diet & Zetia. Patient denies myalgias or other med SE's. Last Lipids were not at goal: Lab Results  Component Value Date   CHOL 254 (H) 10/19/2018   HDL 72 10/19/2018   LDLCALC 164 (H) 10/19/2018   TRIG 76 10/19/2018   CHOLHDL 3.5 10/19/2018       Also, the patient has history of PreDiabetes (A1c 6.2% / 2016 and 5.7% / 2016)  and has had no symptoms of reactive hypoglycemia, diabetic polys, paresthesias or visual blurring.  Last A1c was Normal & at goal: Lab Results  Component Value Date   HGBA1C 5.6 10/19/2018       Further, the patient also has history of Vitamin D Deficiency ("29" / 2016) and supplements vitamin D without any suspected side-effects. Last vitamin D was low (goal 70-100): Lab Results  Component Value Date   VD25OH 41 10/19/2018   Current Outpatient Medications on File Prior to Visit  Medication Sig  . anastrozole (ARIMIDEX) 1 MG tablet Take 1 tablet (1 mg total) by mouth daily at 3 pm.  . benazepril-hydrochlorthiazide (LOTENSIN HCT) 20-12.5 MG tablet TAKE 1 TABLET BY MOUTH EVERY DAY  . Calcium Carb-Cholecalciferol (CALCIUM 1000 + D PO) Take 2 tablets by mouth daily. Gummies  . naproxen sodium (ANAPROX) 220 MG tablet Take 220 mg by mouth daily as needed (pain).  Marland Kitchen VITAMIN  D PO Take 5,000 Units by mouth. Takes 1 capsule every other day.   No current facility-administered medications on file prior to visit.    Allergies  Allergen Reactions  . Lescol [Fluvastatin Sodium] Other (See Comments)    Myalgia  . Lipitor [Atorvastatin] Other (See Comments)    Increased LFT's  . Vytorin [Ezetimibe-Simvastatin] Other (See Comments)    myalgia   PMHx:   Past Medical History:  Diagnosis Date  . Allergic rhinitis, cause unspecified   . Anemia    history of anemia while teenager  . BRCA2 positive   . Breast cancer ([place]) 2016   Left Breast Cancer  . Breast cancer of upper-outer quadrant of left female breast ([place])    chemo complete 01/2016, radiation 04/2016  . DDD (degenerative disc disease)   . Family history of breast cancer 08/30/2015   Dx. In maternal grandmother in her 14s-40; dx. In maternal first cousin in her 15s-60s   . Family history of colon cancer 08/30/2015   Dx. In maternal uncle in his 15s   . Hyperlipidemia   . Hypertension   . Neuropathy   . Osteopenia 12/2011   Spine T -0.4, Femur -2.1  . Personal history of chemotherapy 2016   Left Breast Cancer  . Personal history of radiation therapy 2016   Left Breast  Cancer  . PONV (postoperative nausea and vomiting)   . Splenic artery aneurysm ([place]) 2013   COILS DONE  . Vitamin D deficiency    Immunization History  Administered Date(s) Administered  . DT 01/22/2015  . Influenza Split 06/08/2014, 06/02/2016  . Influenza, High Dose Seasonal PF 06/08/2014, 05/27/2017, 06/01/2018  . Influenza-Unspecified 06/14/2013, 06/04/2015, 05/27/2017  . Meningococcal Conjugate 09/02/2011  . PPD Test 01/18/2014  . Pneumococcal Conjugate-13 12/31/2011, 06/17/2017  . Pneumococcal Polysaccharide-23 09/02/2011  . Pneumococcal-Unspecified 09/22/1995, 09/02/2011  . Td 11/27/1994, 04/29/2004, 01/22/2015   Past Surgical History:  Procedure Laterality Date  . BREAST LUMPECTOMY Left 2016  . BREAST LUMPECTOMY WITH  RADIOACTIVE SEED AND SENTINEL LYMPH NODE BIOPSY Left 08/29/2015   Procedure: LEFT BREAST LUMPECTOMY WITH RADIOACTIVE SEED WITH AXILLARY SENTINEL LYMPH NODE BIOPSY;  Surgeon: Excell Seltzer, MD;  Location: [facility];  Service: General;  Laterality: Left;  . [place]  . COLONOSCOPY  Jan. 7, 2014  . IR ANGIO EXTERNAL CAROTID SEL EXT CAROTID BILAT MOD SED  02/03/2017  . IR ANGIO EXTERNAL CAROTID SEL EXT CAROTID UNI R MOD SED  03/05/2017  . IR ANGIO INTRA EXTRACRAN SEL COM CAROTID INNOMINATE UNI R MOD SED  03/05/2017  . IR ANGIO INTRA EXTRACRAN SEL INTERNAL CAROTID BILAT MOD SED  02/03/2017  . IR ANGIO VERTEBRAL SEL VERTEBRAL BILAT MOD SED  02/03/2017  . IR ANGIOGRAM FOLLOW UP STUDY  03/05/2017  . IR NEURO EACH ADD'L AFTER BASIC UNI RIGHT (MS)  02/03/2017  . IR NEURO EACH ADD'L AFTER BASIC UNI RIGHT (MS)  03/05/2017  . IR TRANSCATH/EMBOLIZ  03/05/2017  . LAPAROSCOPIC BILATERAL SALPINGO OOPHERECTOMY Bilateral 05/16/2016   Procedure: LAPAROSCOPIC BILATERAL SALPINGO OOPHORECTOMY;  Surgeon: Paula Compton, MD;  Location: [facility];  Service: Gynecology;  Laterality: Bilateral;  . [place] SURGERY  2001, 2010  . PORTACATH PLACEMENT Bilateral 10/09/2015   Procedure: INSERTION PORT-A-CATH;  Surgeon: Excell Seltzer, MD;  Location: [facility];  Service: General;  Laterality: Bilateral;  . Portacath removal    . RADIOLOGY WITH ANESTHESIA N/A 03/05/2017   Procedure: ARTERIOGRAM ONYX EMBOLIZATION OF FISTULA;  Surgeon: Consuella Lose, MD;  Location: [facility];  Service: Radiology;  Laterality: N/A;  . RE-EXCISION OF BREAST LUMPECTOMY Left 09/07/2015   Procedure: RE-EXCISION OF LEFT BREAST LUMPECTOMY;  Surgeon: Excell Seltzer, MD;  Location: [facility];  Service: General;  Laterality: Left;  . SPINE SURGERY  1998   cervical diskectomy  . SPINE SURGERY  2001, 2010   Lumbar diskectomy  . splenic aneurysm  02/10/2012   Aneurysm of splenic artery  . TONSILLECTOMY    . VISCERAL  ANGIOGRAM N/A 02/10/2012   Procedure: VISCERAL ANGIOGRAM;  Surgeon: Serafina Mitchell, MD;  [facility];  Service: Cardiovascular;  Laterality: N/A;   FHx:    Reviewed / unchanged  SHx:    Reviewed / unchanged   Systems Review:  Constitutional: Denies fever, chills, wt changes, headaches, insomnia, fatigue, night sweats, change in appetite. Eyes: Denies redness, blurred vision, diplopia, discharge, itchy, watery eyes.  ENT: Denies discharge, congestion, post nasal drip, epistaxis, sore throat, earache, hearing loss, dental pain, tinnitus, vertigo, sinus pain, snoring.  CV: Denies chest pain, palpitations, irregular heartbeat, syncope, dyspnea, diaphoresis, orthopnea, PND, claudication or edema. Respiratory: denies cough, dyspnea, DOE, pleurisy, hoarseness, laryngitis, wheezing.  Gastrointestinal: Denies dysphagia, odynophagia, heartburn, reflux, water brash, abdominal pain or cramps, nausea, vomiting, bloating, diarrhea, constipation, hematemesis, melena, hematochezia  or hemorrhoids. Genitourinary: Denies dysuria, frequency,  urgency, nocturia, hesitancy, discharge, hematuria or flank pain. Musculoskeletal: Denies arthralgias, myalgias, stiffness, jt. swelling, pain, limping or strain/sprain.  Skin: Denies pruritus, rash, hives, warts, acne, eczema or change in skin lesion(s). Neuro: No weakness, tremor, incoordination, spasms, paresthesia or pain. Psychiatric: Denies confusion, memory loss or sensory loss. Endo: Denies change in weight, skin or hair change.  Heme/Lymph: No excessive bleeding, bruising or enlarged lymph nodes.  Physical Exam  BP 136/78   Pulse 64   Temp (!) 97 F (36.1 C)   Resp 16   Ht _0  (1.626 m)   Wt 159 lb 9.6 oz (72.4 kg)   BMI 27.40 kg/m   Appears  well nourished, well groomed  and in no distress.  Eyes: PERRLA, EOMs, conjunctiva no swelling or erythema. Sinuses: No frontal/maxillary tenderness ENT/Mouth: EAC's clear, TM's nl w/o erythema,  bulging. Rt >> Lt TM jt tenderness. Nares clear w/o erythema, swelling, exudates. Oropharynx clear without erythema or exudates. Oral hygiene is good. Tongue normal, non obstructing. Hearing intact.  Neck: Supple. Thyroid not palpable. Car 2+/2+ without bruits, nodes or JVD. Chest: Respirations nl with BS clear & equal w/o rales, rhonchi, wheezing or stridor.  Cor: Heart sounds normal w/ regular rate and rhythm without sig. murmurs, gallops, clicks or rubs. Peripheral pulses normal and equal  without edema.  Abdomen: Soft & bowel sounds normal. Non-tender w/o guarding, rebound, hernias, masses or organomegaly.  Lymphatics: Unremarkable.  Musculoskeletal: Full ROM all peripheral extremities, joint stability, 5/5 strength and normal gait.  Skin: Warm, dry without exposed rashes, lesions or ecchymosis apparent.  Neuro: Cranial nerves intact, reflexes equal bilaterally. Sensory-motor testing grossly intact. Tendon reflexes grossly intact.  Pysch: Alert & oriented x 3.  Insight and judgement nl & appropriate. No ideations.  Assessment and Plan:  1. Essential hypertension  - Continue medication, monitor blood pressure at home.  - Continue DASH diet.  Reminder to go to the ER if any CP,  SOB, nausea, dizziness, severe HA, changes vision/speech.  - CBC with Differential/Platelet - COMPLETE METABOLIC PANEL WITH GFR - Magnesium - TSH  2. Hyperlipidemia, mixed  - Continue diet/meds, exercise,& lifestyle modifications.  - Continue monitor periodic cholesterol/liver & renal functions   - Lipid panel - TSH  3. Abnormal glucose  - Continue diet, exercise  - Lifestyle modifications.  - Monitor appropriate labs.  - Hemoglobin A1c - Insulin, random  4. Vitamin D deficiency  - Continue supplementation.  - VITAMIN D 25 Hydroxyl  5. Prediabetes  - Hemoglobin A1c - Insulin, random  6. Arthralgia of right temporomandibular joint  - predniSONE (DELTASONE) 20 MG tablet; 1 tab 3 x day for  3 days, then 1 tab 2 x day for 3 days, then 1 tab 1 x day for 5 days  Dispense: 20 tablet; Refill: 1  7. Medication management  - CBC with Differential/Platelet - COMPLETE METABOLIC PANEL WITH GFR - Magnesium - Lipid panel - TSH - Hemoglobin A1c - Insulin, random - VITAMIN D 25 Hydroxyl        Discussed  regular exercise, BP monitoring, weight control to achieve/maintain BMI less than 25 and discussed med and SE's. Recommended labs to assess and monitor clinical status with further disposition pending results of labs.  I discussed the assessment and treatment plan with the patient. The patient was provided an opportunity to ask questions and all were answered. The patient agreed with the plan and demonstrated an understanding of the instructions.  I provided over 30 minutes of exam,  counseling, chart review and  complex critical decision making.  Kirtland Bouchard, MD

## 2019-04-28 LAB — LIPID PANEL
Cholesterol: 251 mg/dL — ABNORMAL HIGH (ref <200)
HDL: 64 mg/dL (ref >=50)
LDL Cholesterol (Calc): 157 mg/dL (calc) — ABNORMAL HIGH
Non-HDL Cholesterol (Calc): 187 mg/dL (calc) — ABNORMAL HIGH (ref <130)
Total CHOL/HDL Ratio: 3.9 (calc) (ref <5.0)
Triglycerides: 162 mg/dL — ABNORMAL HIGH (ref <150)

## 2019-04-28 LAB — CBC WITH DIFFERENTIAL/PLATELET
Absolute Monocytes: 364 cells/uL (ref 200–950)
Basophils Absolute: 90 cells/uL (ref 0–200)
Basophils Relative: 1.6 %
Eosinophils Absolute: 179 cells/uL (ref 15–500)
Eosinophils Relative: 3.2 %
HCT: 38.1 % (ref 35.0–45.0)
Hemoglobin: 12.8 g/dL (ref 11.7–15.5)
Lymphs Abs: 963 cells/uL (ref 850–3900)
MCH: 30.9 pg (ref 27.0–33.0)
MCHC: 33.6 g/dL (ref 32.0–36.0)
MCV: 92 fL (ref 80.0–100.0)
MPV: 10.5 fL (ref 7.5–12.5)
Monocytes Relative: 6.5 %
Neutro Abs: 4004 cells/uL (ref 1500–7800)
Neutrophils Relative %: 71.5 %
Platelets: 255 10*3/uL (ref 140–400)
RBC: 4.14 10*6/uL (ref 3.80–5.10)
RDW: 12.3 % (ref 11.0–15.0)
Total Lymphocyte: 17.2 %
WBC: 5.6 10*3/uL (ref 3.8–10.8)

## 2019-04-28 LAB — COMPLETE METABOLIC PANEL WITH GFR
AG Ratio: 1.7 (calc) (ref 1.0–2.5)
ALT: 18 U/L (ref 6–29)
AST: 21 U/L (ref 10–35)
Albumin: 4.3 g/dL (ref 3.6–5.1)
Alkaline phosphatase (APISO): 73 U/L (ref 37–153)
BUN: 11 mg/dL (ref 7–25)
CO2: 26 mmol/L (ref 20–32)
Calcium: 9.7 mg/dL (ref 8.6–10.4)
Chloride: 98 mmol/L (ref 98–110)
Creat: 0.72 mg/dL (ref 0.50–0.99)
GFR, Est African American: 99 mL/min/{1.73_m2} (ref >=60)
GFR, Est Non African American: 85 mL/min/{1.73_m2} (ref >=60)
Globulin: 2.5 g/dL (calc) (ref 1.9–3.7)
Glucose, Bld: 89 mg/dL (ref 65–99)
Potassium: 4 mmol/L (ref 3.5–5.3)
Sodium: 132 mmol/L — ABNORMAL LOW (ref 135–146)
Total Bilirubin: 0.4 mg/dL (ref 0.2–1.2)
Total Protein: 6.8 g/dL (ref 6.1–8.1)

## 2019-04-28 LAB — HEMOGLOBIN A1C
Hgb A1c MFr Bld: 5.6 % of total Hgb (ref <5.7)
Mean Plasma Glucose: 114 (calc)
eAG (mmol/L): 6.3 (calc)

## 2019-04-28 LAB — VITAMIN D 25 HYDROXY (VIT D DEFICIENCY, FRACTURES): Vit D, 25-Hydroxy: 30 ng/mL (ref 30–100)

## 2019-04-28 LAB — TSH: TSH: 0.86 mIU/L (ref 0.40–4.50)

## 2019-04-28 LAB — MAGNESIUM: Magnesium: 1.7 mg/dL (ref 1.5–2.5)

## 2019-04-28 LAB — INSULIN, RANDOM: Insulin: 5.7 u[IU]/mL

## 2019-05-23 ENCOUNTER — Ambulatory Visit: Payer: Medicare Other | Admitting: Surgery

## 2019-05-23 ENCOUNTER — Encounter (HOSPITAL_COMMUNITY): Payer: Medicare Other

## 2019-05-27 ENCOUNTER — Other Ambulatory Visit: Payer: Self-pay

## 2019-05-27 DIAGNOSIS — I728 Aneurysm of other specified arteries: Secondary | ICD-10-CM

## 2019-05-30 ENCOUNTER — Other Ambulatory Visit: Payer: Self-pay

## 2019-05-30 ENCOUNTER — Encounter: Payer: Self-pay | Admitting: Surgery

## 2019-05-30 ENCOUNTER — Ambulatory Visit (INDEPENDENT_AMBULATORY_CARE_PROVIDER_SITE_OTHER): Payer: Medicare Other | Admitting: Surgery

## 2019-05-30 ENCOUNTER — Ambulatory Visit (HOSPITAL_COMMUNITY)
Admission: RE | Admit: 2019-05-30 | Discharge: 2019-05-30 | Disposition: A | Discharge status: Home / Self Care | Payer: Medicare Other | Source: Ambulatory Visit | Attending: Family | Admitting: Family

## 2019-05-30 VITALS — BP 183/85 | HR 67 | Temp 97.5°F | Resp 20 | Ht 64.0 in | Wt 156.6 lb

## 2019-05-30 DIAGNOSIS — I728 Aneurysm of other specified arteries: Secondary | ICD-10-CM

## 2019-05-30 NOTE — Progress Notes (Signed)
[facility]ist [facility] [place]  Patient name: Jacqueline Jenkins MRN: [id number] DOB: [birth year] Sex: female   REASON FOR VISIT:    Follow up  HISOTRY OF PRESENT ILLNESS:    Jacqueline Jenkins is a 68 y.o. female who in 2013 underwent coil embolization of a 5 cm splenic artery aneurysm.  Her aneurysm was detected by ultrasound for gallstones.  She has undergone treatment for breast cancer as well as closure of a dural fistula.  She also tells me that her sister recently died from autoimmune vasculitis  The patient suffers from hypercholesterolemia however she cannot take statin secondary to elevated liver enzymes.  She is a non-smoker.  She is medically managed for hypertension.   PAST MEDICAL HISTORY:   Past Medical History:  Diagnosis Date  . Allergic rhinitis, cause unspecified   . Anemia    history of anemia while teenager  . BRCA2 positive   . Breast cancer ([place]) 2016   Left Breast Cancer  . Breast cancer of upper-outer quadrant of left female breast ([place])    chemo complete 01/2016, radiation 04/2016  . DDD (degenerative disc disease)   . Family history of breast cancer 08/30/2015   Dx. In maternal grandmother in her 53s-40; dx. In maternal first cousin in her 17s-60s   . Family history of colon cancer 08/30/2015   Dx. In maternal uncle in his 30s   . Hyperlipidemia   . Hypertension   . Neuropathy   . Osteopenia 12/2011   Spine T -0.4, Femur -2.1  . Personal history of chemotherapy 2016   Left Breast Cancer  . Personal history of radiation therapy 2016   Left Breast Cancer  . PONV (postoperative nausea and vomiting)   . Splenic artery aneurysm ([place]) 2013   COILS DONE  . Vitamin D deficiency      FAMILY HISTORY:   Family History  Problem Relation Age of Onset  . Stroke Mother   . Osteoporosis Mother   . Hyperlipidemia Mother   . Hypertension Mother   . Other Mother        varicose veins  . Deep vein thrombosis Mother    . Diverticulitis Mother   . Breast cancer Maternal Grandmother        dx. 30s-40s; when pt's mother was 50 years old  . Other Father        bleeding problems  . Hypertension Father   . Heart attack Father   . Stroke Father   . Other Sister        one sister had a hysterectomy for fibroids  . Vasculitis Sister   . Leukemia Maternal Aunt        dx. 50s-60s  . Pancreatic cancer Maternal Uncle        dx. late 60s-early 70s; (x2 maternal uncles)  . Heart attack Paternal Aunt   . Stroke Paternal Aunt   . Stroke Maternal Grandfather   . Heart attack Paternal Grandmother   . Stroke Paternal Grandmother   . Heart Problems Paternal Grandfather   . Colon cancer Maternal Uncle        dx. late 66s  . Heart defect Maternal Aunt        possible  . Breast cancer Cousin        dx. 50s-60s; maternal first  . Heart attack Paternal Uncle   . Stroke Paternal Uncle   . Lung cancer Maternal Uncle        treated at [facility]; d.  53s    SOCIAL HISTORY:   Social History   Tobacco Use  . Smoking status: Never Smoker  . Smokeless tobacco: Never Used  Substance Use Topics  . Alcohol use: Yes    Comment: 3-4 times per year     ALLERGIES:   Allergies  Allergen Reactions  . Lescol [Fluvastatin Sodium] Other (See Comments)    Myalgia  . Lipitor [Atorvastatin] Other (See Comments)    Increased LFT's  . Vytorin [Ezetimibe-Simvastatin] Other (See Comments)    myalgia     CURRENT MEDICATIONS:   Current Outpatient Medications  Medication Sig Dispense Refill  . anastrozole (ARIMIDEX) 1 MG tablet Take 1 tablet (1 mg total) by mouth daily at 3 pm. 90 tablet 3  . benazepril-hydrochlorthiazide (LOTENSIN HCT) 20-12.5 MG tablet TAKE 1 TABLET BY MOUTH EVERY DAY 90 tablet 3  . Calcium Carb-Cholecalciferol (CALCIUM 1000 + D PO) Take 2 tablets by mouth daily. Gummies    . naproxen sodium (ANAPROX) 220 MG tablet Take 220 mg by mouth daily as needed (pain).    . predniSONE (DELTASONE) 20 MG tablet 1 tab  3 x day for 3 days, then 1 tab 2 x day for 3 days, then 1 tab 1 x day for 5 days 20 tablet 1  . VITAMIN D PO Take 5,000 Units by mouth. Takes 1 capsule every other day.     No current facility-administered medications for this visit.     REVIEW OF SYSTEMS:   [X]  denotes positive finding, [ ]  denotes negative finding Cardiac  Comments:  Chest pain or chest pressure:    Shortness of breath upon exertion:    Short of breath when lying flat:    Irregular heart rhythm:        Vascular    Pain in calf, thigh, or hip brought on by ambulation:    Pain in feet at night that wakes you up from your sleep:     Blood clot in your veins:    Leg swelling:         Pulmonary    Oxygen at home:    Productive cough:     Wheezing:         Neurologic    Sudden weakness in arms or legs:     Sudden numbness in arms or legs:     Sudden onset of difficulty speaking or slurred speech:    Temporary loss of vision in one eye:     Problems with dizziness:         Gastrointestinal    Blood in stool:     Vomited blood:         Genitourinary    Burning when urinating:     Blood in urine:        Psychiatric    Major depression:         Hematologic    Bleeding problems:    Problems with blood clotting too easily:        Skin    Rashes or ulcers:        Constitutional    Fever or chills:      PHYSICAL EXAM:   There were no vitals filed for this visit.  GENERAL: The patient is a well-nourished female, in no acute distress. The vital signs are documented above. CARDIAC: There is a regular rate and rhythm.  PULMONARY: Non-labored respirations MUSCULOSKELETAL: There are no major deformities or cyanosis. NEUROLOGIC: No focal weakness or paresthesias are detected. SKIN: There  are no ulcers or rashes noted. PSYCHIATRIC: The patient has a normal affect.  STUDIES:   I have reviewed her ultrasound with the following results:   1. No aortic aneuysm visualized 2. Patent Hepatic, Celiac and  Superior mesenteric arteries. 3. 3.61 x 3.87 cm Splenic aneurysm visualized.     MEDICAL ISSUES:   Splenic aneurysm: The size of her aneurysm has significantly decreased following coil embolization.  She has recently had a sister die from autoimmune vasculitis.  This raises the question as to whether or not her splenic aneurysm is related to a vasculitis.  She has undergone a work-up so far which is been negative.  In light of this new finding when I see her back in 2 years I am going to get a CT angiogram of the abdomen and pelvis to make sure she has not formed any additional areas of aneurysmal change.    Leia Alf, MD, FACS [facility] [phone] Pager [phone]

## 2019-06-02 DIAGNOSIS — Z23 Encounter for immunization: Secondary | ICD-10-CM | POA: Diagnosis not present

## 2019-07-02 ENCOUNTER — Other Ambulatory Visit: Payer: Self-pay | Admitting: Internal Medicine

## 2019-07-07 ENCOUNTER — Ambulatory Visit: Payer: Self-pay | Admitting: Physician Assistant

## 2019-07-22 ENCOUNTER — Other Ambulatory Visit: Payer: Self-pay

## 2019-07-27 ENCOUNTER — Encounter: Payer: Self-pay | Admitting: Adult Health

## 2019-07-27 NOTE — Progress Notes (Signed)
Patient ID: Jacqueline Jenkins, female   DOB: June 10, 1951, 68 y.o.   MRN: [id number]   MEDICARE ANNUAL WELLNESS VISIT AND CPE  Assessment:   Encounter for Medicare annual wellness exam 1 year  Aneurysm of splenic artery ([place]) Control blood pressure, cholesterol, glucose, increase exercise.   Essential hypertension - continue medications, DASH diet, exercise and monitor at home. Call if greater than 130/80.  -     CBC with Differential/Platelet -     CMP/GFR  Hyperlipidemia, unspecified hyperlipidemia type -Declines all medications, intolerant zetia, statins - low risk personal history; does have family history  - check lipids, decrease fatty foods, increase activity.  -     Lipid panel  Hx of prediabetes Recent A1Cs at goal Discussed diet/exercise, weight management  Defer A1C; check CMP Discussed general issues about diabetes pathophysiology and management., Educational material distributed., Suggested low cholesterol diet., Encouraged aerobic exercise., Discussed foot care., Reminded to get yearly retinal exam.  Medication management -     Magnesium  Malignant neoplasm of upper-outer quadrant of left breast in female, estrogen receptor positive ([place]) Continue follow up Dr. Lindi Adie  Vitamin D deficiency -     VITAMIN D 25 Hydroxy (Vit-D Deficiency, Fractures)  BRCA2 positive Continue follow up x 2016  Dural arteriovenous fistula Control blood pressure, cholesterol, glucose, increase exercise.   Osteopenia - Dexa scheduled 09/2019, continue Vit D and Ca, weight bearing exercises  Left renal mass Get renal US 6 months from CT, discussed and ordered   Future Appointments  Date Time Provider [place]  09/08/2019  1:00 PM [facility] DX DEXA 1 GI-BCGD[facility] GI-BREAST CE  09/08/2019  1:40 PM [facility] DIAG TOMO 1 GI-BCGMM GI-BREAST CE  11/02/2019  3:00 PM Vicie Mutters, PA-C [facility] None  03/30/2020  9:45 AM Nicholas Lose, MD [facility] None    Plan:   During the course of the  visit the patient was educated and counseled about appropriate screening and preventive services including:    Pneumococcal vaccine   Influenza vaccine  Td vaccine  Screening electrocardiogram  Bone densitometry screening  Colorectal cancer screening  Diabetes screening  Glaucoma screening  Nutrition counseling   Advanced directives: requested   Subjective:   Jacqueline Jenkins is a 68 y.o.  female who presents for Medicare Annual Wellness Visit and follow up on cholesterol, htn, glucose, weight, vitamin D deficiency.   She is primary caregiver for husband with supranuclear palsy/Parkinson's plus with frequently.   She follows with Dr. Trula Slade for large 5 cm splenic artery aneurysm.   She underwent: Embolization in June 2013.  Continues to follow up annually.   She has history of L breast lumpectomy (+ER/+PR/HER2 Neg)  in Dec 2016 followed by chemoradiation, underwent BSO due to + BRCA2 mutation, on anastrazole, and follows with Dr. Lindi Adie. Will get DEXA in jan with follow up, order is scheduled 09/08/2019.    Has history of diverticulitis, on recent CT AB 04/2018 showed renal cyst versus mass changed from 2013 but not from 2017. With cancer history and BRCA mutation will get repeat study 1 year, discussed and will schedule renal US.   BMI is Body mass index is 26.95 kg/m., she has been working on diet and exercise. Wt Readings from Last 3 Encounters:  08/01/19 157 lb (71.2 kg)  05/30/19 156 lb 9.6 oz (71 kg)  04/27/19 159 lb 9.6 oz (72.4 kg)   She has had elevated blood pressure without the diagnosis of HTN.   Her blood pressure has been  controlled at home, & today their BP is BP: 120/74 She does not workout, but active caring for husband She denies chest pain, shortness of breath, dizziness. No leg swelling.  She is on cholesterol medication, numerous statins, zetia has been of x June due to muscle thigh pain that improved, hx of LFT elevation as well. Delines other agents  or referral to lipid clinic.  Her cholesterol is not at goal. The cholesterol last visit was:  Lab Results  Component Value Date   CHOL 251 (H) 04/27/2019   HDL 64 04/27/2019   LDLCALC 157 (H) 04/27/2019   TRIG 162 (H) 04/27/2019   CHOLHDL 3.9 04/27/2019   She has had prediabetes.  She has not been working on diet and exercise for hx of prediabetes, and denies foot ulcerations, hyperglycemia, hypoglycemia , increased appetite, nausea, paresthesia of the feet, polydipsia, polyuria, visual disturbances, vomiting and weight loss. Last A1C in the office was:  Lab Results  Component Value Date   HGBA1C 5.6 04/27/2019   Patient is on Vitamin D supplement,  Taking 5000 IU 5/7 days/week   Lab Results  Component Value Date   VD25OH 30 04/27/2019       Medication Review: Current Outpatient Medications on File Prior to Visit  Medication Sig Dispense Refill  . anastrozole (ARIMIDEX) 1 MG tablet Take 1 tablet (1 mg total) by mouth daily at 3 pm. 90 tablet 3  . benazepril-hydrochlorthiazide (LOTENSIN HCT) 20-12.5 MG tablet Take 1 tablet Daily for BP 90 tablet 3  . Calcium Carb-Cholecalciferol (CALCIUM 1000 + D PO) Take 2 tablets by mouth daily. Gummies    . naproxen sodium (ANAPROX) 220 MG tablet Take 220 mg by mouth daily as needed (pain).    Marland Kitchen VITAMIN D PO Take 5,000 Units by mouth. Takes 1 capsule every other day.     No current facility-administered medications on file prior to visit.     Current Problems (verified) Patient Active Problem List   Diagnosis Date Noted  . Left kidney mass 04/06/2018  . FHx: heart disease 12/28/2017  . Osteopenia 06/17/2017  . Dural arteriovenous fistula 03/05/2017  . Genetic testing 09/19/2015  . BRCA2 positive 09/19/2015  . Breast cancer of upper-outer quadrant of left female breast ([place]) 08/21/2015  . Medication management 06/07/2015  . HTN (hypertension) 06/07/2015  . Other abnormal glucose (hx of prediabetes) 11/16/2013  . Allergic rhinitis   .  Hyperlipidemia   . Lumbar degenerative disc disease   . Vitamin D deficiency   . Aneurysm of splenic artery ([place]) 01/19/2012    Screening Tests Immunization History  Administered Date(s) Administered  . DT 01/22/2015  . Influenza Split 06/08/2014, 06/02/2016  . Influenza, High Dose Seasonal PF 06/08/2014, 05/27/2017, 06/01/2018, 06/02/2019  . Influenza-Unspecified 06/14/2013, 06/04/2015, 05/27/2017  . Meningococcal Conjugate 09/02/2011  . PPD Test 01/18/2014  . Pneumococcal Conjugate-13 12/31/2011, 06/17/2017  . Pneumococcal Polysaccharide-23 09/02/2011  . Pneumococcal-Unspecified 09/22/1995, 09/02/2011  . Td 11/27/1994, 04/29/2004, 01/22/2015     UTD on vaccines  Preventative care: Last colonoscopy: 2013 DEXA 07/2015 L fem T -2.1 - scheduled in 09/2020 with Dr. Lindi Adie  MGM 09/2018 PAP 03/2019 at GYN Echo 2017 US carotid 11/2016 [place] AB 2013  Follows Dr. Marvel [facility]   Names of Other Physician/Practitioners you currently use: 1. [facility] here for primary care 2. Dr. Kathrin Penner, eye doctor, last visit q 6 months, wears glasses  3. Dr. Jason Coop, dentist, last visit 2020 4. Dr. Hardie Shackleton did dental implant.  Patient Care Team: Unk Pinto, MD as PCP - General (Internal Medicine) Serafina Mitchell, MD as Consulting Physician (Vascular Surgery) Carol Ada, MD as Consulting Physician (Gastroenterology) Martinique, Amy, MD as Consulting Physician (Dermatology) Nicholas Lose, MD as Consulting Physician (Hematology and Oncology) Shon Hough, MD as Consulting Physician (Ophthalmology) Paula Compton, MD as Consulting Physician (Obstetrics and Gynecology)  History reviewed: allergies, current medications, past family history, past medical history, past social history, past surgical history and problem list Allergies Allergies  Allergen Reactions  . Lescol [Fluvastatin Sodium] Other (See Comments)    Myalgia  . Lipitor  [Atorvastatin] Other (See Comments)    Increased LFT's  . Vytorin [Ezetimibe-Simvastatin] Other (See Comments)    myalgia    SURGICAL HISTORY She  has a past surgical history that includes Tonsillectomy; Spine surgery (1998); Spine surgery (2001, 2010); Cervical disc surgery (1998); Lumbar disc surgery (2001, 2010); splenic aneurysm (02/10/2012); Colonoscopy (Jan. 7, 2014); visceral angiogram (N/A, 02/10/2012); Breast lumpectomy with radioactive seed and sentinel lymph node biopsy (Left, 08/29/2015); Re-excision of breast lumpectomy (Left, 09/07/2015); Portacath placement (Bilateral, 10/09/2015); Portacath removal; Laparoscopic bilateral salpingo oophorectomy (Bilateral, 05/16/2016); IR ANGIO INTRA EXTRACRAN SEL INTERNAL CAROTID BILAT MOD SED (02/03/2017); IR NEURO EACH ADD'L AFTER BASIC UNI RIGHT (MS) (02/03/2017); IR ANGIO VERTEBRAL SEL VERTEBRAL BILAT MOD SED (02/03/2017); IR ANGIO EXTERNAL CAROTID SEL EXT CAROTID BILAT MOD SED (02/03/2017); Radiology with anesthesia (N/A, 03/05/2017); IR NEURO EACH ADD'L AFTER BASIC UNI RIGHT (MS) (03/05/2017); IR Angiogram Follow Up Study (03/05/2017); IR Transcath/Emboliz (03/05/2017); IR ANGIO INTRA EXTRACRAN SEL COM CAROTID INNOMINATE UNI R MOD SED (03/05/2017); IR ANGIO EXTERNAL CAROTID SEL EXT CAROTID UNI R MOD SED (03/05/2017); and Breast lumpectomy (Left, 2016).   FAMILY HISTORY Her family history includes Breast cancer in her cousin and maternal grandmother; Colon cancer in her maternal uncle; Deep vein thrombosis in her mother; Diverticulitis in her mother; Heart Problems in her paternal grandfather; Heart attack in her father, paternal aunt, paternal grandmother, and paternal uncle; Heart defect in her maternal aunt; Hyperlipidemia in her mother; Hypertension in her father and mother; Leukemia in her maternal aunt; Lung cancer in her maternal uncle; Osteoporosis in her mother; Other in her father, mother, and sister; Pancreatic cancer in her maternal uncle; Stroke in her father,  maternal grandfather, mother, paternal aunt, paternal grandmother, and paternal uncle; Vasculitis in her sister.   SOCIAL HISTORY She  reports that she has never smoked. She has never used smokeless tobacco. She reports current alcohol use. She reports that she does not use drugs.  MEDICARE WELLNESS OBJECTIVES: Physical activity:   Cardiac risk factors:   Depression/mood screen:   Depression screen [facility] 2/9 07/01/2018  Decreased Interest 0  Down, Depressed, Hopeless 0  PHQ - 2 Score 0    ADLs:  No flowsheet data found.   Cognitive Testing  Alert? Yes  Normal Appearance?Yes  Oriented to person? Yes  Place? Yes   Time? Yes  Recall of three objects?  Yes  Can perform simple calculations? Yes  Displays appropriate judgment?Yes  Can read the correct time from a watch face?Yes  EOL planning: Does Patient Have a Medical Advance Directive?: Yes Type of Advance Directive: Healthcare Power of Attorney, Living will Does patient want to make changes to medical advance directive?: No - Patient declined Copy of [place] in Chart?: No - copy requested    Review of Systems  Constitutional: Negative for chills, fever, malaise/fatigue and weight loss.  HENT: Negative for congestion, ear pain, nosebleeds and  sore throat.   Eyes: Negative.   Respiratory: Negative for cough, shortness of breath and wheezing.   Cardiovascular: Negative for chest pain, palpitations and leg swelling.  Gastrointestinal: Positive for heartburn (occasionally). Negative for blood in stool, constipation, diarrhea, melena, nausea and vomiting.  Genitourinary: Negative for dysuria, frequency and urgency.  Musculoskeletal: Negative.   Skin: Negative.   Neurological: Negative for dizziness, sensory change, loss of consciousness and headaches.  Psychiatric/Behavioral: Negative for depression. The patient is not nervous/anxious and does not have insomnia.      Objective:     BP 120/74   Pulse 88    Temp (!) 96.6 F (35.9 C)   Ht 5' 4"  (1.626 m)   Wt 157 lb (71.2 kg)   SpO2 98%   BMI 26.95 kg/m   General Appearance: Well nourished, alert, WD/WN, female and in no apparent distress. Eyes: PERRLA, EOMs, conjunctiva no swelling or erythema, normal fundi and vessels. Sinuses: No frontal/maxillary tenderness ENT/Mouth: EACs patent / TMs  nl. Nares clear without erythema, swelling, mucoid exudates. Oral hygiene is good. No erythema, swelling, or exudate. Tongue normal, non-obstructing. Tonsils not swollen or erythematous. Hearing normal.  Neck: Supple, thyroid normal. No bruits, nodes or JVD. Respiratory: Respiratory effort normal.  BS equal and clear bilateral without rales, rhonci, wheezing or stridor. Cardio: Heart sounds are normal with regular rate and rhythm and no murmurs, rubs or gallops. Peripheral pulses are normal and equal bilaterally without edema. No aortic or femoral bruits. Chest: symmetric with normal excursions and percussion. Breasts: Symmetric, without lumps, nipple discharge, retractions, or fibrocystic changes.  Abdomen: Flat, soft  with nl bowel sounds. Nontender, no guarding, rebound, hernias, masses, or organomegaly.  Lymphatics: Non tender without lymphadenopathy.  Genitourinary: Deferred to ObGyn Musculoskeletal: Full ROM all peripheral extremities, joint stability, 5/5 strength, and normal gait. Skin: Warm and dry without rashes, lesions, cyanosis, clubbing or  ecchymosis. Multiple seborrheic keratosis across the chest and the abdomen. Neuro: Cranial nerves intact, reflexes equal bilaterally. Normal muscle tone, no cerebellar symptoms. Sensation intact.  Pysch: Alert and oriented X 3, normal affect, Insight and Judgment appropriate.    Medicare Attestation I have personally reviewed: The patient's medical and social history Their use of alcohol, tobacco or illicit drugs Their current medications and supplements The patient's functional ability including  ADLs,fall risks, home safety risks, cognitive, and hearing and visual impairment Diet and physical activities Evidence for depression or mood disorders  The patient's weight, height, BMI, and visual acuity have been recorded in the chart.  I have made referrals, counseling, and provided education to the patient based on review of the above and I have provided the patient with a written personalized care plan for preventive services.  Over 40 minutes of exam, counseling, chart review was performed.   Izora Ribas, NP   08/01/2019

## 2019-08-01 ENCOUNTER — Ambulatory Visit: Payer: Medicare Other | Admitting: Adult Health

## 2019-08-01 ENCOUNTER — Encounter: Payer: Self-pay | Admitting: Adult Health

## 2019-08-01 ENCOUNTER — Other Ambulatory Visit: Payer: Self-pay

## 2019-08-01 VITALS — BP 120/74 | HR 88 | Temp 96.6°F | Ht 64.0 in | Wt 157.0 lb

## 2019-08-01 DIAGNOSIS — E785 Hyperlipidemia, unspecified: Secondary | ICD-10-CM | POA: Diagnosis not present

## 2019-08-01 DIAGNOSIS — E559 Vitamin D deficiency, unspecified: Secondary | ICD-10-CM

## 2019-08-01 DIAGNOSIS — J309 Allergic rhinitis, unspecified: Secondary | ICD-10-CM

## 2019-08-01 DIAGNOSIS — M5136 Other intervertebral disc degeneration, lumbar region: Secondary | ICD-10-CM

## 2019-08-01 DIAGNOSIS — I671 Cerebral aneurysm, nonruptured: Secondary | ICD-10-CM

## 2019-08-01 DIAGNOSIS — I10 Essential (primary) hypertension: Secondary | ICD-10-CM | POA: Diagnosis not present

## 2019-08-01 DIAGNOSIS — N2889 Other specified disorders of kidney and ureter: Secondary | ICD-10-CM

## 2019-08-01 DIAGNOSIS — R7309 Other abnormal glucose: Secondary | ICD-10-CM

## 2019-08-01 DIAGNOSIS — C50412 Malignant neoplasm of upper-outer quadrant of left female breast: Secondary | ICD-10-CM

## 2019-08-01 DIAGNOSIS — Z0000 Encounter for general adult medical examination without abnormal findings: Secondary | ICD-10-CM

## 2019-08-01 DIAGNOSIS — M8580 Other specified disorders of bone density and structure, unspecified site: Secondary | ICD-10-CM

## 2019-08-01 DIAGNOSIS — Z170 Estrogen receptor positive status [ER+]: Secondary | ICD-10-CM

## 2019-08-01 DIAGNOSIS — Z6826 Body mass index (BMI) 26.0-26.9, adult: Secondary | ICD-10-CM

## 2019-08-01 DIAGNOSIS — Z79899 Other long term (current) drug therapy: Secondary | ICD-10-CM | POA: Diagnosis not present

## 2019-08-01 DIAGNOSIS — I728 Aneurysm of other specified arteries: Secondary | ICD-10-CM

## 2019-08-01 NOTE — Patient Instructions (Signed)
  Ms. Jacqueline Jenkins , Thank you for taking time to come for your Medicare Wellness Visit. I appreciate your ongoing commitment to your health goals. Please review the following plan we discussed and let me know if I can assist you in the future.    This is a list of the screening recommended for you and due dates:  Health Maintenance  Topic Date Due  . Mammogram  09/06/2020  . Pap Smear  03/01/2021  . Colon Cancer Screening  01/14/2022  . Tetanus Vaccine  01/21/2025  . Flu Shot  Completed  . DEXA scan (bone density measurement)  Completed  .  Hepatitis C: One time screening is recommended by Center for Disease Control  (CDC) for  adults born from [birth year] through 1967.   Completed  . Pneumonia vaccines  Completed     Please follow up if you don't hear anything about a kidney ultrasound appointment in 2 weeks. Can call our office and ask for [place].

## 2019-08-02 ENCOUNTER — Other Ambulatory Visit: Payer: Self-pay | Admitting: Adult Health

## 2019-08-02 DIAGNOSIS — E785 Hyperlipidemia, unspecified: Secondary | ICD-10-CM

## 2019-08-02 LAB — COMPLETE METABOLIC PANEL WITH GFR
AG Ratio: 1.6 (calc) (ref 1.0–2.5)
ALT: 20 U/L (ref 6–29)
AST: 25 U/L (ref 10–35)
Albumin: 4.4 g/dL (ref 3.6–5.1)
Alkaline phosphatase (APISO): 62 U/L (ref 37–153)
BUN: 19 mg/dL (ref 7–25)
CO2: 25 mmol/L (ref 20–32)
Calcium: 9.9 mg/dL (ref 8.6–10.4)
Chloride: 98 mmol/L (ref 98–110)
Creat: 0.6 mg/dL (ref 0.60–0.93)
GFR, Est African American: 107 mL/min/{1.73_m2} (ref >=60)
GFR, Est Non African American: 92 mL/min/{1.73_m2} (ref >=60)
Globulin: 2.8 g/dL (calc) (ref 1.9–3.7)
Glucose, Bld: 91 mg/dL (ref 65–99)
Potassium: 3.7 mmol/L (ref 3.5–5.3)
Sodium: 134 mmol/L — ABNORMAL LOW (ref 135–146)
Total Bilirubin: 0.4 mg/dL (ref 0.2–1.2)
Total Protein: 7.2 g/dL (ref 6.1–8.1)

## 2019-08-02 LAB — CBC WITH DIFFERENTIAL/PLATELET
Absolute Monocytes: 462 cells/uL (ref 200–950)
Basophils Absolute: 92 cells/uL (ref 0–200)
Basophils Relative: 1.4 %
Eosinophils Absolute: 205 cells/uL (ref 15–500)
Eosinophils Relative: 3.1 %
HCT: 39.6 % (ref 35.0–45.0)
Hemoglobin: 13.4 g/dL (ref 11.7–15.5)
Lymphs Abs: 957 cells/uL (ref 850–3900)
MCH: 30.8 pg (ref 27.0–33.0)
MCHC: 33.8 g/dL (ref 32.0–36.0)
MCV: 91 fL (ref 80.0–100.0)
MPV: 10.2 fL (ref 7.5–12.5)
Monocytes Relative: 7 %
Neutro Abs: 4884 cells/uL (ref 1500–7800)
Neutrophils Relative %: 74 %
Platelets: 282 10*3/uL (ref 140–400)
RBC: 4.35 10*6/uL (ref 3.80–5.10)
RDW: 12.1 % (ref 11.0–15.0)
Total Lymphocyte: 14.5 %
WBC: 6.6 10*3/uL (ref 3.8–10.8)

## 2019-08-02 LAB — LIPID PANEL
Cholesterol: 299 mg/dL — ABNORMAL HIGH (ref <200)
HDL: 67 mg/dL (ref >=50)
LDL Cholesterol (Calc): 203 mg/dL (calc) — ABNORMAL HIGH
Non-HDL Cholesterol (Calc): 232 mg/dL (calc) — ABNORMAL HIGH (ref <130)
Total CHOL/HDL Ratio: 4.5 (calc) (ref <5.0)
Triglycerides: 136 mg/dL (ref <150)

## 2019-08-02 LAB — MAGNESIUM: Magnesium: 1.9 mg/dL (ref 1.5–2.5)

## 2019-08-02 LAB — TSH: TSH: 1.07 mIU/L (ref 0.40–4.50)

## 2019-08-15 ENCOUNTER — Ambulatory Visit
Admission: RE | Admit: 2019-08-15 | Discharge: 2019-08-15 | Disposition: A | Discharge status: Home / Self Care | Payer: Medicare Other | Source: Ambulatory Visit | Attending: Adult Health | Admitting: Adult Health

## 2019-08-15 DIAGNOSIS — N281 Cyst of kidney, acquired: Secondary | ICD-10-CM | POA: Diagnosis not present

## 2019-08-15 DIAGNOSIS — N2889 Other specified disorders of kidney and ureter: Secondary | ICD-10-CM

## 2019-09-08 ENCOUNTER — Ambulatory Visit
Admission: RE | Admit: 2019-09-08 | Discharge: 2019-09-08 | Disposition: A | Discharge status: Home / Self Care | Payer: Medicare Other | Source: Ambulatory Visit | Attending: Hematology and Oncology | Admitting: Hematology and Oncology

## 2019-09-08 ENCOUNTER — Other Ambulatory Visit: Payer: Self-pay | Admitting: Hematology and Oncology

## 2019-09-08 ENCOUNTER — Other Ambulatory Visit: Payer: Self-pay

## 2019-09-08 DIAGNOSIS — R922 Inconclusive mammogram: Secondary | ICD-10-CM | POA: Diagnosis not present

## 2019-09-08 DIAGNOSIS — Z853 Personal history of malignant neoplasm of breast: Secondary | ICD-10-CM

## 2019-09-08 DIAGNOSIS — R921 Mammographic calcification found on diagnostic imaging of breast: Secondary | ICD-10-CM | POA: Diagnosis not present

## 2019-09-08 DIAGNOSIS — Z780 Asymptomatic menopausal state: Secondary | ICD-10-CM

## 2019-09-09 ENCOUNTER — Other Ambulatory Visit: Payer: Self-pay | Admitting: Hematology and Oncology

## 2019-09-09 MED ORDER — ALENDRONATE SODIUM 70 MG PO TABS: 70.0000 mg | ORAL_TABLET | ORAL | 3 refills | Status: DC

## 2019-09-09 NOTE — Progress Notes (Signed)
I informed Jacqueline Jenkins that her bone density showed osteoporosis with a T score of -2.5. I recommended that she start Fosamax along with calcium and vitamin D. She understands risks and benefits of Fosamax and is willing to proceed. Her mammogram showed what appears to be fat necrosis.  She will have another mammogram in 6 months.  She is also due for a breast MRI in July. We will see her back in July for follow-up.

## 2019-10-07 ENCOUNTER — Ambulatory Visit: Payer: Medicare Other | Admitting: Internal Medicine

## 2019-10-17 ENCOUNTER — Ambulatory Visit: Payer: Medicare Other | Attending: Internal Medicine

## 2019-10-17 DIAGNOSIS — Z23 Encounter for immunization: Secondary | ICD-10-CM | POA: Insufficient documentation

## 2019-10-17 NOTE — Progress Notes (Signed)
   Covid-19 Vaccination Clinic  Name:  Jacqueline Jenkins    MRN: [id number] DOB: 02/14/1951  10/17/2019  [name] was observed post Covid-19 immunization for 15 minutes without incidence. She was provided with Vaccine Information Sheet and instruction to access the V-Safe system.   [name] was instructed to call 911 with any severe reactions post vaccine: Marland Kitchen Difficulty breathing  . Swelling of your face and throat  . A fast heartbeat  . A bad rash all over your body  . Dizziness and weakness    Immunizations Administered    Name Date Dose VIS Date Route   Moderna COVID-19 Vaccine 10/17/2019 12:30 PM 0.5 mL 08/02/2019 Intramuscular   Manufacturer: Moderna   Lot: [id number]   [id number]

## 2019-10-25 ENCOUNTER — Encounter: Payer: Self-pay | Admitting: Physician Assistant

## 2019-10-28 DIAGNOSIS — D3131 Benign neoplasm of right choroid: Secondary | ICD-10-CM | POA: Diagnosis not present

## 2019-10-28 DIAGNOSIS — H5203 Hypermetropia, bilateral: Secondary | ICD-10-CM | POA: Diagnosis not present

## 2019-10-28 DIAGNOSIS — H25813 Combined forms of age-related cataract, bilateral: Secondary | ICD-10-CM | POA: Diagnosis not present

## 2019-10-28 DIAGNOSIS — D3132 Benign neoplasm of left choroid: Secondary | ICD-10-CM | POA: Diagnosis not present

## 2019-11-01 NOTE — Progress Notes (Signed)
Patient ID: Jacqueline Jenkins, female   DOB: [birth year], 69 y.o.   MRN: [id number]    CPE  Assessment:   Aneurysm of splenic artery (HCC) Control blood pressure, cholesterol, glucose, increase exercise.   Essential hypertension - continue medications, DASH diet, exercise and monitor at home. Call if greater than 130/80.  -     CBC with Differential/Platelet -     CMP/GFR  Hyperlipidemia, unspecified hyperlipidemia type -Declines all medications, intolerant zetia, statins - low risk personal history; does have family history? Familial cholesterolemia- may benefit from PCSK9- going to see Dr. Debara Pickett -? Would benefit from cardiac calcium score  - check lipids, decrease fatty foods, increase activity.  -     Lipid panel - check APa  Hx of prediabetes Recent A1Cs at goal Discussed diet/exercise, weight management  Defer A1C; check CMP Discussed general issues about diabetes pathophysiology and management., Educational material distributed., Suggested low cholesterol diet., Encouraged aerobic exercise., Discussed foot care., Reminded to get yearly retinal exam.  Medication management -     Magnesium  Malignant neoplasm of upper-outer quadrant of left breast in female, estrogen receptor positive ([place]) Continue follow up Dr. Lindi Adie  Vitamin D deficiency -     VITAMIN D 25 Hydroxy (Vit-D Deficiency, Fractures)  BRCA2 positive Continue follow up x 2016  Dural arteriovenous fistula Control blood pressure, cholesterol, glucose, increase exercise.   Osteoporosis - Dexa scheduled 09/2019, continue Vit D and Ca, weight bearing exercises  Left renal mass Had recent normal [place]- will not need to follow up- added to medical history  Future Appointments  Date Time Provider [place]  11/07/2019  3:00 PM Pixie Casino, MD [facility]  11/15/2019  1:00 PM [facility]  03/08/2020  1:40 PM [facility] 1 [facility]  03/30/2020  9:45 AM  Nicholas Lose, MD [facility] None  08/01/2020  2:00 PM Liane Comber, NP [facility] None  11/05/2020  3:00 PM Vicie Mutters, PA-C [facility] None     Subjective:   Jacqueline Jenkins is a 69 y.o.  female who presents for  follow up on cholesterol, htn, glucose, weight, vitamin D deficiency.   She is primary caregiver for husband with supranuclear palsy/Parkinson's, he is having a steady decline at this time.   She follows with Dr. Trula Slade for large 5 cm splenic artery aneurysm.   She underwent: Embolization in June 2013.  Continues to follow up annually.   She has history of L breast lumpectomy (+ER/+PR/HER2 Neg)  in Dec 2016 followed by chemoradiation, underwent BSO due to + BRCA2 mutation, on anastrazole, and follows with Dr. Lindi Adie. DEXA 09/08/2019  Has history of diverticulitis, on recent CT AB 04/2018 showed renal cyst had recent renal US that showed benign cyst.   BMI is Body mass index is 26.46 kg/m., she has been working on diet and exercise. Wt Readings from Last 3 Encounters:  11/02/19 159 lb (72.1 kg)  08/01/19 157 lb (71.2 kg)  05/30/19 156 lb 9.6 oz (71 kg)   She has had elevated blood pressure without the diagnosis of HTN.   Her blood pressure has been controlled at home, & today their BP is BP: 136/82 She does not workout, but active caring for husband She denies chest pain, shortness of breath, dizziness. No leg swelling.  She is on cholesterol medication, has tried numerous statins like lipitor and zetia- has had LFT elevation or muscle aches. Family history of stroke in mother, and grandparents, in their  70's.  Has appointment with lipid clinic/cardiology.  Her cholesterol is not at goal. The cholesterol last visit was:  Lab Results  Component Value Date   CHOL 299 (H) 08/01/2019   HDL 67 08/01/2019   LDLCALC 203 (H) 08/01/2019   TRIG 136 08/01/2019   CHOLHDL 4.5 08/01/2019   She has had prediabetes.  She has not been working on diet and exercise for hx of  prediabetes, and denies foot ulcerations, hyperglycemia, hypoglycemia , increased appetite, nausea, paresthesia of the feet, polydipsia, polyuria, visual disturbances, vomiting and weight loss. Last A1C in the office was:  Lab Results  Component Value Date   HGBA1C 5.6 04/27/2019   Patient is on Vitamin D supplement,  Taking 5000 IU 5/7 days/week   Lab Results  Component Value Date   VD25OH 30 04/27/2019       Medication Review: Current Outpatient Medications on File Prior to Visit  Medication Sig Dispense Refill  . alendronate (FOSAMAX) 70 MG tablet Take 1 tablet (70 mg total) by mouth once a week. Take with a full glass of water on an empty stomach. 12 tablet 3  . anastrozole (ARIMIDEX) 1 MG tablet Take 1 tablet (1 mg total) by mouth daily at 3 pm. 90 tablet 3  . benazepril-hydrochlorthiazide (LOTENSIN HCT) 20-12.5 MG tablet Take 1 tablet Daily for BP 90 tablet 3  . Calcium Carb-Cholecalciferol (CALCIUM 1000 + D PO) Take 2 tablets by mouth daily. Gummies    . naproxen sodium (ANAPROX) 220 MG tablet Take 220 mg by mouth daily as needed (pain).    Marland Kitchen VITAMIN D PO Take 5,000 Units by mouth. Takes 1 capsule every other day.     No current facility-administered medications on file prior to visit.    Current Problems (verified) Patient Active Problem List   Diagnosis Date Noted  . Left kidney mass 04/06/2018  . FHx: heart disease 12/28/2017  . Osteopenia 06/17/2017  . Dural arteriovenous fistula 03/05/2017  . Genetic testing 09/19/2015  . BRCA2 positive 09/19/2015  . Breast cancer of upper-outer quadrant of left female breast ([place]) 08/21/2015  . Medication management 06/07/2015  . HTN (hypertension) 06/07/2015  . Other abnormal glucose (hx of prediabetes) 11/16/2013  . Allergic rhinitis   . Hyperlipidemia   . Lumbar degenerative disc disease   . Vitamin D deficiency   . Aneurysm of splenic artery ([place]) 01/19/2012    Screening Tests Immunization History  Administered Date(s)  Administered  . DT (Pediatric) 01/22/2015  . Influenza Split 06/08/2014, 06/02/2016  . Influenza, High Dose Seasonal PF 06/08/2014, 05/27/2017, 06/01/2018, 06/02/2019  . Influenza-Unspecified 06/14/2013, 06/04/2015, 05/27/2017  . Meningococcal Conjugate 09/02/2011  . Moderna SARS-COVID-2 Vaccination 10/17/2019  . PPD Test 01/18/2014  . Pneumococcal Conjugate-13 12/31/2011, 06/17/2017  . Pneumococcal Polysaccharide-23 09/02/2011  . Pneumococcal-Unspecified 09/22/1995, 09/02/2011  . Td 11/27/1994, 04/29/2004, 01/22/2015     UTD on vaccines  Preventative care: Last colonoscopy: 2013 DEXA 09/2019- osteoporosis -2.5- started on fosamax MGM 09/2019 PAP 03/2019 at GYN Echo 2017 US carotid 11/2016 [place] AB 2013  Follows Dr. Marvel [facility]   Names of Other Physician/Practitioners you currently use: 1. [facility] here for primary care 2. Dr. Kathrin Penner, eye doctor, last visit q 6 months, wears glasses  3. Dr. Jason Coop, dentist, last visit 2020 4. Dr. Hardie Shackleton did dental implant.   Patient Care Team: Unk Pinto, MD as PCP - General (Internal Medicine) Serafina Mitchell, MD as Consulting Physician (Vascular Surgery) Carol Ada, MD as Consulting  Physician (Gastroenterology) Martinique, Amy, MD as Consulting Physician (Dermatology) Nicholas Lose, MD as Consulting Physician (Hematology and Oncology) Shon Hough, MD as Consulting Physician (Ophthalmology) Paula Compton, MD as Consulting Physician (Obstetrics and Gynecology)  History reviewed: allergies, current medications, past family history, past medical history, past social history, past surgical history and problem list Allergies Allergies  Allergen Reactions  . Lescol [Fluvastatin Sodium] Other (See Comments)    Myalgia  . Lipitor [Atorvastatin] Other (See Comments)    Increased LFT's  . Vytorin [Ezetimibe-Simvastatin] Other (See Comments)    myalgia    SURGICAL  HISTORY She  has a past surgical history that includes Tonsillectomy; Spine surgery (1998); Spine surgery (2001, 2010); Cervical disc surgery (1998); Lumbar disc surgery (2001, 2010); splenic aneurysm (02/10/2012); Colonoscopy (Jan. 7, 2014); visceral angiogram (N/A, 02/10/2012); Breast lumpectomy with radioactive seed and sentinel lymph node biopsy (Left, 08/29/2015); Re-excision of breast lumpectomy (Left, 09/07/2015); Portacath placement (Bilateral, 10/09/2015); Portacath removal; Laparoscopic bilateral salpingo oophorectomy (Bilateral, 05/16/2016); IR ANGIO INTRA EXTRACRAN SEL INTERNAL CAROTID BILAT MOD SED (02/03/2017); IR NEURO EACH ADD'L AFTER BASIC UNI RIGHT (MS) (02/03/2017); IR ANGIO VERTEBRAL SEL VERTEBRAL BILAT MOD SED (02/03/2017); IR ANGIO EXTERNAL CAROTID SEL EXT CAROTID BILAT MOD SED (02/03/2017); Radiology with anesthesia (N/A, 03/05/2017); IR NEURO EACH ADD'L AFTER BASIC UNI RIGHT (MS) (03/05/2017); IR Angiogram Follow Up Study (03/05/2017); IR Transcath/Emboliz (03/05/2017); IR ANGIO INTRA EXTRACRAN SEL COM CAROTID INNOMINATE UNI R MOD SED (03/05/2017); IR ANGIO EXTERNAL CAROTID SEL EXT CAROTID UNI R MOD SED (03/05/2017); and Breast lumpectomy (Left, 2016).   FAMILY HISTORY Her family history includes Breast cancer in her cousin and maternal grandmother; Colon cancer in her maternal uncle; Deep vein thrombosis in her mother; Diverticulitis in her mother; Heart Problems in her paternal grandfather; Heart attack in her father, paternal aunt, paternal grandmother, and paternal uncle; Heart defect in her maternal aunt; Hyperlipidemia in her mother; Hypertension in her father and mother; Leukemia in her maternal aunt; Lung cancer in her maternal uncle; Osteoporosis in her mother; Other in her father, mother, and sister; Pancreatic cancer in her maternal uncle; Stroke in her father, maternal grandfather, mother, paternal aunt, paternal grandmother, and paternal uncle; Vasculitis in her sister.   SOCIAL HISTORY She  reports  that she has never smoked. She has never used smokeless tobacco. She reports current alcohol use. She reports that she does not use drugs.   Review of Systems  Constitutional: Negative for chills, fever, malaise/fatigue and weight loss.  HENT: Negative for congestion, ear pain, nosebleeds and sore throat.   Eyes: Negative.   Respiratory: Negative for cough, shortness of breath and wheezing.   Cardiovascular: Negative for chest pain, palpitations and leg swelling.  Gastrointestinal: Positive for heartburn (occasionally). Negative for blood in stool, constipation, diarrhea, melena, nausea and vomiting.  Genitourinary: Negative for dysuria, frequency and urgency.  Musculoskeletal: Negative.   Skin: Negative.   Neurological: Negative for dizziness, sensory change, loss of consciousness and headaches.  Psychiatric/Behavioral: Negative for depression. The patient is not nervous/anxious and does not have insomnia.      Objective:     BP 136/82   Pulse 67   Temp (!) 97.3 F (36.3 C)   Ht 5' 5" (1.651 m)   Wt 159 lb (72.1 kg)   SpO2 98%   BMI 26.46 kg/m   General Appearance: Well nourished, alert, WD/WN, female and in no apparent distress. Eyes: PERRLA, EOMs, conjunctiva no swelling or erythema, normal fundi and vessels. Sinuses: No frontal/maxillary tenderness ENT/Mouth: EACs patent /  TMs  nl. Nares clear without erythema, swelling, mucoid exudates. Oral hygiene is good. No erythema, swelling, or exudate. Tongue normal, non-obstructing. Tonsils not swollen or erythematous. Hearing normal.  Neck: Supple, thyroid normal. No bruits, nodes or JVD. Respiratory: Respiratory effort normal.  BS equal and clear bilateral without rales, rhonci, wheezing or stridor. Cardio: Heart sounds are normal with regular rate and rhythm and no murmurs, rubs or gallops. Peripheral pulses are normal and equal bilaterally without edema. No aortic or femoral bruits. Chest: symmetric with normal excursions and  percussion. Breasts: Symmetric, without lumps, nipple discharge, retractions, or fibrocystic changes.  Abdomen: Flat, soft  with nl bowel sounds. Nontender, no guarding, rebound, hernias, masses, or organomegaly.  Lymphatics: Non tender without lymphadenopathy.  Genitourinary: Deferred to ObGyn Musculoskeletal: Full ROM all peripheral extremities, joint stability, 5/5 strength, and normal gait. Skin: Warm and dry without rashes, lesions, cyanosis, clubbing or  ecchymosis. Multiple seborrheic keratosis across the chest and the abdomen. Neuro: Cranial nerves intact, reflexes equal bilaterally. Normal muscle tone, no cerebellar symptoms. Sensation intact.  Pysch: Alert and oriented X 3, normal affect, Insight and Judgment appropriate.   EKG: WNL, no ST changes  Vicie Mutters, PA-C   11/02/2019

## 2019-11-02 ENCOUNTER — Ambulatory Visit (INDEPENDENT_AMBULATORY_CARE_PROVIDER_SITE_OTHER): Payer: Medicare Other | Admitting: Physician Assistant

## 2019-11-02 ENCOUNTER — Other Ambulatory Visit: Payer: Self-pay

## 2019-11-02 ENCOUNTER — Encounter: Payer: Self-pay | Admitting: Physician Assistant

## 2019-11-02 VITALS — BP 136/82 | HR 67 | Temp 97.3°F | Ht 65.0 in | Wt 159.0 lb

## 2019-11-02 DIAGNOSIS — Z170 Estrogen receptor positive status [ER+]: Secondary | ICD-10-CM

## 2019-11-02 DIAGNOSIS — R7309 Other abnormal glucose: Secondary | ICD-10-CM | POA: Diagnosis not present

## 2019-11-02 DIAGNOSIS — E559 Vitamin D deficiency, unspecified: Secondary | ICD-10-CM | POA: Diagnosis not present

## 2019-11-02 DIAGNOSIS — I10 Essential (primary) hypertension: Secondary | ICD-10-CM

## 2019-11-02 DIAGNOSIS — C50412 Malignant neoplasm of upper-outer quadrant of left female breast: Secondary | ICD-10-CM

## 2019-11-02 DIAGNOSIS — Z79899 Other long term (current) drug therapy: Secondary | ICD-10-CM | POA: Diagnosis not present

## 2019-11-02 DIAGNOSIS — M818 Other osteoporosis without current pathological fracture: Secondary | ICD-10-CM

## 2019-11-02 DIAGNOSIS — Z0001 Encounter for general adult medical examination with abnormal findings: Secondary | ICD-10-CM | POA: Diagnosis not present

## 2019-11-02 DIAGNOSIS — E785 Hyperlipidemia, unspecified: Secondary | ICD-10-CM | POA: Diagnosis not present

## 2019-11-02 DIAGNOSIS — Z1509 Genetic susceptibility to other malignant neoplasm: Secondary | ICD-10-CM

## 2019-11-02 DIAGNOSIS — N2889 Other specified disorders of kidney and ureter: Secondary | ICD-10-CM

## 2019-11-02 DIAGNOSIS — Z136 Encounter for screening for cardiovascular disorders: Secondary | ICD-10-CM | POA: Diagnosis not present

## 2019-11-02 DIAGNOSIS — I728 Aneurysm of other specified arteries: Secondary | ICD-10-CM

## 2019-11-02 DIAGNOSIS — I671 Cerebral aneurysm, nonruptured: Secondary | ICD-10-CM

## 2019-11-02 DIAGNOSIS — Z1501 Genetic susceptibility to malignant neoplasm of breast: Secondary | ICD-10-CM

## 2019-11-02 NOTE — Patient Instructions (Addendum)
May want to get a cardiac calcium score, this will help [place] quantify how much calcification if any you have in your coronary arteries and can help [place] decide if we need to do aggressive medical management like cholesterol medication.   I think you would benefit from a PCSK9 inhibitor and you have familial hypercholesterolemia  It will be done at [facility] on [address] in [place] It will be 99 dollars self pay.  You can call [phone] to schedule this, just given them your name and date of birth.    Familial Hypercholesterolemia Familial hypercholesterolemia (FH) is a genetic disorder that causes a very high level of LDL (low-density lipoprotein) cholesterol. Cholesterol is a waxy, fat-like substance that your body needs to build cells. Your body makes all the cholesterol it needs in the liver and removes extra (excess) cholesterol from the blood as needed. Excess cholesterol comes from food that you eat. In people who have FH, the body is not able to remove LDL cholesterol from the blood as it should. A high level of LDL cholesterol puts you at higher risk for narrowing and hardening of your arteries (atherosclerosis) at an early age. This raises your risk for heart disease and stroke. What are the causes? FH is passed from parent to child (inherited). FH is caused by an inherited gene defect (genetic mutation) that makes it hard for the liver to remove LDL cholesterol from the blood. The gene may be inherited from one parent or both parents. What increases the risk? You may be at higher risk for FH if:  You have a family history of the condition. If both parents carry the genetic mutation, their children are at higher risk for a more severe form of FH, with symptoms that start at an earlier age. What are the signs or symptoms? You may have a high level of LDL cholesterol before you develop symptoms. Symptoms of FH may include:  Cholesterol nodules (xanthomas) on the cords of tissue that  connect muscles to bones (tendons). Xanthomas often form on the long tendon at the back of the ankle (Achilles tendon) or on the tendons on the back of the hands.  Cholesterol deposits (xanthelasmas) under the skin of the eyelids.  A gray or blue ring around the white part of the eye (corneal arcus). Complications of FH can occur due to atherosclerosis. Atherosclerosis may cause damage to an area of the body that is not getting enough blood. Complications of FH may include:  Chest pain (angina) and shortness of breath due to narrowed or blocked arteries in the heart (coronary artery disease).  Pain and cramping in the back of the lower legs (calves) when walking (claudication).  Interruption in blood flow to the brain (stroke). This may cause: ? Loss of balance. ? Vision loss. ? Sudden weakness or numbness on one side of the body. How is this diagnosed? This condition may be diagnosed based on:  Your symptoms.  Your medical history, including any family history of FH or early coronary heart disease.  A physical exam.  A blood test to check for the genetic mutation that causes FH. Your family members may also be tested. How is this treated? There is no cure for FH, but treatment can lower LDL cholesterol levels and lower your risk for heart attack or stroke. Treatment should be started as soon as you are diagnosed. Treatment may include:  A type of medicine that lowers your cholesterol (statin). If statins do not help, your health  care provider may try other kinds of cholesterol-lowering medicines. The exact combination of medicines depends on the severity of your symptoms.  A procedure to filter LDL from your blood (apheresis). You may need this treatment if you have a severe form of FH.  Making lifestyle changes that are healthy for your heart, such as lowering the amount of fat and cholesterol in your diet. Follow these instructions at home: Lifestyle   Lose weight, if directed  by your health care provider.  Follow instructions from your health care provider about eating a healthy diet. Your health care provider may recommend: ? Working with a diet and nutrition specialist (dietitian), who can help you make a healthy eating plan and help you maintain a healthy weight. ? Eating less fat and cholesterol. Avoid fatty meats, fried foods, and whole-fat dairy. ? Eating more vegetables, fruits, and whole grains. ? Limiting your intake of alcohol.  Be physically active. Ask your health care provider what type of exercise is best for you.  Do not use any products that contain nicotine or tobacco, such as cigarettes and e-cigarettes. If you need help quitting, ask your health care provider.  Work with your health care provider to manage any other conditions you have, such as high blood pressure (hypertension) or diabetes. These conditions affect your heart. General instructions  Take over-the-counter and prescription medicines only as told by your health care provider.  Keep all follow-up visits as told by your health care provider. This is important. Contact a health care provider if:  You have pain or cramps in your calf when you walk. Get help right away if:   You have sudden, unexplained discomfort in your chest, arms, back, neck, jaw, or upper body.  You have trouble breathing.  You have a sudden, severe headache with no known cause.  You have any symptoms of a stroke. "BE FAST" is an easy way to remember the main warning signs of a stroke: ? B - Balance. Signs are dizziness, sudden trouble walking, or loss of balance. ? E - Eyes. Signs are trouble seeing or a sudden change in vision. ? F - Face. Signs are sudden weakness or numbness of the face, or the face or eyelid drooping on one side. ? A - Arms. Signs are weakness or numbness in an arm. This happens suddenly and usually on one side of the body. ? S - Speech. Signs are sudden trouble speaking, slurred  speech, or trouble understanding what people say. ? T - Time. Time to call emergency services. Write down what time symptoms started. These symptoms may represent a serious problem that is an emergency. Do not wait to see if the symptoms will go away. Get medical help right away. Call your local emergency services (911 in the [place] Do not drive yourself to the hospital. Summary  Familial hypercholesterolemia (FH) is a genetic disorder that causes a very high level of LDL (low-density lipoprotein) cholesterol.  FH increases your risk for coronary heart disease and stroke at an early age.  Treatment for FH should be started as soon as you are diagnosed with the condition. Treatment is aimed at lowering your risk for complications.  Follow instructions from your health care provider about eating a healthy diet. Your health care provider may recommend eating less fat and cholesterol. This information is not intended to replace advice given to you by your health care provider. Make sure you discuss any questions you have with your health care provider. Document Revised:  07/31/2017 Document Reviewed: 01/29/2017 Elsevier Patient Education  [place]  Being a woman you may not have the typical symptoms of a heart attack.  You may not have any pain OR you may have atypical pain such as jaw pain, upper back pain, arm pain, "my bra feels to tight" and you will often have symptoms with it like below.  Symptoms for a heart attack will likely occur when you exert your self or exercise and include: Shortness of breath Sweating Nausea Dizziness Fast or irregular heart beats Fatigue   It makes me feel better if my patients get their heart rate up with exercise once or twice a week and pay close attention to your body. If there is ANY change in your exercise capacity or if you have symptoms above, please STOP and call 911 or call to come to the office.   Here is some  information to help you keep your heart healthy: Move it! - Aim for 30 mins of activity every day. Take it slowly at first. Talk to [place] before starting any new exercise program.   Lose it.  -Body Mass Index (BMI) can indicate if you need to lose weight. A healthy range is 18.5-24.9. For a BMI calculator, go to [employer]  Waist Management -Excess abdominal fat is a risk factor for heart disease, diabetes, asthma, stroke and more. Ideal waist circumference is less than 35" for women and less than 40" for men.   Eat Right -focus on fruits, vegetables, whole grains, and meals you make yourself. Avoid foods with trans fat and high sugar/sodium content.   Snooze or Snore? - Loud snoring can be a sign of sleep apnea, a significant risk factor for high blood pressure, heart attach, stroke, and heart arrhythmias.  Kick the habit -Quit Smoking! Avoid second hand smoke. A single cigarette raises your blood pressure for 20 mins and increases the risk of heart attack and stroke for the next 24 hours.   Are Aspirin and Supplements right for you? -Add ENTERIC COATED low dose 81 mg Aspirin daily OR can do every other day if you have easy bruising to protect your heart and head. As well as to reduce risk of Colon Cancer by 20 %, Skin Cancer by 26 % , Melanoma by 46% and Pancreatic cancer by 60%  Say "No to Stress -There may be little you can do about problems that cause stress. However, techniques such as long walks, meditation, and exercise can help you manage it.   Start Now! - Make changes one at a time and set reasonable goals to increase your likelihood of success.

## 2019-11-07 ENCOUNTER — Encounter: Payer: Self-pay | Admitting: Internal Medicine

## 2019-11-07 ENCOUNTER — Ambulatory Visit (INDEPENDENT_AMBULATORY_CARE_PROVIDER_SITE_OTHER): Payer: Medicare Other | Admitting: Internal Medicine

## 2019-11-07 ENCOUNTER — Other Ambulatory Visit: Payer: Self-pay

## 2019-11-07 VITALS — BP 126/78 | HR 80 | Temp 96.8°F | Ht 65.0 in | Wt 154.0 lb

## 2019-11-07 DIAGNOSIS — E782 Mixed hyperlipidemia: Secondary | ICD-10-CM

## 2019-11-07 DIAGNOSIS — Z8249 Family history of ischemic heart disease and other diseases of the circulatory system: Secondary | ICD-10-CM | POA: Diagnosis not present

## 2019-11-07 DIAGNOSIS — T466X5A Adverse effect of antihyperlipidemic and antiarteriosclerotic drugs, initial encounter: Secondary | ICD-10-CM | POA: Diagnosis not present

## 2019-11-07 DIAGNOSIS — Z136 Encounter for screening for cardiovascular disorders: Secondary | ICD-10-CM

## 2019-11-07 DIAGNOSIS — M791 Myalgia, unspecified site: Secondary | ICD-10-CM

## 2019-11-07 NOTE — Progress Notes (Signed)
LIPID CLINIC CONSULT NOTE  Chief Complaint:  Manage dyslipidemia  Primary Care Physician: Unk Pinto, MD  Primary Cardiologist:  No primary care provider on file.  HPI:  Jacqueline Jenkins is a 69 y.o. female who is being seen today for the evaluation of dyslipidemia at the request of Liane Comber, NP.  This is a pleasant 69 year old female with a history of breast cancer and BRCA2 mutation on anastrozole, who is referred for evaluation and management of dyslipidemia.  She had repeat lipids 5 days ago showing total cholesterol 272, HDL 58, triglycerides 174 and LDL of 182.  Her LDL has been well over 200 in the past.  She has been on intermittent therapy in the past including with Vytorin and Lescol which cause myalgias and atorvastatin however causing significant elevation in her liver enzymes that led to discontinuation of the medication.  She is noted to have hepatic steatosis by imaging studies however no prior studies has shown any coronary artery calcification.  She had carotid Dopplers in 2018 which showed minimal carotid irregularities.  She also has a history of a splenic artery aneurysm however no evidence of any mesenteric or abdominal aortic atherosclerosis.  There does appear to be a family history of heart disease both in her parents including on her father's side and her uncles on her father side.  She does not have diabetes but does have hypertension which has been well controlled.  He did have an echocardiogram as part of her chemotherapy in February 2017 which showed a normal EF 55 to 60%.  PMHx:  Past Medical History:  Diagnosis Date  . Allergic rhinitis, cause unspecified   . Anemia    history of anemia while teenager  . BRCA2 positive   . Breast cancer ([place]) 2016   Left Breast Cancer  . Breast cancer of upper-outer quadrant of left female breast ([place])    chemo complete 01/2016, radiation 04/2016  . DDD (degenerative disc disease)   . Diverticulitis   . Family  history of breast cancer 08/30/2015   Dx. In maternal grandmother in her 43s-40; dx. In maternal first cousin in her 27s-60s   . Family history of colon cancer 08/30/2015   Dx. In maternal uncle in his 28s   . Hyperlipidemia   . Hypertension   . Left kidney mass 04/06/2018   Mild growth from Ct 2013 to 04/06/2018; Renal US 2020 no growth- will not follow up  . Neuropathy   . Osteopenia 12/2011   Spine T -0.4, Femur -2.1  . Personal history of chemotherapy 2016   Left Breast Cancer  . Personal history of radiation therapy 2016   Left Breast Cancer  . PONV (postoperative nausea and vomiting)   . Splenic artery aneurysm ([place]) 2013   COILS DONE  . Vitamin D deficiency     Past Surgical History:  Procedure Laterality Date  . BREAST LUMPECTOMY Left 2016  . BREAST LUMPECTOMY WITH RADIOACTIVE SEED AND SENTINEL LYMPH NODE BIOPSY Left 08/29/2015   Procedure: LEFT BREAST LUMPECTOMY WITH RADIOACTIVE SEED WITH AXILLARY SENTINEL LYMPH NODE BIOPSY;  Surgeon: Excell Seltzer, MD;  Location: [facility];  Service: General;  Laterality: Left;  . [place]  . COLONOSCOPY  Jan. 7, 2014  . IR ANGIO EXTERNAL CAROTID SEL EXT CAROTID BILAT MOD SED  02/03/2017  . IR ANGIO EXTERNAL CAROTID SEL EXT CAROTID UNI R MOD SED  03/05/2017  . IR ANGIO INTRA EXTRACRAN SEL COM CAROTID INNOMINATE  UNI R MOD SED  03/05/2017  . IR ANGIO INTRA EXTRACRAN SEL INTERNAL CAROTID BILAT MOD SED  02/03/2017  . IR ANGIO VERTEBRAL SEL VERTEBRAL BILAT MOD SED  02/03/2017  . IR ANGIOGRAM FOLLOW UP STUDY  03/05/2017  . IR NEURO EACH ADD'L AFTER BASIC UNI RIGHT (MS)  02/03/2017  . IR NEURO EACH ADD'L AFTER BASIC UNI RIGHT (MS)  03/05/2017  . IR TRANSCATH/EMBOLIZ  03/05/2017  . LAPAROSCOPIC BILATERAL SALPINGO OOPHERECTOMY Bilateral 05/16/2016   Procedure: LAPAROSCOPIC BILATERAL SALPINGO OOPHORECTOMY;  Surgeon: Paula Compton, MD;  Location: [facility];  Service: Gynecology;  Laterality: Bilateral;  . [place] SURGERY   2001, 2010  . PORTACATH PLACEMENT Bilateral 10/09/2015   Procedure: INSERTION PORT-A-CATH;  Surgeon: Excell Seltzer, MD;  Location: [facility];  Service: General;  Laterality: Bilateral;  . Portacath removal    . RADIOLOGY WITH ANESTHESIA N/A 03/05/2017   Procedure: ARTERIOGRAM ONYX EMBOLIZATION OF FISTULA;  Surgeon: Consuella Lose, MD;  Location: [facility];  Service: Radiology;  Laterality: N/A;  . RE-EXCISION OF BREAST LUMPECTOMY Left 09/07/2015   Procedure: RE-EXCISION OF LEFT BREAST LUMPECTOMY;  Surgeon: Excell Seltzer, MD;  Location: [facility];  Service: General;  Laterality: Left;  . SPINE SURGERY  1998   cervical diskectomy  . SPINE SURGERY  2001, 2010   Lumbar diskectomy  . splenic aneurysm  02/10/2012   Aneurysm of splenic artery  . TONSILLECTOMY    . VISCERAL ANGIOGRAM N/A 02/10/2012   Procedure: VISCERAL ANGIOGRAM;  Surgeon: Serafina Mitchell, MD;  Location: [facility] CATH LAB;  Service: Cardiovascular;  Laterality: N/A;    FAMHx:  Family History  Problem Relation Age of Onset  . Stroke Mother   . Osteoporosis Mother   . Hyperlipidemia Mother   . Hypertension Mother   . Other Mother        varicose veins  . Deep vein thrombosis Mother   . Diverticulitis Mother   . Breast cancer Maternal Grandmother        dx. 30s-40s; when pt's mother was 76 years old  . Other Father        bleeding problems  . Hypertension Father   . Heart attack Father   . Stroke Father   . Other Sister        one sister had a hysterectomy for fibroids  . Vasculitis Sister   . Leukemia Maternal Aunt        dx. 50s-60s  . Pancreatic cancer Maternal Uncle        dx. late 60s-early 70s; (x2 maternal uncles)  . Heart attack Paternal Aunt   . Stroke Paternal Aunt   . Stroke Maternal Grandfather   . Heart attack Paternal Grandmother   . Stroke Paternal Grandmother   . Heart Problems Paternal Grandfather   . Colon cancer Maternal Uncle        dx. late 23s  . Heart defect Maternal Aunt         possible  . Breast cancer Cousin        dx. 50s-60s; maternal first  . Heart attack Paternal Uncle   . Stroke Paternal Uncle   . Lung cancer Maternal Uncle        treated at [facility]; d. 55s    SOCHx:   reports that she has never smoked. She has never used smokeless tobacco. She reports current alcohol use. She reports that she does not use drugs.  ALLERGIES:  Allergies  Allergen Reactions  . Lescol [Fluvastatin Sodium] Other (  See Comments)    Myalgia  . Lipitor [Atorvastatin] Other (See Comments)    Increased LFT's  . Vytorin [Ezetimibe-Simvastatin] Other (See Comments)    myalgia    ROS: Pertinent items noted in HPI and remainder of comprehensive ROS otherwise negative.  HOME MEDS: Current Outpatient Medications on File Prior to Visit  Medication Sig Dispense Refill  . alendronate (FOSAMAX) 70 MG tablet Take 1 tablet (70 mg total) by mouth once a week. Take with a full glass of water on an empty stomach. 12 tablet 3  . anastrozole (ARIMIDEX) 1 MG tablet Take 1 tablet (1 mg total) by mouth daily at 3 pm. 90 tablet 3  . benazepril-hydrochlorthiazide (LOTENSIN HCT) 20-12.5 MG tablet Take 1 tablet Daily for BP 90 tablet 3  . Calcium Carb-Cholecalciferol (CALCIUM 1000 + D PO) Take 2 tablets by mouth daily. Gummies    . naproxen sodium (ANAPROX) 220 MG tablet Take 220 mg by mouth daily as needed (pain).    Marland Kitchen VITAMIN D PO Take 5,000 Units by mouth. Takes 1 capsule every other day.     No current facility-administered medications on file prior to visit.    LABS/IMAGING: No results found for this or any previous visit (from the past 48 hour(s)). No results found.  LIPID PANEL:    Component Value Date/Time   CHOL 272 (H) 11/02/2019 1558   TRIG 174 (H) 11/02/2019 1558   HDL 58 11/02/2019 1558   CHOLHDL 4.7 11/02/2019 1558   VLDL 47 (H) 12/11/2016 1708   LDLCALC 182 (H) 11/02/2019 1558    WEIGHTS: Wt Readings from Last 3 Encounters:  11/07/19 154 lb (69.9 kg)  11/02/19 159  lb (72.1 kg)  08/01/19 157 lb (71.2 kg)    VITALS: BP 126/78 (BP Location: Left Arm, Patient Position: Sitting, Cuff Size: Normal)   Pulse 80   Temp (!) 96.8 F (36 C)   Ht 5' 5"  (1.651 m)   Wt 154 lb (69.9 kg)   BMI 25.63 kg/m   EXAM: General appearance: alert and no distress Neck: no carotid bruit, no JVD and thyroid not enlarged, symmetric, no tenderness/mass/nodules Lungs: clear to auscultation bilaterally Heart: regular rate and rhythm Abdomen: soft, non-tender; bowel sounds normal; no masses,  no organomegaly Extremities: extremities normal, atraumatic, no cyanosis or edema Pulses: 2+ and symmetric Skin: Skin color, texture, turgor normal. No rashes or lesions Neurologic: Grossly normal Psych: Pleasant  EKG: Deferred  ASSESSMENT: 1. Mixed dyslipidemia, ?FH 2. Statin intolerant-myalgias 3. Family history of coronary disease  PLAN: 1.   Ms. Sealey has a mixed dyslipidemia however it is unclear whether she has any familial hyperlipidemia.  Certainly she has not had a prior cardiovascular event which makes it less likely that this is FH.  There is however family history of heart disease seemingly on her father's side.  She has unfortunately been intolerant to statins, but has not tried Zetia monotherapy.  Given her high LDL cholesterol, however she may need a PCSK9 inhibitor if there was some more significant evidence of cardiovascular disease.  I would like to get a calcium score to establish a baseline and could set targets of therapy based on that.  She is agreeable with that plan and will follow up with a virtual visit.  Thanks again for the kind referral.  Pixie Casino, MD, FACC, [place] Director of the Advanced Lipid Disorders &  Cardiovascular Risk Reduction Clinic Diplomate of the [employer] of  Clinical Lipidology Attending Cardiologist  Direct Dial: [phone]  Fax: [phone]  Website:   [email or web address] 11/07/2019, 3:51 PM

## 2019-11-07 NOTE — Patient Instructions (Signed)
Medication Instructions:  No Changes *If you need a refill on your cardiac medications before your next appointment, please call your pharmacy*  Lab Work: None If you have labs (blood work) drawn today and your tests are completely normal, you will receive your results only by: Marland Kitchen MyChart Message (if you have MyChart) OR . A paper copy in the mail If you have any lab test that is abnormal or we need to change your treatment, we will call you to review the results.  Testing/Procedures: CT Calcium Score  Follow-Up: At [facility], you and your health needs are our priority.  As part of our continuing mission to provide you with exceptional heart care, we have created designated Provider Care Teams.  These Care Teams include your primary Cardiologist (physician) and Advanced Practice Providers (APPs -  Physician Assistants and Nurse Practitioners) who all work together to provide you with the care you need, when you need it.  We recommend signing up for the patient portal called "MyChart".  Sign up information is provided on this After Visit Summary.  MyChart is used to connect with patients for Virtual Visits (Telemedicine).  Patients are able to view lab/test results, encounter notes, upcoming appointments, etc.  Non-urgent messages can be sent to your provider as well.   To learn more about what you can do with MyChart, go to [email or web address].    Your next appointment:   1 month(s) - Lipid Clinic  The format for your next appointment:   Virtual  Provider:   K. Mali Hilty, MD   Other Instructions  Dr. Debara Pickett has ordered a CT coronary calcium score. This test is done at [address]. This is $150 out of pocket.  Coronary CalciumScan A coronary calcium scan is an imaging test used to look for deposits of calcium and other fatty materials (plaques) in the inner lining of the blood vessels of the heart (coronary arteries). These deposits of calcium and plaques  can partly clog and narrow the coronary arteries without producing any symptoms or warning signs. This puts a person at risk for a heart attack. This test can detect these deposits before symptoms develop. Tell a health care provider about:  Any allergies you have.  All medicines you are taking, including vitamins, herbs, eye drops, creams, and over-the-counter medicines.  Any problems you or family members have had with anesthetic medicines.  Any blood disorders you have.  Any surgeries you have had.  Any medical conditions you have.  Whether you are pregnant or may be pregnant. What are the risks? Generally, this is a safe procedure. However, problems may occur, including:  Harm to a pregnant woman and her unborn baby. This test involves the use of radiation. Radiation exposure can be dangerous to a pregnant woman and her unborn baby. If you are pregnant, you generally should not have this procedure done.  Slight increase in the risk of cancer. This is because of the radiation involved in the test. What happens before the procedure? No preparation is needed for this procedure. What happens during the procedure?  You will undress and remove any jewelry around your neck or chest.  You will put on a hospital gown.  Sticky electrodes will be placed on your chest. The electrodes will be connected to an electrocardiogram (ECG) machine to record a tracing of the electrical activity of your heart.  A CT scanner will take pictures of your heart. During this time, you will be asked to  lie still and hold your breath for 2-3 seconds while a picture of your heart is being taken. The procedure may vary among health care providers and hospitals. What happens after the procedure?  You can get dressed.  You can return to your normal activities.  It is up to you to get the results of your test. Ask your health care provider, or the department that is doing the test, when your results will be  ready. Summary  A coronary calcium scan is an imaging test used to look for deposits of calcium and other fatty materials (plaques) in the inner lining of the blood vessels of the heart (coronary arteries).  Generally, this is a safe procedure. Tell your health care provider if you are pregnant or may be pregnant.  No preparation is needed for this procedure.  A CT scanner will take pictures of your heart.  You can return to your normal activities after the scan is done. This information is not intended to replace advice given to you by your health care provider. Make sure you discuss any questions you have with your health care provider. Document Released: 02/14/2008 Document Revised: 07/07/2016 Document Reviewed: 07/07/2016 Elsevier Interactive Patient Education  2017 [employer].

## 2019-11-09 ENCOUNTER — Other Ambulatory Visit: Payer: Self-pay

## 2019-11-09 ENCOUNTER — Ambulatory Visit (INDEPENDENT_AMBULATORY_CARE_PROVIDER_SITE_OTHER): Payer: Medicare Other | Admitting: *Deleted

## 2019-11-09 VITALS — BP 138/84 | HR 64 | Temp 97.7°F | Resp 16 | Wt 160.0 lb

## 2019-11-09 DIAGNOSIS — N179 Acute kidney failure, unspecified: Secondary | ICD-10-CM | POA: Diagnosis not present

## 2019-11-09 DIAGNOSIS — Z79899 Other long term (current) drug therapy: Secondary | ICD-10-CM

## 2019-11-09 NOTE — Progress Notes (Signed)
Patient is here for a NV to recheck a BMET, due to a decrease in her kidney functions. She states she has increased her water intake to approximately 64 ounces. She has not been taking NASIDS, but has continued her Benazepril.

## 2019-11-10 LAB — BASIC METABOLIC PANEL WITHOUT GFR
BUN: 16 mg/dL (ref 7–25)
CO2: 27 mmol/L (ref 20–32)
Calcium: 9.5 mg/dL (ref 8.6–10.4)
Chloride: 95 mmol/L — ABNORMAL LOW (ref 98–110)
Creat: 0.72 mg/dL (ref 0.60–0.93)
GFR, Est African American: 98 mL/min/1.73m2
GFR, Est Non African American: 85 mL/min/1.73m2
Glucose, Bld: 97 mg/dL (ref 65–99)
Potassium: 4.1 mmol/L (ref 3.5–5.3)
Sodium: 132 mmol/L — ABNORMAL LOW (ref 135–146)

## 2019-11-15 ENCOUNTER — Ambulatory Visit: Payer: Medicare Other | Attending: Internal Medicine

## 2019-11-15 ENCOUNTER — Telehealth: Payer: Self-pay

## 2019-11-15 DIAGNOSIS — Z23 Encounter for immunization: Secondary | ICD-10-CM

## 2019-11-15 NOTE — Progress Notes (Signed)
   Covid-19 Vaccination Clinic  Name:  Jacqueline Jenkins    MRN: [id number] DOB: 1950-10-02  11/15/2019  [name] was observed post Covid-19 immunization for 15 minutes without incident. She was provided with Vaccine Information Sheet and instruction to access the V-Safe system.   [name] was instructed to call 911 with any severe reactions post vaccine: Marland Kitchen Difficulty breathing  . Swelling of face and throat  . A fast heartbeat  . A bad rash all over body  . Dizziness and weakness   Immunizations Administered    Name Date Dose VIS Date Route   Moderna COVID-19 Vaccine 11/15/2019 12:44 PM 0.5 mL 08/02/2019 Intramuscular   Manufacturer: Moderna   Lot: [id number]   [id number]

## 2019-11-15 NOTE — Telephone Encounter (Signed)
Pt has been made aware of LAB results via Phone call

## 2019-11-17 ENCOUNTER — Inpatient Hospital Stay: Admission: RE | Admit: 2019-11-17 | Payer: Medicare Other | Source: Ambulatory Visit

## 2019-11-18 LAB — CBC WITH DIFFERENTIAL/PLATELET
Absolute Monocytes: 504 cells/uL (ref 200–950)
Basophils Absolute: 90 cells/uL (ref 0–200)
Basophils Relative: 1.3 %
Eosinophils Absolute: 304 cells/uL (ref 15–500)
Eosinophils Relative: 4.4 %
HCT: 38 % (ref 35.0–45.0)
Hemoglobin: 13.3 g/dL (ref 11.7–15.5)
Lymphs Abs: 1014 cells/uL (ref 850–3900)
MCH: 32 pg (ref 27.0–33.0)
MCHC: 35 g/dL (ref 32.0–36.0)
MCV: 91.6 fL (ref 80.0–100.0)
MPV: 10.4 fL (ref 7.5–12.5)
Monocytes Relative: 7.3 %
Neutro Abs: 4989 cells/uL (ref 1500–7800)
Neutrophils Relative %: 72.3 %
Platelets: 282 10*3/uL (ref 140–400)
RBC: 4.15 10*6/uL (ref 3.80–5.10)
RDW: 12.1 % (ref 11.0–15.0)
Total Lymphocyte: 14.7 %
WBC: 6.9 10*3/uL (ref 3.8–10.8)

## 2019-11-18 LAB — URINALYSIS, ROUTINE W REFLEX MICROSCOPIC
Bilirubin Urine: NEGATIVE
Glucose, UA: NEGATIVE
Hgb urine dipstick: NEGATIVE
Ketones, ur: NEGATIVE
Leukocytes,Ua: NEGATIVE
Nitrite: NEGATIVE
Protein, ur: NEGATIVE
Specific Gravity, Urine: 1.015 (ref 1.001–1.03)
pH: 7 (ref 5.0–8.0)

## 2019-11-18 LAB — COMPLETE METABOLIC PANEL WITH GFR
AG Ratio: 1.7 (calc) (ref 1.0–2.5)
ALT: 20 U/L (ref 6–29)
AST: 22 U/L (ref 10–35)
Albumin: 4.3 g/dL (ref 3.6–5.1)
Alkaline phosphatase (APISO): 67 U/L (ref 37–153)
BUN/Creatinine Ratio: 16 (calc) (ref 6–22)
BUN: 16 mg/dL (ref 7–25)
CO2: 27 mmol/L (ref 20–32)
Calcium: 9.6 mg/dL (ref 8.6–10.4)
Chloride: 101 mmol/L (ref 98–110)
Creat: 0.98 mg/dL — ABNORMAL HIGH (ref 0.60–0.93)
GFR, Est African American: 68 mL/min/{1.73_m2} (ref >=60)
GFR, Est Non African American: 58 mL/min/{1.73_m2} — ABNORMAL LOW (ref >=60)
Globulin: 2.5 g/dL (calc) (ref 1.9–3.7)
Glucose, Bld: 86 mg/dL (ref 65–99)
Potassium: 4 mmol/L (ref 3.5–5.3)
Sodium: 137 mmol/L (ref 135–146)
Total Bilirubin: 0.3 mg/dL (ref 0.2–1.2)
Total Protein: 6.8 g/dL (ref 6.1–8.1)

## 2019-11-18 LAB — TSH: TSH: 0.88 mIU/L (ref 0.40–4.50)

## 2019-11-18 LAB — MICROALBUMIN / CREATININE URINE RATIO
Creatinine, Urine: 77 mg/dL (ref 20–275)
Microalb Creat Ratio: 4 mcg/mg creat (ref <30)
Microalb, Ur: 0.3 mg/dL

## 2019-11-18 LAB — HEMOGLOBIN A1C
Hgb A1c MFr Bld: 5.7 % of total Hgb — ABNORMAL HIGH (ref <5.7)
Mean Plasma Glucose: 117 (calc)
eAG (mmol/L): 6.5 (calc)

## 2019-11-18 LAB — LIPID PANEL
Cholesterol: 272 mg/dL — ABNORMAL HIGH (ref <200)
HDL: 58 mg/dL (ref >=50)
LDL Cholesterol (Calc): 182 mg/dL (calc) — ABNORMAL HIGH
Non-HDL Cholesterol (Calc): 214 mg/dL (calc) — ABNORMAL HIGH (ref <130)
Total CHOL/HDL Ratio: 4.7 (calc) (ref <5.0)
Triglycerides: 174 mg/dL — ABNORMAL HIGH (ref <150)

## 2019-11-18 LAB — VITAMIN D 25 HYDROXY (VIT D DEFICIENCY, FRACTURES): Vit D, 25-Hydroxy: 27 ng/mL — ABNORMAL LOW (ref 30–100)

## 2019-11-18 LAB — MAGNESIUM: Magnesium: 1.9 mg/dL (ref 1.5–2.5)

## 2019-11-18 LAB — LIPOPROTEIN A (LPA): Lipoprotein (a): 46 nmol/L (ref <75)

## 2019-11-24 ENCOUNTER — Other Ambulatory Visit: Payer: Self-pay

## 2019-11-24 ENCOUNTER — Ambulatory Visit (INDEPENDENT_AMBULATORY_CARE_PROVIDER_SITE_OTHER)
Admission: RE | Admit: 2019-11-24 | Discharge: 2019-11-24 | Disposition: A | Discharge status: Home / Self Care | Payer: Self-pay | Source: Ambulatory Visit | Attending: Internal Medicine | Admitting: Internal Medicine

## 2019-11-24 DIAGNOSIS — I700 Atherosclerosis of aorta: Secondary | ICD-10-CM | POA: Diagnosis not present

## 2019-11-24 DIAGNOSIS — Z8249 Family history of ischemic heart disease and other diseases of the circulatory system: Secondary | ICD-10-CM

## 2019-11-24 DIAGNOSIS — Z9189 Other specified personal risk factors, not elsewhere classified: Secondary | ICD-10-CM | POA: Diagnosis not present

## 2019-12-09 ENCOUNTER — Telehealth: Payer: Self-pay | Admitting: Internal Medicine

## 2019-12-09 ENCOUNTER — Encounter: Payer: Self-pay | Admitting: Internal Medicine

## 2019-12-09 ENCOUNTER — Telehealth (INDEPENDENT_AMBULATORY_CARE_PROVIDER_SITE_OTHER): Payer: Medicare Other | Admitting: Internal Medicine

## 2019-12-09 DIAGNOSIS — E7849 Other hyperlipidemia: Secondary | ICD-10-CM | POA: Diagnosis not present

## 2019-12-09 DIAGNOSIS — R931 Abnormal findings on diagnostic imaging of heart and coronary circulation: Secondary | ICD-10-CM | POA: Diagnosis not present

## 2019-12-09 DIAGNOSIS — T466X5A Adverse effect of antihyperlipidemic and antiarteriosclerotic drugs, initial encounter: Secondary | ICD-10-CM

## 2019-12-09 DIAGNOSIS — M791 Myalgia, unspecified site: Secondary | ICD-10-CM | POA: Diagnosis not present

## 2019-12-09 DIAGNOSIS — Z8249 Family history of ischemic heart disease and other diseases of the circulatory system: Secondary | ICD-10-CM | POA: Diagnosis not present

## 2019-12-09 MED ORDER — REPATHA SURECLICK 140 MG/ML ~~LOC~~ SOAJ: 1.0000 | SUBCUTANEOUS | 11 refills | Status: DC

## 2019-12-09 NOTE — Progress Notes (Signed)
Virtual Visit via Telephone Note   This visit type was conducted due to national recommendations for restrictions regarding the COVID-19 Pandemic (e.g. social distancing) in an effort to limit this patient's exposure and mitigate transmission in our community.  Due to her co-morbid illnesses, this patient is at least at moderate risk for complications without adequate follow up.  This format is felt to be most appropriate for this patient at this time.  The patient did not have access to video technology/had technical difficulties with video requiring transitioning to audio format only (telephone).  All issues noted in this document were discussed and addressed.  No physical exam could be performed with this format.  Please refer to the patient's chart for her  consent to telehealth for [facility].   Evaluation Performed:  Telephone follow-up  Date:  12/09/2019   ID:  Jacqueline Jenkins, DOB 11-12-1950, MRN [id number]  Patient Location:  [address] [place] [address]  Provider location:   [address] [place],  [address]  PCP:  Unk Pinto, MD  Cardiologist:  No primary care provider on file. Electrophysiologist:  None   Chief Complaint:  No complaints  History of Present Illness:    Jacqueline Jenkins is a 69 y.o. female who presents via audio/video conferencing for a telehealth visit today.  Jacqueline Jenkins is a 69 y.o. female who is being seen today for the evaluation of dyslipidemia at the request of Jacqueline Comber, NP.  This is a pleasant 69 year old female with a history of breast cancer and BRCA2 mutation on anastrozole, who is referred for evaluation and management of dyslipidemia.  She had repeat lipids 5 days ago showing total cholesterol 272, HDL 58, triglycerides 174 and LDL of 182.  Her LDL has been well over 200 in the past.  She has been on intermittent therapy in the past including with Vytorin and Lescol which cause myalgias and atorvastatin however  causing significant elevation in her liver enzymes that led to discontinuation of the medication.  She is noted to have hepatic steatosis by imaging studies however no prior studies has shown any coronary artery calcification.  She had carotid Dopplers in 2018 which showed minimal carotid irregularities.  She also has a history of a splenic artery aneurysm however no evidence of any mesenteric or abdominal aortic atherosclerosis.  There does appear to be a family history of heart disease both in her parents including on her father's side and her uncles on her father side.  She does not have diabetes but does have hypertension which has been well controlled.  He did have an echocardiogram as part of her chemotherapy in February 2017 which showed a normal EF 55 to 60%  12/09/2019  Karem returns today for telephone follow-up.  She underwent CT calcium scoring which demonstrated a calcium score of 8 however she was also noted to have some aortic atherosclerosis.  While this is a low risk finding it is not normal however she was in the 46 percentile based on age and sex matched controls.  Her lipids still remain very elevated suggestive of a familial hyperlipidemia.  LDL has ranged between 180 and 200.  Unfortunately she was not tolerant of statins and was on monotherapy ezetimibe which caused her headache.  She had myalgias with the statins.  Options therefore for treatment are limited.  The patient does not have symptoms concerning for COVID-19 infection (fever, chills, cough, or new SHORTNESS OF BREATH).  Prior CV studies:   The following studies were reviewed today:  Calcium score, labs  PMHx:  Past Medical History:  Diagnosis Date  . Allergic rhinitis, cause unspecified   . Anemia    history of anemia while teenager  . BRCA2 positive   . Breast cancer ([place]) 2016   Left Breast Cancer  . Breast cancer of upper-outer quadrant of left female breast ([place])    chemo complete 01/2016, radiation 04/2016   . DDD (degenerative disc disease)   . Diverticulitis   . Family history of breast cancer 08/30/2015   Dx. In maternal grandmother in her 72s-40; dx. In maternal first cousin in her 36s-60s   . Family history of colon cancer 08/30/2015   Dx. In maternal uncle in his 50s   . Hyperlipidemia   . Hypertension   . Left kidney mass 04/06/2018   Mild growth from Ct 2013 to 04/06/2018; Renal US 2020 no growth- will not follow up  . Neuropathy   . Osteopenia 12/2011   Spine T -0.4, Femur -2.1  . Personal history of chemotherapy 2016   Left Breast Cancer  . Personal history of radiation therapy 2016   Left Breast Cancer  . PONV (postoperative nausea and vomiting)   . Splenic artery aneurysm ([place]) 2013   COILS DONE  . Vitamin D deficiency     Past Surgical History:  Procedure Laterality Date  . BREAST LUMPECTOMY Left 2016  . BREAST LUMPECTOMY WITH RADIOACTIVE SEED AND SENTINEL LYMPH NODE BIOPSY Left 08/29/2015   Procedure: LEFT BREAST LUMPECTOMY WITH RADIOACTIVE SEED WITH AXILLARY SENTINEL LYMPH NODE BIOPSY;  Surgeon: Excell Seltzer, MD;  Location: [place];  Service: General;  Laterality: Left;  . [place]  . COLONOSCOPY  Jan. 7, 2014  . IR ANGIO EXTERNAL CAROTID SEL EXT CAROTID BILAT MOD SED  02/03/2017  . IR ANGIO EXTERNAL CAROTID SEL EXT CAROTID UNI R MOD SED  03/05/2017  . IR ANGIO INTRA EXTRACRAN SEL COM CAROTID INNOMINATE UNI R MOD SED  03/05/2017  . IR ANGIO INTRA EXTRACRAN SEL INTERNAL CAROTID BILAT MOD SED  02/03/2017  . IR ANGIO VERTEBRAL SEL VERTEBRAL BILAT MOD SED  02/03/2017  . IR ANGIOGRAM FOLLOW UP STUDY  03/05/2017  . IR NEURO EACH ADD'L AFTER BASIC UNI RIGHT (MS)  02/03/2017  . IR NEURO EACH ADD'L AFTER BASIC UNI RIGHT (MS)  03/05/2017  . IR TRANSCATH/EMBOLIZ  03/05/2017  . LAPAROSCOPIC BILATERAL SALPINGO OOPHERECTOMY Bilateral 05/16/2016   Procedure: LAPAROSCOPIC BILATERAL SALPINGO OOPHORECTOMY;  Surgeon: Paula Compton, MD;  Location: [facility];   Service: Gynecology;  Laterality: Bilateral;  . [place] SURGERY  2001, 2010  . PORTACATH PLACEMENT Bilateral 10/09/2015   Procedure: INSERTION PORT-A-CATH;  Surgeon: Excell Seltzer, MD;  Location: [facility];  Service: General;  Laterality: Bilateral;  . Portacath removal    . RADIOLOGY WITH ANESTHESIA N/A 03/05/2017   Procedure: ARTERIOGRAM ONYX EMBOLIZATION OF FISTULA;  Surgeon: Consuella Lose, MD;  Location: [facility];  Service: Radiology;  Laterality: N/A;  . RE-EXCISION OF BREAST LUMPECTOMY Left 09/07/2015   Procedure: RE-EXCISION OF LEFT BREAST LUMPECTOMY;  Surgeon: Excell Seltzer, MD;  Location: [facility];  Service: General;  Laterality: Left;  . SPINE SURGERY  1998   cervical diskectomy  . SPINE SURGERY  2001, 2010   Lumbar diskectomy  . splenic aneurysm  02/10/2012   Aneurysm of splenic artery  . TONSILLECTOMY    . VISCERAL ANGIOGRAM N/A 02/10/2012   Procedure:  VISCERAL ANGIOGRAM;  Surgeon: Serafina Mitchell, MD;  Location[facility];  Service: Cardiovascular;  Laterality: N/A;    FAMHx:  Family History  Problem Relation Age of Onset  . Stroke Mother   . Osteoporosis Mother   . Hyperlipidemia Mother   . Hypertension Mother   . Other Mother        varicose veins  . Deep vein thrombosis Mother   . Diverticulitis Mother   . Breast cancer Maternal Grandmother        dx. 30s-40s; when pt's mother was 38 years old  . Other Father        bleeding problems  . Hypertension Father   . Heart attack Father   . Stroke Father   . Other Sister        one sister had a hysterectomy for fibroids  . Vasculitis Sister   . Leukemia Maternal Aunt        dx. 50s-60s  . Pancreatic cancer Maternal Uncle        dx. late 60s-early 70s; (x2 maternal uncles)  . Heart attack Paternal Aunt   . Stroke Paternal Aunt   . Stroke Maternal Grandfather   . Heart attack Paternal Grandmother   . Stroke Paternal Grandmother   . Heart Problems Paternal Grandfather   . Colon cancer  Maternal Uncle        dx. late 5s  . Heart defect Maternal Aunt        possible  . Breast cancer Cousin        dx. 50s-60s; maternal first  . Heart attack Paternal Uncle   . Stroke Paternal Uncle   . Lung cancer Maternal Uncle        treated at [facility]; d. 8s    SOCHx:   reports that she has never smoked. She has never used smokeless tobacco. She reports current alcohol use. She reports that she does not use drugs.  ALLERGIES:  Allergies  Allergen Reactions  . Lescol [Fluvastatin Sodium] Other (See Comments)    Myalgia  . Lipitor [Atorvastatin] Other (See Comments)    Increased LFT's  . Vytorin [Ezetimibe-Simvastatin] Other (See Comments)    myalgia    MEDS:  Current Meds  Medication Sig  . alendronate (FOSAMAX) 70 MG tablet Take 1 tablet (70 mg total) by mouth once a week. Take with a full glass of water on an empty stomach.  Marland Kitchen anastrozole (ARIMIDEX) 1 MG tablet Take 1 tablet (1 mg total) by mouth daily at 3 pm.  . benazepril-hydrochlorthiazide (LOTENSIN HCT) 20-12.5 MG tablet Take 1 tablet Daily for BP  . Calcium Carb-Cholecalciferol (CALCIUM 1000 + D PO) Take 2 tablets by mouth daily. Gummies  . naproxen sodium (ANAPROX) 220 MG tablet Take 220 mg by mouth daily as needed (pain).  Marland Kitchen VITAMIN D PO Take 5,000 Units by mouth. Takes 1 capsule every other day.     ROS: Pertinent items noted in HPI and remainder of comprehensive ROS otherwise negative.  Labs/Other Tests and Data Reviewed:    Recent Labs: 11/02/2019: ALT 20; Hemoglobin 13.3; Magnesium 1.9; Platelets 282; TSH 0.88 11/09/2019: BUN 16; Creat 0.72; Potassium 4.1; Sodium 132   Recent Lipid Panel Lab Results  Component Value Date/Time   CHOL 272 (H) 11/02/2019 03:58 PM   TRIG 174 (H) 11/02/2019 03:58 PM   HDL 58 11/02/2019 03:58 PM   CHOLHDL 4.7 11/02/2019 03:58 PM   LDLCALC 182 (H) 11/02/2019 03:58 PM    Wt Readings from Last  3 Encounters:  11/09/19 160 lb (72.6 kg)  11/07/19 154 lb (69.9 kg)  11/02/19  159 lb (72.1 kg)     Exam:    Vital Signs:  There were no vitals taken for this visit.   Exam not performed due to telephone visit  ASSESSMENT & PLAN:    1. Possible familial hyperlipidemia 2. CAC score 8 3. Family history of premature coronary disease 4. Statin myalgias, ezetimibe intolerant  Ms. Menard has not been able to tolerate statins or ezetimibe due to side effects.  She does have coronary artery calcification and atherosclerosis of the aorta suggestive of ASCVD.  I suspect she could have a familial hyperlipidemia with LDL cholesterol that has been between 180 and over 200 untreated.  Based on that and her intolerance to medications I think she is a good candidate for PCSK9 inhibitor.  Would recommend Repatha 140 mg every 2 weeks.  After achieving prior authorization, would plan repeat lipids in about 3 months and follow-up with me at that time.  COVID-19 Education: The signs and symptoms of COVID-19 were discussed with the patient and how to seek care for testing (follow up with PCP or arrange E-visit).  The importance of social distancing was discussed today.  Patient Risk:   After full review of this patients clinical status, I feel that they are at least moderate risk at this time.  Time:   Today, I have spent 25 minutes with the patient with telehealth technology discussing dyslipidemia, possible familial hyperlipidemia, abnormal coronary calcium score and medication intolerance.     Medication Adjustments/Labs and Tests Ordered: Current medicines are reviewed at length with the patient today.  Concerns regarding medicines are outlined above.   Tests Ordered: No orders of the defined types were placed in this encounter.   Medication Changes: No orders of the defined types were placed in this encounter.   Disposition:  in 3 month(s)  Pixie Casino, MD, [facility], [place] Director of the Advanced Lipid Disorders &    Cardiovascular Risk Reduction Clinic Diplomate of the American Board of Clinical Lipidology Attending Cardiologist  Direct Dial: [phone]  Fax: [phone]  Website:  [email or web address]  Pixie Casino, MD  12/09/2019 9:02 AM

## 2019-12-09 NOTE — Telephone Encounter (Signed)
Attempted to call patient about med approval. No VM set up MyChart message sent.  Rx sent to pharmacy on file

## 2019-12-09 NOTE — Patient Instructions (Signed)
Medication Instructions:  Dr. Debara Pickett recommends Repatha 140mg /mL (PCSK9). This is an injectable cholesterol medication self-administered once every 14 days. This medication will need prior approval with your insurance company, which we will work on. If the medication is not approved initially, we may need to do an appeal with your insurance. We will keep you updated on this process.   Administer medication in area of fatty tissue such as abdomen, outer thigh, back up of arm - and rotate site with each injection Store medication in refrigerator until ready to administer - allow to sit at room temp for 30 mins - 1 hour prior to injection Dispose of medication in a SHARPS container - your pharmacy should be able to direct you on this and proper disposal   If you need co-pay assistance, please look into the program at healthwellfoundation.org >> disease funds >> hypercholesterolemia. This is an online application or you can call to complete.   If you need a co-pay card for Repatha: [email or web address] >> paying for Repatha If you need a co-pay card for Praluent: WedMap.it >> starting & paying for Praluent  *If you need a refill on your cardiac medications before your next appointment, please call your pharmacy*   Lab Work: FASTING lab work in 3-4 months to check cholesterol   If you have labs (blood work) drawn today and your tests are completely normal, you will receive your results only by: Marland Kitchen MyChart Message (if you have MyChart) OR . A paper copy in the mail If you have any lab test that is abnormal or we need to change your treatment, we will call you to review the results.   Testing/Procedures: NONE   Follow-Up: At [facility], you and your health needs are our priority.  As part of our continuing mission to provide you with exceptional heart care, we have created designated Provider Care Teams.  These Care Teams include your primary Cardiologist (physician) and Advanced Practice Providers  (APPs -  Physician Assistants and Nurse Practitioners) who all work together to provide you with the care you need, when you need it.  We recommend signing up for the patient portal called "MyChart".  Sign up information is provided on this After Visit Summary.  MyChart is used to connect with patients for Virtual Visits (Telemedicine).  Patients are able to view lab/test results, encounter notes, upcoming appointments, etc.  Non-urgent messages can be sent to your provider as well.   To learn more about what you can do with MyChart, go to [email or web address].    Your next appointment:   3-4 month(s) - lipid clinic  The format for your next appointment:   In Person  Provider:   K. Mali Hilty, MD   Other Instructions

## 2019-12-09 NOTE — Telephone Encounter (Signed)
Request Reference Number: [id number]. REPATHA SURE INJ 140MG /ML is approved through 06/09/2020. Your patient may now fill this prescription and it will be covered.

## 2019-12-09 NOTE — Telephone Encounter (Signed)
Follow up     Pt is calling and says the medication at the pharmacy is $300. She says she would like to take an oral medication again to try     Please call back

## 2019-12-09 NOTE — Addendum Note (Signed)
Addended by: Fidel Levy on: 12/09/2019 02:49 PM   Modules accepted: Orders

## 2019-12-09 NOTE — Telephone Encounter (Addendum)
Spoke with patient who reports she cannot afford $335+ for Repatha.  Will call [facility] to inquire - they report patient likely has a part D drug deductible that has to be met  Advised patient on this, advised on healthwell foundation grant but she does not think she would qualify. Answered questions about medication, side effects. She expressed plan to go to pharmacy to get medication. Asked that she contact us via MyChart or call with questions/concerns

## 2019-12-09 NOTE — Telephone Encounter (Signed)
PA for repatha sureclick submitted via CMM  ID: [id number] BIN: [id number] PCN: [id number] RxGrp: [id number]   (KeyUlyess Mort) [id number]

## 2019-12-16 ENCOUNTER — Telehealth: Payer: Self-pay | Admitting: Internal Medicine

## 2019-12-16 NOTE — Telephone Encounter (Signed)
Patient wants appt to learn how to give herself Repatha injection.

## 2019-12-16 NOTE — Telephone Encounter (Signed)
Patient will come Wednesday4/21 @ 3:15pm to do first injection with [facility]

## 2019-12-19 NOTE — Telephone Encounter (Signed)
Follow Up  Patient states that she was able to do the injection herself and doesn't need to come in.

## 2020-01-31 NOTE — Progress Notes (Signed)
Patient ID: Jacqueline Jenkins, female   DOB: 02/11/1951, 69 y.o.   MRN: [id number]    MEDICARE WELLNESS  Assessment:   Aneurysm of splenic artery (HCC) Control blood pressure, cholesterol, glucose, increase exercise.   Essential hypertension - continue medications, DASH diet, exercise and monitor at home. Call if greater than 130/80.  -     CBC with Differential/Platelet -     CMP/GFR  Hyperlipidemia, unspecified hyperlipidemia type -Declines all medications, intolerant zetia, statins - low risk personal history; does have family history? Familial cholesterolemia- may benefit from PCSK9- going to see Dr. Debara Pickett -? Would benefit from cardiac calcium score  - check lipids, decrease fatty foods, increase activity.  -     Lipid panel - check APa  Hx of prediabetes Recent A1Cs at goal Discussed diet/exercise, weight management  Defer A1C; check CMP Discussed general issues about diabetes pathophysiology and management., Educational material distributed., Suggested low cholesterol diet., Encouraged aerobic exercise., Discussed foot care., Reminded to get yearly retinal exam.  Medication management -     Magnesium  Malignant neoplasm of upper-outer quadrant of left breast in female, estrogen receptor positive ([place]) Continue follow up Dr. Lindi Adie  Vitamin D deficiency -     VITAMIN D 25 Hydroxy (Vit-D Deficiency, Fractures)  BRCA2 positive Continue follow up x 2016  Dural arteriovenous fistula Control blood pressure, cholesterol, glucose, increase exercise.   Osteoporosis - Dexa scheduled 09/2019, continue Vit D and Ca, weight bearing exercises  Future Appointments  Date Time Provider [place]  03/08/2020  1:40 PM [facility] DIAG TOMO 1 [facility] GI-BREAST CE  03/16/2020  1:00 PM GI-315 MR 1 GI-315MRI GI-[address]  03/26/2020 11:45 AM Hilty, Nadean Corwin, MD [facility]  03/30/2020  9:45 AM Nicholas Lose, MD [facility] None  08/01/2020  2:00 PM Liane Comber, NP [facility]  None  11/05/2020  3:00 PM Vicie Mutters, PA-C [facility] None    Medicare Attestation I have personally reviewed: The patient's medical and social history Their use of alcohol, tobacco or illicit drugs Their current medications and supplements The patient's functional ability including ADLs,fall risks, home safety risks, cognitive, and hearing and visual impairment Diet and physical activities Evidence for depression or mood disorders  The patient's weight, height, BMI, and visual acuity have been recorded in the chart.  I have made referrals, counseling, and provided education to the patient based on review of the above and I have provided the patient with a written personalized care plan for preventive services.     MEDICARE WELLNESS OBJECTIVES: Physical activity:   Cardiac risk factors:   Depression/mood screen:   Depression screen [facility] 2/9 08/01/2019  Decreased Interest 0  Down, Depressed, Hopeless 0  PHQ - 2 Score 0  Some recent data might be hidden    ADLs:  In your present state of health, do you have any difficulty performing the following activities: 08/01/2019  Hearing? N  Vision? N  Difficulty concentrating or making decisions? N  Walking or climbing stairs? N  Dressing or bathing? N  Doing errands, shopping? N  Some recent data might be hidden     Cognitive Testing  Alert? Yes  Normal Appearance?Yes  Oriented to person? Yes  Place? Yes   Time? Yes  Recall of three objects?  Yes  Can perform simple calculations? Yes  Displays appropriate judgment?Yes  Can read the correct time from a watch face?Yes  EOL planning: Does Patient Have a Medical Advance Directive?: Yes Type of Advance Directive: Healthcare Power  of Attorney, Living will Copy of [place] in Chart?: No - copy requested     Subjective:   Jacqueline Jenkins is a 69 y.o.  female who presents for  follow up on cholesterol, htn, glucose, weight, vitamin D deficiency.   She is  primary caregiver for husband with supranuclear palsy/Parkinson's, he is having a steady decline at this time. Daughter is helping take care of him.   She has been having left knee pain x 6 months. States better with keeping it straight, worse at night, or if it is bent. She has been wearing sleeve that helps.   She follows with Dr. Trula Slade for large 5 cm splenic artery aneurysm.   She underwent: Embolization in June 2013.  Continues to follow up annually.   She has history of L breast lumpectomy (+ER/+PR/HER2 Neg)  in Dec 2016 followed by chemoradiation, underwent BSO due to + BRCA2 mutation, on anastrazole, and follows with Dr. Lindi Adie. DEXA 09/08/2019  Has history of diverticulitis, on recent CT AB 04/2018 showed renal cyst had recent renal US that showed benign cyst.   BMI is Body mass index is 27.12 kg/m., she has been working on diet and exercise. Wt Readings from Last 3 Encounters:  02/01/20 158 lb (71.7 kg)  11/09/19 160 lb (72.6 kg)  11/07/19 154 lb (69.9 kg)   She has had elevated blood pressure without the diagnosis of HTN.   Her blood pressure has been controlled at home, & today their BP is BP: 124/78 She does not workout, but active caring for husband She denies chest pain, shortness of breath, dizziness. No leg swelling.  She is on cholesterol medication, started on repatha, has tried numerous statins like lipitor and zetia- has had LFT elevation or muscle aches. Family history of stroke in mother, and grandparents, in their 14's.  Had cardiac calcium score with score of 8 at 50% with her age, and she had aortic atherosclerosis. Her cholesterol is not at goal. The cholesterol last visit was:  Lab Results  Component Value Date   CHOL 272 (H) 11/02/2019   HDL 58 11/02/2019   LDLCALC 182 (H) 11/02/2019   TRIG 174 (H) 11/02/2019   CHOLHDL 4.7 11/02/2019   She has had prediabetes.  She has not been working on diet and exercise for hx of prediabetes, and denies foot  ulcerations, hyperglycemia, hypoglycemia , increased appetite, nausea, paresthesia of the feet, polydipsia, polyuria, visual disturbances, vomiting and weight loss. Last A1C in the office was:  Lab Results  Component Value Date   HGBA1C 5.7 (H) 11/02/2019   Patient is on Vitamin D supplement,  Taking 5000 IU 5/7 days/week   Lab Results  Component Value Date   VD25OH 27 (L) 11/02/2019       Lab Results  Component Value Date   GFRNONAA 85 11/09/2019    Medication Review:  Current Outpatient Medications (Endocrine & Metabolic):  .  alendronate (FOSAMAX) 70 MG tablet, Take 1 tablet (70 mg total) by mouth once a week. Take with a full glass of water on an empty stomach.  Current Outpatient Medications (Cardiovascular):  .  benazepril-hydrochlorthiazide (LOTENSIN HCT) 20-12.5 MG tablet, Take 1 tablet Daily for BP .  Evolocumab (REPATHA SURECLICK) 678 MG/ML SOAJ, Inject 1 Dose into the skin every 14 (fourteen) days.   Current Outpatient Medications (Analgesics):  .  naproxen sodium (ANAPROX) 220 MG tablet, Take 220 mg by mouth daily as needed (pain). .  meloxicam (MOBIC) 15 MG tablet,  Take one daily with food for 2 weeks, can take with tylenol, can not take with aleve, iburpofen, then as needed daily for pain   Current Outpatient Medications (Other):  .  anastrozole (ARIMIDEX) 1 MG tablet, Take 1 tablet (1 mg total) by mouth daily at 3 pm. .  Calcium Carb-Cholecalciferol (CALCIUM 1000 + D PO), Take 2 tablets by mouth daily. Gummies .  VITAMIN D PO, Take 5,000 Units by mouth. Takes 1 capsule every other day.  Current Problems (verified) Patient Active Problem List   Diagnosis Date Noted  . FHx: heart disease 12/28/2017  . Osteoporosis 06/17/2017  . Dural arteriovenous fistula 03/05/2017  . Genetic testing 09/19/2015  . BRCA2 positive 09/19/2015  . Breast cancer of upper-outer quadrant of left female breast ([place]) 08/21/2015  . Medication management 06/07/2015  . HTN  (hypertension) 06/07/2015  . Other abnormal glucose (hx of prediabetes) 11/16/2013  . Allergic rhinitis   . Hyperlipidemia   . Lumbar degenerative disc disease   . Vitamin D deficiency   . Aneurysm of splenic artery (HCC) 01/19/2012    Screening Tests Health Maintenance  Topic Date Due  . INFLUENZA VACCINE  04/01/2020  . PAP SMEAR-Modifier  03/01/2021  . MAMMOGRAM  09/07/2021  . COLONOSCOPY  01/14/2022  . TETANUS/TDAP  01/21/2025  . DEXA SCAN  Completed  . COVID-19 Vaccine  Completed  . Hepatitis C Screening  Completed  . PNA vac Low Risk Adult  Completed    Immunization History  Administered Date(s) Administered  . DT (Pediatric) 01/22/2015  . Influenza Split 06/08/2014, 06/02/2016  . Influenza, High Dose Seasonal PF 06/08/2014, 05/27/2017, 06/01/2018, 06/02/2019  . Influenza-Unspecified 06/14/2013, 06/04/2015, 05/27/2017  . Meningococcal Conjugate 09/02/2011  . Moderna SARS-COVID-2 Vaccination 10/17/2019, 11/15/2019  . PPD Test 01/18/2014  . Pneumococcal Conjugate-13 12/31/2011, 06/17/2017  . Pneumococcal Polysaccharide-23 09/02/2011  . Pneumococcal-Unspecified 09/22/1995, 09/02/2011  . Td 11/27/1994, 04/29/2004, 01/22/2015    UTD on vaccines  Preventative care: Last colonoscopy: 2013 DEXA 09/2019- osteoporosis -2.5- started on fosamax MGM 09/2019 PAP 03/2019 at GYN Echo 2017 US carotid 11/2016 [place] AB 2013  Follows Dr. Marvel [facility]   Names of Other Physician/Practitioners you currently use: 1. [facility] here for primary care 2. Dr. Kathrin Penner, eye doctor, last visit q 6 months, wears glasses  3. Dr. Jason Coop, dentist, last visit 2020 4. Dr. Hardie Shackleton did dental implant.   Patient Care Team: Unk Pinto, MD as PCP - General (Internal Medicine) Serafina Mitchell, MD as Consulting Physician (Vascular Surgery) Carol Ada, MD as Consulting Physician (Gastroenterology) Martinique, Amy, MD as Consulting Physician  (Dermatology) Nicholas Lose, MD as Consulting Physician (Hematology and Oncology) Shon Hough, MD as Consulting Physician (Ophthalmology) Paula Compton, MD as Consulting Physician (Obstetrics and Gynecology)  History reviewed: allergies, current medications, past family history, past medical history, past social history, past surgical history and problem list Allergies Allergies  Allergen Reactions  . Lescol [Fluvastatin Sodium] Other (See Comments)    Myalgia  . Lipitor [Atorvastatin] Other (See Comments)    Increased LFT's  . Vytorin [Ezetimibe-Simvastatin] Other (See Comments)    myalgia    SURGICAL HISTORY She  has a past surgical history that includes Tonsillectomy; Spine surgery (1998); Spine surgery (2001, 2010); Cervical disc surgery (1998); Lumbar disc surgery (2001, 2010); splenic aneurysm (02/10/2012); Colonoscopy (Jan. 7, 2014); visceral angiogram (N/A, 02/10/2012); Breast lumpectomy with radioactive seed and sentinel lymph node biopsy (Left, 08/29/2015); Re-excision of breast lumpectomy (Left, 09/07/2015); Portacath placement (Bilateral, 10/09/2015); Portacath  removal; Laparoscopic bilateral salpingo oophorectomy (Bilateral, 05/16/2016); IR ANGIO INTRA EXTRACRAN SEL INTERNAL CAROTID BILAT MOD SED (02/03/2017); IR NEURO EACH ADD'L AFTER BASIC UNI RIGHT (MS) (02/03/2017); IR ANGIO VERTEBRAL SEL VERTEBRAL BILAT MOD SED (02/03/2017); IR ANGIO EXTERNAL CAROTID SEL EXT CAROTID BILAT MOD SED (02/03/2017); Radiology with anesthesia (N/A, 03/05/2017); IR NEURO EACH ADD'L AFTER BASIC UNI RIGHT (MS) (03/05/2017); IR Angiogram Follow Up Study (03/05/2017); IR Transcath/Emboliz (03/05/2017); IR ANGIO INTRA EXTRACRAN SEL COM CAROTID INNOMINATE UNI R MOD SED (03/05/2017); IR ANGIO EXTERNAL CAROTID SEL EXT CAROTID UNI R MOD SED (03/05/2017); and Breast lumpectomy (Left, 2016).   FAMILY HISTORY Her family history includes Breast cancer in her cousin and maternal grandmother; Colon cancer in her maternal uncle; Deep  vein thrombosis in her mother; Diverticulitis in her mother; Heart Problems in her paternal grandfather; Heart attack in her father, paternal aunt, paternal grandmother, and paternal uncle; Heart defect in her maternal aunt; Hyperlipidemia in her mother; Hypertension in her father and mother; Leukemia in her maternal aunt; Lung cancer in her maternal uncle; Osteoporosis in her mother; Other in her father, mother, and sister; Pancreatic cancer in her maternal uncle; Stroke in her father, maternal grandfather, mother, paternal aunt, paternal grandmother, and paternal uncle; Vasculitis in her sister.   SOCIAL HISTORY She  reports that she has never smoked. She has never used smokeless tobacco. She reports current alcohol use. She reports that she does not use drugs.   Review of Systems  Constitutional: Negative for chills, fever, malaise/fatigue and weight loss.  HENT: Negative for congestion, ear pain, nosebleeds and sore throat.   Eyes: Negative.   Respiratory: Negative for cough, shortness of breath and wheezing.   Cardiovascular: Negative for chest pain, palpitations and leg swelling.  Gastrointestinal: Positive for heartburn (occasionally). Negative for blood in stool, constipation, diarrhea, melena, nausea and vomiting.  Genitourinary: Negative for dysuria, frequency and urgency.  Musculoskeletal: Positive for joint pain. Negative for back pain, falls, myalgias and neck pain.  Skin: Negative.   Neurological: Negative for dizziness, sensory change, loss of consciousness and headaches.  Psychiatric/Behavioral: Negative for depression. The patient is not nervous/anxious and does not have insomnia.      Objective:     BP 124/78   Pulse 65   Temp (!) 97.5 F (36.4 C)   Ht 5' 4"  (1.626 m)   Wt 158 lb (71.7 kg)   SpO2 100%   BMI 27.12 kg/m   General Appearance: Well nourished, alert, WD/WN, female and in no apparent distress. Eyes: PERRLA, EOMs, conjunctiva no swelling or erythema,  normal fundi and vessels. Sinuses: No frontal/maxillary tenderness ENT/Mouth: EACs patent / TMs  nl. Nares clear without erythema, swelling, mucoid exudates. Oral hygiene is good. No erythema, swelling, or exudate. Tongue normal, non-obstructing. Tonsils not swollen or erythematous. Hearing normal.  Neck: Supple, thyroid normal. No bruits, nodes or JVD. Respiratory: Respiratory effort normal.  BS equal and clear bilateral without rales, rhonci, wheezing or stridor. Cardio: Heart sounds are normal with regular rate and rhythm and no murmurs, rubs or gallops. Peripheral pulses are normal and equal bilaterally without edema. No aortic or femoral bruits. Chest: symmetric with normal excursions and percussion. Abdomen: Flat, soft  with nl bowel sounds. Nontender, no guarding, rebound, hernias, masses, or organomegaly.  Lymphatics: Non tender without lymphadenopathy.  Musculoskeletal: Full ROM all peripheral extremities, left knee with mild effusion, no warmth, erythema, + crepitus, + medial midline joint tenderness, stable ACL, stable lateral collateral ligaments, negative Mcmurray. Normal distal neurovascular, no distal  edema.  Skin: Warm and dry without rashes, lesions, cyanosis, clubbing or  ecchymosis. Multiple seborrheic keratosis across the chest and the abdomen. Neuro: Cranial nerves intact, reflexes equal bilaterally. Normal muscle tone, no cerebellar symptoms. Sensation intact.  Pysch: Alert and oriented X 3, normal affect, Insight and Judgment appropriate.    Vicie Mutters, PA-C   02/01/2020

## 2020-02-01 ENCOUNTER — Ambulatory Visit
Admission: RE | Admit: 2020-02-01 | Discharge: 2020-02-01 | Disposition: A | Discharge status: Home / Self Care | Payer: Medicare Other | Source: Ambulatory Visit | Attending: Physician Assistant | Admitting: Physician Assistant

## 2020-02-01 ENCOUNTER — Other Ambulatory Visit: Payer: Self-pay

## 2020-02-01 ENCOUNTER — Encounter: Payer: Self-pay | Admitting: Physician Assistant

## 2020-02-01 ENCOUNTER — Ambulatory Visit (INDEPENDENT_AMBULATORY_CARE_PROVIDER_SITE_OTHER): Payer: Medicare Other | Admitting: Physician Assistant

## 2020-02-01 VITALS — BP 124/78 | HR 65 | Temp 97.5°F | Ht 64.0 in | Wt 158.0 lb

## 2020-02-01 DIAGNOSIS — J309 Allergic rhinitis, unspecified: Secondary | ICD-10-CM

## 2020-02-01 DIAGNOSIS — Z0000 Encounter for general adult medical examination without abnormal findings: Secondary | ICD-10-CM

## 2020-02-01 DIAGNOSIS — Z79899 Other long term (current) drug therapy: Secondary | ICD-10-CM | POA: Diagnosis not present

## 2020-02-01 DIAGNOSIS — Z1501 Genetic susceptibility to malignant neoplasm of breast: Secondary | ICD-10-CM

## 2020-02-01 DIAGNOSIS — E785 Hyperlipidemia, unspecified: Secondary | ICD-10-CM | POA: Diagnosis not present

## 2020-02-01 DIAGNOSIS — C50412 Malignant neoplasm of upper-outer quadrant of left female breast: Secondary | ICD-10-CM

## 2020-02-01 DIAGNOSIS — M5136 Other intervertebral disc degeneration, lumbar region: Secondary | ICD-10-CM | POA: Diagnosis not present

## 2020-02-01 DIAGNOSIS — I728 Aneurysm of other specified arteries: Secondary | ICD-10-CM | POA: Diagnosis not present

## 2020-02-01 DIAGNOSIS — G8929 Other chronic pain: Secondary | ICD-10-CM

## 2020-02-01 DIAGNOSIS — Z1509 Genetic susceptibility to other malignant neoplasm: Secondary | ICD-10-CM

## 2020-02-01 DIAGNOSIS — I671 Cerebral aneurysm, nonruptured: Secondary | ICD-10-CM | POA: Diagnosis not present

## 2020-02-01 DIAGNOSIS — E559 Vitamin D deficiency, unspecified: Secondary | ICD-10-CM

## 2020-02-01 DIAGNOSIS — R6889 Other general symptoms and signs: Secondary | ICD-10-CM

## 2020-02-01 DIAGNOSIS — M818 Other osteoporosis without current pathological fracture: Secondary | ICD-10-CM | POA: Diagnosis not present

## 2020-02-01 DIAGNOSIS — E782 Mixed hyperlipidemia: Secondary | ICD-10-CM

## 2020-02-01 DIAGNOSIS — Z0001 Encounter for general adult medical examination with abnormal findings: Secondary | ICD-10-CM

## 2020-02-01 DIAGNOSIS — I10 Essential (primary) hypertension: Secondary | ICD-10-CM

## 2020-02-01 DIAGNOSIS — R7309 Other abnormal glucose: Secondary | ICD-10-CM

## 2020-02-01 DIAGNOSIS — M1712 Unilateral primary osteoarthritis, left knee: Secondary | ICD-10-CM | POA: Diagnosis not present

## 2020-02-01 DIAGNOSIS — Z170 Estrogen receptor positive status [ER+]: Secondary | ICD-10-CM

## 2020-02-01 DIAGNOSIS — M25562 Pain in left knee: Secondary | ICD-10-CM

## 2020-02-01 MED ORDER — MELOXICAM 15 MG PO TABS: ORAL_TABLET | ORAL | 1 refills | Status: DC

## 2020-02-01 NOTE — Patient Instructions (Signed)
Mobic is an antiinflammatory It helps pain, can not take with aleve, or ibuprofen You can take tylenol (560m) or tylenol arthritis (6544m with the meloxicam/antiinflammatories. The max you can take of tylenol a day is 300074maily, this is a max of 6 pills a day of the regular tyelnol (500m64mr a max of 4 a day of the tylenol arthritis (650mg17m long as no other medications you are taking contain tylenol.   Mobic can cause inflammation in your stomach and can cause ulcers or bleeding, this will look like black tarry stools Make sure you take your mobic with food Try not to take it daily, take AS needed Can take with pepcid  INFORMATION ABOUT YOUR XRAY [facility] Can walk into [address] building for an xray.Insurance account managery will have the order and take you back. You do not any paper work, I should get the result back today or tomorrow. This order is good for a year.  Can call [phone] an appointment if you wish.   Chronic Knee Pain, Adult Chronic knee pain is pain in one or both knees that lasts longer than 3 months. Symptoms of chronic knee pain may include swelling, stiffness, and discomfort. Age-related wear and tear (osteoarthritis) of the knee joint is the most common cause of chronic knee pain. Other possible causes include:  A long-term immune-related disease that causes inflammation of the knee (rheumatoid arthritis). This usually affects both knees.  Inflammatory arthritis, such as gout or pseudogout.  An injury to the knee that causes arthritis.  An injury to the knee that damages the ligaments. Ligaments are strong tissues that connect bones to each other.  Runner's knee or pain behind the kneecap. Treatment for chronic knee pain depends on the cause. The main treatments for chronic knee pain are physical therapy and weight loss. This condition may also be treated with medicines, injections, a knee sleeve or brace, and by using crutches. Rest, ice,  compression (pressure), and elevation (RICE) therapy may also be recommended. Follow these instructions at home: If you have a knee sleeve or brace:   Wear it as told by your health care provider. Remove it only as told by your health care provider.  Loosen it if your toes tingle, become numb, or turn cold and blue.  Keep it clean.  If the sleeve or brace is not waterproof: ? Do not let it get wet. ? Remove it if allowed by your health care provider, or cover it with a watertight covering when you take a bath or a shower. Managing pain, stiffness, and swelling      If directed, apply heat to the affected area as often as told by your health care provider. Use the heat source that your health care provider recommends, such as a moist heat pack or a heating pad. ? If you have a removable sleeve or brace, remove it as told by your health care provider. ? Place a towel between your skin and the heat source. ? Leave the heat on for 20-30 minutes. ? Remove the heat if your skin turns bright red. This is especially important if you are unable to feel pain, heat, or cold. You may have a greater risk of getting burned.  If directed, put ice on the affected area. ? If you have a removable sleeve or brace, remove it as told by your health care provider. ? Put ice in a plastic bag. ? Place a towel between your skin and  the bag. ? Leave the ice on for 20 minutes, 2-3 times a day.  Move your toes often to reduce stiffness and swelling.  Raise (elevate) the injured area above the level of your heart while you are sitting or lying down. Activity  Avoid activities where both feet leave the ground at the same time (high-impact activities). Examples are running, jumping rope, and doing jumping jacks.  Return to your normal activities as told by your health care provider. Ask your health care provider what activities are safe for you.  Follow the exercise plan that your health care provider  designed for you. Your health care provider may suggest that you: ? Avoid activities that make knee pain worse. This may require you to change your exercise routines, sport participation, or job duties. ? Wear shoes with cushioned soles. ? Avoid high-impact activities or sports that require running and sudden changes in direction. ? Do physical therapy as told by your health care provider. Physical therapy is planned to match your needs and abilities. It may include exercises for strength, flexibility, stability, and endurance. ? Do exercises that increase balance and strength, such as tai chi and yoga.  Do not use the injured limb to support your body weight until your health care provider says that you can. Use crutches, a cane, or a walker, as told by your health care provider. General instructions  Take over-the-counter and prescription medicines only as told by your health care provider.  Lose weight if you are overweight. Losing even a little weight can reduce knee pain. Ask your health care provider what your ideal weight is, and how to safely lose extra weight. A food expert (dietitian) may be able to help you plan your meals.  Do not use any products that contain nicotine or tobacco, such as cigarettes, e-cigarettes, and chewing tobacco. These can delay healing. If you need help quitting, ask your health care provider.  Keep all follow-up visits as told by your health care provider. This is important. Contact a health care provider if:  You have knee pain that is not getting better or gets worse.  You are unable to do your physical therapy exercises due to knee pain. Get help right away if:  Your knee swells and the swelling becomes worse.  You cannot move your knee.  You have severe knee pain. Summary  Knee pain that lasts more than 3 months is considered chronic knee pain.  The main treatments for chronic knee pain are physical therapy and weight loss. You may also need to  take medicines, wear a knee sleeve or brace, use crutches, and apply ice or heat.  Losing even a little weight can reduce knee pain. Ask your health care provider what your ideal weight is, and how to safely lose extra weight. A food expert (dietitian) may be able to help you plan your meals.  Work with a physical therapist to make a safe exercise program, as told by your health care provider. This information is not intended to replace advice given to you by your health care provider. Make sure you discuss any questions you have with your health care provider. Document Revised: 10/28/2018 Document Reviewed: 10/28/2018 Elsevier Patient Education  [employer].

## 2020-02-06 ENCOUNTER — Telehealth: Payer: Self-pay | Admitting: Hematology and Oncology

## 2020-02-06 NOTE — Telephone Encounter (Signed)
R/s per pt request.

## 2020-02-25 ENCOUNTER — Other Ambulatory Visit: Payer: Self-pay | Admitting: Hematology and Oncology

## 2020-03-08 ENCOUNTER — Ambulatory Visit
Admission: RE | Admit: 2020-03-08 | Discharge: 2020-03-08 | Disposition: A | Discharge status: Home / Self Care | Payer: Medicare Other | Source: Ambulatory Visit | Attending: Hematology and Oncology | Admitting: Hematology and Oncology

## 2020-03-08 ENCOUNTER — Other Ambulatory Visit: Payer: Self-pay

## 2020-03-08 DIAGNOSIS — R921 Mammographic calcification found on diagnostic imaging of breast: Secondary | ICD-10-CM | POA: Diagnosis not present

## 2020-03-16 ENCOUNTER — Ambulatory Visit
Admission: RE | Admit: 2020-03-16 | Discharge: 2020-03-16 | Disposition: A | Discharge status: Home / Self Care | Payer: Medicare Other | Source: Ambulatory Visit | Attending: Hematology and Oncology | Admitting: Hematology and Oncology

## 2020-03-16 ENCOUNTER — Other Ambulatory Visit: Payer: Self-pay

## 2020-03-16 DIAGNOSIS — N6489 Other specified disorders of breast: Secondary | ICD-10-CM | POA: Diagnosis not present

## 2020-03-16 DIAGNOSIS — Z1231 Encounter for screening mammogram for malignant neoplasm of breast: Secondary | ICD-10-CM

## 2020-03-16 MED ORDER — GADOBUTROL 1 MMOL/ML IV SOLN
7.0000 mL | Freq: Once | INTRAVENOUS | Status: AC | PRN
  Administered 2020-03-16: 7 mL via INTRAVENOUS

## 2020-03-22 NOTE — Progress Notes (Signed)
Patient Care Team: Unk Pinto, MD as PCP - General (Internal Medicine) Serafina Mitchell, MD as Consulting Physician (Vascular Surgery) Carol Ada, MD as Consulting Physician (Gastroenterology) Martinique, Amy, MD as Consulting Physician (Dermatology) Nicholas Lose, MD as Consulting Physician (Hematology and Oncology) Shon Hough, MD as Consulting Physician (Ophthalmology) Paula Compton, MD as Consulting Physician (Obstetrics and Gynecology)  DIAGNOSIS:    ICD-10-CM   1. Malignant neoplasm of upper-outer quadrant of left breast in female, estrogen receptor positive ([name])  C50.412    Z17.0     SUMMARY OF ONCOLOGIC HISTORY: Oncology History  Breast cancer of upper-outer quadrant of left female breast ([name])  08/03/2015 Mammogram   Left Breast: 6 mm mass @ 1 o clock   08/09/2015 Initial Diagnosis   Left breast 1:00 position: Invasive ductal carcinoma grade 1-2, ER 100%, PR 10%, HER-2 negative ratio 1.79, Ki-67 15%, T1 BN 0 stage I a clinical stage   08/29/2015 Surgery   Left lumpectomy ([facility]): invasive ductal carcinoma 1.2 cm with LVID, with DCIS intermediate grade, 1 sentinel node positive, ER 100%, PR 10%, HER-2 negative ratio 1.79, Ki-67 15% , margins negative, Mammaprint high risk, luminal B, T1cN1 stage II a   08/29/2015 Procedure   Mammaprint: High-risk, Luminal type   09/07/2015 Surgery   Left breast re-excision ([facility]): ADH, no malignancy, negative margins.    09/12/2015 Procedure   BRCA2 mutation "[id number]." Also VUS on CHEK2.  Otherwise negative. Genes analyzed:  ATM, BARD1, BRCA1, BRCA2, BRIP1, CDH1, CHEK2, FANCC, MLH1, MSH2, MSH6, NBN, PALB2, PMS2, PTEN, RAD51C, RAD51D, TP53, and XRCC2; deletion/duplication analysis (without sequencing) for EPCAM.   10/11/2015 - 02/21/2016 Chemotherapy   Adjuvant chemotherapy with dose dense Adriamycin and Cytoxan 4 followed by Abraxane weekly 12   03/27/2016 - 04/23/2016 Radiation Therapy   Adjuvant  radiation therapy Lisbeth Renshaw). Left breast: 42.5 Gy in 17 fractions. Left breast boost: 7.5 Gy in 3 fractions.    05/16/2016 Surgery   Bilateral salpingo-oophorectomies: No cancer   06/05/2016 -  Anti-estrogen oral therapy   Anastrozole 1 mg by mouth daily     CHIEF COMPLIANT: Follow-up of left breast cancer on anastrozole   INTERVAL HISTORY: Jacqueline Jenkins is a 69 y.o. with above-mentioned history of BRCA2 mutation positive left breast cancer who underwent left lumpectomy, adjuvant chemotherapy, radiation, and who is currently on antiestrogen therapy with anastrozole. Mammogram on 09/08/19 showed probably benign calcifications at the left breast lumpectomy site. Mammogram on 03/08/20 showed benign calcifications at the left lumpectomy site. Breast MRI on 03/16/20 showed no evidence of malignancy bilaterally. Bone density scan on 09/08/19 showed osteoporosis with a T-score of -2.5 and she started Fosamax with calcium and vitamin D. She presents to the clinic today for follow-up.   ALLERGIES:  is allergic to lescol [fluvastatin sodium], lipitor [atorvastatin], and vytorin [ezetimibe-simvastatin].  MEDICATIONS:  Current Outpatient Medications  Medication Sig Dispense Refill  . alendronate (FOSAMAX) 70 MG tablet Take 1 tablet (70 mg total) by mouth once a week. Take with a full glass of water on an empty stomach. 12 tablet 3  . anastrozole (ARIMIDEX) 1 MG tablet TAKE 1 TABLET BY MOUTH EVERY DAY AT 3PM 90 tablet 3  . benazepril-hydrochlorthiazide (LOTENSIN HCT) 20-12.5 MG tablet Take 1 tablet Daily for BP 90 tablet 3  . Calcium Carb-Cholecalciferol (CALCIUM 1000 + D PO) Take 2 tablets by mouth daily. Gummies    . Evolocumab (REPATHA SURECLICK) 010 MG/ML SOAJ Inject 1 Dose into the skin every 14 (fourteen) days. 2 pen 11  .  meloxicam (MOBIC) 15 MG tablet Take one daily with food for 2 weeks, can take with tylenol, can not take with aleve, iburpofen, then as needed daily for pain 30 tablet 1  . naproxen sodium  (ANAPROX) 220 MG tablet Take 220 mg by mouth daily as needed (pain).    Marland Kitchen VITAMIN D PO Take 5,000 Units by mouth. Takes 1 capsule every other day.     No current facility-administered medications for this visit.    PHYSICAL EXAMINATION: ECOG PERFORMANCE STATUS: 1 - Symptomatic but completely ambulatory  Vitals:   03/23/20 1148  BP: (!) 152/76  Pulse: 77  Resp: 18  Temp: 98.5 F (36.9 C)  SpO2: 100%   Filed Weights   03/23/20 1148  Weight: 158 lb 3.2 oz (71.8 kg)      LABORATORY DATA:  I have reviewed the data as listed CMP Latest Ref Rng & Units 11/09/2019 11/02/2019 08/01/2019  Glucose 65 - 99 mg/dL 97 86 91  BUN 7 - 25 mg/dL 16 16 19   Creatinine 0.60 - 0.93 mg/dL 0.72 0.98(H) 0.60  Sodium 135 - 146 mmol/L 132(L) 137 134(L)  Potassium 3.5 - 5.3 mmol/L 4.1 4.0 3.7  Chloride 98 - 110 mmol/L 95(L) 101 98  CO2 20 - 32 mmol/L 27 27 25   Calcium 8.6 - 10.4 mg/dL 9.5 9.6 9.9  Total Protein 6.1 - 8.1 g/dL - 6.8 7.2  Total Bilirubin 0.2 - 1.2 mg/dL - 0.3 0.4  Alkaline Phos 33 - 130 U/L - - -  AST 10 - 35 U/L - 22 25  ALT 6 - 29 U/L - 20 20    Lab Results  Component Value Date   WBC 6.9 11/02/2019   HGB 13.3 11/02/2019   HCT 38.0 11/02/2019   MCV 91.6 11/02/2019   PLT 282 11/02/2019   NEUTROABS 4,989 11/02/2019    ASSESSMENT & PLAN:  Breast cancer of upper-outer quadrant of left female breast ([name]) Left lumpectomy 08/29/2015: Invasive ductal carcinoma 1.2 cm with LVID, with DCIS intermediate grade, 1 sentinel node positive, ER 100%, PR 10%, HER-2 negative ratio 1.79, Ki-67 15% , margins negative, Mammaprint high risk, luminal B, T1cN1 stage II a. Patient does not need axillary lymph node dissection based on current NCCN guidelines and ACOSOG Z 11 clinical trial; luminal type B and high-risk phenotype. Risk of relapse without chemotherapy 24% versus 12% with chemotherapy.  Treatment summary: 1. Dose dense Adriamycin and Cytoxan 4 followed by Abraxane weekly 12 started  10/11/2015 completed 02/21/2016 2. Adjuvant radiation therapy started 03/27/2016 completed 04/23/2016 3. Adjuvant antiestrogen therapy started 05/22/2016 ----------------------------------------------------------------------------------------------------------------- Current treatment: Antiestrogen therapy with anastrozole 1 mg dailyto start 06/01/2016 BRCA2 mutation: Oophorectomy 05/16/2016: No malignancy  Anastrozole Toxicities: Able to tolerate anastrozole fairly well. Denies any hot flashes or myalgias. The initial hot flashes have improved.  Surveillance of breast cancer: 1. Mammograms Jan 2020: Benign, breast density category B 2.MRI breast 03/16/2020. (Because of BRCA2 mutation): No evidence of recurrent breast cancer, benign dystrophic calcifications related to fat necrosis in the retroareolar left breast at the site of prior lumpectomy.  Osteoporosis: Bone density January 2021: T score -2.5: Started Fosamax and calcium and vitamin D  Return to clinic 1 year for follow-up    No orders of the defined types were placed in this encounter.  The patient has a good understanding of the overall plan. she agrees with it. she will call with any problems that may develop before the next visit here.  Total time spent: 20 mins  including face to face time and time spent for planning, charting and coordination of care  Nicholas Lose, MD 03/23/2020  I, Cloyde Reams Dorshimer, am acting as scribe for Dr. Nicholas Lose.  I have reviewed the above documentation for accuracy and completeness, and I agree with the above.

## 2020-03-23 ENCOUNTER — Inpatient Hospital Stay: Payer: Medicare Other | Attending: Hematology and Oncology | Admitting: Hematology and Oncology

## 2020-03-23 ENCOUNTER — Other Ambulatory Visit: Payer: Self-pay

## 2020-03-23 DIAGNOSIS — Z79899 Other long term (current) drug therapy: Secondary | ICD-10-CM | POA: Diagnosis not present

## 2020-03-23 DIAGNOSIS — Z79811 Long term (current) use of aromatase inhibitors: Secondary | ICD-10-CM | POA: Insufficient documentation

## 2020-03-23 DIAGNOSIS — R931 Abnormal findings on diagnostic imaging of heart and coronary circulation: Secondary | ICD-10-CM

## 2020-03-23 DIAGNOSIS — Z923 Personal history of irradiation: Secondary | ICD-10-CM | POA: Insufficient documentation

## 2020-03-23 DIAGNOSIS — Z9221 Personal history of antineoplastic chemotherapy: Secondary | ICD-10-CM | POA: Diagnosis not present

## 2020-03-23 DIAGNOSIS — Z170 Estrogen receptor positive status [ER+]: Secondary | ICD-10-CM | POA: Diagnosis not present

## 2020-03-23 DIAGNOSIS — C50412 Malignant neoplasm of upper-outer quadrant of left female breast: Secondary | ICD-10-CM | POA: Insufficient documentation

## 2020-03-23 DIAGNOSIS — N951 Menopausal and female climacteric states: Secondary | ICD-10-CM | POA: Insufficient documentation

## 2020-03-23 NOTE — Assessment & Plan Note (Signed)
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 Patient does not need axillary lymph node dissection based on current NCCN guidelines and ACOSOG Z 11 clinical trial; luminal type B and high-risk phenotype. Risk of relapse without chemotherapy 24% versus 12% with chemotherapy.  Treatment summary: 1. Dose dense Adriamycin and Cytoxan 4 followed by Abraxane weekly 12 started 10/11/2015 completed 02/21/2016 2. Adjuvant radiation therapy started 03/27/2016 completed 04/23/2016 3. Adjuvant antiestrogen therapy started 05/22/2016 ----------------------------------------------------------------------------------------------------------------- Current treatment: Antiestrogen therapy with anastrozole 1 mg dailyto start 06/01/2016 BRCA2 mutation: Oophorectomy 05/16/2016: No malignancy  Anastrozole Toxicities: Able to tolerate anastrozole fairly well. Denies any hot flashes or myalgias. The initial hot flashes have improved.  Surveillance of breast cancer: 1. Mammograms Jan 2020: Benign, breast density category B 2.MRI breast 03/16/2020. (Because of BRCA2 mutation): No evidence of recurrent breast cancer, benign dystrophic calcifications related to fat necrosis in the retroareolar left breast at the site of prior lumpectomy.  Osteoporosis: Bone density January 2021: T score -2.5: Started Fosamax and calcium and vitamin D  Return to clinic 1 year for follow-up

## 2020-03-26 ENCOUNTER — Ambulatory Visit (INDEPENDENT_AMBULATORY_CARE_PROVIDER_SITE_OTHER): Payer: Medicare Other | Admitting: Internal Medicine

## 2020-03-26 ENCOUNTER — Encounter: Payer: Self-pay | Admitting: Internal Medicine

## 2020-03-26 ENCOUNTER — Other Ambulatory Visit: Payer: Self-pay

## 2020-03-26 VITALS — BP 129/69 | HR 70 | Ht 65.0 in | Wt 160.0 lb

## 2020-03-26 DIAGNOSIS — T466X5A Adverse effect of antihyperlipidemic and antiarteriosclerotic drugs, initial encounter: Secondary | ICD-10-CM

## 2020-03-26 DIAGNOSIS — E7849 Other hyperlipidemia: Secondary | ICD-10-CM | POA: Diagnosis not present

## 2020-03-26 DIAGNOSIS — M791 Myalgia, unspecified site: Secondary | ICD-10-CM | POA: Diagnosis not present

## 2020-03-26 DIAGNOSIS — Z8249 Family history of ischemic heart disease and other diseases of the circulatory system: Secondary | ICD-10-CM | POA: Diagnosis not present

## 2020-03-26 DIAGNOSIS — R931 Abnormal findings on diagnostic imaging of heart and coronary circulation: Secondary | ICD-10-CM

## 2020-03-26 NOTE — Progress Notes (Signed)
LIPID CLINIC CONSULT NOTE  Chief Complaint:  Follow-up dyslipidemia  Primary Care Physician: Unk Pinto, MD  Primary Cardiologist:  No primary care provider on file.  HPI:  Jacqueline Jenkins is a 69 y.o. female who is being seen today for the evaluation of dyslipidemia at the request of Unk Pinto, MD.  This is a pleasant 69 year old female with a history of breast cancer and BRCA2 mutation on anastrozole, who is referred for evaluation and management of dyslipidemia.  She had repeat lipids 5 days ago showing total cholesterol 272, HDL 58, triglycerides 174 and LDL of 182.  Her LDL has been well over 200 in the past.  She has been on intermittent therapy in the past including with Vytorin and Lescol which cause myalgias and atorvastatin however causing significant elevation in her liver enzymes that led to discontinuation of the medication.  She is noted to have hepatic steatosis by imaging studies however no prior studies has shown any coronary artery calcification.  She had carotid Dopplers in 2018 which showed minimal carotid irregularities.  She also has a history of a splenic artery aneurysm however no evidence of any mesenteric or abdominal aortic atherosclerosis.  There does appear to be a family history of heart disease both in her parents including on her father's side and her uncles on her father side.  She does not have diabetes but does have hypertension which has been well controlled.  He did have an echocardiogram as part of her chemotherapy in February 2017 which showed a normal EF 55 to 60%.  03/26/2020  Ms. Mourer returns today for follow-up.  We eventually were able to get her on Repatha.  She says she is tolerating the injections.  Initially is expensive but then she is paying $39 a month afterwards.  This is overall affordable and she is pleased with it.  Unfortunately she has not had a repeat assessment of her lipids in the past 4 months.  PMHx:  Past Medical  History:  Diagnosis Date  . Allergic rhinitis, cause unspecified   . Anemia    history of anemia while teenager  . BRCA2 positive   . Breast cancer ([place]) 2016   Left Breast Cancer  . Breast cancer of upper-outer quadrant of left female breast ([place])    chemo complete 01/2016, radiation 04/2016  . DDD (degenerative disc disease)   . Diverticulitis   . Family history of breast cancer 08/30/2015   Dx. In maternal grandmother in her 20s-40; dx. In maternal first cousin in her 22s-60s   . Family history of colon cancer 08/30/2015   Dx. In maternal uncle in his 64s   . Hyperlipidemia   . Hypertension   . Left kidney mass 04/06/2018   Mild growth from Ct 2013 to 04/06/2018; Renal US 2020 no growth- will not follow up  . Neuropathy   . Osteopenia 12/2011   Spine T -0.4, Femur -2.1  . Personal history of chemotherapy 2016   Left Breast Cancer  . Personal history of radiation therapy 2016   Left Breast Cancer  . PONV (postoperative nausea and vomiting)   . Splenic artery aneurysm ([place]) 2013   COILS DONE  . Vitamin D deficiency     Past Surgical History:  Procedure Laterality Date  . BREAST LUMPECTOMY Left 2016  . BREAST LUMPECTOMY WITH RADIOACTIVE SEED AND SENTINEL LYMPH NODE BIOPSY Left 08/29/2015   Procedure: LEFT BREAST LUMPECTOMY WITH RADIOACTIVE SEED WITH AXILLARY SENTINEL LYMPH NODE BIOPSY;  Surgeon:  Excell Seltzer, MD;  Location: [facility];  Service: General;  Laterality: Left;  . [place]  . COLONOSCOPY  Jan. 7, 2014  . IR ANGIO EXTERNAL CAROTID SEL EXT CAROTID BILAT MOD SED  02/03/2017  . IR ANGIO EXTERNAL CAROTID SEL EXT CAROTID UNI R MOD SED  03/05/2017  . IR ANGIO INTRA EXTRACRAN SEL COM CAROTID INNOMINATE UNI R MOD SED  03/05/2017  . IR ANGIO INTRA EXTRACRAN SEL INTERNAL CAROTID BILAT MOD SED  02/03/2017  . IR ANGIO VERTEBRAL SEL VERTEBRAL BILAT MOD SED  02/03/2017  . IR ANGIOGRAM FOLLOW UP STUDY  03/05/2017  . IR NEURO EACH ADD'L AFTER BASIC UNI  RIGHT (MS)  02/03/2017  . IR NEURO EACH ADD'L AFTER BASIC UNI RIGHT (MS)  03/05/2017  . IR TRANSCATH/EMBOLIZ  03/05/2017  . LAPAROSCOPIC BILATERAL SALPINGO OOPHERECTOMY Bilateral 05/16/2016   Procedure: LAPAROSCOPIC BILATERAL SALPINGO OOPHORECTOMY;  Surgeon: Paula Compton, MD;  Location: [facility];  Service: Gynecology;  Laterality: Bilateral;  . [place] SURGERY  2001, 2010  . PORTACATH PLACEMENT Bilateral 10/09/2015   Procedure: INSERTION PORT-A-CATH;  Surgeon: Excell Seltzer, MD;  Location: [facility];  Service: General;  Laterality: Bilateral;  . Portacath removal    . RADIOLOGY WITH ANESTHESIA N/A 03/05/2017   Procedure: ARTERIOGRAM ONYX EMBOLIZATION OF FISTULA;  Surgeon: Consuella Lose, MD;  Location: [facility];  Service: Radiology;  Laterality: N/A;  . RE-EXCISION OF BREAST LUMPECTOMY Left 09/07/2015   Procedure: RE-EXCISION OF LEFT BREAST LUMPECTOMY;  Surgeon: Excell Seltzer, MD;  Location: [facility];  Service: General;  Laterality: Left;  . SPINE SURGERY  1998   cervical diskectomy  . SPINE SURGERY  2001, 2010   Lumbar diskectomy  . splenic aneurysm  02/10/2012   Aneurysm of splenic artery  . TONSILLECTOMY    . VISCERAL ANGIOGRAM N/A 02/10/2012   Procedure: VISCERAL ANGIOGRAM;  Surgeon: Serafina Mitchell, MD;  Location: [facility] C[facility];  Service: Cardiovascular;  Laterality: N/A;    FAMHx:  Family History  Problem Relation Age of Onset  . Stroke Mother   . Osteoporosis Mother   . Hyperlipidemia Mother   . Hypertension Mother   . Other Mother        varicose veins  . Deep vein thrombosis Mother   . Diverticulitis Mother   . Breast cancer Maternal Grandmother        dx. 30s-40s; when pt's mother was 67 years old  . Other Father        bleeding problems  . Hypertension Father   . Heart attack Father   . Stroke Father   . Other Sister        one sister had a hysterectomy for fibroids  . Vasculitis Sister   . Leukemia Maternal Aunt        dx. 50s-60s  . Pancreatic  cancer Maternal Uncle        dx. late 60s-early 70s; (x2 maternal uncles)  . Heart attack Paternal Aunt   . Stroke Paternal Aunt   . Stroke Maternal Grandfather   . Heart attack Paternal Grandmother   . Stroke Paternal Grandmother   . Heart Problems Paternal Grandfather   . Colon cancer Maternal Uncle        dx. late 60s  . Heart defect Maternal Aunt        possible  . Breast cancer Cousin        dx. 50s-60s; maternal first  . Heart attack Paternal Uncle   .  Stroke Paternal Uncle   . Lung cancer Maternal Uncle        treated at [facility]; d. 47s    SOCHx:   reports that she has never smoked. She has never used smokeless tobacco. She reports current alcohol use. She reports that she does not use drugs.  ALLERGIES:  Allergies  Allergen Reactions  . Lescol [Fluvastatin Sodium] Other (See Comments)    Myalgia  . Lipitor [Atorvastatin] Other (See Comments)    Increased LFT's  . Vytorin [Ezetimibe-Simvastatin] Other (See Comments)    myalgia    ROS: Pertinent items noted in HPI and remainder of comprehensive ROS otherwise negative.  HOME MEDS: Current Outpatient Medications on File Prior to Visit  Medication Sig Dispense Refill  . alendronate (FOSAMAX) 70 MG tablet Take 1 tablet (70 mg total) by mouth once a week. Take with a full glass of water on an empty stomach. 12 tablet 3  . anastrozole (ARIMIDEX) 1 MG tablet TAKE 1 TABLET BY MOUTH EVERY DAY AT 3PM 90 tablet 3  . benazepril-hydrochlorthiazide (LOTENSIN HCT) 20-12.5 MG tablet Take 1 tablet Daily for BP 90 tablet 3  . Calcium Carb-Cholecalciferol (CALCIUM 1000 + D PO) Take 2 tablets by mouth daily. Gummies    . Evolocumab (REPATHA SURECLICK) 749 MG/ML SOAJ Inject 1 Dose into the skin every 14 (fourteen) days. 2 pen 11  . naproxen sodium (ANAPROX) 220 MG tablet Take 220 mg by mouth daily as needed (pain).    Marland Kitchen VITAMIN D PO Take 5,000 Units by mouth. Takes 1 capsule every other day.     No current facility-administered  medications on file prior to visit.    LABS/IMAGING: No results found for this or any previous visit (from the past 48 hour(s)). No results found.  LIPID PANEL:    Component Value Date/Time   CHOL 272 (H) 11/02/2019 1558   TRIG 174 (H) 11/02/2019 1558   HDL 58 11/02/2019 1558   CHOLHDL 4.7 11/02/2019 1558   VLDL 47 (H) 12/11/2016 1708   LDLCALC 182 (H) 11/02/2019 1558    WEIGHTS: Wt Readings from Last 3 Encounters:  03/26/20 160 lb (72.6 kg)  03/23/20 158 lb 3.2 oz (71.8 kg)  02/01/20 158 lb (71.7 kg)    VITALS: BP (!) 129/69   Pulse 70   Ht 5' 5" (1.651 m)   Wt 160 lb (72.6 kg)   SpO2 99%   BMI 26.63 kg/m   EXAM: Deferred  EKG: Deferred  ASSESSMENT: 1. Mixed dyslipidemia, ?FH 2. Statin intolerant-myalgias 3. Family history of coronary disease  PLAN: 1.   Ms. Mayhall has successfully started on Repatha with the plan to repeat her lipids which we will get today.  Unfortunately she is not fasting but this still should show a significant improvement on the Repatha.  If her lipids are well controlled we will continue current therapies and plan follow-up with me annually or sooner as necessary.  Pixie Casino, MD, [facility], [place] Director of the Advanced Lipid Disorders &  Cardiovascular Risk Reduction Clinic Diplomate of the American Board of Clinical Lipidology Attending Cardiologist  Direct Dial: [phone]  Fax: [phone]  Website:  [email or web address] 03/26/2020, 12:01 PM

## 2020-03-26 NOTE — Patient Instructions (Signed)
Medication Instructions:  Your physician recommends that you continue on your current medications as directed. Please refer to the Current Medication list given to you today.  *If you need a refill on your cardiac medications before your next appointment, please call your pharmacy*   Lab Work: LIPID PANEL TODAY  If you have labs (blood work) drawn today and your tests are completely normal, you will receive your results only by: Marland Kitchen MyChart Message (if you have MyChart) OR . A paper copy in the mail If you have any lab test that is abnormal or we need to change your treatment, we will call you to review the results.   Testing/Procedures: NONE   Follow-Up: At [facility], you and your health needs are our priority.  As part of our continuing mission to provide you with exceptional heart care, we have created designated Provider Care Teams.  These Care Teams include your primary Cardiologist (physician) and Advanced Practice Providers (APPs -  Physician Assistants and Nurse Practitioners) who all work together to provide you with the care you need, when you need it.  We recommend signing up for the patient portal called "MyChart".  Sign up information is provided on this After Visit Summary.  MyChart is used to connect with patients for Virtual Visits (Telemedicine).  Patients are able to view lab/test results, encounter notes, upcoming appointments, etc.  Non-urgent messages can be sent to your provider as well.   To learn more about what you can do with MyChart, go to [email or web address].    Your next appointment:   12 month(s) - lipid clinic  The format for your next appointment:   Virtual or In Office  Provider:   Raliegh Ip Mali Hilty, MD   Other Instructions

## 2020-03-27 LAB — LIPID PANEL
Chol/HDL Ratio: 2.7 ratio (ref 0.0–4.4)
Cholesterol, Total: 180 mg/dL (ref 100–199)
HDL: 67 mg/dL (ref >39)
LDL Chol Calc (NIH): 91 mg/dL (ref 0–99)
Triglycerides: 124 mg/dL (ref 0–149)
VLDL Cholesterol Cal: 22 mg/dL (ref 5–40)

## 2020-03-30 ENCOUNTER — Ambulatory Visit: Payer: Medicare Other | Admitting: Hematology and Oncology

## 2020-04-10 ENCOUNTER — Other Ambulatory Visit: Payer: Self-pay | Admitting: Internal Medicine

## 2020-05-08 DIAGNOSIS — Z6826 Body mass index (BMI) 26.0-26.9, adult: Secondary | ICD-10-CM | POA: Diagnosis not present

## 2020-05-08 DIAGNOSIS — Z01419 Encounter for gynecological examination (general) (routine) without abnormal findings: Secondary | ICD-10-CM | POA: Diagnosis not present

## 2020-05-08 DIAGNOSIS — Z780 Asymptomatic menopausal state: Secondary | ICD-10-CM | POA: Diagnosis not present

## 2020-05-08 DIAGNOSIS — Z853 Personal history of malignant neoplasm of breast: Secondary | ICD-10-CM | POA: Diagnosis not present

## 2020-05-14 ENCOUNTER — Telehealth: Payer: Self-pay | Admitting: Internal Medicine

## 2020-05-14 NOTE — Telephone Encounter (Signed)
Request Reference Number: [id number]. REPATHA SURE INJ 140MG /ML is approved through 08/31/2020

## 2020-05-14 NOTE — Telephone Encounter (Signed)
PA for repatha re-authorization submitted via CMM (Key: [id number])

## 2020-06-05 DIAGNOSIS — Z23 Encounter for immunization: Secondary | ICD-10-CM | POA: Diagnosis not present

## 2020-07-31 ENCOUNTER — Other Ambulatory Visit: Payer: Self-pay | Admitting: Hematology and Oncology

## 2020-07-31 DIAGNOSIS — I700 Atherosclerosis of aorta: Secondary | ICD-10-CM | POA: Insufficient documentation

## 2020-07-31 DIAGNOSIS — Z1231 Encounter for screening mammogram for malignant neoplasm of breast: Secondary | ICD-10-CM

## 2020-07-31 NOTE — Progress Notes (Signed)
Patient ID: Jacqueline Jenkins, female   DOB: [birth year], 69 y.o.   MRN: [id number]    3 MONTH FOLLOW UP  Assessment:   Atherosclerosis of aorta Control blood pressure, cholesterol, glucose, increase exercise.   Aneurysm of splenic artery Jacqueline Jenkins) Vascular Dr. Trula Jenkins following, CTA planned 2022 Control blood pressure, cholesterol, glucose, increase exercise.   Essential hypertension - continue medications, DASH diet, exercise and monitor at home. Call if greater than 130/80.  -     CBC with Differential/Platelet -     CMP/GFR  Hyperlipidemia/statin myopathy  -Declines all medications, intolerant zetia, statins - now on PCSK9 and doing well - Dr. Debara Jenkins follows - check lipids, decrease fatty foods, increase activity.  -     Lipid panel -     TSH  Hx of prediabetes Discussed diet/exercise, weight management  Discussed general issues about diabetes pathophysiology and management., Educational material distributed., Suggested low cholesterol diet., Encouraged aerobic exercise., Discussed foot care., Reminded to get yearly retinal exam. - Check A1C   Medication management -     Magnesium  Malignant neoplasm of upper-outer quadrant of left breast in female, estrogen receptor positive (Jacqueline Jenkins) Continue follow up Dr. Lindi Jenkins On tamoxifen   Vitamin D deficiency -     VITAMIN D 25 Hydroxy (Vit-D Deficiency, Fractures)  Dural arteriovenous fistula Control blood pressure, cholesterol, glucose, increase exercise.   Left medial knee pain Try voltaren; PT once ready, has number  Future Appointments  Date Time Provider [place]  09/18/2020 12:00 PM [facility] 3 [facility]  11/05/2020  2:00 PM Jacqueline Comber, NP [facility] None  03/21/2021  2:15 PM Jacqueline Jenkins [facility] None  08/01/2021  2:30 PM Jacqueline Comber, NP [facility] None    Subjective:   Jacqueline Jenkins is a 69 y.o.  female who presents for 3 month follow up on cholesterol, htn, glucose, weight, vitamin D  deficiency.   She is primary caregiver for husband with supranuclear palsy/Parkinson's, he is having a steady decline at this time. Doing fairly after recent transition to pureed food. Daughter is helping take care of him.   She has been having left medial knee pain intermittently, 1-2 times a week after strenuous activity, was referred to PT but bad timing with husband, postponing. She is managing with with lifestyle changes. Taking aleve occasionally with benefit. Tylenol doesn't help. Wants to postpone PT for now, has number to call when ready.   She has history of L breast lumpectomy (+ER/+PR/HER2 Neg)  in Dec 2016 followed by chemoradiation, underwent BSO due to + BRCA2 mutation, on anastrazole, and follows with Dr. Lindi Jenkins. DEXA 09/08/2019 showed T score -2.5 by Dr. Lindi Jenkins, now on fosamax weekly without SE.   BMI is Body mass index is 26.13 kg/m., she has been working on diet and exercise.  Wt Readings from Last 3 Encounters:  08/01/20 157 lb (71.2 kg)  03/26/20 160 lb (72.6 kg)  03/23/20 158 lb 3.2 oz (71.8 kg)   She follows with Dr. Trula Jenkins for large 5 cm splenic artery aneurysm.   She underwent: Embolization in June 2013.  Was follwing up annually by [place], last 05/2019, planning for 2 year follow up by CTA next.    Her blood pressure has been controlled at home, & today their BP is BP: 130/80 She does not workout, but active caring for husband She denies chest pain, shortness of breath, dizziness. No leg swelling.  She is on cholesterol medication, now on repatha via Dr. Debara Jenkins, does  self injections and doing well. Hx of numerous statins and zetia- has had LFT elevation or muscle aches. Family history of stroke in mother, and grandparents, in their 52's. Had cardiac calcium score with score of 8 at 50% with her age, and she had aortic atherosclerosis. Her cholesterol is not at goal. The cholesterol last visit was:  Lab Results  Component Value Date   CHOL 180 03/26/2020   HDL 67  03/26/2020   LDLCALC 91 03/26/2020   TRIG 124 03/26/2020   CHOLHDL 2.7 03/26/2020   She has had prediabetes.  She has not been working on diet and exercise for hx of prediabetes, and denies foot ulcerations, hyperglycemia, hypoglycemia , increased appetite, nausea, paresthesia of the feet, polydipsia, polyuria, visual disturbances, vomiting and weight loss.  Last A1C in the office was:  Lab Results  Component Value Date   HGBA1C 5.7 (H) 11/02/2019    Last GFR:  Lab Results  Component Value Date   GFRNONAA 85 11/09/2019   Patient is on Vitamin D supplement,  Taking 5000 IU every other day, also 1000 IU daily in multivitamin Lab Results  Component Value Date   VD25OH 27 (L) 11/02/2019        Medication Review:  Current Outpatient Medications (Endocrine & Metabolic):    alendronate (FOSAMAX) 70 MG tablet, Take 1 tablet (70 mg total) by mouth once a week. Take with a full glass of water on an empty stomach.  Current Outpatient Medications (Cardiovascular):    benazepril-hydrochlorthiazide (LOTENSIN HCT) 20-12.5 MG tablet, TAKE 1 TABLET DAILY FOR BLOOD PRESSURE   Evolocumab (REPATHA SURECLICK) 585 MG/ML SOAJ, Inject 1 Dose into the skin every 14 (fourteen) days.   Current Outpatient Medications (Analgesics):    naproxen sodium (ANAPROX) 220 MG tablet, Take 220 mg by mouth daily as needed (pain).   Current Outpatient Medications (Other):    anastrozole (ARIMIDEX) 1 MG tablet, TAKE 1 TABLET BY MOUTH EVERY DAY AT 3PM   Calcium Carb-Cholecalciferol (CALCIUM 1000 + D PO), Take 2 tablets by mouth daily. Gummies   VITAMIN D PO, Take 5,000 Units by mouth. Takes 1 capsule every other day.  Current Problems (verified) Patient Active Problem List   Diagnosis Date Noted   Aortic atherosclerosis ([place]) 07/31/2020   FHx: heart disease 12/28/2017   Osteoporosis 06/17/2017   Dural arteriovenous fistula 03/05/2017   Genetic testing 09/19/2015   BRCA2 positive 09/19/2015    Breast cancer of upper-outer quadrant of left female breast ([place]) 08/21/2015   Medication management 06/07/2015   HTN (hypertension) 06/07/2015   Other abnormal glucose (hx of prediabetes) 11/16/2013   Allergic rhinitis    Hyperlipidemia    Lumbar degenerative disc disease    Vitamin D deficiency    Aneurysm of splenic artery ([facility]) 01/19/2012    Allergies Allergies  Allergen Reactions   Lescol [Fluvastatin Sodium] Other (See Comments)    Myalgia   Lipitor [Atorvastatin] Other (See Comments)    Increased LFT's   Vytorin [Ezetimibe-Simvastatin] Other (See Comments)    myalgia    SURGICAL HISTORY She  has a past surgical history that includes Tonsillectomy; Spine surgery (1998); Spine surgery (2001, 2010); Cervical disc surgery (1998); Lumbar disc surgery (2001, 2010); splenic aneurysm (02/10/2012); Colonoscopy (Jan. 7, 2014); visceral angiogram (N/A, 02/10/2012); Breast lumpectomy with radioactive seed and sentinel lymph node biopsy (Left, 08/29/2015); Re-excision of breast lumpectomy (Left, 09/07/2015); Portacath placement (Bilateral, 10/09/2015); Portacath removal; Laparoscopic bilateral salpingo oophorectomy (Bilateral, 05/16/2016); IR ANGIO INTRA EXTRACRAN SEL INTERNAL CAROTID BILAT MOD SED (  02/03/2017); IR NEURO EACH ADD'L AFTER BASIC UNI RIGHT (MS) (02/03/2017); IR ANGIO VERTEBRAL SEL VERTEBRAL BILAT MOD SED (02/03/2017); IR ANGIO EXTERNAL CAROTID SEL EXT CAROTID BILAT MOD SED (02/03/2017); Radiology with anesthesia (N/A, 03/05/2017); IR NEURO EACH ADD'L AFTER BASIC UNI RIGHT (MS) (03/05/2017); IR Angiogram Follow Up Study (03/05/2017); IR Transcath/Emboliz (03/05/2017); IR ANGIO INTRA EXTRACRAN SEL COM CAROTID INNOMINATE UNI R MOD SED (03/05/2017); IR ANGIO EXTERNAL CAROTID SEL EXT CAROTID UNI R MOD SED (03/05/2017); and Breast lumpectomy (Left, 2016).   FAMILY HISTORY Her family history includes Breast cancer in her cousin and maternal grandmother; Colon cancer in her maternal uncle; Deep vein  thrombosis in her mother; Diverticulitis in her mother; Heart Problems in her paternal grandfather; Heart attack in her father, paternal aunt, paternal grandmother, and paternal uncle; Heart defect in her maternal aunt; Hyperlipidemia in her mother; Hypertension in her father and mother; Leukemia in her maternal aunt; Lung cancer in her maternal uncle; Osteoporosis in her mother; Other in her father, mother, and sister; Pancreatic cancer in her maternal uncle; Stroke in her father, maternal grandfather, mother, paternal aunt, paternal grandmother, and paternal uncle; Vasculitis in her sister.   SOCIAL HISTORY She  reports that she has never smoked. She has never used smokeless tobacco. She reports current alcohol use. She reports that she does not use drugs.    Review of Systems  Constitutional: Negative for chills, fever, malaise/fatigue and weight loss.  HENT: Negative for congestion, ear pain, nosebleeds and sore throat.   Eyes: Negative.   Respiratory: Negative for cough, shortness of breath and wheezing.   Cardiovascular: Negative for chest pain, palpitations and leg swelling.  Gastrointestinal: Negative for blood in stool, constipation, diarrhea, heartburn, melena, nausea and vomiting.  Genitourinary: Negative for dysuria, frequency and urgency.  Musculoskeletal: Positive for joint pain (left medial knee). Negative for back pain, falls, myalgias and neck pain.  Skin: Negative.   Neurological: Negative for dizziness, sensory change, loss of consciousness and headaches.  Psychiatric/Behavioral: Negative for depression. The patient is not nervous/anxious and does not have insomnia.      Objective:     BP 130/80    Pulse 70    Temp (!) 97.3 F (36.3 C)    Wt 157 lb (71.2 kg)    SpO2 98%    BMI 26.13 kg/m   General Appearance: Well nourished, alert, WD/WN, female and in no apparent distress. Eyes: PERRLA, EOMs, conjunctiva no swelling or erythema, normal fundi and vessels. Sinuses: No  frontal/maxillary tenderness ENT/Mouth: EACs patent / TMs  nl. Nares clear without erythema, swelling, mucoid exudates. Oral hygiene is good. No erythema, swelling, or exudate. Tongue normal, non-obstructing. Tonsils not swollen or erythematous. Hearing normal.  Neck: Supple, thyroid normal. No bruits, nodes or JVD. Respiratory: Respiratory effort normal.  BS equal and clear bilateral without rales, rhonci, wheezing or stridor. Cardio: Heart sounds are normal with regular rate and rhythm and no murmurs, rubs or gallops. Peripheral pulses are normal and equal bilaterally without edema. No aortic or femoral bruits. Chest: symmetric with normal excursions and percussion. Abdomen: Flat, soft  with nl bowel sounds. Nontender, no guarding, rebound, hernias, masses, or organomegaly.  Lymphatics: Non tender without lymphadenopathy.  Musculoskeletal: Full ROM all peripheral extremities, left knee with no effusion, no warmth, erythema, + crepitus, + medial midline joint tenderness, stable ACL, stable lateral collateral ligaments, negative Mcmurray. Normal distal neurovascular, no distal edema.  Skin: Warm and dry without rashes, lesions, cyanosis, clubbing or  ecchymosis. Multiple seborrheic keratosis across  the chest and the abdomen. Neuro: Cranial nerves intact, reflexes equal bilaterally. Normal muscle tone, no cerebellar symptoms. Sensation intact.  Pysch: Alert and oriented X 3, normal affect, Insight and Judgment appropriate.    Izora Ribas, NP   08/01/2020

## 2020-08-01 ENCOUNTER — Encounter: Payer: Self-pay | Admitting: Adult Health

## 2020-08-01 ENCOUNTER — Ambulatory Visit (INDEPENDENT_AMBULATORY_CARE_PROVIDER_SITE_OTHER): Payer: Medicare Other | Admitting: Adult Health

## 2020-08-01 ENCOUNTER — Other Ambulatory Visit: Payer: Self-pay

## 2020-08-01 VITALS — BP 130/80 | HR 70 | Temp 97.3°F | Wt 157.0 lb

## 2020-08-01 DIAGNOSIS — R7309 Other abnormal glucose: Secondary | ICD-10-CM

## 2020-08-01 DIAGNOSIS — I671 Cerebral aneurysm, nonruptured: Secondary | ICD-10-CM | POA: Diagnosis not present

## 2020-08-01 DIAGNOSIS — Z79899 Other long term (current) drug therapy: Secondary | ICD-10-CM | POA: Diagnosis not present

## 2020-08-01 DIAGNOSIS — E782 Mixed hyperlipidemia: Secondary | ICD-10-CM

## 2020-08-01 DIAGNOSIS — C50412 Malignant neoplasm of upper-outer quadrant of left female breast: Secondary | ICD-10-CM | POA: Diagnosis not present

## 2020-08-01 DIAGNOSIS — G720 Drug-induced myopathy: Secondary | ICD-10-CM

## 2020-08-01 DIAGNOSIS — T466X5A Adverse effect of antihyperlipidemic and antiarteriosclerotic drugs, initial encounter: Secondary | ICD-10-CM | POA: Insufficient documentation

## 2020-08-01 DIAGNOSIS — E559 Vitamin D deficiency, unspecified: Secondary | ICD-10-CM

## 2020-08-01 DIAGNOSIS — I10 Essential (primary) hypertension: Secondary | ICD-10-CM | POA: Diagnosis not present

## 2020-08-01 DIAGNOSIS — I728 Aneurysm of other specified arteries: Secondary | ICD-10-CM | POA: Diagnosis not present

## 2020-08-01 DIAGNOSIS — I700 Atherosclerosis of aorta: Secondary | ICD-10-CM

## 2020-08-01 DIAGNOSIS — E663 Overweight: Secondary | ICD-10-CM | POA: Diagnosis not present

## 2020-08-01 DIAGNOSIS — Z170 Estrogen receptor positive status [ER+]: Secondary | ICD-10-CM

## 2020-08-01 NOTE — Patient Instructions (Signed)
Goals   None       Suggest trying voltaren or aspercreme - 3-4 times daily for knee regularly for a few weeks, ice as needed   Chronic Knee Pain, Adult Knee pain that lasts longer than 3 months is called chronic knee pain. You may have pain in one or both knees. Symptoms of chronic knee pain may also include swelling and stiffness. The most common cause is age-related wear and tear (osteoarthritis) of your knee joint. Many conditions can cause chronic knee pain. Treatment depends on the cause. The main treatments are physical therapy and weight loss. It may also be treated with medicines, injections, a knee sleeve or brace, and by using crutches. Rest, ice, compression (pressure), and elevation (RICE) therapy may also be recommended. Follow these instructions at home: If you have a knee sleeve or brace:   Wear it as told by your doctor. Remove it only as told by your doctor.  Loosen it if your toes: ? Tingle. ? Become numb. ? Turn cold and blue.  Keep it clean.  If the sleeve or brace is not waterproof: ? Do not let it get wet. ? Remove it if told by your doctor, or cover it with a watertight covering when you take a bath or shower. Managing pain, stiffness, and swelling      If told, put heat on your knee. Do this as often as told by your doctor. Use the heat source that your doctor recommends, such as a moist heat pack or a heating pad. ? If you have a removable sleeve or brace, remove it as told by your doctor. ? Place a towel between your skin and the heat source. ? Leave the heat on for 20-30 minutes. ? Remove the heat if your skin turns bright red. This is very important if you are unable to feel pain, heat, or cold. You may have a greater risk of getting burned.  If told, put ice on your knee. ? If you have a removable sleeve or brace, remove it as told by your doctor. ? Put ice in a plastic bag. ? Place a towel between your skin and the bag. ? Leave the ice on for  20 minutes, 2-3 times a day.  Move your toes often.  Raise (elevate) the injured area above the level of your heart while you are sitting or lying down. Activity  Avoid activities where both feet leave the ground at the same time (high-impact activities). Examples are running, jumping rope, and doing jumping jacks.  Return to your normal activities as told by your doctor. Ask your doctor what activities are safe for you.  Follow the exercise plan that your doctor makes for you. Your doctor may suggest that you: ? Avoid activities that make knee pain worse. You may need to change the exercises that you do, the sports that you participate in, or your job duties. ? Wear shoes with cushioned soles. ? Avoid high-impact activities or sports that require running and sudden changes in direction. ? Do exercises or physical therapy as told by your doctor. Physical therapy is planned to match your needs and abilities. ? Do exercises that increase your balance and strength, such as tai chi and yoga.  Do not use your injured knee to support your body weight until your doctor says that you can. Use crutches, a cane, or a walker, as told by your doctor. General instructions  Take over-the-counter and prescription medicines only as told by your  doctor.  If you are overweight, work with your doctor and a food expert (dietitian) to set goals to lose weight. Being overweight can make your knee hurt more.  Do not use any products that contain nicotine or tobacco, such as cigarettes, e-cigarettes, and chewing tobacco. If you need help quitting, ask your doctor.  Keep all follow-up visits as told by your doctor. This is important. Contact a doctor if:  You have knee pain that is not getting better or gets worse.  You are not able to do your exercises due to knee pain. Get help right away if:  Your knee swells and the swelling becomes worse.  You cannot move your knee.  You have very bad knee  pain. Summary  Knee pain that lasts more than 3 months is considered chronic knee pain.  The main treatments for chronic knee pain are physical therapy and weight loss. You may also need to take medicines, wear a knee sleeve or brace, use crutches, and put ice or heat on your knee.  Lose weight if you are overweight. Work with your doctor and a food expert (dietitian) to help you set goals to lose weight. Being overweight can make your knee hurt more.  Work with a physical therapist to make a safe exercise program, as told by your doctor. This information is not intended to replace advice given to you by your health care provider. Make sure you discuss any questions you have with your health care provider. Document Revised: 10/28/2018 Document Reviewed: 10/28/2018 Elsevier Patient Education  [place].

## 2020-08-02 LAB — LIPID PANEL
Cholesterol: 173 mg/dL
HDL: 68 mg/dL
LDL Cholesterol (Calc): 88 mg/dL
Non-HDL Cholesterol (Calc): 105 mg/dL
Total CHOL/HDL Ratio: 2.5 (calc)
Triglycerides: 76 mg/dL

## 2020-08-02 LAB — HEMOGLOBIN A1C
Hgb A1c MFr Bld: 5.5 % of total Hgb (ref <5.7)
Mean Plasma Glucose: 111 (calc)
eAG (mmol/L): 6.2 (calc)

## 2020-08-02 LAB — COMPLETE METABOLIC PANEL WITHOUT GFR
AG Ratio: 1.6 (calc) (ref 1.0–2.5)
ALT: 23 U/L (ref 6–29)
AST: 26 U/L (ref 10–35)
Albumin: 4.4 g/dL (ref 3.6–5.1)
Alkaline phosphatase (APISO): 65 U/L (ref 37–153)
BUN: 12 mg/dL (ref 7–25)
CO2: 27 mmol/L (ref 20–32)
Calcium: 9.9 mg/dL (ref 8.6–10.4)
Chloride: 101 mmol/L (ref 98–110)
Creat: 0.61 mg/dL (ref 0.60–0.93)
GFR, Est African American: 106 mL/min/1.73m2
GFR, Est Non African American: 91 mL/min/1.73m2
Globulin: 2.7 g/dL (ref 1.9–3.7)
Glucose, Bld: 81 mg/dL (ref 65–99)
Potassium: 4.1 mmol/L (ref 3.5–5.3)
Sodium: 137 mmol/L (ref 135–146)
Total Bilirubin: 0.5 mg/dL (ref 0.2–1.2)
Total Protein: 7.1 g/dL (ref 6.1–8.1)

## 2020-08-02 LAB — CBC WITH DIFFERENTIAL/PLATELET
Absolute Monocytes: 487 cells/uL (ref 200–950)
Basophils Absolute: 99 cells/uL (ref 0–200)
Basophils Relative: 1.7 %
Eosinophils Absolute: 168 cells/uL (ref 15–500)
Eosinophils Relative: 2.9 %
HCT: 38 % (ref 35.0–45.0)
Hemoglobin: 12.7 g/dL (ref 11.7–15.5)
Lymphs Abs: 963 cells/uL (ref 850–3900)
MCH: 31 pg (ref 27.0–33.0)
MCHC: 33.4 g/dL (ref 32.0–36.0)
MCV: 92.7 fL (ref 80.0–100.0)
MPV: 10.1 fL (ref 7.5–12.5)
Monocytes Relative: 8.4 %
Neutro Abs: 4083 cells/uL (ref 1500–7800)
Neutrophils Relative %: 70.4 %
Platelets: 283 10*3/uL (ref 140–400)
RBC: 4.1 10*6/uL (ref 3.80–5.10)
RDW: 12.1 % (ref 11.0–15.0)
Total Lymphocyte: 16.6 %
WBC: 5.8 10*3/uL (ref 3.8–10.8)

## 2020-08-02 LAB — TSH: TSH: 0.96 m[IU]/L (ref 0.40–4.50)

## 2020-08-02 LAB — MAGNESIUM: Magnesium: 1.9 mg/dL (ref 1.5–2.5)

## 2020-08-02 LAB — VITAMIN D 25 HYDROXY (VIT D DEFICIENCY, FRACTURES): Vit D, 25-Hydroxy: 37 ng/mL (ref 30–100)

## 2020-08-08 ENCOUNTER — Telehealth: Payer: Self-pay | Admitting: Internal Medicine

## 2020-08-08 NOTE — Telephone Encounter (Signed)
PA for repatha sureclick submitted via CMM (Key: [id number])

## 2020-08-11 ENCOUNTER — Other Ambulatory Visit: Payer: Self-pay | Admitting: Hematology and Oncology

## 2020-08-17 ENCOUNTER — Telehealth: Payer: Self-pay | Admitting: Internal Medicine

## 2020-08-17 NOTE — Telephone Encounter (Signed)
° ° °  Pt c/o medication issue:  1. Name of Medication: Evolocumab (REPATHA SURECLICK) 497 MG/ML SOAJ  2. How are you currently taking this medication (dosage and times per day)? [id number] - [place], [place] - [address] [place] [address] [place] [address]  3. Are you having a reaction (difficulty breathing--STAT)?   4. What is your medication issue? Pt said she received a letter from insurance that they need medical necessity for her repatha, she gave her Member ID [id number] and ref# [id number]. Phone number:  [employer] [phone]

## 2020-08-20 NOTE — Telephone Encounter (Signed)
Patient aware this has been faxed  

## 2020-08-20 NOTE — Telephone Encounter (Signed)
Spoke with patient about denial. Advised appeal letter has been faxed

## 2020-08-20 NOTE — Telephone Encounter (Signed)
PA denied due to attestation statement that the provider attests the info submitted is true/accuate to the best of their knowledge and [employer] may do an audit  Appeals letter composed/faxed to [phone]

## 2020-08-23 NOTE — Telephone Encounter (Signed)
    Yvette with [facility] called, she would like to inform that appeal for pt's repatha has been approved and will send approval letter to Dr. Debara Pickett

## 2020-09-04 NOTE — Telephone Encounter (Signed)
Repatha approved until 09/02/2020 -- 08/31/2021 Authorization # [id number]

## 2020-09-18 ENCOUNTER — Other Ambulatory Visit: Payer: Self-pay

## 2020-09-18 ENCOUNTER — Ambulatory Visit
Admission: RE | Admit: 2020-09-18 | Discharge: 2020-09-18 | Disposition: A | Discharge status: Home / Self Care | Payer: Medicare Other | Source: Ambulatory Visit | Attending: Hematology and Oncology | Admitting: Hematology and Oncology

## 2020-09-18 DIAGNOSIS — Z1231 Encounter for screening mammogram for malignant neoplasm of breast: Secondary | ICD-10-CM | POA: Diagnosis not present

## 2020-10-29 DIAGNOSIS — D2239 Melanocytic nevi of other parts of face: Secondary | ICD-10-CM | POA: Diagnosis not present

## 2020-10-29 DIAGNOSIS — L820 Inflamed seborrheic keratosis: Secondary | ICD-10-CM | POA: Diagnosis not present

## 2020-10-29 DIAGNOSIS — D2262 Melanocytic nevi of left upper limb, including shoulder: Secondary | ICD-10-CM | POA: Diagnosis not present

## 2020-10-29 DIAGNOSIS — L821 Other seborrheic keratosis: Secondary | ICD-10-CM | POA: Diagnosis not present

## 2020-10-29 DIAGNOSIS — D1801 Hemangioma of skin and subcutaneous tissue: Secondary | ICD-10-CM | POA: Diagnosis not present

## 2020-10-29 DIAGNOSIS — D225 Melanocytic nevi of trunk: Secondary | ICD-10-CM | POA: Diagnosis not present

## 2020-10-29 DIAGNOSIS — D2261 Melanocytic nevi of right upper limb, including shoulder: Secondary | ICD-10-CM | POA: Diagnosis not present

## 2020-10-29 DIAGNOSIS — D2271 Melanocytic nevi of right lower limb, including hip: Secondary | ICD-10-CM | POA: Diagnosis not present

## 2020-11-02 NOTE — Progress Notes (Signed)
Patient ID: Jacqueline Jenkins, female   DOB: 10/09/1950, 70 y.o.   MRN: [id number]   CPE  Assessment:   Encounter for Annual Wellness Exam  Due annually Discussed shingrix - check with insurance  Atherosclerosis of aorta Control blood pressure, cholesterol, glucose, increase exercise.   Aneurysm of splenic artery [facility]) Vascular Dr. Trula Slade following, CTA planned 05/2021 Control blood pressure, cholesterol, glucose, increase exercise.   Essential hypertension - continue medications, DASH diet, exercise and monitor at home. Call if greater than 130/80.  -     CBC with Differential/Platelet -     CMP/GFR -     EKG -     UA, microalbumin  Hyperlipidemia/statin myopathy  -Declines all medications, intolerant zetia, statins - now on PCSK9 and doing well - Dr. Debara Pickett follows - check lipids, decrease fatty foods, increase activity.  -     Lipid panel -     TSH  Hx of prediabetes Discussed diet/exercise, weight management  Discussed general issues about diabetes pathophysiology and management., Educational material distributed., Suggested low cholesterol diet., Encouraged aerobic exercise., Discussed foot care., Reminded to get yearly retinal exam. - Check A1C   Medication management -     Magnesium  Malignant neoplasm of upper-outer quadrant of left breast in female, estrogen receptor positive ([place]) Continue follow up Dr. Lindi Adie On Anastrozole UTD mammogram, alternating q38mwith MRI  Vitamin D deficiency Continue supplement for goal 60-100;  Check Vitamin D  Dural arteriovenous fistula S/p repair; monitor for recurrent sx  Left medial knee pain Mild arthritis, bony pearl; patient reports improved sx; decling ortho referral at this time Managing well with PRN OTC analgesics   Orders Placed This Encounter  Procedures  . CBC with Differential/Platelet  . COMPLETE METABOLIC PANEL WITH GFR  . Magnesium  . Lipid panel  . TSH  . Hemoglobin A1c  . Microalbumin / creatinine  urine ratio  . Urinalysis, Routine w reflex microscopic  . VITAMIN D 25 Hydroxy (Vit-D Deficiency, Fractures)  . EKG 12-Lead     Future Appointments  Date Time Provider [place] 02/12/2021  2:30 PM CLiane Comber NP [facility] None  03/18/2021  1:50 PM GI-315 MR 1 GI-315MRI GI-[address]  03/21/2021  2:15 PM GNicholas Lose MD [facility] None  11/05/2021  2:00 PM CLiane Comber NP [facility] None    Subjective:   Jacqueline MASSENGALEis a 70y.o.  female who presents for CPE. She has Aneurysm of splenic artery ([place]; Allergic rhinitis; Hyperlipidemia; Lumbar degenerative disc disease; Vitamin D deficiency; Other abnormal glucose (hx of prediabetes); Medication management; HTN (hypertension); Breast cancer of upper-outer quadrant of left female breast ([place]; Genetic testing; BRCA2 positive; Dural arteriovenous fistula; Osteoporosis; FHx: heart disease; Aortic atherosclerosis ([place]; and Statin myopathy on their problem list.  She is primary caregiver for husband with supranuclear palsy/Parkinson's, he is having a steady decline at this time. Doing fairly after recent transition to pureed food. Daughter is helping take care of him. Has 2 daughters. 3 grandchildren, local.   No concerns today.   She has been having left medial knee pain intermittently, 1-2 times a week after strenuous activity, xray 01/2020 showed mild appearing osteoarthritis is most notable in the patellofemoral compartment. 0.5 cm loose body projecting in the posterior aspect of the joint on the lateral view was noted but patient states sx are improved, declines ortho referral at this time. She is managing with with lifestyle changes. Taking aleve occasionally with benefit.   She has history  of L breast lumpectomy (+ER/+PR/HER2 Neg)  in Dec 2016 followed by chemoradiation, underwent BSO due to + BRCA2 mutation, on anastrazole with plan to continue, and follows with Dr. Lindi Adie. DEXA 09/08/2019 showed T score -2.5 by Dr.  Lindi Adie, has been on fosamax weekly without SE. She had reassuring mammogram 09/18/2020. Will have MRI in 6 months.   She had dural AV fistula repaired by neurosurgery in 2019 without issues and was released unless recurrent issue.   BMI is Body mass index is 27.6 kg/m., she has been working on diet and exercise. Generally making good choices.  Wt Readings from Last 3 Encounters:  11/05/20 160 lb 12.8 oz (72.9 kg)  08/01/20 157 lb (71.2 kg)  03/26/20 160 lb (72.6 kg)   She follows with Dr. Trula Slade for large 5 cm splenic artery aneurysm,    She underwent: Embolization in June 2013, had coil placed as well.  Was follwing up annually by [place], last 05/2019, was planning for 2 year follow up by CTA.    Her blood pressure has been controlled at home, & today their BP is BP: 122/70 She does not workout, but active caring for husband She denies chest pain, shortness of breath, dizziness. No leg swelling.  She is on cholesterol medication, now on repatha via Dr. Debara Pickett, does self injections and doing well. Hx of numerous statins and zetia- has had LFT elevation or muscle aches. Family history of stroke in mother, and grandparents, in their 3's. Had cardiac calcium score with score of 8 at 50% with her age, and she had aortic atherosclerosis. Her cholesterol is at goal of LDL <100. The cholesterol last visit was:  Lab Results  Component Value Date   CHOL 173 08/01/2020   HDL 68 08/01/2020   LDLCALC 88 08/01/2020   TRIG 76 08/01/2020   CHOLHDL 2.5 08/01/2020   She has had prediabetes.  She has not been working on diet and exercise for hx of prediabetes, and denies foot ulcerations, hyperglycemia, hypoglycemia , increased appetite, nausea, paresthesia of the feet, polydipsia, polyuria, visual disturbances, vomiting and weight loss.  Last A1C in the office was:  Lab Results  Component Value Date   HGBA1C 5.5 08/01/2020    Last GFR:  Lab Results  Component Value Date   GFRNONAA 91 08/01/2020    Patient is on Vitamin D supplement,  Taking 5000 IU every other day, ? Nausea with higher dose, also 1000 IU daily in multivitamin Lab Results  Component Value Date   VD25OH 37 08/01/2020        Medication Review:  Current Outpatient Medications (Endocrine & Metabolic):  .  alendronate (FOSAMAX) 70 MG tablet, TAKE 1 TABLET BY MOUTH ONCE A WEEK. TAKE WITH A FULL GLASS OF WATER ON AN EMPTY STOMACH.  Current Outpatient Medications (Cardiovascular):  .  benazepril-hydrochlorthiazide (LOTENSIN HCT) 20-12.5 MG tablet, TAKE 1 TABLET DAILY FOR BLOOD PRESSURE .  Evolocumab (REPATHA SURECLICK) 338 MG/ML SOAJ, Inject 1 Dose into the skin every 14 (fourteen) days.   Current Outpatient Medications (Analgesics):  .  naproxen sodium (ANAPROX) 220 MG tablet, Take 220 mg by mouth daily as needed (pain).   Current Outpatient Medications (Other):  .  anastrozole (ARIMIDEX) 1 MG tablet, TAKE 1 TABLET BY MOUTH EVERY DAY AT 3PM .  Calcium Carb-Cholecalciferol (CALCIUM 1000 + D PO), Take 2 tablets by mouth daily. Gummies .  VITAMIN D PO, Take 5,000 Units by mouth. Takes 1 capsule every other day.  Current Problems (verified) Patient Active  Problem List   Diagnosis Date Noted  . Statin myopathy 08/01/2020  . Aortic atherosclerosis ([place]) 07/31/2020  . FHx: heart disease 12/28/2017  . Osteoporosis 06/17/2017  . Dural arteriovenous fistula 03/05/2017  . Genetic testing 09/19/2015  . BRCA2 positive 09/19/2015  . Breast cancer of upper-outer quadrant of left female breast ([place]) 08/21/2015  . Medication management 06/07/2015  . HTN (hypertension) 06/07/2015  . Other abnormal glucose (hx of prediabetes) 11/16/2013  . Allergic rhinitis   . Hyperlipidemia   . Lumbar degenerative disc disease   . Vitamin D deficiency   . Aneurysm of splenic artery ([facility]) 01/19/2012    Allergies Allergies  Allergen Reactions  . Lescol [Fluvastatin Sodium] Other (See Comments)    Myalgia  . Lipitor [Atorvastatin]  Other (See Comments)    Increased LFT's  . Vytorin [Ezetimibe-Simvastatin] Other (See Comments)    myalgia     Immunization History  Administered Date(s) Administered  . DT (Pediatric) 01/22/2015  . Influenza Split 06/08/2014, 06/02/2016  . Influenza, High Dose Seasonal PF 06/08/2014, 05/27/2017, 06/01/2018, 06/02/2019, 06/01/2020  . Influenza-Unspecified 06/14/2013, 06/04/2015, 05/27/2017  . Meningococcal Conjugate 09/02/2011  . Moderna Sars-Covid-2 Vaccination 10/17/2019, 11/15/2019  . PFIZER(Purple Top)SARS-COV-2 Vaccination 06/27/2020  . PPD Test 01/18/2014  . Pneumococcal Conjugate-13 12/31/2011, 06/17/2017  . Pneumococcal Polysaccharide-23 09/02/2011  . Pneumococcal-Unspecified 09/22/1995, 09/02/2011  . Td 11/27/1994, 04/29/2004, 01/22/2015   Tetanus: 2016 Influenza: 06/2020 Pneumonia: 2013 Prevnar: 2018 Shingrix: discussed, cost, check with insurance  Covid 19: 3/3, moderna/pfizer  Preventative care: Last colonoscopy: 2013 DEXA 09/2019- osteoporosis -2.5- on fosamax - Dr. Lindi Adie is managing  MGM 09/18/2020  MRI breast planned in 03/2021  PAP 03/2019 at GYN,  Follows Dr. Marvel [facility]   Names of Other Physician/Practitioners you currently use: 1. [facility] here for primary care 2. Dr. Kathrin Penner, eye doctor, last visit 2021. q 6 months, wears glasses  3. Dr. Jason Coop, dentist, last visit 2021 4. Dr. Hardie Shackleton did dental implant 5. Dr. Amy Martinique, derm, last 10/2020   SURGICAL HISTORY She  has a past surgical history that includes Tonsillectomy (1968); Spine surgery (1998); Spine surgery (2001, 2010); Cervical disc surgery (1998); Lumbar disc surgery (2001, 2010); splenic aneurysm (02/10/2012); Colonoscopy (Jan. 7, 2014); visceral angiogram (N/A, 02/10/2012); Breast lumpectomy with radioactive seed and sentinel lymph node biopsy (Left, 08/29/2015); Re-excision of breast lumpectomy (Left, 09/07/2015); Portacath placement  (Bilateral, 10/09/2015); Portacath removal; Laparoscopic bilateral salpingo oophorectomy (Bilateral, 05/16/2016); IR ANGIO INTRA EXTRACRAN SEL INTERNAL CAROTID BILAT MOD SED (02/03/2017); IR NEURO EACH ADD'L AFTER BASIC UNI RIGHT (MS) (02/03/2017); IR ANGIO VERTEBRAL SEL VERTEBRAL BILAT MOD SED (02/03/2017); IR ANGIO EXTERNAL CAROTID SEL EXT CAROTID BILAT MOD SED (02/03/2017); Radiology with anesthesia (N/A, 03/05/2017); IR NEURO EACH ADD'L AFTER BASIC UNI RIGHT (MS) (03/05/2017); IR Angiogram Follow Up Study (03/05/2017); IR Transcath/Emboliz (03/05/2017); IR ANGIO INTRA EXTRACRAN SEL COM CAROTID INNOMINATE UNI R MOD SED (03/05/2017); IR ANGIO EXTERNAL CAROTID SEL EXT CAROTID UNI R MOD SED (03/05/2017); Breast lumpectomy (Left, 2016); and dural AV fistula (03/2018).   FAMILY HISTORY Her family history includes Breast cancer in her cousin and maternal grandmother; Colon cancer in her maternal uncle; Deep vein thrombosis in her mother; Diverticulitis in her mother; Heart Problems in her paternal grandfather; Heart attack in her father, paternal aunt, paternal grandmother, and paternal uncle; Heart defect in her maternal aunt; Hyperlipidemia in her mother; Hypertension in her father and mother; Leukemia in her maternal aunt; Lung cancer in her maternal uncle; Osteoporosis in her mother; Other  in her father, mother, and sister; Pancreatic cancer in her maternal uncle; Stroke in her father, maternal grandfather, mother, paternal aunt, paternal grandmother, and paternal uncle; Vasculitis in her sister.   SOCIAL HISTORY She  reports that she has never smoked. She has never used smokeless tobacco. She reports previous alcohol use. She reports that she does not use drugs.   Review of Systems  Constitutional: Negative for chills, fever, malaise/fatigue and weight loss.  HENT: Negative for congestion, ear pain, nosebleeds and sore throat.   Eyes: Negative.   Respiratory: Negative for cough, shortness of breath and wheezing.    Cardiovascular: Negative for chest pain, palpitations and leg swelling.  Gastrointestinal: Negative for blood in stool, constipation, diarrhea, heartburn, melena, nausea and vomiting.  Genitourinary: Negative for dysuria, frequency and urgency.  Musculoskeletal: Positive for joint pain (left medial knee). Negative for back pain, falls, myalgias and neck pain.  Skin: Negative.   Neurological: Negative for dizziness, sensory change, loss of consciousness and headaches.  Psychiatric/Behavioral: Negative for depression. The patient is not nervous/anxious and does not have insomnia.      Objective:     BP 122/70   Pulse 74   Temp (!) 97.5 F (36.4 C)   Ht _0  (1.626 m)   Wt 160 lb 12.8 oz (72.9 kg)   SpO2 96%   BMI 27.60 kg/m   General Appearance: Well nourished, alert, WD/WN, female and in no apparent distress. Eyes: PERRLA, EOMs, conjunctiva no swelling or erythema Sinuses: No frontal/maxillary tenderness ENT/Mouth: EACs patent / TMs  nl. Nares clear without erythema, swelling, mucoid exudates. Oral hygiene is good. No erythema, swelling, or exudate. Tongue normal, non-obstructing. Tonsils not swollen or erythematous. Hearing normal.  Neck: Supple, thyroid normal. No bruits, nodes or JVD. Respiratory: Respiratory effort normal.  BS equal and clear bilateral without rales, rhonci, wheezing or stridor. Cardio: Heart sounds are normal with regular rate and rhythm and no murmurs, rubs or gallops. Peripheral pulses are normal and equal bilaterally without edema. No aortic or femoral bruits. Chest: symmetric with normal excursions and percussion. Abdomen: Flat, soft  with nl bowel sounds. Nontender, no guarding, rebound, hernias, masses, or organomegaly.  Lymphatics: Non tender without lymphadenopathy.  Musculoskeletal: Full ROM all peripheral extremities, left knee with no effusion, no warmth, erythema, + crepitus, + medial midline joint tenderness, stable ACL, stable lateral collateral  ligaments, negative Mcmurray. Normal distal neurovascular, no distal edema.  Skin: Warm and dry without rashes, lesions, cyanosis, clubbing or  ecchymosis. Multiple seborrheic keratosis across the chest and the abdomen. Neuro: Cranial nerves intact, reflexes equal bilaterally. Normal muscle tone, no cerebellar symptoms. Sensation intact.  Pysch: Alert and oriented X 3, normal affect, Insight and Judgment appropriate.  GU: deferred to GYN Breasts: deferred to GYN and oncology; had recent normal mammogram  EKG: NSR, No ST changes  The patient's weight, height, BMI  have been recorded in the chart.  I have made referrals, counseling, and provided education to the patient based on review of the above and I have provided the patient with a written personalized care plan for preventive services.      Jacqueline Ribas, NP   11/05/2020

## 2020-11-05 ENCOUNTER — Other Ambulatory Visit: Payer: Self-pay

## 2020-11-05 ENCOUNTER — Ambulatory Visit (INDEPENDENT_AMBULATORY_CARE_PROVIDER_SITE_OTHER): Payer: Medicare Other | Admitting: Adult Health

## 2020-11-05 ENCOUNTER — Encounter: Payer: Self-pay | Admitting: Adult Health

## 2020-11-05 VITALS — BP 122/70 | HR 74 | Temp 97.5°F | Ht 64.0 in | Wt 160.8 lb

## 2020-11-05 DIAGNOSIS — C50412 Malignant neoplasm of upper-outer quadrant of left female breast: Secondary | ICD-10-CM | POA: Diagnosis not present

## 2020-11-05 DIAGNOSIS — E782 Mixed hyperlipidemia: Secondary | ICD-10-CM

## 2020-11-05 DIAGNOSIS — M818 Other osteoporosis without current pathological fracture: Secondary | ICD-10-CM | POA: Diagnosis not present

## 2020-11-05 DIAGNOSIS — G720 Drug-induced myopathy: Secondary | ICD-10-CM

## 2020-11-05 DIAGNOSIS — T466X5A Adverse effect of antihyperlipidemic and antiarteriosclerotic drugs, initial encounter: Secondary | ICD-10-CM

## 2020-11-05 DIAGNOSIS — Z170 Estrogen receptor positive status [ER+]: Secondary | ICD-10-CM

## 2020-11-05 DIAGNOSIS — Z79899 Other long term (current) drug therapy: Secondary | ICD-10-CM | POA: Diagnosis not present

## 2020-11-05 DIAGNOSIS — I700 Atherosclerosis of aorta: Secondary | ICD-10-CM

## 2020-11-05 DIAGNOSIS — E559 Vitamin D deficiency, unspecified: Secondary | ICD-10-CM | POA: Diagnosis not present

## 2020-11-05 DIAGNOSIS — Z8249 Family history of ischemic heart disease and other diseases of the circulatory system: Secondary | ICD-10-CM

## 2020-11-05 DIAGNOSIS — J309 Allergic rhinitis, unspecified: Secondary | ICD-10-CM | POA: Diagnosis not present

## 2020-11-05 DIAGNOSIS — Z1329 Encounter for screening for other suspected endocrine disorder: Secondary | ICD-10-CM

## 2020-11-05 DIAGNOSIS — I728 Aneurysm of other specified arteries: Secondary | ICD-10-CM | POA: Diagnosis not present

## 2020-11-05 DIAGNOSIS — I671 Cerebral aneurysm, nonruptured: Secondary | ICD-10-CM | POA: Diagnosis not present

## 2020-11-05 DIAGNOSIS — I10 Essential (primary) hypertension: Secondary | ICD-10-CM

## 2020-11-05 DIAGNOSIS — Z0000 Encounter for general adult medical examination without abnormal findings: Secondary | ICD-10-CM

## 2020-11-05 DIAGNOSIS — Z131 Encounter for screening for diabetes mellitus: Secondary | ICD-10-CM | POA: Diagnosis not present

## 2020-11-05 DIAGNOSIS — R7309 Other abnormal glucose: Secondary | ICD-10-CM

## 2020-11-05 DIAGNOSIS — Z136 Encounter for screening for cardiovascular disorders: Secondary | ICD-10-CM

## 2020-11-05 NOTE — Patient Instructions (Addendum)
Jacqueline Jenkins , Thank you for taking time to come for your Annual Wellness Visit. I appreciate your ongoing commitment to your health goals. Please review the following plan we discussed and let me know if I can assist you in the future.   These are the goals we discussed: Goals   None     This is a list of the screening recommended for you and due dates:  Health Maintenance  Topic Date Due   COVID-19 Vaccine (4 - Booster) 12/26/2020   Pap Smear  03/01/2021   Colon Cancer Screening  01/14/2022   Mammogram  09/18/2022   Tetanus Vaccine  01/21/2025   Flu Shot  Completed   DEXA scan (bone density measurement)  Completed    Hepatitis C: One time screening is recommended by Center for Disease Control  (CDC) for  adults born from [birth year] through 1967.   Completed   Pneumonia vaccines  Completed   HPV Vaccine  Aged Out   Check with insurance about shingrix coverage - 2 shots 2-6 months apart, prevents 90-96% of shingles cases, and breakthrough cases are typically mild. Can get at [facility], if no coverage about $500 total   Know what a healthy weight is for you (roughly BMI <25) and aim to maintain this  Aim for 7+ servings of fruits and vegetables daily  65-80+ fluid ounces of water or unsweet tea for healthy kidneys  Limit to max 1 drink of alcohol per day; avoid smoking/tobacco  Limit animal fats in diet for cholesterol and heart health - choose grass fed whenever available  Avoid highly processed foods, and foods high in saturated/trans fats  Aim for low stress - take time to unwind and care for your mental health  Aim for 150 min of moderate intensity exercise weekly for heart health, and weights twice weekly for bone health  Aim for 7-9 hours of sleep daily     Zoster Vaccine, Recombinant injection What is this medicine? ZOSTER VACCINE (ZOS ter vak SEEN) is a vaccine used to reduce the risk of getting shingles. This vaccine is not used to treat shingles or  nerve pain from shingles. This medicine may be used for other purposes; ask your health care provider or pharmacist if you have questions. COMMON BRAND NAME(S): [facility] What should I tell my health care provider before I take this medicine? They need to know if you have any of these conditions:  cancer  immune system problems  an unusual or allergic reaction to Zoster vaccine, other medications, foods, dyes, or preservatives  pregnant or trying to get pregnant  breast-feeding How should I use this medicine? This vaccine is injected into a muscle. It is given by a health care provider. A copy of Vaccine Information Statements will be given before each vaccination. Be sure to read this information carefully each time. This sheet may change often. Talk to your health care provider about the use of this vaccine in children. This vaccine is not approved for use in children. Overdosage: If you think you have taken too much of this medicine contact a poison control center or emergency room at once. NOTE: This medicine is only for you. Do not share this medicine with others. What if I miss a dose? Keep appointments for follow-up (booster) doses. It is important not to miss your dose. Call your health care provider if you are unable to keep an appointment. What may interact with this medicine?  medicines that suppress your immune system  medicines to treat cancer  steroid medicines like prednisone or cortisone This list may not describe all possible interactions. Give your health care provider a list of all the medicines, herbs, non-prescription drugs, or dietary supplements you use. Also tell them if you smoke, drink alcohol, or use illegal drugs. Some items may interact with your medicine. What should I watch for while using this medicine? Visit your health care provider regularly. This vaccine, like all vaccines, may not fully protect everyone. What side effects may I notice from  receiving this medicine? Side effects that you should report to your doctor or health care professional as soon as possible:  allergic reactions (skin rash, itching or hives; swelling of the face, lips, or tongue)  trouble breathing Side effects that usually do not require medical attention (report these to your doctor or health care professional if they continue or are bothersome):  chills  headache  fever  nausea  pain, redness, or irritation at site where injected  tiredness  vomiting This list may not describe all possible side effects. Call your doctor for medical advice about side effects. You may report side effects to FDA at 1-800-FDA-1088. Where should I keep my medicine? This vaccine is only given by a health care provider. It will not be stored at home. NOTE: This sheet is a summary. It may not cover all possible information. If you have questions about this medicine, talk to your doctor, pharmacist, or health care provider.  2021 Elsevier/Gold Standard (2019-09-23 16:23:07)

## 2020-11-06 LAB — CBC WITH DIFFERENTIAL/PLATELET
Absolute Monocytes: 439 cells/uL (ref 200–950)
Basophils Absolute: 68 cells/uL (ref 0–200)
Basophils Relative: 1.2 %
Eosinophils Absolute: 239 cells/uL (ref 15–500)
Eosinophils Relative: 4.2 %
HCT: 39.6 % (ref 35.0–45.0)
Hemoglobin: 13.3 g/dL (ref 11.7–15.5)
Lymphs Abs: 838 cells/uL — ABNORMAL LOW (ref 850–3900)
MCH: 30.6 pg (ref 27.0–33.0)
MCHC: 33.6 g/dL (ref 32.0–36.0)
MCV: 91 fL (ref 80.0–100.0)
MPV: 10.3 fL (ref 7.5–12.5)
Monocytes Relative: 7.7 %
Neutro Abs: 4115 cells/uL (ref 1500–7800)
Neutrophils Relative %: 72.2 %
Platelets: 280 10*3/uL (ref 140–400)
RBC: 4.35 10*6/uL (ref 3.80–5.10)
RDW: 11.9 % (ref 11.0–15.0)
Total Lymphocyte: 14.7 %
WBC: 5.7 10*3/uL (ref 3.8–10.8)

## 2020-11-06 LAB — URINALYSIS, ROUTINE W REFLEX MICROSCOPIC
Bacteria, UA: NONE SEEN /HPF
Bilirubin Urine: NEGATIVE
Glucose, UA: NEGATIVE
Hgb urine dipstick: NEGATIVE
Hyaline Cast: NONE SEEN /LPF
Ketones, ur: NEGATIVE
Nitrite: NEGATIVE
Protein, ur: NEGATIVE
RBC / HPF: NONE SEEN /HPF (ref 0–2)
Specific Gravity, Urine: 1.012 (ref 1.001–1.03)
Squamous Epithelial / HPF: NONE SEEN /HPF (ref <=5)
WBC, UA: NONE SEEN /HPF (ref 0–5)
pH: 7.5 (ref 5.0–8.0)

## 2020-11-06 LAB — COMPLETE METABOLIC PANEL WITH GFR
AG Ratio: 1.5 (calc) (ref 1.0–2.5)
ALT: 17 U/L (ref 6–29)
AST: 21 U/L (ref 10–35)
Albumin: 4.3 g/dL (ref 3.6–5.1)
Alkaline phosphatase (APISO): 66 U/L (ref 37–153)
BUN: 16 mg/dL (ref 7–25)
CO2: 28 mmol/L (ref 20–32)
Calcium: 9.5 mg/dL (ref 8.6–10.4)
Chloride: 97 mmol/L — ABNORMAL LOW (ref 98–110)
Creat: 0.73 mg/dL (ref 0.60–0.93)
GFR, Est African American: 96 mL/min/{1.73_m2} (ref >=60)
GFR, Est Non African American: 83 mL/min/{1.73_m2} (ref >=60)
Globulin: 2.8 g/dL (calc) (ref 1.9–3.7)
Glucose, Bld: 89 mg/dL (ref 65–99)
Potassium: 4.2 mmol/L (ref 3.5–5.3)
Sodium: 133 mmol/L — ABNORMAL LOW (ref 135–146)
Total Bilirubin: 0.5 mg/dL (ref 0.2–1.2)
Total Protein: 7.1 g/dL (ref 6.1–8.1)

## 2020-11-06 LAB — LIPID PANEL
Cholesterol: 167 mg/dL (ref <200)
HDL: 65 mg/dL (ref >=50)
LDL Cholesterol (Calc): 83 mg/dL (calc)
Non-HDL Cholesterol (Calc): 102 mg/dL (calc) (ref <130)
Total CHOL/HDL Ratio: 2.6 (calc) (ref <5.0)
Triglycerides: 97 mg/dL (ref <150)

## 2020-11-06 LAB — MICROALBUMIN / CREATININE URINE RATIO
Creatinine, Urine: 52 mg/dL (ref 20–275)
Microalb Creat Ratio: 4 mcg/mg creat (ref <30)
Microalb, Ur: 0.2 mg/dL

## 2020-11-06 LAB — HEMOGLOBIN A1C
Hgb A1c MFr Bld: 5.5 % of total Hgb (ref <5.7)
Mean Plasma Glucose: 111 mg/dL
eAG (mmol/L): 6.2 mmol/L

## 2020-11-06 LAB — MAGNESIUM: Magnesium: 1.7 mg/dL (ref 1.5–2.5)

## 2020-11-06 LAB — TSH: TSH: 0.8 mIU/L (ref 0.40–4.50)

## 2020-11-06 LAB — VITAMIN D 25 HYDROXY (VIT D DEFICIENCY, FRACTURES): Vit D, 25-Hydroxy: 46 ng/mL (ref 30–100)

## 2020-11-07 DIAGNOSIS — H5203 Hypermetropia, bilateral: Secondary | ICD-10-CM | POA: Diagnosis not present

## 2020-11-07 DIAGNOSIS — H25813 Combined forms of age-related cataract, bilateral: Secondary | ICD-10-CM | POA: Diagnosis not present

## 2020-11-07 DIAGNOSIS — H43813 Vitreous degeneration, bilateral: Secondary | ICD-10-CM | POA: Diagnosis not present

## 2020-11-07 DIAGNOSIS — D3131 Benign neoplasm of right choroid: Secondary | ICD-10-CM | POA: Diagnosis not present

## 2020-12-20 ENCOUNTER — Other Ambulatory Visit: Payer: Self-pay | Admitting: Internal Medicine

## 2021-02-07 ENCOUNTER — Ambulatory Visit (INDEPENDENT_AMBULATORY_CARE_PROVIDER_SITE_OTHER): Payer: Medicare Other | Admitting: Adult Health

## 2021-02-07 ENCOUNTER — Encounter: Payer: Self-pay | Admitting: Adult Health

## 2021-02-07 ENCOUNTER — Other Ambulatory Visit: Payer: Self-pay

## 2021-02-07 VITALS — BP 142/82 | HR 73 | Temp 97.7°F | Ht 64.0 in | Wt 162.0 lb

## 2021-02-07 DIAGNOSIS — R7309 Other abnormal glucose: Secondary | ICD-10-CM | POA: Diagnosis not present

## 2021-02-07 DIAGNOSIS — G720 Drug-induced myopathy: Secondary | ICD-10-CM

## 2021-02-07 DIAGNOSIS — J309 Allergic rhinitis, unspecified: Secondary | ICD-10-CM | POA: Diagnosis not present

## 2021-02-07 DIAGNOSIS — I700 Atherosclerosis of aorta: Secondary | ICD-10-CM

## 2021-02-07 DIAGNOSIS — Z1501 Genetic susceptibility to malignant neoplasm of breast: Secondary | ICD-10-CM

## 2021-02-07 DIAGNOSIS — M818 Other osteoporosis without current pathological fracture: Secondary | ICD-10-CM

## 2021-02-07 DIAGNOSIS — R6889 Other general symptoms and signs: Secondary | ICD-10-CM

## 2021-02-07 DIAGNOSIS — E782 Mixed hyperlipidemia: Secondary | ICD-10-CM | POA: Diagnosis not present

## 2021-02-07 DIAGNOSIS — I10 Essential (primary) hypertension: Secondary | ICD-10-CM

## 2021-02-07 DIAGNOSIS — Z0001 Encounter for general adult medical examination with abnormal findings: Secondary | ICD-10-CM | POA: Diagnosis not present

## 2021-02-07 DIAGNOSIS — E559 Vitamin D deficiency, unspecified: Secondary | ICD-10-CM

## 2021-02-07 DIAGNOSIS — C50412 Malignant neoplasm of upper-outer quadrant of left female breast: Secondary | ICD-10-CM

## 2021-02-07 DIAGNOSIS — I728 Aneurysm of other specified arteries: Secondary | ICD-10-CM

## 2021-02-07 DIAGNOSIS — M5136 Other intervertebral disc degeneration, lumbar region: Secondary | ICD-10-CM | POA: Diagnosis not present

## 2021-02-07 DIAGNOSIS — Z170 Estrogen receptor positive status [ER+]: Secondary | ICD-10-CM

## 2021-02-07 DIAGNOSIS — Z79899 Other long term (current) drug therapy: Secondary | ICD-10-CM | POA: Diagnosis not present

## 2021-02-07 DIAGNOSIS — Z0000 Encounter for general adult medical examination without abnormal findings: Secondary | ICD-10-CM

## 2021-02-07 NOTE — Progress Notes (Signed)
Patient ID: Jacqueline Jenkins, female   DOB: 1951/02/10, 70 y.o.   MRN: [id number]   MEDICARE ANNUAL WELLNESS  Assessment:   Annual Medicare Wellness Visit Due annually  Discussed shingrix - check with insurance  Atherosclerosis of aorta ([place]) Control blood pressure, cholesterol, glucose, increase exercise.   Aneurysm of splenic artery [facility]) Vascular Dr. Trula Slade following, CTA planned 05/2021 Control blood pressure, cholesterol, glucose, increase exercise.   Essential hypertension - fairly controlled at home, atypical elevation; no med change today - has follow up - DASH diet, exercise and monitor at home. Call if persistently greater than 130/80.  -     CBC with Differential/Platelet -defer -     CMP/GFR - defer  Hyperlipidemia/statin myopathy  - now on PCSK9 and doing well - Dr. Debara Pickett follows - check lipids, decrease fatty foods, increase activity.  -     Lipid panel -     TSH  Hx of prediabetes Recent A1Cs at goal Discussed diet/exercise, weight management  Defer A1C  Medication management -     Magnesium - defer  Malignant neoplasm of upper-outer quadrant of left breast in female, estrogen receptor positive ([place]) Continue follow up Dr. Lindi Adie On Anastrozole UTD mammogram, alternating q34mwith MRI  Vitamin D deficiency Continue supplement for goal 60-100;  Check Vitamin D annually  Dural arteriovenous fistula S/p repair; monitor for recurrent sx  Insect bite Presumed; resolving with topical antibiotic and steroid Monitor; follow up if not resolving as expected Tetanus UTD   Reviewed last labs, no concerns and no changes. Defer labs today after discussion with patient. Will schedule follow up later this year and plan labs at that time.   Future Appointments  Date Time Provider DThree Points 02/12/2021  2:30 PM CLiane Comber NP [facility] None  03/18/2021  1:50 PM GI-315 MR 1 [id number] GI-[address]  03/21/2021  2:15 PM GNicholas Lose MD [facility] None   11/05/2021  2:00 PM CLiane Comber NP [facility] None    Subjective:   Jacqueline GROOMis a 70y.o.  female who presents for acute visit for insect bit and requests AWV be completed while she is here. She has [place]enic [place]; Allergic rhinitis; Hyperlipidemia; Lumbar degenerative disc disease; Vitamin D deficiency; Other abnormal glucose (hx of prediabetes); Medication management; HTN (hypertension); Breast cancer of upper-outer quadrant of left female breast ([place]; Genetic testing; BRCA2 positive; Dural arteriovenous fistula; Osteoporosis; FHx: heart disease; [place]; and Statin myopathy on their problem list.  She is primary caregiver for husband with supranuclear palsy/Parkinson's.  She had suspected insect bite to left ankle 5 days ago  while walking in garden with swelling, has been applying topical triple antibiotic and hydrocortisone, significantly improved per photos.   She has history of L breast lumpectomy (+ER/+PR/HER2 Neg)  in Dec 2016 followed by chemoradiation, underwent BSO due to + BRCA2 mutation, on anastrazole with plan to continue, and follows with Dr. GLindi Adie DEXA 09/08/2019 showed T score -2.5 by Dr. GLindi Adie has been on fosamax weekly without SE. She had reassuring mammogram 09/18/2020. Will have MRI in July 2022.    She had dural AV fistula repaired by neurosurgery in 2019 without issues and was released unless recurrent issue.   BMI is Body mass index is 27.81 kg/m., she has been working on diet and exercise. Generally making good choices. Walking 10 min daily.  Wt Readings from Last 3 Encounters:  02/07/21 162 lb (73.5 kg)  11/05/20 160 lb 12.8 oz (  72.9 kg)  08/01/20 157 lb (71.2 kg)   She follows with Dr. Trula Slade for large 5 cm splenic artery aneurysm,    She underwent: Embolization in June 2013, had coil placed as well.  Was follwing up annually by [place], last 05/2019, was planning for 2 year follow up by CTA.    Her blood pressure  has been controlled at home (120s-low 140s rarely, 70-80s), today their BP is BP: (!) 142/82,  She does workout, but active caring for husband She denies chest pain, shortness of breath, dizziness. No leg swelling.  She is on cholesterol medication, hx of numerous intolerances, now on repatha via Dr. Debara Pickett, does self injections and doing well. Had cardiac calcium score with score of 8 at 50% with her age, and she had aortic atherosclerosis. Her cholesterol is at goal of LDL <100. The cholesterol last visit was:  Lab Results  Component Value Date   CHOL 167 11/05/2020   HDL 65 11/05/2020   LDLCALC 83 11/05/2020   TRIG 97 11/05/2020   CHOLHDL 2.6 11/05/2020   She has had prediabetes, controlled by lifestyle  She denies foot ulcerations, hyperglycemia, hypoglycemia , increased appetite, nausea, paresthesia of the feet, polydipsia, polyuria, visual disturbances, vomiting and weight loss.  Last A1C in the office was:  Lab Results  Component Value Date   HGBA1C 5.5 11/05/2020    Last GFR:  Lab Results  Component Value Date   GFRNONAA 83 11/05/2020   Patient is on Vitamin D supplement,  Taking 5000 IU every other day, ?Nausea with higher dose, also 1000 IU daily in multivitamin Lab Results  Component Value Date   VD25OH 46 11/05/2020        Medication Review:  Current Outpatient Medications (Endocrine & Metabolic):    alendronate (FOSAMAX) 70 MG tablet, TAKE 1 TABLET BY MOUTH ONCE A WEEK. TAKE WITH A FULL GLASS OF WATER ON AN EMPTY STOMACH.  Current Outpatient Medications (Cardiovascular):    benazepril-hydrochlorthiazide (LOTENSIN HCT) 20-12.5 MG tablet, TAKE 1 TABLET DAILY FOR BLOOD PRESSURE   REPATHA SURECLICK 320 MG/ML SOAJ, INJECT 1 DOSE INTO THE SKIN EVERY 14 (FOURTEEN) DAYS.   Current Outpatient Medications (Analgesics):    naproxen sodium (ANAPROX) 220 MG tablet, Take 220 mg by mouth daily as needed (pain).   Current Outpatient Medications (Other):    anastrozole  (ARIMIDEX) 1 MG tablet, TAKE 1 TABLET BY MOUTH EVERY DAY AT 3PM   Calcium Carb-Cholecalciferol (CALCIUM 1000 + D PO), Take 2 tablets by mouth daily. Gummies   VITAMIN D PO, Take 5,000 Units by mouth. Takes 1 capsule every other day.  Current Problems (verified) Patient Active Problem List   Diagnosis Date Noted   Statin myopathy 08/01/2020   Aortic atherosclerosis ([place]) 07/31/2020   FHx: heart disease 12/28/2017   Osteoporosis 06/17/2017   Dural arteriovenous fistula 03/05/2017   Genetic testing 09/19/2015   BRCA2 positive 09/19/2015   Breast cancer of upper-outer quadrant of left female breast ([place]) 08/21/2015   Medication management 06/07/2015   HTN (hypertension) 06/07/2015   Other abnormal glucose (hx of prediabetes) 11/16/2013   Allergic rhinitis    Hyperlipidemia    Lumbar degenerative disc disease    Vitamin D deficiency    Aneurysm of splenic artery ([place]) 01/19/2012    Allergies Allergies  Allergen Reactions   Lescol [Fluvastatin Sodium] Other (See Comments)    Myalgia   Lipitor [Atorvastatin] Other (See Comments)    Increased LFT's   Vytorin [Ezetimibe-Simvastatin] Other (See Comments)  myalgia     Immunization History  Administered Date(s) Administered   DT (Pediatric) 01/22/2015   Influenza Split 06/08/2014, 06/02/2016   Influenza, High Dose Seasonal PF 06/08/2014, 05/27/2017, 06/01/2018, 06/02/2019, 06/01/2020   Influenza-Unspecified 06/14/2013, 06/04/2015, 05/27/2017   Meningococcal Conjugate 09/02/2011   Moderna Sars-Covid-2 Vaccination 10/17/2019, 11/15/2019   PFIZER(Purple Top)SARS-COV-2 Vaccination 06/27/2020, 01/22/2021   PPD Test 01/18/2014   Pneumococcal Conjugate-13 12/31/2011, 06/17/2017   Pneumococcal Polysaccharide-23 09/02/2011   Pneumococcal-Unspecified 09/22/1995, 09/02/2011   Td 11/27/1994, 04/29/2004, 01/22/2015   Tetanus: 2016 Influenza: 06/2020 Pneumonia: 2013 Prevnar: 2018 Shingrix: discussed, cost, check with insurance  Covid  19: 2/2, moderna/pfizer and booster x 2  Preventative care: Last colonoscopy: 2013 DEXA 09/2019- osteoporosis -2.5- on fosamax - Dr. Lindi Adie is managing  MGM 09/18/2020  MRI breast planned in 03/2021  PAP 03/2019 at GYN,  Follows Dr. Marvel [facility]   Names of Other Physician/Practitioners you currently use: 1. [facility] here for primary care 2. Dr. Valetta Close, eye doctor, last visit 2022. q 6 months, wears glasses  3. Dr. Jason Coop, dentist, last visit 08/2020 4. Dr. Hardie Shackleton did dental implant 5. Dr. Amy Martinique, derm, last 10/2020   SURGICAL HISTORY She  has a past surgical history that includes Tonsillectomy (1968); Spine surgery (1998); Spine surgery (2001, 2010); Cervical disc surgery (1998); Lumbar disc surgery (2001, 2010); splenic aneurysm (02/10/2012); Colonoscopy (Jan. 7, 2014); visceral angiogram (N/A, 02/10/2012); Breast lumpectomy with radioactive seed and sentinel lymph node biopsy (Left, 08/29/2015); Re-excision of breast lumpectomy (Left, 09/07/2015); Portacath placement (Bilateral, 10/09/2015); Portacath removal; Laparoscopic bilateral salpingo oophorectomy (Bilateral, 05/16/2016); IR ANGIO INTRA EXTRACRAN SEL INTERNAL CAROTID BILAT MOD SED (02/03/2017); IR NEURO EACH ADD'L AFTER BASIC UNI RIGHT (MS) (02/03/2017); IR ANGIO VERTEBRAL SEL VERTEBRAL BILAT MOD SED (02/03/2017); IR ANGIO EXTERNAL CAROTID SEL EXT CAROTID BILAT MOD SED (02/03/2017); Radiology with anesthesia (N/A, 03/05/2017); IR NEURO EACH ADD'L AFTER BASIC UNI RIGHT (MS) (03/05/2017); IR Angiogram Follow Up Study (03/05/2017); IR Transcath/Emboliz (03/05/2017); IR ANGIO INTRA EXTRACRAN SEL COM CAROTID INNOMINATE UNI R MOD SED (03/05/2017); IR ANGIO EXTERNAL CAROTID SEL EXT CAROTID UNI R MOD SED (03/05/2017); Breast lumpectomy (Left, 2016); and dural AV fistula (03/2018).   FAMILY HISTORY Her family history includes Breast cancer in her cousin and maternal grandmother; Colon cancer in her maternal uncle; Deep  vein thrombosis in her mother; Diverticulitis in her mother; Heart Problems in her paternal grandfather; Heart attack in her father, paternal aunt, paternal grandmother, and paternal uncle; Heart defect in her maternal aunt; Hyperlipidemia in her mother; Hypertension in her father and mother; Leukemia in her maternal aunt; Lung cancer in her maternal uncle; Osteoporosis in her mother; Other in her father, mother, and sister; Pancreatic cancer in her maternal uncle; Stroke in her father, maternal grandfather, mother, paternal aunt, paternal grandmother, and paternal uncle; Vasculitis in her sister.   SOCIAL HISTORY She  reports that she has never smoked. She has never used smokeless tobacco. She reports previous alcohol use. She reports that she does not use drugs.   Review of Systems  Constitutional:  Negative for chills, fever, malaise/fatigue and weight loss.  HENT:  Negative for congestion, ear pain, nosebleeds and sore throat.   Eyes: Negative.   Respiratory:  Negative for cough, shortness of breath and wheezing.   Cardiovascular:  Negative for chest pain, palpitations and leg swelling.  Gastrointestinal:  Negative for blood in stool, constipation, diarrhea, heartburn, melena, nausea and vomiting.  Genitourinary:  Negative for dysuria, frequency and urgency.  Musculoskeletal:  Negative for back pain, falls, joint pain, myalgias and neck pain.  Skin:  Positive for rash (left ankle).  Neurological:  Negative for dizziness, sensory change, loss of consciousness and headaches.  Psychiatric/Behavioral:  Negative for depression. The patient is not nervous/anxious and does not have insomnia.     Objective:     BP (!) 142/82   Pulse 73   Temp 97.7 F (36.5 C)   Ht 5' 4"  (1.626 m)   Wt 162 lb (73.5 kg)   SpO2 99%   BMI 27.81 kg/m   General Appearance: Well nourished, alert, WD/WN, female and in no apparent distress. Eyes: PERRLA, EOMs, conjunctiva no swelling or erythema Sinuses: No  frontal/maxillary tenderness ENT/Mouth: EACs patent / TMs  nl. Nares clear without erythema, swelling, mucoid exudates. Oral hygiene is good. No erythema, swelling, or exudate. Tongue normal, non-obstructing. Tonsils not swollen or erythematous. Hearing normal.  Neck: Supple, thyroid normal. No bruits, nodes or JVD. Respiratory: Respiratory effort normal.  BS equal and clear bilateral without rales, rhonci, wheezing or stridor. Cardio: Heart sounds are normal with regular rate and rhythm and no murmurs, rubs or gallops. Peripheral pulses are normal and equal bilaterally without edema. No aortic or femoral bruits. Abdomen: Flat, soft  with nl bowel sounds. Nontender, no guarding, rebound, hernias, masses, or organomegaly.  Lymphatics: Non tender without lymphadenopathy.  Musculoskeletal: Full ROM all peripheral extremities, no deformity.  Skin: Warm and dry; without concerning lesions, cyanosis, clubbing or  ecchymosis. Multiple seborrheic keratosis across the chest and the abdomen. She has mild erythematous/petichial rash to L inner ankle, approx 4 cm area, improving per comparison to photo from yesterday. No heat, tenderness, distinct lesions.  Neuro: Cranial nerves intact, reflexes equal bilaterally. Normal muscle tone, no cerebellar symptoms. Sensation intact.  Pysch: Alert and oriented X 3, normal affect, Insight and Judgment appropriate.   EKG: NSR, No ST changes  The patient's weight, height, BMI  have been recorded in the chart.  I have made referrals, counseling, and provided education to the patient based on review of the above and I have provided the patient with a written personalized care plan for preventive services.      Izora Ribas, NP   02/07/2021

## 2021-02-12 ENCOUNTER — Ambulatory Visit: Payer: Medicare Other | Admitting: Adult Health

## 2021-03-18 ENCOUNTER — Other Ambulatory Visit: Payer: Self-pay

## 2021-03-18 ENCOUNTER — Ambulatory Visit
Admission: RE | Admit: 2021-03-18 | Discharge: 2021-03-18 | Disposition: A | Discharge status: Home / Self Care | Payer: Medicare Other | Source: Ambulatory Visit | Attending: Hematology and Oncology | Admitting: Hematology and Oncology

## 2021-03-18 DIAGNOSIS — Z1239 Encounter for other screening for malignant neoplasm of breast: Secondary | ICD-10-CM | POA: Diagnosis not present

## 2021-03-18 DIAGNOSIS — Z853 Personal history of malignant neoplasm of breast: Secondary | ICD-10-CM | POA: Diagnosis not present

## 2021-03-18 DIAGNOSIS — C50412 Malignant neoplasm of upper-outer quadrant of left female breast: Secondary | ICD-10-CM

## 2021-03-18 DIAGNOSIS — Z170 Estrogen receptor positive status [ER+]: Secondary | ICD-10-CM

## 2021-03-18 MED ORDER — GADOBUTROL 1 MMOL/ML IV SOLN
8.0000 mL | Freq: Once | INTRAVENOUS | Status: AC | PRN
  Administered 2021-03-18: 8 mL via INTRAVENOUS

## 2021-03-20 NOTE — Progress Notes (Signed)
 Patient Care Team: McKeown, William, MD as PCP - General (Internal Medicine) Brabham, Vance W, MD as Consulting Physician (Vascular Surgery) Hung, Patrick, MD as Consulting Physician (Gastroenterology) Jordan, Amy, MD as Consulting Physician (Dermatology) Gudena, Vinay, MD as Consulting Physician (Hematology and Oncology) Stoneburner, Sara, MD as Consulting Physician (Ophthalmology) Richardson, Kathy, MD as Consulting Physician (Obstetrics and Gynecology)  DIAGNOSIS:    ICD-10-CM   1. Malignant neoplasm of upper-outer quadrant of left breast in female, estrogen receptor positive (H[facility])  C50.412    Z17.0       SUMMARY OF ONCOLOGIC HISTORY: Oncology History  Breast cancer of upper-outer quadrant of left female breast ([facility])  08/03/2015 Mammogram   Left Breast: 6 mm mass @ 1 o clock    08/09/2015 Initial Diagnosis   Left breast 1:00 position: Invasive ductal carcinoma grade 1-2, ER 100%, PR 10%, HER-2 negative ratio 1.79, Ki-67 15%, T1 BN 0 stage I a clinical stage    08/29/2015 Surgery   Left lumpectomy ([place]): invasive ductal carcinoma 1.2 cm with LVID, with DCIS intermediate grade, 1 sentinel node positive, ER 100%, PR 10%, HER-2 negative ratio 1.79, Ki-67 15% , margins negative, Mammaprint high risk, luminal B, T1cN1 stage II a   08/29/2015 Procedure   Mammaprint: High-risk, Luminal type    09/07/2015 Surgery   Left breast re-excision ([facility]): ADH, no malignancy, negative margins.     09/12/2015 Procedure   BRCA2 mutation "[id number]." Also VUS on CHEK2.  Otherwise negative. Genes analyzed:  ATM, BARD1, BRCA1, BRCA2, BRIP1, CDH1, CHEK2, FANCC, MLH1, MSH2, MSH6, NBN, PALB2, PMS2, PTEN, RAD51C, RAD51D, TP53, and XRCC2; deletion/duplication analysis (without sequencing) for EPCAM.    10/11/2015 - 02/21/2016 Chemotherapy   Adjuvant chemotherapy with dose dense Adriamycin and Cytoxan 4 followed by Abraxane weekly 12   03/27/2016 - 04/23/2016 Radiation Therapy    Adjuvant radiation therapy ([place]). Left breast: 42.5 Gy in 17 fractions. Left breast boost: 7.5 Gy in 3 fractions.     05/16/2016 Surgery   Bilateral salpingo-oophorectomies: No cancer    06/05/2016 -  Anti-estrogen oral therapy   Anastrozole 1 mg by mouth daily      CHIEF COMPLIANT:  Follow-up of left breast cancer on anastrozole   INTERVAL HISTORY: Jacqueline Jenkins is a 70 y.o. with above-mentioned history of BRCA2 mutation positive left breast cancer who underwent left lumpectomy, adjuvant chemotherapy, radiation, and who is currently on antiestrogen therapy with anastrozole. Mammogram on 09/18/20 showed no evidence of malignancy bilaterally. MRI Breast 03/18/21 showed no evidence for malignancy bilaterally. She presents to the clinic today for follow-up.   ALLERGIES:  is allergic to lescol [fluvastatin sodium], lipitor [atorvastatin], and vytorin [ezetimibe-simvastatin].  MEDICATIONS:  Current Outpatient Medications  Medication Sig Dispense Refill   alendronate (FOSAMAX) 70 MG tablet TAKE 1 TABLET BY MOUTH ONCE A WEEK. TAKE WITH A FULL GLASS OF WATER ON AN EMPTY STOMACH. 12 tablet 3   anastrozole (ARIMIDEX) 1 MG tablet TAKE 1 TABLET BY MOUTH EVERY DAY AT 3PM 90 tablet 3   benazepril-hydrochlorthiazide (LOTENSIN HCT) 20-12.5 MG tablet TAKE 1 TABLET DAILY FOR BLOOD PRESSURE 90 tablet 3   Calcium Carb-Cholecalciferol (CALCIUM 1000 + D PO) Take 2 tablets by mouth daily. Gummies     naproxen sodium (ANAPROX) 220 MG tablet Take 220 mg by mouth daily as needed (pain).     REPATHA SURECLICK 140 MG/ML SOAJ INJECT 1 DOSE INTO THE SKIN EVERY 14 (FOURTEEN) DAYS. 2 mL 11   VITAMIN D PO Take 5,000 Units by mouth.   Takes 1 capsule every other day.     No current facility-administered medications for this visit.    PHYSICAL EXAMINATION: ECOG PERFORMANCE STATUS: 1 - Symptomatic but completely ambulatory  Vitals:   03/21/21 1347  BP: (!) 154/78  Pulse: 73  Resp: (!) 8  Temp: 97.7 F (36.5 C)   SpO2: 99%   Filed Weights   03/21/21 1347  Weight: 161 lb (73 kg)    BREAST: No palpable masses or nodules in either right or left breasts. No palpable axillary supraclavicular or infraclavicular adenopathy no breast tenderness or nipple discharge. (exam performed in the presence of a chaperone)  LABORATORY DATA:  I have reviewed the data as listed CMP Latest Ref Rng & Units 11/05/2020 08/01/2020 11/09/2019  Glucose 65 - 99 mg/dL 89 81 97  BUN 7 - 25 mg/dL 16 12 16  Creatinine 0.60 - 0.93 mg/dL 0.73 0.61 0.72  Sodium 135 - 146 mmol/L 133(L) 137 132(L)  Potassium 3.5 - 5.3 mmol/L 4.2 4.1 4.1  Chloride 98 - 110 mmol/L 97(L) 101 95(L)  CO2 20 - 32 mmol/L 28 27 27  Calcium 8.6 - 10.4 mg/dL 9.5 9.9 9.5  Total Protein 6.1 - 8.1 g/dL 7.1 7.1 -  Total Bilirubin 0.2 - 1.2 mg/dL 0.5 0.5 -  Alkaline Phos 33 - 130 U/L - - -  AST 10 - 35 U/L 21 26 -  ALT 6 - 29 U/L 17 23 -    Lab Results  Component Value Date   WBC 5.7 11/05/2020   HGB 13.3 11/05/2020   HCT 39.6 11/05/2020   MCV 91.0 11/05/2020   PLT 280 11/05/2020   NEUTROABS 4,115 11/05/2020    ASSESSMENT & PLAN:  Breast cancer of upper-outer quadrant of left female breast (HCC) Left lumpectomy 08/29/2015: Invasive ductal carcinoma 1.2 cm with LVID, with DCIS intermediate grade, 1 sentinel node positive, ER 100%, PR 10%, HER-2 negative ratio 1.79, Ki-67 15% , margins negative, Mammaprint high risk, luminal B, T1cN1 stage II a. Patient does not need axillary lymph node dissection based on current NCCN guidelines and ACOSOG Z 11 clinical trial; luminal type B and high-risk phenotype. Risk of relapse without chemotherapy 24% versus 12% with chemotherapy.   Treatment summary: 1. Dose dense Adriamycin and Cytoxan 4 followed by Abraxane weekly 12  started 10/11/2015 completed 02/21/2016 2. Adjuvant radiation therapy started 03/27/2016 completed 04/23/2016 3. Adjuvant antiestrogen therapy started  05/22/2016 ----------------------------------------------------------------------------------------------------------------- Current treatment: Antiestrogen therapy with anastrozole 1 mg daily to start 06/01/2016 BRCA2 mutation: Oophorectomy 05/16/2016: No malignancy   Anastrozole Toxicities:  Able to tolerate anastrozole fairly well. Denies any hot flashes or myalgias. The initial hot flashes have improved.   Surveillance of breast cancer: 1. Mammograms 03/21/2021: Benign, breast density category B 2. MRI breast 03/19/2021. (Because of BRCA2 mutation): No evidence of recurrent breast cancer,   Osteoporosis: Bone density January 2021: T score -2.5: Started Fosamax and calcium and vitamin D Recommended bisphosphonate therapy Return to clinic 1 year for follow-up    No orders of the defined types were placed in this encounter.  The patient has a good understanding of the overall plan. she agrees with it. she will call with any problems that may develop before the next visit here.  Total time spent: 20 mins including face to face time and time spent for planning, charting and coordination of care  Vinay K Gudena, MD, MPH 03/21/2021  I, Kirstyn Evans, am acting as scribe for Dr. Vinay Gudena.  I have reviewed the   above documentation for accuracy and completeness, and I agree with the above.       

## 2021-03-21 ENCOUNTER — Other Ambulatory Visit: Payer: Self-pay

## 2021-03-21 ENCOUNTER — Inpatient Hospital Stay: Payer: Medicare Other | Attending: Hematology and Oncology | Admitting: Hematology and Oncology

## 2021-03-21 DIAGNOSIS — C50412 Malignant neoplasm of upper-outer quadrant of left female breast: Secondary | ICD-10-CM | POA: Insufficient documentation

## 2021-03-21 DIAGNOSIS — Z170 Estrogen receptor positive status [ER+]: Secondary | ICD-10-CM

## 2021-03-21 DIAGNOSIS — Z923 Personal history of irradiation: Secondary | ICD-10-CM | POA: Diagnosis not present

## 2021-03-21 DIAGNOSIS — Z79811 Long term (current) use of aromatase inhibitors: Secondary | ICD-10-CM | POA: Insufficient documentation

## 2021-03-21 MED ORDER — ANASTROZOLE 1 MG PO TABS: ORAL_TABLET | ORAL | 3 refills | Status: DC

## 2021-03-21 NOTE — Assessment & Plan Note (Signed)
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 Patient does not need axillary lymph node dissection based on current NCCN guidelines and ACOSOG Z 11 clinical trial; luminal type B and high-risk phenotype. Risk of relapse without chemotherapy 24% versus 12% with chemotherapy.  Treatment summary: 1. Dose dense Adriamycin and Cytoxan 4 followed by Abraxane weekly 12 started 10/11/2015 completed 02/21/2016 2. Adjuvant radiation therapy started 03/27/2016 completed 04/23/2016 3. Adjuvant antiestrogen therapy started 05/22/2016 ----------------------------------------------------------------------------------------------------------------- Current treatment: Antiestrogen therapy with anastrozole 1 mg dailyto start 06/01/2016 BRCA2 mutation: Oophorectomy 05/16/2016: No malignancy  Anastrozole Toxicities: Able to tolerate anastrozole fairly well. Denies any hot flashes or myalgias. The initial hot flashes have improved.  Surveillance of breast cancer: 1. Mammograms7/21/2022: Benign, breast density category B 2.MRI breast7/19/2022. (Because of BRCA2 mutation): No evidence of recurrent breast cancer,  Osteoporosis: Bone density January 2021: T score -2.5: Started Fosamax and calcium and vitamin D Recommended bisphosphonate therapy Return to clinic 1 year for follow-up

## 2021-03-22 ENCOUNTER — Other Ambulatory Visit: Payer: Self-pay | Admitting: Hematology and Oncology

## 2021-03-22 DIAGNOSIS — Z1231 Encounter for screening mammogram for malignant neoplasm of breast: Secondary | ICD-10-CM

## 2021-04-19 ENCOUNTER — Other Ambulatory Visit: Payer: Self-pay | Admitting: *Deleted

## 2021-04-19 DIAGNOSIS — I728 Aneurysm of other specified arteries: Secondary | ICD-10-CM

## 2021-05-16 ENCOUNTER — Ambulatory Visit
Admission: RE | Admit: 2021-05-16 | Discharge: 2021-05-16 | Disposition: A | Discharge status: Home / Self Care | Payer: Medicare Other | Source: Ambulatory Visit | Attending: Surgery | Admitting: Surgery

## 2021-05-16 DIAGNOSIS — I728 Aneurysm of other specified arteries: Secondary | ICD-10-CM | POA: Diagnosis not present

## 2021-05-16 DIAGNOSIS — N289 Disorder of kidney and ureter, unspecified: Secondary | ICD-10-CM | POA: Diagnosis not present

## 2021-05-16 DIAGNOSIS — Q8909 Congenital malformations of spleen: Secondary | ICD-10-CM | POA: Diagnosis not present

## 2021-05-16 DIAGNOSIS — D1800 Hemangioma unspecified site: Secondary | ICD-10-CM | POA: Diagnosis not present

## 2021-05-16 MED ORDER — IOPAMIDOL (ISOVUE-370) INJECTION 76%
75.0000 mL | Freq: Once | INTRAVENOUS | Status: AC | PRN
  Administered 2021-05-16: 75 mL via INTRAVENOUS

## 2021-05-18 ENCOUNTER — Other Ambulatory Visit: Payer: Self-pay | Admitting: Adult Health

## 2021-05-20 ENCOUNTER — Ambulatory Visit (INDEPENDENT_AMBULATORY_CARE_PROVIDER_SITE_OTHER): Payer: Medicare Other | Admitting: Surgery

## 2021-05-20 ENCOUNTER — Encounter: Payer: Self-pay | Admitting: Surgery

## 2021-05-20 ENCOUNTER — Other Ambulatory Visit: Payer: Self-pay

## 2021-05-20 VITALS — BP 177/83 | HR 66 | Temp 98.1°F | Resp 20 | Ht 64.0 in | Wt 155.0 lb

## 2021-05-20 DIAGNOSIS — I728 Aneurysm of other specified arteries: Secondary | ICD-10-CM

## 2021-05-20 NOTE — Progress Notes (Signed)
[facility] [place]  Patient name: Jacqueline Jenkins MRN: [id number] DOB: 1951/05/30 Sex: female   REASON FOR VISIT:    Follow up  HISOTRY OF PRESENT ILLNESS:    Jacqueline Jenkins is a 70 y.o. female who in 2013 underwent coil embolization of a 5 cm splenic artery aneurysm.  Her aneurysm was detected by ultrasound for gallstones.  She has undergone treatment for breast cancer as well as closure of a dural fistula.  She also tells me that her sister recently died from autoimmune vasculitis   The patient suffers from hypercholesterolemia however she cannot take statin secondary to elevated liver enzymes.  She is a non-smoker.  She is medically managed for hypertension.   PAST MEDICAL HISTORY:   Past Medical History:  Diagnosis Date   Allergic rhinitis, cause unspecified    Anemia    history of anemia while teenager   BRCA2 positive    Breast cancer ([place]) 2016   Left Breast Cancer   Breast cancer of upper-outer quadrant of left female breast ([place])    chemo complete 01/2016, radiation 04/2016   DDD (degenerative disc disease)    Diverticulitis    Family history of breast cancer 08/30/2015   Dx. In maternal grandmother in her 61s-40; dx. In maternal first cousin in her 5s-60s    Family history of colon cancer 08/30/2015   Dx. In maternal uncle in his 17s    Hyperlipidemia    Hypertension    Left kidney mass 04/06/2018   Mild growth from Ct 2013 to 04/06/2018; Renal US 2020 no growth- will not follow up   Neuropathy    Osteopenia 12/2011   Spine T -0.4, Femur -2.1   Personal history of chemotherapy 2016   Left Breast Cancer   Personal history of radiation therapy 2016   Left Breast Cancer   PONV (postoperative nausea and vomiting)    Splenic artery aneurysm ([place]) 2013   COILS DONE   Vitamin D deficiency      FAMILY HISTORY:   Family History  Problem Relation Age of Onset   Stroke Mother    Osteoporosis Mother     Hyperlipidemia Mother    Hypertension Mother    Other Mother        varicose veins   Deep vein thrombosis Mother    Diverticulitis Mother    Breast cancer Maternal Grandmother        dx. 57s-40s; when pt's mother was 82 years old   Other Father        bleeding problems   Hypertension Father    Heart attack Father    Stroke Father    Other Sister        one sister had a hysterectomy for fibroids   Vasculitis Sister    Leukemia Maternal Aunt        dx. 69s-60s   Pancreatic cancer Maternal Uncle        dx. late 60s-early 70s; (x2 maternal uncles)   Heart attack Paternal Aunt    Stroke Paternal Aunt    Stroke Maternal Grandfather    Heart attack Paternal Grandmother    Stroke Paternal Grandmother    Heart Problems Paternal Grandfather    Colon cancer Maternal Uncle        dx. late 70s   Heart defect Maternal Aunt        possible   Breast cancer Cousin        dx. 50s-60s; maternal first  Heart attack Paternal Uncle    Stroke Paternal Uncle    Lung cancer Maternal Uncle        treated at [facility]; d. 51s    SOCIAL HISTORY:   Social History   Tobacco Use   Smoking status: Never   Smokeless tobacco: Never  Substance Use Topics   Alcohol use: Not Currently     ALLERGIES:   Allergies  Allergen Reactions   Lescol [Fluvastatin Sodium] Other (See Comments)    Myalgia   Lipitor [Atorvastatin] Other (See Comments)    Increased LFT's   Vytorin [Ezetimibe-Simvastatin] Other (See Comments)    myalgia     CURRENT MEDICATIONS:   Current Outpatient Medications  Medication Sig Dispense Refill   anastrozole (ARIMIDEX) 1 MG tablet TAKE 1 TABLET BY MOUTH EVERY DAY AT 3PM 90 tablet 3   benazepril-hydrochlorthiazide (LOTENSIN HCT) 20-12.5 MG tablet TAKE 1 TABLET BY MOUTH EVERY DAY FOR BLOOD PRESSURE 90 tablet 3   Calcium Carb-Cholecalciferol (CALCIUM 1000 + D PO) Take 2 tablets by mouth daily. Gummies     naproxen sodium (ANAPROX) 220 MG tablet Take 220 mg by mouth daily as  needed (pain).     REPATHA SURECLICK 240 MG/ML SOAJ INJECT 1 DOSE INTO THE SKIN EVERY 14 (FOURTEEN) DAYS. 2 mL 11   VITAMIN D PO Take 5,000 Units by mouth. Takes 1 capsule every other day.     No current facility-administered medications for this visit.    REVIEW OF SYSTEMS:   _0  denotes positive finding, _1  denotes negative finding Cardiac  Comments:  Chest pain or chest pressure:    Shortness of breath upon exertion:    Short of breath when lying flat:    Irregular heart rhythm:        Vascular    Pain in calf, thigh, or hip brought on by ambulation:    Pain in feet at night that wakes you up from your sleep:     Blood clot in your veins:    Leg swelling:         Pulmonary    Oxygen at home:    Productive cough:     Wheezing:         Neurologic    Sudden weakness in arms or legs:     Sudden numbness in arms or legs:     Sudden onset of difficulty speaking or slurred speech:    Temporary loss of vision in one eye:     Problems with dizziness:         Gastrointestinal    Blood in stool:     Vomited blood:         Genitourinary    Burning when urinating:     Blood in urine:        Psychiatric    Major depression:         Hematologic    Bleeding problems:    Problems with blood clotting too easily:        Skin    Rashes or ulcers:        Constitutional    Fever or chills:      PHYSICAL EXAM:   Vitals:   05/20/21 1353  BP: (!) 177/83  Pulse: 66  Resp: 20  Temp: 98.1 F (36.7 C)  SpO2: 99%  Weight: 155 lb (70.3 kg)  Height: _2  (1.626 m)    GENERAL: The patient is a well-nourished female, in no acute distress. The vital signs are  documented above. CARDIAC: There is a regular rate and rhythm.  VASCULAR: Palpable dorsalis pedis pulses PULMONARY: Non-labored respirations ABDOMEN: Soft and non-tender  MUSCULOSKELETAL: There are no major deformities or cyanosis. NEUROLOGIC: No focal weakness or paresthesias are detected. SKIN: There are no  ulcers or rashes noted. PSYCHIATRIC: The patient has a normal affect.  STUDIES:   I have reviewed the CT scan with the following findings:  VASCULAR   1. Slight interval decrease in size of successfully excluded splenic artery aneurysm now measuring a maximum of 4.1 x 3.7 cm compared to 4.4 x 3.9 cm previously. No evidence of flow within the excluded aneurysm sac. 2. Maintained patency of the distal splenic artery secondary to collateral flow through a hypertrophic pancreaticoduodenal arcade. 3. Trace atherosclerotic calcifications along the abdominal aorta.   NON-VASCULAR   1. A 2.2 cm enhancing lesion in hepatic segment 8 demonstrates imaging features consistent with a classic benign cavernous hemangioma. However, this lesion is new compared to prior imaging from January of 2017 and is not visible on more recent prior imaging which was obtained without intravenous contrast. Oddly, 2 additional small enhancing lesions in segments 7 and 2/3 are also noted with classic hemangioma imaging features, but these were visible on the prior imaging. Overall, this most likely represents a benign hemangioma. If the patient has a known history of gastrointestinal or neuroendocrine malignancy, or abnormal liver enzymes then an abdominal CT scan with gadolinium contrast could be considered for definitive evaluation. 2. 1.6 cm intermediate attenuation lesion in the interpolar aspect of the left kidney is also new compared to prior. Differential considerations include hemorrhagic/proteinaceous cyst (favored) versus a papillary renal neoplasm. This lesion could also be further evaluated by gadolinium-enhanced abdominal MRI with the hepatic abnormality described above. 3. Additional ancillary findings as above without significant interval change.   Signed, MEDICAL ISSUES:   Splenic artery aneurysm: CT scan today shows that the coiling of her splenic artery aneurysm remains intact without  opacification of contrast within the aneurysm sac.  The size is decreased from 5 cm down to approximately 4 cm.  I doubt this will ever cause her a problem.  She will follow-up in 3 years within the abdominal ultrasound  Abnormal CT scan findings: The patient tells me she has previously undergone work-up of her renal cyst which was benign.  We talked about the new spots on her liver, which are most likely benign hemangiomas.  My personal feeling is that she probably does not need these further evaluated.  She is going to talk with her primary care physician, Dr. Azell Der, MD, FACS [facility] [phone] Pager [phone]

## 2021-05-23 ENCOUNTER — Other Ambulatory Visit: Payer: Self-pay

## 2021-05-28 DIAGNOSIS — Z23 Encounter for immunization: Secondary | ICD-10-CM | POA: Diagnosis not present

## 2021-06-07 DIAGNOSIS — Z23 Encounter for immunization: Secondary | ICD-10-CM | POA: Diagnosis not present

## 2021-06-11 ENCOUNTER — Encounter: Payer: Self-pay | Admitting: Internal Medicine

## 2021-06-11 ENCOUNTER — Other Ambulatory Visit: Payer: Self-pay

## 2021-06-11 ENCOUNTER — Ambulatory Visit (INDEPENDENT_AMBULATORY_CARE_PROVIDER_SITE_OTHER): Payer: Medicare Other | Admitting: Internal Medicine

## 2021-06-11 VITALS — BP 140/84 | HR 71 | Temp 97.1°F | Resp 16 | Ht 64.0 in | Wt 154.0 lb

## 2021-06-11 DIAGNOSIS — Z79899 Other long term (current) drug therapy: Secondary | ICD-10-CM

## 2021-06-11 DIAGNOSIS — I10 Essential (primary) hypertension: Secondary | ICD-10-CM

## 2021-06-11 DIAGNOSIS — I700 Atherosclerosis of aorta: Secondary | ICD-10-CM | POA: Diagnosis not present

## 2021-06-11 DIAGNOSIS — E559 Vitamin D deficiency, unspecified: Secondary | ICD-10-CM

## 2021-06-11 DIAGNOSIS — R7309 Other abnormal glucose: Secondary | ICD-10-CM

## 2021-06-11 DIAGNOSIS — E782 Mixed hyperlipidemia: Secondary | ICD-10-CM

## 2021-06-11 MED ORDER — OLMESARTAN MEDOXOMIL-HCTZ 40-12.5 MG PO TABS: ORAL_TABLET | ORAL | 3 refills | Status: DC

## 2021-06-11 NOTE — Patient Instructions (Signed)

## 2021-06-11 NOTE — Progress Notes (Signed)
Future Appointments  Date Time Provider [place]  06/11/2021  2:30 PM Unk Pinto, MD [facility] None  09/20/2021  1:00 PM [facility] MM 2 [facility] GI-               11/05/2021         -   CPE  2:00 PM Liane Comber, NP [facility] None  02/12/2022        -  Wellness  9:00 AM Liane Comber, NP [facility] None  03/24/2022  1:45 PM Nicholas Lose, MD [facility] None    History of Present Illness:       This very nice 70 y.o. MWF presents for 3 month follow up with HTN, HLD, Pre-Diabetes and Vitamin D Deficiency. Patient is on Arimidex followed by Dr Lindi Adie for hx/o Lt Breast Lumpectomy (2016) tx'd Chemoradiation. Abd CT scan in 2021 revealed Aortic atherosclerosis.       Patient is treated for HTN (1996) & BP has been controlled at home. Today's BP was initially elevated & rechecked at goal  -  140/84. Patient has had no complaints of any cardiac type chest pain, palpitations, dyspnea / orthopnea / PND, dizziness, claudication, or dependent edema.       Patient has hx/o Statin myopathy and is on PCSK9i /Repatha q2weeks. Hyperlipidemia is controlled with diet & meds. Patient denies myalgias or other med SE's. Last Lipids were at goal:  Lab Results  Component Value Date   CHOL 167 11/05/2020   HDL 65 11/05/2020   LDLCALC 83 11/05/2020   TRIG 97 11/05/2020   CHOLHDL 2.6 11/05/2020     Also, the patient has history of PreDiabetes (A1c 6.2% /2016 and 5.7% /2016) and has had no symptoms of reactive hypoglycemia, diabetic polys, paresthesias or visual blurring.  Last A1c was normal & at goal:  Lab Results  Component Value Date   HGBA1C 5.5 11/05/2020                                                           Further, the patient also has history of Vitamin D Deficiency ("29" /2016) and supplements vitamin D without any suspected side-effects. Last vitamin D was low :  Lab Results  Component Value Date   VD25OH 46 11/05/2020     Current Outpatient Medications on  File Prior to Visit  Medication Sig   anastrozole (ARIMIDEX) 1 MG tablet TAKE 1 TABLET  EVERY DAY AT 3PM   CALCIUM 1000 + D  Take 2 tablets daily   naproxen 220 MG tablet Take 220 mg by mouth daily as needed   REPATHA SURECLICK 818 MG/ML  INJECT 1 DOSE INTO THE SKIN EVERY 14  DAYS.   VITAMIN D  5,000 Units Take Takes 1 capsule every other day.      Allergies  Allergen Reactions   Lescol [Fluvastatin Sodium] Other (See Comments)    Myalgia   Lipitor [Atorvastatin] Other (See Comments)    Increased LFT's   Vytorin [Ezetimibe-Simvastatin] Other (See Comments)    myalgia     PMHx:   Past Medical History:  Diagnosis Date   Allergic rhinitis, cause unspecified    Anemia    history of anemia while teenager   BRCA2 positive    Breast cancer ([place]) 2016  Left Breast Cancer   Breast cancer of upper-outer quadrant of left female breast [facility])    chemo complete 01/2016, radiation 04/2016   DDD (degenerative disc disease)    Diverticulitis    Family history of breast cancer 08/30/2015   Dx. In maternal grandmother in her 75s-40; dx. In maternal first cousin in her 61s-60s    Family history of colon cancer 08/30/2015   Dx. In maternal uncle in his 32s    Hyperlipidemia    Hypertension    Left kidney mass 04/06/2018   Mild growth from Ct 2013 to 04/06/2018; Renal US 2020 no growth- will not follow up   Neuropathy    Osteopenia 12/2011   Spine T -0.4, Femur -2.1   Personal history of chemotherapy 2016   Left Breast Cancer   Personal history of radiation therapy 2016   Left Breast Cancer   PONV (postoperative nausea and vomiting)    Splenic artery aneurysm ([place]) 2013   COILS DONE   Vitamin D deficiency      Immunization History  Administered Date(s) Administered   DT (Pediatric) 01/22/2015   Influenza Split 06/08/2014, 06/02/2016   Influenza, High Dose Seasonal PF 06/08/2014, 05/27/2017, 06/01/2018, 06/02/2019, 06/01/2020   Influenza-Unspecified 06/14/2013, 06/04/2015,  05/27/2017   Meningococcal Conjugate 09/02/2011   Moderna Sars-Covid-2 Vaccination 10/17/2019, 11/15/2019   PFIZER(Purple Top)SARS-COV-2 Vaccination 06/27/2020, 01/22/2021   PPD Test 01/18/2014   Pneumococcal Conjugate-13 12/31/2011, 06/17/2017   Pneumococcal Polysaccharide-23 09/02/2011   Pneumococcal-Unspecified 09/22/1995, 09/02/2011   Td 11/27/1994, 04/29/2004, 01/22/2015     Past Surgical History:  Procedure Laterality Date   BREAST LUMPECTOMY Left 2016   BREAST LUMPECTOMY WITH RADIOACTIVE SEED AND SENTINEL LYMPH NODE BIOPSY Left 08/29/2015   Procedure: LEFT BREAST LUMPECTOMY WITH RADIOACTIVE SEED WITH AXILLARY SENTINEL LYMPH NODE BIOPSY;  Surgeon: Excell Seltzer, MD;  Location: [place];  Service: General;  Laterality: Left;   [place]   COLONOSCOPY  Jan. 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   Dural AV fistula repair   IR ANGIO EXTERNAL CAROTID SEL EXT CAROTID BILAT MOD SED  02/03/2017   IR ANGIO EXTERNAL CAROTID SEL EXT CAROTID UNI R MOD SED  03/05/2017   IR ANGIO INTRA EXTRACRAN SEL COM CAROTID INNOMINATE UNI R MOD SED  03/05/2017   IR ANGIO INTRA EXTRACRAN SEL INTERNAL CAROTID BILAT MOD SED  02/03/2017   IR ANGIO VERTEBRAL SEL VERTEBRAL BILAT MOD SED  02/03/2017   IR ANGIOGRAM FOLLOW UP STUDY  03/05/2017   IR NEURO EACH ADD'L AFTER BASIC UNI RIGHT (MS)  02/03/2017   IR NEURO EACH ADD'L AFTER BASIC UNI RIGHT (MS)  03/05/2017   IR TRANSCATH/EMBOLIZ  03/05/2017   LAPAROSCOPIC BILATERAL SALPINGO OOPHERECTOMY Bilateral 05/16/2016   Procedure: LAPAROSCOPIC BILATERAL SALPINGO OOPHORECTOMY;  Surgeon: Paula Compton, MD;  Location: [facility];  Service: Gynecology;  Laterality: Bilateral;   LUMBAR DISC SURGERY  2001, 2010   PORTACATH PLACEMENT Bilateral 10/09/2015   Procedure: INSERTION PORT-A-CATH;  Surgeon: Excell Seltzer, MD;  Location: [facility];  Service: General;  Laterality: Bilateral;   Portacath removal     RADIOLOGY WITH ANESTHESIA N/A 03/05/2017    Procedure: ARTERIOGRAM ONYX EMBOLIZATION OF FISTULA;  Surgeon: Consuella Lose, MD;  Location: [place];  Service: Radiology;  Laterality: N/A;   RE-EXCISION OF BREAST LUMPECTOMY Left 09/07/2015   Procedure: RE-EXCISION OF LEFT BREAST LUMPECTOMY;  Surgeon: Excell Seltzer, MD;  Location: [place];  Service: General;  Laterality: Left;   SPINE  SURGERY  1998   cervical diskectomy   SPINE SURGERY  2001, 2010   Lumbar diskectomy   splenic aneurysm  02/10/2012   Aneurysm of splenic artery, coil placed, Dr. Arita Miss   TONSILLECTOMY  1968   VISCERAL ANGIOGRAM N/A 02/10/2012   Procedure: VISCERAL ANGIOGRAM;  Surgeon: Serafina Mitchell, MD;  Location: [facility] CATH LAB;  Service: Cardiovascular;  Laterality: N/A;    FHx:    Reviewed / unchanged  SHx:    Reviewed / unchanged   Systems Review:  Constitutional: Denies fever, chills, wt changes, headaches, insomnia, fatigue, night sweats, change in appetite. Eyes: Denies redness, blurred vision, diplopia, discharge, itchy, watery eyes.  ENT: Denies discharge, congestion, post nasal drip, epistaxis, sore throat, earache, hearing loss, dental pain, tinnitus, vertigo, sinus pain, snoring.  CV: Denies chest pain, palpitations, irregular heartbeat, syncope, dyspnea, diaphoresis, orthopnea, PND, claudication or edema. Respiratory: denies cough, dyspnea, DOE, pleurisy, hoarseness, laryngitis, wheezing.  Gastrointestinal: Denies dysphagia, odynophagia, heartburn, reflux, water brash, abdominal pain or cramps, nausea, vomiting, bloating, diarrhea, constipation, hematemesis, melena, hematochezia  or hemorrhoids. Genitourinary: Denies dysuria, frequency, urgency, nocturia, hesitancy, discharge, hematuria or flank pain. Musculoskeletal: Denies arthralgias, myalgias, stiffness, jt. swelling, pain, limping or strain/sprain.  Skin: Denies pruritus, rash, hives, warts, acne, eczema or change in skin lesion(s). Neuro: No weakness, tremor, incoordination,  spasms, paresthesia or pain. Psychiatric: Denies confusion, memory loss or sensory loss. Endo: Denies change in weight, skin or hair change.  Heme/Lymph: No excessive bleeding, bruising or enlarged lymph nodes.  Physical Exam  BP 140/84   Pulse 71   Temp (!) 97.1 F (36.2 C)   Resp 16   Ht 5' 4"  (1.626 m)   Wt 154 lb (69.9 kg)   SpO2 97%   BMI 26.43 kg/m   Appears  well nourished, well groomed  and in no distress.  Eyes: PERRLA, EOMs, conjunctiva no swelling or erythema. Sinuses: No frontal/maxillary tenderness ENT/Mouth: EAC's clear, TM's nl w/o erythema, bulging. Nares clear w/o erythema, swelling, exudates. Oropharynx clear without erythema or exudates. Oral hygiene is good. Tongue normal, non obstructing. Hearing intact.  Neck: Supple. Thyroid not palpable. Car 2+/2+ without bruits, nodes or JVD. Chest: Respirations nl with BS clear & equal w/o rales, rhonchi, wheezing or stridor.  Cor: Heart sounds normal w/ regular rate and rhythm without sig. murmurs, gallops, clicks or rubs. Peripheral pulses normal and equal  without edema.  Abdomen: Soft & bowel sounds normal. Non-tender w/o guarding, rebound, hernias, masses or organomegaly.  Lymphatics: Unremarkable.  Musculoskeletal: Full ROM all peripheral extremities, joint stability, 5/5 strength and normal gait.  Skin: Warm, dry without exposed rashes, lesions or ecchymosis apparent.  Neuro: Cranial nerves intact, reflexes equal bilaterally. Sensory-motor testing grossly intact. Tendon reflexes grossly intact.  Pysch: Alert & oriented x 3.  Insight and judgement nl & appropriate. No ideations.  Assessment and Plan:  1. Essential hypertension  - Continue medication, monitor blood pressure at home.  - Continue DASH diet.  Reminder to go to the ER if any CP,  SOB, nausea, dizziness, severe HA, changes vision/speech.    - CBC with Differential/Platelet - COMPLETE METABOLIC PANEL WITH GFR - Magnesium - TSH -  olmesartan-hydrochlorothiazide 40-12.5 MG tablet;  Take  1 tablet  every Morning for BP   Dispense: 90 tablet; Refill: 3  2 . Hyperlipidemia, mixed  - Continue diet/meds, exercise,& lifestyle modifications.  - Continue monitor periodic cholesterol/liver & renal functions   - TSH  3. Abnormal glucose  - Continue diet, exercise  -  Lifestyle modifications.  - Monitor appropriate labs   - Fructosamine - Insulin, random  4. Vitamin D deficiency  - Continue supplementation.    5. Aortic atherosclerosis ([place]) by Abd CT scan in 2021   6. Medication management - CBC with Differential/Platelet - COMPLETE METABOLIC PANEL WITH GFR - Magnesium - TSH - Fructosamine - Insulin, random        Discussed  regular exercise, BP monitoring, weight control to achieve/maintain BMI less than 25 and discussed med and SE's. Recommended labs to assess and monitor clinical status with further disposition pending results of labs.  I discussed the assessment and treatment plan with the patient. The patient was provided an opportunity to ask questions and all were answered. The patient agreed with the plan and demonstrated an understanding of the instructions.  I provided over 30 minutes of exam, counseling, chart review and  complex critical decision making.        The patient was advised to call back or seek an in-person evaluation if the symptoms worsen or if the condition fails to improve as anticipated.   Kirtland Bouchard, MD

## 2021-06-12 NOTE — Progress Notes (Signed)
============================================================ -   Test results slightly outside the reference range are not unusual. If there is anything important, I will review this with you,  otherwise it is considered normal test values.  If you have further questions,  please do not hesitate to contact me at the office or via My Chart.  ============================================================ ============================================================  -   Magnesium  -   1.8   -  very  low- goal is betw 2.0 - 2.5,   - So..............Marland Kitchen  Recommend that you take  Magnesium 500 mg tablet daily   - also important to eat lots of  leafy green vegetables   - spinach - Kale - collards - greens - okra - asparagus  - broccoli - quinoa - squash - almonds   - black, red, white beans  -  peas - green beans ============================================================ ============================================================  - All Else - CBC - Kidneys - Electrolytes - Liver - Magnesium & Thyroid    - all  Normal / OK ===========================================================

## 2021-06-15 ENCOUNTER — Encounter: Payer: Self-pay | Admitting: Internal Medicine

## 2021-06-16 LAB — CBC WITH DIFFERENTIAL/PLATELET
Absolute Monocytes: 352 cells/uL (ref 200–950)
Basophils Absolute: 92 cells/uL (ref 0–200)
Basophils Relative: 1.8 %
Eosinophils Absolute: 143 cells/uL (ref 15–500)
Eosinophils Relative: 2.8 %
HCT: 40.3 % (ref 35.0–45.0)
Hemoglobin: 13.8 g/dL (ref 11.7–15.5)
Lymphs Abs: 862 cells/uL (ref 850–3900)
MCH: 31.7 pg (ref 27.0–33.0)
MCHC: 34.2 g/dL (ref 32.0–36.0)
MCV: 92.4 fL (ref 80.0–100.0)
MPV: 10.6 fL (ref 7.5–12.5)
Monocytes Relative: 6.9 %
Neutro Abs: 3652 cells/uL (ref 1500–7800)
Neutrophils Relative %: 71.6 %
Platelets: 274 10*3/uL (ref 140–400)
RBC: 4.36 10*6/uL (ref 3.80–5.10)
RDW: 12 % (ref 11.0–15.0)
Total Lymphocyte: 16.9 %
WBC: 5.1 10*3/uL (ref 3.8–10.8)

## 2021-06-16 LAB — INSULIN, RANDOM: Insulin: 8.3 u[IU]/mL

## 2021-06-16 LAB — MAGNESIUM: Magnesium: 1.8 mg/dL (ref 1.5–2.5)

## 2021-06-16 LAB — COMPLETE METABOLIC PANEL WITH GFR
AG Ratio: 1.7 (calc) (ref 1.0–2.5)
ALT: 21 U/L (ref 6–29)
AST: 25 U/L (ref 10–35)
Albumin: 4.5 g/dL (ref 3.6–5.1)
Alkaline phosphatase (APISO): 75 U/L (ref 37–153)
BUN: 9 mg/dL (ref 7–25)
CO2: 26 mmol/L (ref 20–32)
Calcium: 9.7 mg/dL (ref 8.6–10.4)
Chloride: 96 mmol/L — ABNORMAL LOW (ref 98–110)
Creat: 0.63 mg/dL (ref 0.60–1.00)
Globulin: 2.6 g/dL (calc) (ref 1.9–3.7)
Glucose, Bld: 83 mg/dL (ref 65–99)
Potassium: 4.2 mmol/L (ref 3.5–5.3)
Sodium: 132 mmol/L — ABNORMAL LOW (ref 135–146)
Total Bilirubin: 0.5 mg/dL (ref 0.2–1.2)
Total Protein: 7.1 g/dL (ref 6.1–8.1)
eGFR: 94 mL/min/{1.73_m2} (ref >=60)

## 2021-06-16 LAB — FRUCTOSAMINE: Fructosamine: 261 umol/L (ref 205–285)

## 2021-06-16 LAB — TSH: TSH: 1.03 mIU/L (ref 0.40–4.50)

## 2021-06-24 ENCOUNTER — Other Ambulatory Visit: Payer: Self-pay | Admitting: Internal Medicine

## 2021-06-24 DIAGNOSIS — I10 Essential (primary) hypertension: Secondary | ICD-10-CM

## 2021-06-24 MED ORDER — VALSARTAN-HYDROCHLOROTHIAZIDE 80-12.5 MG PO TABS: ORAL_TABLET | ORAL | 3 refills | Status: DC

## 2021-06-27 ENCOUNTER — Other Ambulatory Visit: Payer: Self-pay | Admitting: Internal Medicine

## 2021-06-27 MED ORDER — BENAZEPRIL HCL 40 MG PO TABS: ORAL_TABLET | ORAL | 3 refills | Status: DC

## 2021-08-01 ENCOUNTER — Ambulatory Visit: Payer: Medicare Other | Admitting: Adult Health

## 2021-09-20 ENCOUNTER — Ambulatory Visit
Admission: RE | Admit: 2021-09-20 | Discharge: 2021-09-20 | Disposition: A | Discharge status: Home / Self Care | Payer: Medicare Other | Source: Ambulatory Visit | Attending: Hematology and Oncology | Admitting: Hematology and Oncology

## 2021-09-20 DIAGNOSIS — Z1231 Encounter for screening mammogram for malignant neoplasm of breast: Secondary | ICD-10-CM | POA: Diagnosis not present

## 2021-09-26 ENCOUNTER — Other Ambulatory Visit: Payer: Self-pay | Admitting: Internal Medicine

## 2021-11-01 DIAGNOSIS — Z6826 Body mass index (BMI) 26.0-26.9, adult: Secondary | ICD-10-CM | POA: Insufficient documentation

## 2021-11-01 NOTE — Progress Notes (Signed)
Patient ID: Jacqueline Jenkins, female   DOB: Mar 25, 1951, 71 y.o.   MRN: [id number]   CPE  Assessment:   Encounter for Annual Physical Exam with abnormal findings Due annually  Health Maintenance reviewed Healthy lifestyle reviewed and goals set  Discussed shingrix - check with insurance Check with GYN about PAP smears Check with Dr. Lindi Adie about DEXA follow up, otherwise we can take over ordering  Atherosclerosis of aorta [facility]) - per abd CT 2021 Control blood pressure, cholesterol, glucose, increase exercise.   Aneurysm of splenic artery [facility]) Vascular Dr. Trula Slade following, [place] planned 2025 Control blood pressure, cholesterol, glucose, increase exercise.   Essential hypertension - med changed today, close follow up in 4 weeks for recheck - DASH diet, exercise and monitor at home. Call if persistently greater than 130/80.  -     CBC with Differential/Platelet -defer -     CMP/GFR   Hyperlipidemia/statin myopathy  - now on PCSK9 and doing well - Dr. Debara Pickett follows - check lipids, decrease fatty foods, increase activity.  -     Lipid panel -     TSH  Hx of prediabetes Recent A1Cs at goal Discussed diet/exercise, weight management  - A1C annually  Medication management -     Magnesium - defer  Malignant neoplasm of upper-outer quadrant of left breast in female, estrogen receptor positive ([place]) Continue follow up Dr. Lindi Adie On Anastrozole UTD mammogram, alternating q18mwith MRI  Vitamin D deficiency Continue supplement for goal 60-100;  Check Vitamin D annually  Dural arteriovenous fistula S/p repair; monitor for recurrent sx  Orders Placed This Encounter  Procedures   CBC with Differential/Platelet   COMPLETE METABOLIC PANEL WITH GFR   Magnesium   Lipid panel   TSH   Hemoglobin A1c   VITAMIN D 25 Hydroxy (Vit-D Deficiency, Fractures)   Microalbumin / creatinine urine ratio   Urinalysis, Routine w reflex microscopic   Cologuard   EKG 12-Lead     Future  Appointments  Date Time Provider [place] 12/03/2021  2:30 PM MMagda Bernheim NP [facility] None  02/12/2022  9:00 AM CLiane Comber NP [facility] None  03/24/2022  1:45 PM GNicholas Lose MD [facility] None    Subjective:   Jacqueline SABATINOis a 71y.o.  female who presents for CPE. She has Aneurysm of splenic artery ([place]; Allergic rhinitis; Hyperlipidemia, mixed; Lumbar degenerative disc disease; Vitamin D deficiency; Abnormal glucose; Medication management; Essential hypertension; Breast cancer of upper-outer quadrant of left female breast ([place]; Genetic testing; BRCA2 positive; Dural arteriovenous fistula; Osteoporosis; FHx: heart disease; Aortic atherosclerosis ([place] by Abd CT scan in 2021; Statin myopathy; and BMI 26.0-26.9,adult on their problem list.  She is primary caregiver for husband with supranuclear palsy/Parkinson's, needs full time care. Daughter is currently with him.   She has history of L breast lumpectomy (+ER/+PR/HER2 Neg)  in Dec 2016 followed by chemoradiation, underwent BSO due to + BRCA2 mutation, on anastrazole with plan to continue, and follows with Dr. GLindi Adie DEXA 09/08/2019 showed T score -2.5 by Dr. GLindi Adie has been on fosamax weekly without SE. She had reassuring mammogram 09/20/2021. Will have MRI in July 2023.   She had dural AV fistula repaired by neurosurgery in 2019 without issues and was released unless recurrent issue.   BMI is Body mass index is 26.19 kg/m., she has been working on diet and exercise. Generally making good choices. Walking 10 min daily on her driveway.  Wt Readings from Last 3 Encounters:  11/05/21  152 lb 9.6 oz (69.2 kg)  06/11/21 154 lb (69.9 kg)  05/20/21 155 lb (70.3 kg)   She follows with Dr. Trula Slade for large 5 cm splenic artery aneurysm,  she underwent: Embolization with coil placement in June 2013, She had CTA follow up 05/16/2021 with interval crease down to 4 cm. He is planning 3 year follow up with abd [place].  CT also  incidentally showed renal cyst which has been worked up in the past and concluded benign. Liver spots felt most likely hemangiomas.    Her blood pressure has been controlled at home (120-130s/70s), today their BP is BP: (!) 160/80,  She does workout, but active caring for husband She denies chest pain, shortness of breath, dizziness. No leg swelling.  She is on cholesterol medication, hx of numerous intolerances, now on repatha via Dr. Debara Pickett, does self injections and doing well. Had cardiac calcium score with score of 8 at 50% with her age, and she had aortic atherosclerosis per abd CT 2021. Her cholesterol is at goal of LDL <100. The cholesterol last visit was:  Lab Results  Component Value Date   CHOL 167 11/05/2020   HDL 65 11/05/2020   LDLCALC 83 11/05/2020   TRIG 97 11/05/2020   CHOLHDL 2.6 11/05/2020   She has had prediabetes, controlled by lifestyle  She denies foot ulcerations, hyperglycemia, hypoglycemia , increased appetite, nausea, paresthesia of the feet, polydipsia, polyuria, visual disturbances, vomiting and weight loss.  Last A1C in the office was:  Lab Results  Component Value Date   HGBA1C 5.5 11/05/2020    Last GFR:  Lab Results  Component Value Date   GFRNONAA 83 11/05/2020   Patient is on Vitamin D supplement,  Taking 5000 IU every other day, also 1000 IU daily in multivitamin Lab Results  Component Value Date   VD25OH 46 11/05/2020        Medication Review:   Current Outpatient Medications (Cardiovascular):    olmesartan-hydrochlorothiazide (BENICAR HCT) 40-12.5 MG tablet, Take 1 tab daily in the morning for blood pressure.   REPATHA SURECLICK 553 MG/ML SOAJ, INJECT 1 DOSE INTO SKIN EVERY 14 DAYS   Current Outpatient Medications (Analgesics):    naproxen sodium (ANAPROX) 220 MG tablet, Take 220 mg by mouth daily as needed (pain).   Current Outpatient Medications (Other):    anastrozole (ARIMIDEX) 1 MG tablet, TAKE 1 TABLET BY MOUTH EVERY DAY AT 3PM    Calcium Carb-Cholecalciferol (CALCIUM 1000 + D PO), Take 2 tablets by mouth daily. Gummies   VITAMIN D PO, Take 5,000 Units by mouth. Takes 1 capsule every other day.  Current Problems (verified) Patient Active Problem List   Diagnosis Date Noted   BMI 26.0-26.9,adult 11/01/2021   Statin myopathy 08/01/2020   Aortic atherosclerosis ([place]) by Abd CT scan in 2021 07/31/2020   FHx: heart disease 12/28/2017   Osteoporosis 06/17/2017   Dural arteriovenous fistula 03/05/2017   Genetic testing 09/19/2015   BRCA2 positive 09/19/2015   Breast cancer of upper-outer quadrant of left female breast ([place]) 08/21/2015   Medication management 06/07/2015   Essential hypertension 06/07/2015   Abnormal glucose 11/16/2013   Allergic rhinitis    Hyperlipidemia, mixed    Lumbar degenerative disc disease    Vitamin D deficiency    Aneurysm of splenic artery ([facility]) 01/19/2012    Allergies Allergies  Allergen Reactions   Lescol [Fluvastatin Sodium] Other (See Comments)    Myalgia   Lipitor [Atorvastatin] Other (See Comments)    Increased LFT's  Vytorin [Ezetimibe-Simvastatin] Other (See Comments)    myalgia     Immunization History  Administered Date(s) Administered   DT (Pediatric) 01/22/2015   Influenza Split 06/08/2014, 06/02/2016   Influenza, High Dose Seasonal PF 06/08/2014, 05/27/2017, 06/01/2018, 06/02/2019, 06/01/2020   Influenza-Unspecified 06/14/2013, 06/04/2015, 05/27/2017   Meningococcal Conjugate 09/02/2011   Moderna Sars-Covid-2 Vaccination 10/17/2019, 11/15/2019   PFIZER(Purple Top)SARS-COV-2 Vaccination 06/27/2020, 01/22/2021   PPD Test 01/18/2014   Pneumococcal Conjugate-13 12/31/2011, 06/17/2017   Pneumococcal Polysaccharide-23 09/02/2011   Pneumococcal-Unspecified 09/22/1995, 09/02/2011   Td 11/27/1994, 04/29/2004, 01/22/2015   Health Maintenance  Topic Date Due   Zoster Vaccines- Shingrix (1 of 2) Never done   PAP SMEAR-Modifier  03/01/2021   DEXA SCAN  09/07/2021    COLONOSCOPY (Pts 45-7yr Insurance coverage will need to be confirmed)  01/14/2022   MAMMOGRAM  09/20/2022   TETANUS/TDAP  01/21/2025   INFLUENZA VACCINE  Completed   COVID-19 Vaccine  Completed   Hepatitis C Screening  Completed   HPV VACCINES  Aged Out   Pneumonia Vaccine 71 Years old  Discontinued   Shingrix: discussed, cost, check with insurance with new year policy   Preventative care: Last colonoscopy: 12/2011, never abnormal per patient, would like to do cologuard  DEXA 09/2019- osteoporosis -2.5- on fosamax - Dr. GLindi Adieis managing, encouraged to discuss MGM: annually at breast center, Dr. GLindi Adiefollows MRI breast annually alternates with mmg due to lifetime elevated risk of breast cancer  PAP q2y due to elevated risk, last 03/2019 with GYN, optional from here on out was advised per patient, she will call to discuss Follows Dr. RMarvel PlanGYN   Names of Other Physician/Practitioners you currently use: 1. [facility] here for primary care 2. Dr. BValetta Close eye doctor, last visit 2022. q 6 months, wears glasses  3. Dr. DJason Coop dentist, last visit 2022, has scheduled 4. Dr. RHardie Shackletondid dental implant 5. Dr. Amy JMartinique derm, last 10/2020, has scheduled    SURGICAL HISTORY She  has a past surgical history that includes Tonsillectomy (1968); Spine surgery (1998); Spine surgery (2001, 2010); Cervical disc surgery (1998); Lumbar disc surgery (2001, 2010); splenic aneurysm (02/10/2012); Colonoscopy (Jan. 7, 2014); visceral angiogram (N/A, 02/10/2012); Breast lumpectomy with radioactive seed and sentinel lymph node biopsy (Left, 08/29/2015); Re-excision of breast lumpectomy (Left, 09/07/2015); Portacath placement (Bilateral, 10/09/2015); Portacath removal; Laparoscopic bilateral salpingo oophorectomy (Bilateral, 05/16/2016); IR ANGIO INTRA EXTRACRAN SEL INTERNAL CAROTID BILAT MOD SED (02/03/2017); IR NEURO EACH ADD'L AFTER BASIC UNI RIGHT (MS) (02/03/2017);  IR ANGIO VERTEBRAL SEL VERTEBRAL BILAT MOD SED (02/03/2017); IR ANGIO EXTERNAL CAROTID SEL EXT CAROTID BILAT MOD SED (02/03/2017); Radiology with anesthesia (N/A, 03/05/2017); IR NEURO EACH ADD'L AFTER BASIC UNI RIGHT (MS) (03/05/2017); IR Angiogram Follow Up Study (03/05/2017); IR Transcath/Emboliz (03/05/2017); IR ANGIO INTRA EXTRACRAN SEL COM CAROTID INNOMINATE UNI R MOD SED (03/05/2017); IR ANGIO EXTERNAL CAROTID SEL EXT CAROTID UNI R MOD SED (03/05/2017); Breast lumpectomy (Left, 2016); and dural AV fistula (03/2018).   FAMILY HISTORY Her family history includes Breast cancer in her cousin and maternal grandmother; Colon cancer in her maternal uncle; Deep vein thrombosis in her mother; Diverticulitis in her mother; Heart Problems in her paternal grandfather; Heart attack in her father, paternal aunt, paternal grandmother, and paternal uncle; Heart defect in her maternal aunt; Hyperlipidemia in her mother; Hypertension in her father and mother; Leukemia in her maternal aunt; Lung cancer in her maternal uncle; Osteoporosis in her mother; Other in her father, mother, and sister; Pancreatic cancer  in her maternal uncle; Stroke in her father, maternal grandfather, mother, paternal aunt, paternal grandmother, and paternal uncle; Vasculitis in her sister.   SOCIAL HISTORY She  reports that she has never smoked. She has never used smokeless tobacco. She reports that she does not currently use alcohol. She reports that she does not use drugs.   Review of Systems  Constitutional:  Negative for malaise/fatigue and weight loss.  HENT:  Negative for hearing loss and tinnitus.   Eyes:  Negative for blurred vision and double vision.  Respiratory:  Negative for cough, shortness of breath and wheezing.   Cardiovascular:  Negative for chest pain, palpitations, orthopnea, claudication and leg swelling.  Gastrointestinal:  Negative for abdominal pain, blood in stool, constipation, diarrhea, heartburn, melena, nausea and vomiting.   Genitourinary: Negative.   Musculoskeletal:  Negative for joint pain and myalgias.  Skin:  Negative for rash.  Neurological:  Negative for dizziness, tingling, sensory change, weakness and headaches.  Endo/Heme/Allergies:  Negative for polydipsia.  Psychiatric/Behavioral: Negative.    All other systems reviewed and are negative.   Objective:     BP (!) 160/80    Pulse 81    Temp 97.7 F (36.5 C)    Ht _0  (1.626 m)    Wt 152 lb 9.6 oz (69.2 kg)    SpO2 99%    BMI 26.19 kg/m   General Appearance: Well nourished, alert, WD/WN, female and in no apparent distress. Eyes: PERRLA, EOMs, conjunctiva no swelling or erythema Sinuses: No frontal/maxillary tenderness ENT/Mouth: EACs patent / TMs  nl. Nares clear without erythema, swelling, mucoid exudates. Oral hygiene is good. No erythema, swelling, or exudate. Tongue normal, non-obstructing. Tonsils not swollen or erythematous. Hearing normal.  Neck: Supple, thyroid normal. No bruits, nodes or JVD. Respiratory: Respiratory effort normal.  BS equal and clear bilateral without rales, rhonci, wheezing or stridor. Cardio: Heart sounds are normal with regular rate and rhythm and no murmurs, rubs or gallops. Peripheral pulses are normal and equal bilaterally without edema. No aortic or femoral bruits. Abdomen: Flat, soft  with nl bowel sounds. Nontender, no guarding, rebound, hernias, masses, or organomegaly.  Lymphatics: Non tender without lymphadenopathy.  Musculoskeletal: Full ROM all peripheral extremities, no deformity.  Skin: Warm and dry; without concerning lesions, cyanosis, clubbing or  ecchymosis. Multiple seborrheic keratosis across the chest and the abdomen. She has mild erythematous/petichial rash to L inner ankle, approx 4 cm area, improving per comparison to photo from yesterday. No heat, tenderness, distinct lesions.  Neuro: Cranial nerves intact, reflexes equal bilaterally. Normal muscle tone, no cerebellar symptoms. Sensation intact.   Pysch: Alert and oriented X 3, normal affect, Insight and Judgment appropriate.   EKG: NSR, No ST changes  The patient's weight, height, BMI  have been recorded in the chart.  I have made referrals, counseling, and provided education to the patient based on review of the above and I have provided the patient with a written personalized care plan for preventive services.      Izora Ribas, NP   11/05/2021

## 2021-11-05 ENCOUNTER — Other Ambulatory Visit: Payer: Self-pay

## 2021-11-05 ENCOUNTER — Encounter: Payer: Self-pay | Admitting: Adult Health

## 2021-11-05 ENCOUNTER — Ambulatory Visit (INDEPENDENT_AMBULATORY_CARE_PROVIDER_SITE_OTHER): Payer: Medicare Other | Admitting: Adult Health

## 2021-11-05 VITALS — BP 160/80 | HR 81 | Temp 97.7°F | Ht 64.0 in | Wt 152.6 lb

## 2021-11-05 DIAGNOSIS — Z0001 Encounter for general adult medical examination with abnormal findings: Secondary | ICD-10-CM | POA: Diagnosis not present

## 2021-11-05 DIAGNOSIS — E559 Vitamin D deficiency, unspecified: Secondary | ICD-10-CM | POA: Diagnosis not present

## 2021-11-05 DIAGNOSIS — I700 Atherosclerosis of aorta: Secondary | ICD-10-CM | POA: Diagnosis not present

## 2021-11-05 DIAGNOSIS — Z79899 Other long term (current) drug therapy: Secondary | ICD-10-CM | POA: Diagnosis not present

## 2021-11-05 DIAGNOSIS — I10 Essential (primary) hypertension: Secondary | ICD-10-CM

## 2021-11-05 DIAGNOSIS — Z170 Estrogen receptor positive status [ER+]: Secondary | ICD-10-CM

## 2021-11-05 DIAGNOSIS — Z131 Encounter for screening for diabetes mellitus: Secondary | ICD-10-CM

## 2021-11-05 DIAGNOSIS — C50412 Malignant neoplasm of upper-outer quadrant of left female breast: Secondary | ICD-10-CM

## 2021-11-05 DIAGNOSIS — Z136 Encounter for screening for cardiovascular disorders: Secondary | ICD-10-CM | POA: Diagnosis not present

## 2021-11-05 DIAGNOSIS — R7309 Other abnormal glucose: Secondary | ICD-10-CM | POA: Diagnosis not present

## 2021-11-05 DIAGNOSIS — Z1329 Encounter for screening for other suspected endocrine disorder: Secondary | ICD-10-CM

## 2021-11-05 DIAGNOSIS — E782 Mixed hyperlipidemia: Secondary | ICD-10-CM

## 2021-11-05 DIAGNOSIS — M818 Other osteoporosis without current pathological fracture: Secondary | ICD-10-CM

## 2021-11-05 DIAGNOSIS — Z1389 Encounter for screening for other disorder: Secondary | ICD-10-CM | POA: Diagnosis not present

## 2021-11-05 DIAGNOSIS — Z6826 Body mass index (BMI) 26.0-26.9, adult: Secondary | ICD-10-CM

## 2021-11-05 DIAGNOSIS — Z1211 Encounter for screening for malignant neoplasm of colon: Secondary | ICD-10-CM

## 2021-11-05 DIAGNOSIS — Z8249 Family history of ischemic heart disease and other diseases of the circulatory system: Secondary | ICD-10-CM | POA: Diagnosis not present

## 2021-11-05 DIAGNOSIS — I728 Aneurysm of other specified arteries: Secondary | ICD-10-CM

## 2021-11-05 MED ORDER — OLMESARTAN MEDOXOMIL-HCTZ 40-12.5 MG PO TABS: ORAL_TABLET | ORAL | 1 refills | Status: DC

## 2021-11-05 NOTE — Patient Instructions (Addendum)
?  Jacqueline Jenkins , ?Thank you for taking time to come for your Annual Wellness Visit. I appreciate your ongoing commitment to your health goals. Please review the following plan we discussed and let me know if I can assist you in the future.  ? ?These are the goals we discussed: ? Goals   ? ?  LDL CALC < 100   ? ?  ?  ?This is a list of the screening recommended for you and due dates:  ?Health Maintenance  ?Topic Date Due  ? Zoster (Shingles) Vaccine (1 of 2) Never done  ? Pneumonia Vaccine (3 - PPSV23 if available, else PCV20) 06/17/2018  ? Pap Smear  03/01/2021  ? DEXA scan (bone density measurement)  09/07/2021  ? Colon Cancer Screening  01/14/2022  ? Mammogram  09/20/2022  ? Tetanus Vaccine  01/21/2025  ? Flu Shot  Completed  ? COVID-19 Vaccine  Completed  ? Hepatitis C Screening: USPSTF Recommendation to screen - Ages 86-79 yo.  Completed  ? HPV Vaccine  Aged Out  ? ? ? ?Know what a healthy weight is for you (roughly BMI <25) and aim to maintain this ? ?Aim for 7+ servings of fruits and vegetables daily ? ?65-80+ fluid ounces of water or unsweet tea for healthy kidneys ? ?Limit to max 1 drink of alcohol per day; avoid smoking/tobacco ? ?Limit animal fats in diet for cholesterol and heart health - choose grass fed whenever available ? ?Avoid highly processed foods, and foods high in saturated/trans fats ? ?Aim for low stress - take time to unwind and care for your mental health ? ?Aim for 150 min of moderate intensity exercise weekly for heart health, and weights twice weekly for bone health ? ?Aim for 7-9 hours of sleep daily ? ? ? ? ?A great goal to work towards is aiming to get in a serving daily of some of the most nutritionally dense foods - G- BOMBS daily  ? ? ? ? ? ?

## 2021-11-06 LAB — COMPLETE METABOLIC PANEL WITH GFR
AG Ratio: 1.5 (calc) (ref 1.0–2.5)
ALT: 20 U/L (ref 6–29)
AST: 25 U/L (ref 10–35)
Albumin: 4.6 g/dL (ref 3.6–5.1)
Alkaline phosphatase (APISO): 79 U/L (ref 37–153)
BUN: 15 mg/dL (ref 7–25)
CO2: 26 mmol/L (ref 20–32)
Calcium: 10.1 mg/dL (ref 8.6–10.4)
Chloride: 98 mmol/L (ref 98–110)
Creat: 0.75 mg/dL (ref 0.60–1.00)
Globulin: 3 g/dL (calc) (ref 1.9–3.7)
Glucose, Bld: 86 mg/dL (ref 65–99)
Potassium: 4.2 mmol/L (ref 3.5–5.3)
Sodium: 134 mmol/L — ABNORMAL LOW (ref 135–146)
Total Bilirubin: 0.5 mg/dL (ref 0.2–1.2)
Total Protein: 7.6 g/dL (ref 6.1–8.1)
eGFR: 85 mL/min/{1.73_m2} (ref >=60)

## 2021-11-06 LAB — CBC WITH DIFFERENTIAL/PLATELET
Absolute Monocytes: 456 cells/uL (ref 200–950)
Basophils Absolute: 80 cells/uL (ref 0–200)
Basophils Relative: 1.2 %
Eosinophils Absolute: 147 cells/uL (ref 15–500)
Eosinophils Relative: 2.2 %
HCT: 41.2 % (ref 35.0–45.0)
Hemoglobin: 13.6 g/dL (ref 11.7–15.5)
Lymphs Abs: 998 cells/uL (ref 850–3900)
MCH: 30.8 pg (ref 27.0–33.0)
MCHC: 33 g/dL (ref 32.0–36.0)
MCV: 93.2 fL (ref 80.0–100.0)
MPV: 10.4 fL (ref 7.5–12.5)
Monocytes Relative: 6.8 %
Neutro Abs: 5018 cells/uL (ref 1500–7800)
Neutrophils Relative %: 74.9 %
Platelets: 299 10*3/uL (ref 140–400)
RBC: 4.42 10*6/uL (ref 3.80–5.10)
RDW: 11.8 % (ref 11.0–15.0)
Total Lymphocyte: 14.9 %
WBC: 6.7 10*3/uL (ref 3.8–10.8)

## 2021-11-06 LAB — LIPID PANEL
Cholesterol: 188 mg/dL (ref <200)
HDL: 71 mg/dL (ref >=50)
LDL Cholesterol (Calc): 98 mg/dL (calc)
Non-HDL Cholesterol (Calc): 117 mg/dL (calc) (ref <130)
Total CHOL/HDL Ratio: 2.6 (calc) (ref <5.0)
Triglycerides: 101 mg/dL (ref <150)

## 2021-11-06 LAB — HEMOGLOBIN A1C
Hgb A1c MFr Bld: 5.6 % of total Hgb (ref <5.7)
Mean Plasma Glucose: 114 mg/dL
eAG (mmol/L): 6.3 mmol/L

## 2021-11-06 LAB — URINALYSIS, ROUTINE W REFLEX MICROSCOPIC
Bilirubin Urine: NEGATIVE
Glucose, UA: NEGATIVE
Hgb urine dipstick: NEGATIVE
Ketones, ur: NEGATIVE
Leukocytes,Ua: NEGATIVE
Nitrite: NEGATIVE
Protein, ur: NEGATIVE
Specific Gravity, Urine: 1.014 (ref 1.001–1.035)
pH: 7.5 (ref 5.0–8.0)

## 2021-11-06 LAB — MICROALBUMIN / CREATININE URINE RATIO
Creatinine, Urine: 63 mg/dL (ref 20–275)
Microalb Creat Ratio: 14 mcg/mg creat (ref <30)
Microalb, Ur: 0.9 mg/dL

## 2021-11-06 LAB — MAGNESIUM: Magnesium: 2 mg/dL (ref 1.5–2.5)

## 2021-11-06 LAB — VITAMIN D 25 HYDROXY (VIT D DEFICIENCY, FRACTURES): Vit D, 25-Hydroxy: 35 ng/mL (ref 30–100)

## 2021-11-06 LAB — TSH: TSH: 1.35 mIU/L (ref 0.40–4.50)

## 2021-11-11 DIAGNOSIS — D3131 Benign neoplasm of right choroid: Secondary | ICD-10-CM | POA: Diagnosis not present

## 2021-11-11 DIAGNOSIS — D3132 Benign neoplasm of left choroid: Secondary | ICD-10-CM | POA: Diagnosis not present

## 2021-11-11 DIAGNOSIS — H524 Presbyopia: Secondary | ICD-10-CM | POA: Diagnosis not present

## 2021-11-11 DIAGNOSIS — H2513 Age-related nuclear cataract, bilateral: Secondary | ICD-10-CM | POA: Diagnosis not present

## 2021-11-14 DIAGNOSIS — Z1211 Encounter for screening for malignant neoplasm of colon: Secondary | ICD-10-CM | POA: Diagnosis not present

## 2021-11-19 DIAGNOSIS — D2372 Other benign neoplasm of skin of left lower limb, including hip: Secondary | ICD-10-CM | POA: Diagnosis not present

## 2021-11-19 DIAGNOSIS — D225 Melanocytic nevi of trunk: Secondary | ICD-10-CM | POA: Diagnosis not present

## 2021-11-19 DIAGNOSIS — L570 Actinic keratosis: Secondary | ICD-10-CM | POA: Diagnosis not present

## 2021-11-19 DIAGNOSIS — D2271 Melanocytic nevi of right lower limb, including hip: Secondary | ICD-10-CM | POA: Diagnosis not present

## 2021-11-19 DIAGNOSIS — D1801 Hemangioma of skin and subcutaneous tissue: Secondary | ICD-10-CM | POA: Diagnosis not present

## 2021-11-19 DIAGNOSIS — L821 Other seborrheic keratosis: Secondary | ICD-10-CM | POA: Diagnosis not present

## 2021-11-19 DIAGNOSIS — D485 Neoplasm of uncertain behavior of skin: Secondary | ICD-10-CM | POA: Diagnosis not present

## 2021-11-22 LAB — COLOGUARD: COLOGUARD: NEGATIVE

## 2021-12-02 DIAGNOSIS — D485 Neoplasm of uncertain behavior of skin: Secondary | ICD-10-CM | POA: Diagnosis not present

## 2021-12-02 DIAGNOSIS — L98499 Non-pressure chronic ulcer of skin of other sites with unspecified severity: Secondary | ICD-10-CM | POA: Diagnosis not present

## 2021-12-03 ENCOUNTER — Ambulatory Visit: Payer: Medicare Other | Admitting: Nurse Practitioner

## 2022-02-12 ENCOUNTER — Encounter: Payer: Self-pay | Admitting: Nurse Practitioner

## 2022-02-12 ENCOUNTER — Ambulatory Visit (INDEPENDENT_AMBULATORY_CARE_PROVIDER_SITE_OTHER): Payer: Medicare Other | Admitting: Nurse Practitioner

## 2022-02-12 ENCOUNTER — Ambulatory Visit: Payer: Medicare Other | Admitting: Adult Health

## 2022-02-12 VITALS — BP 137/76 | HR 83 | Temp 96.9°F | Resp 16 | Ht 64.0 in | Wt 157.2 lb

## 2022-02-12 DIAGNOSIS — E782 Mixed hyperlipidemia: Secondary | ICD-10-CM

## 2022-02-12 DIAGNOSIS — M818 Other osteoporosis without current pathological fracture: Secondary | ICD-10-CM

## 2022-02-12 DIAGNOSIS — E559 Vitamin D deficiency, unspecified: Secondary | ICD-10-CM | POA: Diagnosis not present

## 2022-02-12 DIAGNOSIS — C50412 Malignant neoplasm of upper-outer quadrant of left female breast: Secondary | ICD-10-CM

## 2022-02-12 DIAGNOSIS — Z170 Estrogen receptor positive status [ER+]: Secondary | ICD-10-CM

## 2022-02-12 DIAGNOSIS — I700 Atherosclerosis of aorta: Secondary | ICD-10-CM

## 2022-02-12 DIAGNOSIS — I728 Aneurysm of other specified arteries: Secondary | ICD-10-CM | POA: Diagnosis not present

## 2022-02-12 DIAGNOSIS — Z0000 Encounter for general adult medical examination without abnormal findings: Secondary | ICD-10-CM

## 2022-02-12 DIAGNOSIS — Z79899 Other long term (current) drug therapy: Secondary | ICD-10-CM

## 2022-02-12 NOTE — Progress Notes (Signed)
Patient ID: Jacqueline Jenkins, female   DOB: 02-10-1951, 71 y.o.   MRN: [id number]   [facility]  Assessment:   1. Encounter for Medicare annual wellness exam Due Annually   2. Aneurysm of splenic artery (HCC) 5 cm splenic artery aneurysm Completed embolization 01/2022 with coil placed. Was follwing up annually by [place], last 05/2019, was planning for 2 year follow up by CTA.  Will reach out to Dr. Lissa Morales [facility] for updated appointment/CTA  3. Aortic atherosclerosis ([place]) by Abd CT scan in 2021 Control blood pressure, Benicar. Control Cholesterol, Repatha Control glucose -A1C WNL  Remain active.  4. Other osteoporosis without current pathological fracture DEXA due.   No longer on Fosamax d/t SE. Follows with Oncology. Patient will have Oncology to order DEXA.    5. Hyperlipidemia, mixed Controlled  Continue medications; Repatha Discussed lifestyle modifications. Recommended diet heavy in fruits and veggies, omega 3's. Decrease consumption of animal meats, cheeses, and dairy products. Remain active and exercise as tolerated. Continue to monitor.   6. Vitamin D deficiency Within normal range, however discussed benefit if higher level. Continue supplement.  7. Malignant neoplasm of upper-outer quadrant of left breast in female, estrogen receptor positive ([place]) She is continuing Anastrozole. Mammogram is UTD Continue screening mammogram 1 year. Oncology following  8. Medication management All medications discussed and reviewed in full. All questions and concerns regarding medications addressed.    Reviewed last labs, no concerns and no changes. Defer labs today after discussion with patient. Will schedule follow up later this year and plan labs at that time.   Future Appointments  Date Time Provider [place]  03/10/2022  1:50 PM GI-315 MR 1 [id number] GI-[facility] [address] [facility]  03/24/2022  1:45 PM Nicholas Lose, MD [facility] None  02/13/2023  11:00 AM Darrol Jump, NP [facility] None    Subjective:   Jacqueline Jenkins is a 71 y.o.  female who presents for acute visit for insect bit and requests AWV be completed while she is here. She has Aneurysm of splenic artery ([place]); Allergic rhinitis; Hyperlipidemia, mixed; Lumbar degenerative disc disease; Vitamin D deficiency; Abnormal glucose; Medication management; Essential hypertension; Breast cancer of upper-outer quadrant of left female breast ([place]); Genetic testing; BRCA2 positive; Dural arteriovenous fistula; Osteoporosis; FHx: heart disease; Aortic atherosclerosis ([place]) by Abd CT scan in 2021; Statin myopathy; and BMI 26.0-26.9,adult on their problem list.  She is primary caregiver for Jacqueline Jenkins with supranuclear palsy/Parkinson's.  Jacqueline Jenkins is fully disabled.  Has two daughters that provide assistance and supportive care.  She has history of L breast lumpectomy (+ER/+PR/HER2 Neg)  in Dec 2016 followed by chemoradiation, underwent BSO due to + BRCA2 mutation, on anastrazole with plan to continue, and follows with Dr. Lindi Adie. DEXA 09/08/2019 showed T score -2.5 by Dr. Lindi Adie, has been on fosamax weekly but has stopped taking d/t SE. She had reassuring mammogram 09/18/2021. She is planning to discuss updated DEXA with Oncology.  She had dural AV fistula repaired by neurosurgery in 2019 without issues and was released unless recurrent issue.   BMI is Body mass index is 26.98 kg/m., she has been working on diet and exercise. Generally making good choices. Walking 10 min daily.  Wt Readings from Last 3 Encounters:  02/12/22 157 lb 3.2 oz (71.3 kg)  11/05/21 152 lb 9.6 oz (69.2 kg)  06/11/21 154 lb (69.9 kg)   She follows with Dr. Trula Slade for large 5 cm splenic artery aneurysm,    She underwent: Embolization  in June 2013, had coil placed as well.  Was follwing up annually by [place], last 05/2019, was planning for 2 year follow up by CTA. Unclear as this has been completed.   Her blood pressure has  been controlled at home (120s-low 140s rarely, 70-80s), today their BP is BP: 137/76,  She does workout, but active caring for Jacqueline Jenkins She denies chest pain, shortness of breath, dizziness. No leg swelling.  She is on cholesterol medication, hx of numerous intolerances, now on repatha via Dr. Debara Pickett, does self injections and doing well. Had cardiac calcium score with score of 8 at 50% with her age, and she had aortic atherosclerosis. Her cholesterol is at goal of LDL <100. The cholesterol last visit was:  Lab Results  Component Value Date   CHOL 188 11/05/2021   HDL 71 11/05/2021   LDLCALC 98 11/05/2021   TRIG 101 11/05/2021   CHOLHDL 2.6 11/05/2021   She has had prediabetes, controlled by lifestyle  She denies foot ulcerations, hyperglycemia, hypoglycemia , increased appetite, nausea, paresthesia of the feet, polydipsia, polyuria, visual disturbances, vomiting and weight loss.  Last A1C in the office was:  Lab Results  Component Value Date   HGBA1C 5.6 11/05/2021    Last GFR:  Lab Results  Component Value Date   GFRNONAA 83 11/05/2020   Patient is on Vitamin D supplement Lab Results  Component Value Date   VD25OH 35 11/05/2021        Medication Review:   Current Outpatient Medications (Cardiovascular):    olmesartan-hydrochlorothiazide (BENICAR HCT) 40-12.5 MG tablet, Take 1 tab daily in the morning for blood pressure.   REPATHA SURECLICK 295 MG/ML SOAJ, INJECT 1 DOSE INTO SKIN EVERY 14 DAYS   Current Outpatient Medications (Analgesics):    naproxen sodium (ANAPROX) 220 MG tablet, Take 220 mg by mouth daily as needed (pain).   Current Outpatient Medications (Other):    anastrozole (ARIMIDEX) 1 MG tablet, TAKE 1 TABLET BY MOUTH EVERY DAY AT 3PM   Calcium Carb-Cholecalciferol (CALCIUM 1000 + D PO), Take 2 tablets by mouth daily. Gummies   VITAMIN D PO, Take 5,000 Units by mouth. Takes 1 capsule every other day.  Current Problems (verified) Patient Active Problem List    Diagnosis Date Noted   BMI 26.0-26.9,adult 11/01/2021   Statin myopathy 08/01/2020   Aortic atherosclerosis ([place]) by Abd CT scan in 2021 07/31/2020   FHx: heart disease 12/28/2017   Osteoporosis 06/17/2017   Dural arteriovenous fistula 03/05/2017   Genetic testing 09/19/2015   BRCA2 positive 09/19/2015   Breast cancer of upper-outer quadrant of left female breast ([place]) 08/21/2015   Medication management 06/07/2015   Essential hypertension 06/07/2015   Abnormal glucose 11/16/2013   Allergic rhinitis    Hyperlipidemia, mixed    Lumbar degenerative disc disease    Vitamin D deficiency    Aneurysm of splenic artery ([place]) 01/19/2012    Allergies Allergies  Allergen Reactions   Lescol [Fluvastatin Sodium] Other (See Comments)    Myalgia   Lipitor [Atorvastatin] Other (See Comments)    Increased LFT's   Vytorin [Ezetimibe-Simvastatin] Other (See Comments)    myalgia     Immunization History  Administered Date(s) Administered   DT (Pediatric) 01/22/2015   Influenza Split 06/08/2014, 06/02/2016   Influenza, High Dose Seasonal PF 06/08/2014, 05/27/2017, 06/01/2018, 06/02/2019, 06/01/2020   Influenza-Unspecified 06/14/2013, 06/04/2015, 05/27/2017   Meningococcal Conjugate 09/02/2011   Moderna Sars-Covid-2 Vaccination 10/17/2019, 11/15/2019   PFIZER(Purple Top)SARS-COV-2 Vaccination 06/27/2020, 01/22/2021   PPD Test 01/18/2014  Pneumococcal Conjugate-13 12/31/2011, 06/17/2017   Pneumococcal Polysaccharide-23 09/02/2011   Pneumococcal-Unspecified 09/22/1995, 09/02/2011   Td 11/27/1994, 04/29/2004, 01/22/2015   Tetanus: 2016 Influenza: 06/2021 Pneumonia: 2013 Prevnar: 2018 Shingrix: discussed, cost, check with insurance  Covid 19: 2/2, moderna/pfizer and booster x 2  Preventative care: Last colonoscopy: Completed Cologuard 2023 Due 2026 DEXA 09/2019- Osteoporosis -2.5 Dr. Lindi Adie is managing  MGM 09/2021    PAP 03/2019 at GYN,  Follows Dr. Marvel [facility]   Names of  Other Physician/Practitioners you currently use: 1. [facility] here for primary care 2. Dr. Valetta Close, eye doctor, last visit 2022. q 6 months, wears glasses  3. Dr. Jason Coop, dentist, last visit 08/2020 4. Dr. Hardie Shackleton did dental implant 5. Dr. Amy Martinique, derm, last 10/2020   SURGICAL HISTORY She  has a past surgical history that includes Tonsillectomy (1968); Spine surgery (1998); Spine surgery (2001, 2010); Cervical disc surgery (1998); Lumbar disc surgery (2001, 2010); splenic aneurysm (02/10/2012); Colonoscopy (Jan. 7, 2014); visceral angiogram (N/A, 02/10/2012); Breast lumpectomy with radioactive seed and sentinel lymph node biopsy (Left, 08/29/2015); Re-excision of breast lumpectomy (Left, 09/07/2015); Portacath placement (Bilateral, 10/09/2015); Portacath removal; Laparoscopic bilateral salpingo oophorectomy (Bilateral, 05/16/2016); IR ANGIO INTRA EXTRACRAN SEL INTERNAL CAROTID BILAT MOD SED (02/03/2017); IR NEURO EACH ADD'L AFTER BASIC UNI RIGHT (MS) (02/03/2017); IR ANGIO VERTEBRAL SEL VERTEBRAL BILAT MOD SED (02/03/2017); IR ANGIO EXTERNAL CAROTID SEL EXT CAROTID BILAT MOD SED (02/03/2017); Radiology with anesthesia (N/A, 03/05/2017); IR NEURO EACH ADD'L AFTER BASIC UNI RIGHT (MS) (03/05/2017); IR Angiogram Follow Up Study (03/05/2017); IR Transcath/Emboliz (03/05/2017); IR ANGIO INTRA EXTRACRAN SEL COM CAROTID INNOMINATE UNI R MOD SED (03/05/2017); IR ANGIO EXTERNAL CAROTID SEL EXT CAROTID UNI R MOD SED (03/05/2017); Breast lumpectomy (Left, 2016); and dural AV fistula (03/2018).   FAMILY HISTORY Her family history includes Breast cancer in her cousin and maternal grandmother; Colon cancer in her maternal uncle; Deep vein thrombosis in her mother; Diverticulitis in her mother; Heart Problems in her paternal grandfather; Heart attack in her father, paternal aunt, paternal grandmother, and paternal uncle; Heart defect in her maternal aunt; Hyperlipidemia in her mother; Hypertension  in her father and mother; Leukemia in her maternal aunt; Lung cancer in her maternal uncle; Osteoporosis in her mother; Other in her father, mother, and sister; Pancreatic cancer in her maternal uncle; Stroke in her father, maternal grandfather, mother, paternal aunt, paternal grandmother, and paternal uncle; Vasculitis in her sister.   SOCIAL HISTORY She  reports that she has never smoked. She has never used smokeless tobacco. She reports that she does not currently use alcohol. She reports that she does not use drugs.   Review of Systems  Constitutional:  Negative for chills, fever, malaise/fatigue and weight loss.  HENT:  Negative for congestion, ear pain, nosebleeds and sore throat.   Eyes: Negative.   Respiratory:  Negative for cough, shortness of breath and wheezing.   Cardiovascular:  Negative for chest pain, palpitations and leg swelling.  Gastrointestinal:  Negative for blood in stool, constipation, diarrhea, heartburn, melena, nausea and vomiting.  Genitourinary:  Negative for dysuria, frequency and urgency.  Musculoskeletal:  Negative for back pain, falls, joint pain, myalgias and neck pain.  Skin:  Rash: left ankle.  Neurological:  Negative for dizziness, sensory change, loss of consciousness and headaches.  Psychiatric/Behavioral:  Negative for depression. The patient is not nervous/anxious and does not have insomnia.      Objective:     BP 137/76   Pulse 83  Temp (!) 96.9 F (36.1 C)   Resp 16   Ht 5' 4"  (1.626 m)   Wt 157 lb 3.2 oz (71.3 kg)   SpO2 99%   BMI 26.98 kg/m   General Appearance: Well nourished, alert, WD/WN, female and in no apparent distress. Eyes: PERRLA, EOMs, conjunctiva no swelling or erythema Sinuses: No frontal/maxillary tenderness ENT/Mouth: EACs patent / TMs  nl. Nares clear without erythema, swelling, mucoid exudates. Oral hygiene is good. No erythema, swelling, or exudate. Tongue normal, non-obstructing. Tonsils not swollen or erythematous.  Hearing normal.  Neck: Supple, thyroid normal. No bruits, nodes or JVD. Respiratory: Respiratory effort normal.  BS equal and clear bilateral without rales, rhonci, wheezing or stridor. Cardio: Heart sounds are normal with regular rate and rhythm and no murmurs, rubs or gallops. Peripheral pulses are normal and equal bilaterally without edema. No aortic or femoral bruits. Abdomen: Flat, soft  with nl bowel sounds. Nontender, no guarding, rebound, hernias, masses, or organomegaly.  Lymphatics: Non tender without lymphadenopathy.  Musculoskeletal: Full ROM all peripheral extremities, no deformity.  Skin: Warm and dry; without concerning lesions, cyanosis, clubbing or  ecchymosis. Multiple seborrheic keratosis across the chest and the abdomen. She has mild erythematous/petichial rash to L inner ankle, approx 4 cm area, improving per comparison to photo from yesterday. No heat, tenderness, distinct lesions.  Neuro: Cranial nerves intact, reflexes equal bilaterally. Normal muscle tone, no cerebellar symptoms. Sensation intact.  Pysch: Alert and oriented X 3, normal affect, Insight and Judgment appropriate.    The patient's weight, height, BMI  have been recorded in the chart.  I have made referrals, counseling, and provided education to the patient based on review of the above and I have provided the patient with a written personalized care plan for preventive services.      Darrol Jump, NP   02/12/2022

## 2022-02-12 NOTE — Patient Instructions (Signed)
Exercise Information for Aging Adults Staying physically active is important as you age. Physical activity and exercise can help in maintaining quality of life, health, physical function, and reducing falls. The four types of exercises that are best for older adults are endurance, strength, balance, and flexibility. Contact your health care provider before you start any exercise routine. Ask your health care provider what activities are safe for you. What are the risks? Risks associated with exercising include: Overdoing it. This may lead to sore muscles or fatigue. Falls. Injuries. Dehydration. How to do these exercises Endurance exercises Endurance (aerobic) exercises raise your breathing rate and heart rate. Increasing your endurance helps you do everyday tasks and stay healthy. By improving the health of your body system that includes your heart, lungs, and blood vessels (circulatory system), you may also delay or prevent diseases such as heart disease, diabetes, and weak bones (osteoporosis). Types of endurance exercises include: Sports. Indoor activities, such as using gym equipment, doing water aerobics, or dancing. Outdoor activities, such as biking or jogging. Tasks around the house, such as gardening, yard work, and heavy household chores like cleaning. Walking, such as hiking or walking around your neighborhood. When doing endurance exercises, make sure you: Are aware of your surroundings. Use safety equipment as directed. Dress in layers when exercising outdoors. Drink plenty of water to stay well hydrated. Build up endurance slowly. Start with 10 minutes at a time, and gradually build up to doing 30 minutes at a time. Unless your health care provider gave you different instructions, aim to exercise for a total of 150 minutes a week. Spread out that time so you are working on endurance 3 or more days a week. Strength exercises Lifting, pulling, or pushing weights helps to  strengthen muscles. Having stronger muscles makes it easier to do everyday activities, such as getting up from a chair, climbing stairs, carrying groceries, and playing with grandchildren. Strength exercises include arm and leg exercises that may be done: With weights. Without weights (using your own body weight). With a resistance band. When doing strength exercises: Move smoothly and steadily. Do not suddenly thrust or jerk the weights, the resistance band, or your body. Start with no weights or with light weights, and gradually add more weight over time. Eventually, aim to use weights that are hard or very hard for you to lift. This means that you are able to do 8 repetitions with the weight, and the last few repetitions are very challenging. Lift or push weights into position for 3 seconds, hold the position for 1 second, and then take 3 seconds to return to your starting position. Breathe out (exhale) during difficult movements, like lifting or pushing weights. Breathe in (inhale) to relax your muscles before the next repetition. Consider alternating arms or legs, especially when you first start strength exercises. Expect some slight muscle soreness after each session. Do strength exercises on 2 or more days a week, for 30 minutes at a time. Avoid exercising the same muscle groups two days in a row. For example, if you work on your leg muscles one day, work on your arm muscles the next day. When you can do two sets of 10-15 repetitions with a certain weight, increase the amount of weight. Balance exercises Balance exercises can help to prevent falls. Balance exercises include: Standing on one foot. Heel-to-toe walk. Balance walk. Tai chi. Make sure you have something sturdy to hold onto while doing balance exercises, such as a sturdy chair. As your balance   improves, challenge yourself by holding on to the chair with one hand instead of two, and then with no hands. Trying exercises with your  eyes closed also challenges your balance, but be sure to have a sturdy surface (like a countertop) close by in case you need it. Do balance exercises as often as you want, or as often as directed by your health care provider. Flexibility exercises  Flexibility exercises improve how far you can bend, straighten, move, or rotate parts of your body (range of motion). These exercises also help you do everyday activities such as getting dressed or reaching for objects. Flexibility exercises include stretching different parts of the body, and they may be done in a standing or seated position or on the floor. When stretching, make sure you: Keep a slight bend in your arms and legs. Avoid completely straightening ("locking") your joints. Do not stretch so far that you feel pain. You should feel a mild stretching feeling. You may try stretching farther as you become more flexible over time. Relax and breathe between stretches. Hold on to something sturdy for balance as needed. Hold each stretch for 10-30 seconds. Repeat each stretch 3-5 times. General safety tips Exercise in well-lit areas. Do not hold your breath during exercises or stretches. Warm up before exercising, and cool down after exercising. This can help prevent injury. Drink plenty of water during exercise or any activity that makes you sweat. If you are not sure if an exercise is safe for you, or you are not sure how to do an exercise, talk with your health care provider. This is especially important if you have had surgery on muscles, bones, or joints (orthopedic surgery). Where to find more information You can find more information about exercise for older adults from: Your local health department, fitness center, or community center. These facilities may have programs for aging adults. National Institute on Aging: www.nia.nih.gov National Council on Aging: www.ncoa.org Summary Staying physically active is important as you age. Doing  endurance, strength, balance, and flexibility exercises can help in maintaining quality of life, health, physical function, and reducing falls. Make sure to contact your health care provider before you start any exercise routine. Ask your health care provider what activities are safe for you. This information is not intended to replace advice given to you by your health care provider. Make sure you discuss any questions you have with your health care provider. Document Revised: 12/31/2020 Document Reviewed: 12/31/2020 Elsevier Patient Education  2023 [employer].  

## 2022-03-10 ENCOUNTER — Ambulatory Visit
Admission: RE | Admit: 2022-03-10 | Discharge: 2022-03-10 | Disposition: A | Discharge status: Home / Self Care | Payer: Medicare Other | Source: Ambulatory Visit | Attending: Hematology and Oncology | Admitting: Hematology and Oncology

## 2022-03-10 DIAGNOSIS — Z170 Estrogen receptor positive status [ER+]: Secondary | ICD-10-CM

## 2022-03-10 DIAGNOSIS — R928 Other abnormal and inconclusive findings on diagnostic imaging of breast: Secondary | ICD-10-CM | POA: Diagnosis not present

## 2022-03-10 DIAGNOSIS — Z803 Family history of malignant neoplasm of breast: Secondary | ICD-10-CM | POA: Diagnosis not present

## 2022-03-10 MED ORDER — GADOBUTROL 1 MMOL/ML IV SOLN
7.0000 mL | Freq: Once | INTRAVENOUS | Status: AC | PRN
  Administered 2022-03-10: 7 mL via INTRAVENOUS

## 2022-03-19 ENCOUNTER — Telehealth: Payer: Self-pay | Admitting: Hematology and Oncology

## 2022-03-19 NOTE — Telephone Encounter (Signed)
Scheduled appointment per room resource. Patient is aware of the changes made to her upcoming appointment.

## 2022-03-20 ENCOUNTER — Other Ambulatory Visit: Payer: Self-pay | Admitting: Hematology and Oncology

## 2022-03-24 ENCOUNTER — Ambulatory Visit: Payer: Medicare Other | Admitting: Hematology and Oncology

## 2022-03-26 NOTE — Progress Notes (Signed)
Patient Care Team: Unk Pinto, MD as PCP - General (Internal Medicine) Serafina Mitchell, MD as Consulting Physician (Vascular Surgery) Carol Ada, MD as Consulting Physician (Gastroenterology) Martinique, Amy, MD as Consulting Physician (Dermatology) Nicholas Lose, MD as Consulting Physician (Hematology and Oncology) Shon Hough, MD as Consulting Physician (Ophthalmology) Paula Compton, MD as Consulting Physician (Obstetrics and Gynecology)  DIAGNOSIS: No diagnosis found.  SUMMARY OF ONCOLOGIC HISTORY: Oncology History  Breast cancer of upper-outer quadrant of left female breast ([place])  08/03/2015 Mammogram   Left Breast: 6 mm mass @ 1 o clock   08/09/2015 Initial Diagnosis   Left breast 1:00 position: Invasive ductal carcinoma grade 1-2, ER 100%, PR 10%, HER-2 negative ratio 1.79, Ki-67 15%, T1 BN 0 stage I a clinical stage   08/29/2015 Surgery   Left lumpectomy ([facility]): invasive ductal carcinoma 1.2 cm with LVID, with DCIS intermediate grade, 1 sentinel node positive, ER 100%, PR 10%, HER-2 negative ratio 1.79, Ki-67 15% , margins negative, Mammaprint high risk, luminal B, T1cN1 stage II a   08/29/2015 Procedure   Mammaprint: High-risk, Luminal type   09/07/2015 Surgery   Left breast re-excision ([facility]): ADH, no malignancy, negative margins.    09/12/2015 Procedure   BRCA2 mutation "[id number]." Also VUS on CHEK2.  Otherwise negative. Genes analyzed:  ATM, BARD1, BRCA1, BRCA2, BRIP1, CDH1, CHEK2, FANCC, MLH1, MSH2, MSH6, NBN, PALB2, PMS2, PTEN, RAD51C, RAD51D, TP53, and XRCC2; deletion/duplication analysis (without sequencing) for EPCAM.   10/11/2015 - 02/21/2016 Chemotherapy   Adjuvant chemotherapy with dose dense Adriamycin and Cytoxan 4 followed by Abraxane weekly 12   03/27/2016 - 04/23/2016 Radiation Therapy   Adjuvant radiation therapy Lisbeth Renshaw). Left breast: 42.5 Gy in 17 fractions. Left breast boost: 7.5 Gy in 3 fractions.    05/16/2016 Surgery    Bilateral salpingo-oophorectomies: No cancer   06/05/2016 -  Anti-estrogen oral therapy   Anastrozole 1 mg by mouth daily     CHIEF COMPLIANT:   INTERVAL HISTORY: Jacqueline Jenkins is a   ALLERGIES:  is allergic to lescol [fluvastatin sodium], lipitor [atorvastatin], and vytorin [ezetimibe-simvastatin].  MEDICATIONS:  Current Outpatient Medications  Medication Sig Dispense Refill   anastrozole (ARIMIDEX) 1 MG tablet TAKE 1 TABLET BY MOUTH EVERY DAY AT 3PM 90 tablet 0   Calcium Carb-Cholecalciferol (CALCIUM 1000 + D PO) Take 2 tablets by mouth daily. Gummies     naproxen sodium (ANAPROX) 220 MG tablet Take 220 mg by mouth daily as needed (pain).     olmesartan-hydrochlorothiazide (BENICAR HCT) 40-12.5 MG tablet Take 1 tab daily in the morning for blood pressure. 90 tablet 1   REPATHA SURECLICK 415 MG/ML SOAJ INJECT 1 DOSE INTO SKIN EVERY 14 DAYS 6 mL 3   VITAMIN D PO Take 5,000 Units by mouth. Takes 1 capsule every other day.     No current facility-administered medications for this visit.    PHYSICAL EXAMINATION: ECOG PERFORMANCE STATUS: {CHL ONC ECOG PS:[id number]}  There were no vitals filed for this visit. There were no vitals filed for this visit.  BREAST:*** No palpable masses or nodules in either right or left breasts. No palpable axillary supraclavicular or infraclavicular adenopathy no breast tenderness or nipple discharge. (exam performed in the presence of a chaperone)  LABORATORY DATA:  I have reviewed the data as listed    Latest Ref Rng & Units 11/05/2021    2:47 PM 06/11/2021    2:45 PM 11/05/2020    3:11 PM  CMP  Glucose 65 - 99 mg/dL 86  83  89   BUN 7 - 25 mg/dL 15  9  16    Creatinine 0.60 - 1.00 mg/dL 0.75  0.63  0.73   Sodium 135 - 146 mmol/L 134  132  133   Potassium 3.5 - 5.3 mmol/L 4.2  4.2  4.2   Chloride 98 - 110 mmol/L 98  96  97   CO2 20 - 32 mmol/L 26  26  28    Calcium 8.6 - 10.4 mg/dL 10.1  9.7  9.5   Total Protein 6.1 - 8.1 g/dL 7.6  7.1  7.1    Total Bilirubin 0.2 - 1.2 mg/dL 0.5  0.5  0.5   AST 10 - 35 U/L 25  25  21    ALT 6 - 29 U/L 20  21  17      Lab Results  Component Value Date   WBC 6.7 11/05/2021   HGB 13.6 11/05/2021   HCT 41.2 11/05/2021   MCV 93.2 11/05/2021   PLT 299 11/05/2021   NEUTROABS 5,018 11/05/2021    ASSESSMENT & PLAN:  No problem-specific Assessment & Plan notes found for this encounter.    No orders of the defined types were placed in this encounter.  The patient has a good understanding of the overall plan. she agrees with it. she will call with any problems that may develop before the next visit here. Total time spent: 30 mins including face to face time and time spent for planning, charting and co-ordination of care   Suzzette Righter, [place] 03/26/22    I Gardiner Coins am scribing for Dr. Lindi Adie  ***

## 2022-03-27 ENCOUNTER — Inpatient Hospital Stay: Payer: Medicare Other | Attending: Hematology and Oncology | Admitting: Hematology and Oncology

## 2022-03-27 ENCOUNTER — Other Ambulatory Visit: Payer: Self-pay

## 2022-03-27 VITALS — BP 131/87 | HR 85 | Temp 97.7°F | Resp 18 | Ht 64.0 in | Wt 156.9 lb

## 2022-03-27 DIAGNOSIS — C50412 Malignant neoplasm of upper-outer quadrant of left female breast: Secondary | ICD-10-CM | POA: Diagnosis not present

## 2022-03-27 DIAGNOSIS — Z79899 Other long term (current) drug therapy: Secondary | ICD-10-CM | POA: Insufficient documentation

## 2022-03-27 DIAGNOSIS — Z79811 Long term (current) use of aromatase inhibitors: Secondary | ICD-10-CM | POA: Insufficient documentation

## 2022-03-27 DIAGNOSIS — Z923 Personal history of irradiation: Secondary | ICD-10-CM | POA: Diagnosis not present

## 2022-03-27 DIAGNOSIS — Z780 Asymptomatic menopausal state: Secondary | ICD-10-CM | POA: Diagnosis not present

## 2022-03-27 DIAGNOSIS — Z9221 Personal history of antineoplastic chemotherapy: Secondary | ICD-10-CM | POA: Diagnosis not present

## 2022-03-27 DIAGNOSIS — Z170 Estrogen receptor positive status [ER+]: Secondary | ICD-10-CM | POA: Diagnosis not present

## 2022-03-27 MED ORDER — ANASTROZOLE 1 MG PO TABS: 1.0000 mg | ORAL_TABLET | Freq: Every day | ORAL | 3 refills | Status: DC

## 2022-03-27 NOTE — Assessment & Plan Note (Addendum)
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 Patient does not need axillary lymph node dissection based on current NCCN guidelines and ACOSOG Z 11 clinical trial; luminal type B and high-risk phenotype. Risk of relapse without chemotherapy 24% versus 12% with chemotherapy.  Treatment summary: 1. Dose dense Adriamycin and Cytoxan 4 followed by Abraxane weekly 12 started 10/11/2015 completed 02/21/2016 2. Adjuvant radiation therapy started 03/27/2016 completed 04/23/2016 3. Adjuvant antiestrogen therapy started 05/22/2016 ----------------------------------------------------------------------------------------------------------------- Current treatment: Antiestrogen therapy with anastrozole 1 mg dailyto start 06/01/2016 BRCA2 mutation: Oophorectomy 05/16/2016: No malignancy  Anastrozole Toxicities: Able to tolerate anastrozole fairly well. Denies any hot flashes or myalgias.  She does not want to stop anastrozole even after 7 years of therapy.  Surveillance of breast cancer: 1. Mammograms1/20/2023: Benign, breast density category B 2.MRI breast7/07/2022. (Because of BRCA2 mutation): No evidence of recurrent breast cancer, 3.  Breast exam 03/27/2022: Benign  Osteoporosis:Bone density January 2021: T score -2.5:  We will obtain another bone density and then decide if she needs to go back on Fosamax.   Return to clinic 1 year for follow-up

## 2022-04-04 ENCOUNTER — Other Ambulatory Visit: Payer: Self-pay | Admitting: Hematology and Oncology

## 2022-04-04 DIAGNOSIS — Z1231 Encounter for screening mammogram for malignant neoplasm of breast: Secondary | ICD-10-CM

## 2022-04-28 ENCOUNTER — Other Ambulatory Visit: Payer: Self-pay

## 2022-04-28 DIAGNOSIS — I10 Essential (primary) hypertension: Secondary | ICD-10-CM

## 2022-04-28 MED ORDER — OLMESARTAN MEDOXOMIL-HCTZ 40-12.5 MG PO TABS: ORAL_TABLET | ORAL | 1 refills | Status: DC

## 2022-04-29 ENCOUNTER — Other Ambulatory Visit: Payer: Self-pay | Admitting: Nurse Practitioner

## 2022-04-29 DIAGNOSIS — I10 Essential (primary) hypertension: Secondary | ICD-10-CM

## 2022-04-29 NOTE — Telephone Encounter (Signed)
Med needs a prior [place]

## 2022-05-21 DIAGNOSIS — Z01419 Encounter for gynecological examination (general) (routine) without abnormal findings: Secondary | ICD-10-CM | POA: Diagnosis not present

## 2022-05-27 ENCOUNTER — Ambulatory Visit (INDEPENDENT_AMBULATORY_CARE_PROVIDER_SITE_OTHER): Payer: Medicare Other | Admitting: Internal Medicine

## 2022-05-27 ENCOUNTER — Encounter: Payer: Self-pay | Admitting: Internal Medicine

## 2022-05-27 VITALS — BP 132/78 | HR 75 | Temp 97.7°F | Resp 16 | Ht 64.0 in | Wt 155.4 lb

## 2022-05-27 DIAGNOSIS — Z79899 Other long term (current) drug therapy: Secondary | ICD-10-CM | POA: Diagnosis not present

## 2022-05-27 DIAGNOSIS — R7309 Other abnormal glucose: Secondary | ICD-10-CM | POA: Diagnosis not present

## 2022-05-27 DIAGNOSIS — I10 Essential (primary) hypertension: Secondary | ICD-10-CM | POA: Diagnosis not present

## 2022-05-27 DIAGNOSIS — E559 Vitamin D deficiency, unspecified: Secondary | ICD-10-CM | POA: Diagnosis not present

## 2022-05-27 DIAGNOSIS — I700 Atherosclerosis of aorta: Secondary | ICD-10-CM | POA: Diagnosis not present

## 2022-05-27 DIAGNOSIS — E782 Mixed hyperlipidemia: Secondary | ICD-10-CM | POA: Diagnosis not present

## 2022-05-27 NOTE — Patient Instructions (Signed)

## 2022-05-27 NOTE — Progress Notes (Signed)
Future Appointments  Date Time Provider Department  05/27/2022  3:30 PM Unk Pinto, MD [facility]  02/13/2023 11:00 AM Darrol Jump, NP [facility]  03/30/2023  2:00 PM Nicholas Lose, MD [facility]     History of Present Illness:       This very nice 71 y.o. MWF presents for 3 month follow up with HTN, HLD, Pre-Diabetes and Vitamin D Deficiency. Patient is followed by Dr Lindi Adie on Arimidex for hx/o Lt Breast Lumpectomy (2016) tx'd Chemo-radiation. Abd CT scan in 2021 revealed Aortic atherosclerosis.       Patient is treated for HTN (1996) & BP has been controlled and today's BP is                        . Patient has had no complaints of any cardiac type chest pain, palpitations, dyspnea Vertell Limber /PND, dizziness, claudication or dependent edema.       Patient has hx/o Statin myopathy and is on PCSK9i /Repatha q2weeks. Hyperlipidemia is controlled with diet & meds. Patient denies myalgias or other med SE's.  Last Lipids were at goal :  Lab Results  Component Value Date   CHOL 188 11/05/2021   HDL 71 11/05/2021   LDLCALC 98 11/05/2021   TRIG 101 11/05/2021   CHOLHDL 2.6 11/05/2021     Also, the patient has history of PreDiabetes (A1c 6.2% /2016 and 5.7% /2016) and has had no symptoms of reactive hypoglycemia, diabetic polys, paresthesias or visual blurring.   Last A1c was normal & at goal :  Lab Results  Component Value Date   HGBA1C 5.6 11/05/2021                                                           Further, the patient also has history of Vitamin D Deficiency ("29" /2016) and supplements vitamin D without any suspected side-effects. Last vitamin D was low :  Lab Results  Component Value Date   VD25OH 35 11/05/2021      Current Outpatient Medications  Medication Instructions   anastrozole (ARIMIDEX)  1 mg  Daily   Calcium Carb-Vit D (CALCIUM 1000 + D ) 2 tablets /Gummies Daily,    naproxen  220 mg Daily PRN   olmesartan-hctz 40-12.5 MG tablet TAKE  1 TAB DAILY    REPATHA 140 MG INJECT INTO SKIN EVERY 14 DAYS   VITAMIN D 5,000 Units Takes 1 capsule every other day.       Allergies  Allergen Reactions   Lescol [Fluvastatin Sodium] Other (See Comments)    Myalgia   Lipitor [Atorvastatin] Other (See Comments)    Increased LFT's   Vytorin [Ezetimibe-Simvastatin] Other (See Comments)    myalgia     PMHx:   Past Medical History:  Diagnosis Date   Allergic rhinitis, cause unspecified    Anemia    history of anemia while teenager   BRCA2 positive    Breast cancer ([place]) 2016   Left Breast Cancer   Breast cancer of upper-outer quadrant of left female breast ([place])    chemo complete 01/2016, radiation 04/2016   DDD (degenerative disc disease)    Diverticulitis    Family history of breast cancer 08/30/2015   Dx. In maternal grandmother in her 34s-40;  dx. In maternal first  cousin in her 12s-60s    Family history of colon cancer 08/30/2015   Dx. In maternal uncle in his 47s    Hyperlipidemia    Hypertension    Left kidney mass 04/06/2018   Mild growth from Ct 2013 to 04/06/2018;  Renal US 2020 no growth- will not follow up   Neuropathy    Osteopenia 12/2011   Spine T -0.4, Femur -2.1   Personal history of chemotherapy 2016   Left Breast Cancer   Personal history of radiation therapy 2016   Left Breast Cancer   PONV (postoperative nausea and vomiting)    Splenic artery aneurysm ([place]) 2013   COILS DONE   Vitamin D deficiency      Immunization History  Administered Date(s) Administered   DT  01/22/2015   Influenza Split 06/08/2014, 06/02/2016   Influenza, High Dose  06/01/2018, 06/02/2019, 06/01/2020   Influenza 06/14/2013, 06/04/2015, 05/27/2017   Meningococcal  09/02/2011   Moderna Sars-Covid-2 Vacc 10/17/2019, 11/15/2019   PFIZER-SARS-COV-2 Vacc 06/27/2020, 01/22/2021   PPD Test 01/18/2014   Pneumococcal - 13 12/31/2011, 06/17/2017   Pneumococcal - 23 09/02/2011   Pneumococcal - 23 09/22/1995, 09/02/2011   Td  11/27/1994, 04/29/2004, 01/22/2015     Past Surgical History:  Procedure Laterality Date   BREAST LUMPECTOMY Left 2016   BREAST LUMPECTOMY WITH RADIOACTIVE SEED AND SENTINEL LYMPH NODE BIOPSY Left 08/29/2015   Procedure: LEFT BREAST LUMPECTOMY WITH RADIOACTIVE SEED WITH AXILLARY SENTINEL LYMPH NODE BIOPSY;  Surgeon: Excell Seltzer, MD;  Location: [facility];  Service: General;  Laterality: Left;   [place]   COLONOSCOPY  Jan. 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   Dural AV fistula repair   IR ANGIO EXTERNAL CAROTID SEL EXT CAROTID BILAT MOD SED  02/03/2017   IR ANGIO EXTERNAL CAROTID SEL EXT CAROTID UNI R MOD SED  03/05/2017   IR ANGIO INTRA EXTRACRAN SEL COM CAROTID INNOMINATE UNI R MOD SED  03/05/2017   IR ANGIO INTRA EXTRACRAN SEL INTERNAL CAROTID BILAT MOD SED  02/03/2017   IR ANGIO VERTEBRAL SEL VERTEBRAL BILAT MOD SED  02/03/2017   IR ANGIOGRAM FOLLOW UP STUDY  03/05/2017   IR NEURO EACH ADD'L AFTER BASIC UNI RIGHT (MS)  02/03/2017   IR NEURO EACH ADD'L AFTER BASIC UNI RIGHT (MS)  03/05/2017   IR TRANSCATH/EMBOLIZ  03/05/2017   LAPAROSCOPIC BILATERAL SALPINGO OOPHERECTOMY Bilateral 05/16/2016   Procedure: LAPAROSCOPIC BILATERAL SALPINGO OOPHORECTOMY;  Surgeon: Paula Compton, MD;  Location: [facility];  Service: Gynecology;  Laterality: Bilateral;   LUMBAR DISC SURGERY  2001, 2010   PORTACATH PLACEMENT Bilateral 10/09/2015   Procedure: INSERTION PORT-A-CATH;  Surgeon: Excell Seltzer, MD;  Location: [facility];  Service: General;  Laterality: Bilateral;   Portacath removal     RADIOLOGY WITH ANESTHESIA N/A 03/05/2017   Procedure: ARTERIOGRAM ONYX EMBOLIZATION OF FISTULA;  Surgeon: Consuella Lose, MD;  Location: [facility];  Service: Radiology;  Laterality: N/A;   RE-EXCISION OF BREAST LUMPECTOMY Left 09/07/2015   Procedure: RE-EXCISION OF LEFT BREAST LUMPECTOMY;  Surgeon: Excell Seltzer, MD;  Location: [facility];  Service: General;  Laterality: Left;    [place]   cervical diskectomy   SPINE SURGERY  2001, 2010   Lumbar diskectomy   splenic aneurysm  02/10/2012   Aneurysm of splenic artery, coil placed, Dr. Arita Miss   TONSILLECTOMY  1968   VISCERAL ANGIOGRAM N/A 02/10/2012   Procedure: VISCERAL ANGIOGRAM;  Surgeon: Butch Penny  Trula Slade, MD;  [facility];  Service: Cardiovascular;  Laterality: N/A;    FHx:    Reviewed / unchanged  SHx:    Reviewed / unchanged   Systems Review:  Constitutional: Denies fever, chills, wt changes, headaches, insomnia, fatigue, night sweats, change in appetite. Eyes: Denies redness, blurred vision, diplopia, discharge, itchy, watery eyes.  ENT: Denies discharge, congestion, post nasal drip, epistaxis, sore throat, earache, hearing loss, dental pain, tinnitus, vertigo, sinus pain, snoring.  CV: Denies chest pain, palpitations, irregular heartbeat, syncope, dyspnea, diaphoresis, orthopnea, PND, claudication or edema. Respiratory: denies cough, dyspnea, DOE, pleurisy, hoarseness, laryngitis, wheezing.  Gastrointestinal: Denies dysphagia, odynophagia, heartburn, reflux, water brash, abdominal pain or cramps, nausea, vomiting, bloating, diarrhea, constipation, hematemesis, melena, hematochezia  or hemorrhoids. Genitourinary: Denies dysuria, frequency, urgency, nocturia, hesitancy, discharge, hematuria or flank pain. Musculoskeletal: Denies arthralgias, myalgias, stiffness, jt. swelling, pain, limping or strain/sprain.  Skin: Denies pruritus, rash, hives, warts, acne, eczema or change in skin lesion(s). Neuro: No weakness, tremor, incoordination, spasms, paresthesia or pain. Psychiatric: Denies confusion, memory loss or sensory loss. Endo: Denies change in weight, skin or hair change.  Heme/Lymph: No excessive bleeding, bruising or enlarged lymph nodes.  Physical Exam  BP (!) 150/78   Pulse 75   Temp 97.7 F (36.5 C)   Resp 16   Ht _0  (1.626 m)   Wt 155 lb 6.4 oz (70.5 kg)   SpO2 96%    BMI 26.67 kg/m   Appears  well nourished, well groomed  and in no distress.  Eyes: PERRLA, EOMs, conjunctiva no swelling or erythema. Sinuses: No frontal/maxillary tenderness ENT/Mouth: EAC's clear, TM's nl w/o erythema, bulging. Nares clear w/o erythema, swelling, exudates. Oropharynx clear without erythema or exudates. Oral hygiene is good. Tongue normal, non obstructing. Hearing intact.  Neck: Supple. Thyroid not palpable. Car 2+/2+ without bruits, nodes or JVD. Chest: Respirations nl with BS clear & equal w/o rales, rhonchi, wheezing or stridor.  Cor: Heart sounds normal w/ regular rate and rhythm without sig. murmurs, gallops, clicks or rubs. Peripheral pulses normal and equal  without edema.  Abdomen: Soft & bowel sounds normal. Non-tender w/o guarding, rebound, hernias, masses or organomegaly.  Lymphatics: Unremarkable.  Musculoskeletal: Full ROM all peripheral extremities, joint stability, 5/5 strength and normal gait.  Skin: Warm, dry without exposed rashes, lesions or ecchymosis apparent.  Neuro: Cranial nerves intact, reflexes equal bilaterally. Sensory-motor testing grossly intact. Tendon reflexes grossly intact.  Pysch: Alert & oriented x 3.  Insight and judgement nl & appropriate. No ideations.  Assessment and Plan:  1. Essential hypertension  - Continue medication, monitor blood pressure at home.  - Continue DASH diet.  Reminder to go to the ER if any CP,  SOB, nausea, dizziness, severe HA, changes vision/speech.    - CBC with Differential/Platelet - COMPLETE METABOLIC PANEL WITH GFR - Magnesium - TSH - olmesartan-hydrochlorothiazide 40-12.5 MG tablet;  Take  1 tablet  every Morning for BP   Dispense: 90 tablet; Refill: 3  2 . Hyperlipidemia, mixed  - Continue diet/meds, exercise,& lifestyle modifications.  - Continue monitor periodic cholesterol/liver & renal functions   - TSH  3. Abnormal glucose  - Continue diet, exercise  - Lifestyle modifications.  -  Monitor appropriate labs   - Fructosamine - Insulin, random  4. Vitamin D deficiency  - Continue supplementation.    5. Aortic atherosclerosis ([place]) by Abd CT scan in 2021   6. Medication management - CBC with Differential/Platelet - COMPLETE METABOLIC PANEL WITH  GFR - Magnesium - TSH - Fructosamine - Insulin, random        Discussed  regular exercise, BP monitoring, weight control to achieve/maintain BMI less than 25 and discussed med and SE's. Recommended labs to assess and monitor clinical status with further disposition pending results of labs.  I discussed the assessment and treatment plan with the patient. The patient was provided an opportunity to ask questions and all were answered. The patient agreed with the plan and demonstrated an understanding of the instructions.  I provided over 30 minutes of exam, counseling, chart review and  complex critical decision making.        The patient was advised to call back or seek an in-person evaluation if the symptoms worsen or if the condition fails to improve as anticipated.   Kirtland Bouchard, MD

## 2022-05-28 LAB — COMPLETE METABOLIC PANEL WITH GFR
AG Ratio: 1.7 (calc) (ref 1.0–2.5)
ALT: 22 U/L (ref 6–29)
AST: 28 U/L (ref 10–35)
Albumin: 4.6 g/dL (ref 3.6–5.1)
Alkaline phosphatase (APISO): 77 U/L (ref 37–153)
BUN: 15 mg/dL (ref 7–25)
CO2: 26 mmol/L (ref 20–32)
Calcium: 9.6 mg/dL (ref 8.6–10.4)
Chloride: 97 mmol/L — ABNORMAL LOW (ref 98–110)
Creat: 0.69 mg/dL (ref 0.60–1.00)
Globulin: 2.7 g/dL (calc) (ref 1.9–3.7)
Glucose, Bld: 83 mg/dL (ref 65–99)
Potassium: 4.3 mmol/L (ref 3.5–5.3)
Sodium: 131 mmol/L — ABNORMAL LOW (ref 135–146)
Total Bilirubin: 0.4 mg/dL (ref 0.2–1.2)
Total Protein: 7.3 g/dL (ref 6.1–8.1)
eGFR: 92 mL/min/{1.73_m2} (ref >=60)

## 2022-05-28 LAB — LIPID PANEL
Cholesterol: 171 mg/dL (ref <200)
HDL: 66 mg/dL (ref >=50)
LDL Cholesterol (Calc): 79 mg/dL (calc)
Non-HDL Cholesterol (Calc): 105 mg/dL (calc) (ref <130)
Total CHOL/HDL Ratio: 2.6 (calc) (ref <5.0)
Triglycerides: 161 mg/dL — ABNORMAL HIGH (ref <150)

## 2022-05-28 LAB — CBC WITH DIFFERENTIAL/PLATELET
Absolute Monocytes: 410 cells/uL (ref 200–950)
Basophils Absolute: 98 cells/uL (ref 0–200)
Basophils Relative: 1.5 %
Eosinophils Absolute: 189 cells/uL (ref 15–500)
Eosinophils Relative: 2.9 %
HCT: 38.6 % (ref 35.0–45.0)
Hemoglobin: 12.9 g/dL (ref 11.7–15.5)
Lymphs Abs: 930 cells/uL (ref 850–3900)
MCH: 31.9 pg (ref 27.0–33.0)
MCHC: 33.4 g/dL (ref 32.0–36.0)
MCV: 95.3 fL (ref 80.0–100.0)
MPV: 10 fL (ref 7.5–12.5)
Monocytes Relative: 6.3 %
Neutro Abs: 4875 cells/uL (ref 1500–7800)
Neutrophils Relative %: 75 %
Platelets: 295 10*3/uL (ref 140–400)
RBC: 4.05 10*6/uL (ref 3.80–5.10)
RDW: 12.2 % (ref 11.0–15.0)
Total Lymphocyte: 14.3 %
WBC: 6.5 10*3/uL (ref 3.8–10.8)

## 2022-05-28 LAB — INSULIN, RANDOM: Insulin: 15.6 u[IU]/mL

## 2022-05-28 LAB — HEMOGLOBIN A1C
Hgb A1c MFr Bld: 5.6 % of total Hgb (ref <5.7)
Mean Plasma Glucose: 114 mg/dL
eAG (mmol/L): 6.3 mmol/L

## 2022-05-28 LAB — VITAMIN D 25 HYDROXY (VIT D DEFICIENCY, FRACTURES): Vit D, 25-Hydroxy: 50 ng/mL (ref 30–100)

## 2022-05-28 LAB — MAGNESIUM: Magnesium: 1.8 mg/dL (ref 1.5–2.5)

## 2022-05-28 LAB — TSH: TSH: 1.47 mIU/L (ref 0.40–4.50)

## 2022-05-28 NOTE — Progress Notes (Signed)
<><><><><><><><><><><><><><><><><><><><><><><><><><><><><><><><><> <><><><><><><><><><><><><><><><><><><><><><><><><><><><><><><><><> -   Test results slightly outside the reference range are not unusual. If there is anything important, I will review this with you,  otherwise it is considered normal test values.  If you have further questions,  please do not hesitate to contact me at the office or via My Chart.  <><><><><><><><><><><><><><><><><><><><><><><><><><><><><><><><><> <><><><><><><><><><><><><><><><><><><><><><><><><><><><><><><><><>  -  Total Chol = 171     &   LDL Chol = 79    - Both  Excellent   - Very low risk for Heart Attack  / Stroke <><><><><><><><><><><><><><><><><><><><><><><><><><><><><><><><><>  -   Magnesium  -   1.8    -  very  low- goal is betw 2.0 - 2.5,   - So..............Marland Kitchen  Recommend that you take                                         Magnesium 500 mg tablet x 2 tablets  Daily    - also important to eat lots of  leafy green vegetables   - spinach - Kale - collards - greens - okra - asparagus  - broccoli - quinoa - squash - almonds   - black, red, white beans  -  peas - green beans <><><><><><><><><><><><><><><><><><><><><><><><><><><><><><><><><>  -  Thyroid - Normal  <><><><><><><><><><><><><><><><><><><><><><><><><><><><><><><><><>  -  A1c - Normal - No Diabetes  - Great !  <><><><><><><><><><><><><><><><><><><><><><><><><><><><><><><><><>  -  Vitamin DS = 50 -    OK  <><><><><><><><><><><><><><><><><><><><><><><><><><><><><><><><><>  -  All Else - CBC - Kidneys - Electrolytes - Liver - Magnesium & Thyroid    - all  Normal / OK <><><><><><><><><><><><><><><><><><><><><><><><><><><><><><><><><>

## 2022-05-31 DIAGNOSIS — Z23 Encounter for immunization: Secondary | ICD-10-CM | POA: Diagnosis not present

## 2022-07-04 DIAGNOSIS — Z23 Encounter for immunization: Secondary | ICD-10-CM | POA: Diagnosis not present

## 2022-07-08 ENCOUNTER — Ambulatory Visit (INDEPENDENT_AMBULATORY_CARE_PROVIDER_SITE_OTHER): Payer: Medicare Other | Admitting: Internal Medicine

## 2022-07-08 ENCOUNTER — Encounter: Payer: Self-pay | Admitting: Internal Medicine

## 2022-07-08 VITALS — BP 140/80 | HR 75 | Temp 98.0°F | Resp 17 | Ht 64.0 in | Wt 156.0 lb

## 2022-07-08 DIAGNOSIS — M48061 Spinal stenosis, lumbar region without neurogenic claudication: Secondary | ICD-10-CM

## 2022-07-08 DIAGNOSIS — M5431 Sciatica, right side: Secondary | ICD-10-CM | POA: Diagnosis not present

## 2022-07-08 MED ORDER — DEXAMETHASONE 4 MG PO TABS: ORAL_TABLET | ORAL | 0 refills | Status: DC

## 2022-07-08 MED ORDER — GABAPENTIN 100 MG PO CAPS: ORAL_CAPSULE | ORAL | 0 refills | Status: DC

## 2022-07-08 NOTE — Progress Notes (Signed)
Future Appointments  Date Time Provider Department  07/08/2022  2:30 PM Unk Pinto, MD [facility]  09/23/2022 11:30 AM Darrol Jump, NP [facility]  02/13/2023                    wellness 11:00 AM Darrol Jump, NP [facility]  03/30/2023  2:00 PM Nicholas Lose, MD [facility]    History of Present Illness:       This very nice 71 y.o. MWF  with HTN, HLD, hx/o Lt Breast Ca, Pre-Diabetes and Vitamin D Deficiency who presents with c/o Rt Sciatica x months , worse x 2-3 weeks. No Bladder problems. Worse with ambulation. Pain bilat lower lumbar area at level of  SI jts and radiates around to Rt buttock - to Rt posterior thigh to Rt lateral calf to ankle.       Patient had Cx Disc fusion in 1998 & Lumbar HNP surgeries x 2  (2001 & 2010) - all by Dr Ellene Route.    Medications    olmesartan-hctz  40-12.5 MG tablet, TAKE 1 TAB DAILY    REPATHA SURECLICK 381 MG/ML , INJECT 1 DOSE INTO SKIN EVERY 14 DAYS   naproxen sodium  220 MG tablet, Take daily as needed (pain).   anastrozole (ARIMIDEX) 1 MG tablet, Take 1 tablet  daily.   CALCIUM 1000 + D , Take 2 tablets  daily   Multiple Vitamin, Take 1 tablet  daily.   VITAMIN D 5,000 Units ;  Takes 1 capsule every other day.  Problem list She has Aneurysm of splenic artery ([place]); Allergic rhinitis; Hyperlipidemia, mixed; Lumbar degenerative disc disease; Vitamin D deficiency; Abnormal glucose; Medication management; Essential hypertension; Breast cancer of upper-outer quadrant of left female breast ([place]); Genetic testing; BRCA2 positive; Dural arteriovenous fistula; Osteoporosis; FHx: heart disease; Aortic atherosclerosis ([place]) by Abd CT scan in 2021;  Statin myopathy; and BMI 26.0-26.9,adult on their problem list.   Observations/Objective:  BP (!) 140/80   Pulse 75   Temp 98 F (36.7 C)   Resp 17   Ht _0  (1.626 m)   Wt 156 lb (70.8 kg)   SpO2 99%   BMI 26.78 kg/m   HEENT - WNL. Neck - supple.  Chest - Clear equal BS. Cor - Nl  HS. RRR w/o sig MGR. PP 1(+). No edema. MS- FROM w/o deformities.  Bilat Knee crepitus.           Sl (+) SLR on Rt. -   Gait sl limp favoring the Rt.  Neuro -  Nl w/o focal abnormalities.   Assessment and Plan:  1. Right sciatic nerve pain  - dexamethasone 4 MG tablet;  Take 1 tab 3 x day - 3 days, then 2 x day - 3 days, then 1 tab daily   Dispense: 20 tablet  - gabapentin 100 MG capsule;  Take  1 to 3 capsules   3 x / day  for Sciatica Pain   Dispense: 270 capsule;  - MR Lumbar Spine Wo Contrast; Future - Ambulatory referral to Neurosurgery  2. (Suspect) Spinal stenosis of lumbar region , (MRI pending)   - MR Lumbar Spine Wo Contrast; Future - Ambulatory referral to Neurosurgery    Follow Up Instructions:        I discussed the assessment and treatment plan with the patient. The patient was provided an opportunity to ask questions and all were answered. The patient agreed with the plan and demonstrated an understanding of the instructions.  The patient was advised to call back or seek an in-person evaluation if the symptoms worsen or if the condition fails to improve as anticipated.   Kirtland Bouchard, MD

## 2022-07-08 NOTE — Patient Instructions (Addendum)
Spinal Stenosis  Spinal stenosis is a condition that happens when the spinal canal narrows. The spinal canal is the space between the bones of your spine (vertebrae). This narrowing puts pressure on the spinal cord and nerves that exit the spine and run down the arms or legs. When nerves exiting the spine are pinched, it can cause pain, numbness, or weakness in the arms or legs. Spinal stenosis can affect the vertebrae in the neck, upper back, and lower back. Spinal stenosis can range from mild to severe. What are the causes?  This condition is caused by areas of bone pushing into the spinal canal. This condition may be present at birth (congenital), or it may be caused by:  Slow breakdown of your vertebrae (spinal degeneration). This usually starts between 44 and 39 years of age. Injury (trauma) to your spine. Previous spinal surgery. Tumors in your spine. Calcium deposits in your spine. What increases the risk? The following factors may make you more likely to develop this condition: Being older than age 68. Being born with an abnormally shaped spine (congenitalspinal deformity), such as scoliosis. Having arthritis. What are the signs or symptoms?  Symptoms of this condition include:  Pain in the neck or back that is generally worse with activities, particularly when standing or walking. Numbness, tingling, hot or cold sensations, weakness, or tiredness (fatigue) in your arms or legs. This can happen in one arm or leg, or both. Pain going from the buttock, down the thigh, and to the calf (sciatica). This can happen in one or both legs. Falling frequently. Foot drop. This is when you have trouble lifting the front part of your foot and it drags on the ground when you walk. This can lead to muscle weakness.  In more severe cases, you may develop:  Problems having a bowel movement or urinating. Difficulty having sex. Loss of feeling in your legs and inability to walk.  Symptoms may  come on slowly and get worse over time. In some cases, there are no symptoms. How is this diagnosed? This condition is diagnosed based on your medical history and a physical exam. You may also have tests, such as an X-ray, CT scan, or MRI. How is this treated? Treatment for this condition often focuses on managing your pain and any other symptoms. Treatment may include: Practicing good posture to lessen pressure on your nerves. Exercises to strengthen muscles, build endurance, improve balance, and maintain range of motion. This may include physical therapy to restore movement and strength to your back. Losing weight, if needed. Medicines to reduce inflammation or pain. This may include a medicine that is injected into your spine (steroidinjection). Assistive devices, such as a corset or brace. In some cases, surgery may be needed. The most common procedure is decompression laminectomy. This removes excess bone that puts pressure on your nerve roots. Follow these instructions at home: Managing pain, stiffness, and swelling  Practice good posture. If you were given a brace or a corset, wear it as told by your health care provider. Maintain a healthy weight. Talk with your health care provider if you need help losing weight. If directed, apply heat to the affected area as often as told by your health care provider. Use the heat source that your health care provider recommends, such as a moist heat pack or a heating pad. Place a towel between your skin and the heat source. Leave the heat on for 20-30 minutes. If your skin turns bright red, remove the heat  right away to prevent burns. The risk of burns is higher if you cannot feel pain, heat, or cold. Activity Do all exercises and stretches as told by your health care provider. Do not do any activities that cause pain. You may have to avoid lifting. Ask your health care provider how much you can safely lift. Return to your normal activities as  told by your health care provider. Ask your health care provider what activities are safe for you. General instructions Take over-the-counter and prescription medicines only as told by your health care provider. Do not use any products that contain nicotine or tobacco. These products include cigarettes, chewing tobacco, and vaping devices, such as e-cigarettes. If you need help quitting, ask your health care provider. Eat a healthy diet. This includes plenty of fruits and vegetables, whole grains, and low-fat (lean) protein. Where to find more information [employer] of Arthritis and Musculoskeletal and Skin Diseases: www.niams.[employer].es Contact a health care provider if: Your symptoms do not get better or they get worse. You have a fever. Get help right away if: You have new pain or symptoms of severe pain, such as: New or worsening pain in your neck or upper back. Severe pain that cannot be controlled with medicines. A severe headache that gets worse when you stand. You are dizzy. You have vision problems, such as blurred vision or double vision. You have nausea or vomiting. You develop new or worsening numbness or tingling in your back or legs. You lose control of your bowels or bladder. You have pain, redness, swelling, or warmth in your arm or leg. These symptoms may be an emergency. Get help right away. Call 911. Do not wait to see if the symptoms will go away. Do not drive yourself to the hospital. Summary Spinal stenosis is a condition that happens when the spinal canal narrows, putting pressure on the spinal cord or nerves that exit the vertebrae. This condition is caused by areas of bone pushing into the spinal canal. Spinal stenosis can cause numbness, weakness, or pain in the buttocks, neck, back, arms, and legs. This condition is usually diagnosed with your medical history, a physical exam, and tests, such as an X-ray, CT scan, or MRI.   Sciatica is pain, numbness,  weakness, or tingling along the path of the sciatic nerve. The sciatic nerve starts in the lower back and runs down the back of each leg. The nerve controls the muscles in the lower leg and in the back of the knee. It also provides feeling (sensation) to the back of the thigh, the lower leg, and the sole of the foot. Sciatica is a symptom of another medical condition that pinches or puts pressure on the sciatic nerve. Sciatica most often only affects one side of the body. Sciatica usually goes away on its own or with treatment. In some cases, sciatica may come back (recur). What are the causes? This condition is caused by pressure on the sciatic nerve or pinching of the nerve. This may be the result of: A disk in between the bones of the spine bulging out too far (herniated disk). Age-related changes in the spinal disks. A pain disorder that affects a muscle in the buttock. Extra bone growth near the sciatic nerve. A break (fracture) of the pelvis. Pregnancy. Tumor. This is rare. What increases the risk? The following factors may make you more likely to develop this condition: Playing sports that place pressure or stress on the spine. Having poor strength and flexibility.  A history of back injury or surgery. Sitting for long periods of time. Doing activities that involve repetitive bending or lifting. Obesity. What are the signs or symptoms? Symptoms can vary from mild to very severe. They may include: Any of the following problems in the lower back, leg, hip, or buttock: Mild tingling, numbness, or dull aches. Burning sensations. Sharp pains. Numbness in the back of the calf or the sole of the foot. Leg weakness. Severe back pain that makes movement difficult. Symptoms may get worse when you cough, sneeze, or laugh, or when you sit or stand for long periods of time. How is this diagnosed? This condition may be diagnosed based on: Your symptoms and medical history. A physical  exam. Blood tests. Imaging tests, such as: X-rays. An MRI. A CT scan. How is this treated? In many cases, this condition improves on its own without treatment. However, treatment may include: Reducing or modifying physical activity. Exercising, including strengthening and stretching. Icing and applying heat to the affected area. Medicines that help to: Relieve pain and swelling. Relax your muscles. Injections of medicines that help to relieve pain and inflammation (steroids) around the sciatic nerve. Surgery. Follow these instructions at home: Medicines Take over-the-counter and prescription medicines only as told by your health care provider. Ask your health care provider if the medicine prescribed to you requires you to avoid driving or using heavy machinery. Managing pain If directed, put ice on the affected area. To do this: Put ice in a plastic bag. Place a towel between your skin and the bag. Leave the ice on for 20 minutes, 2-3 times a day. If your skin turns bright red, remove the ice right away to prevent skin damage. The risk of skin damage is higher if you cannot feel pain, heat, or cold. If directed, apply heat to the affected area as often as told by your health care provider. Use the heat source that your health care provider recommends, such as a moist heat pack or a heating pad. Place a towel between your skin and the heat source. Leave the heat on for 20-30 minutes. If your skin turns bright red, remove the heat right away to prevent burns. The risk of burns is higher if you cannot feel pain, heat, or cold. Activity  Return to your normal activities as told by your health care provider. Ask your health care provider what activities are safe for you. Avoid activities that make your symptoms worse. Take brief periods of rest throughout the day. When you rest for longer periods, mix in some mild activity or stretching between periods of rest. This will help to prevent  stiffness and pain. Avoid sitting for long periods of time without moving. Get up and move around at least one time each hour. Exercise and stretch regularly as told by your health care provider. Do not lift anything that is heavier than 10 lb (4.5 kg) until your health care provider says that it is safe. When you do not have symptoms, you should still avoid heavy lifting, especially repetitive heavy lifting. When you lift objects, always use proper lifting technique, which includes: Bending your knees. Keeping the load close to your body. Avoiding twisting. General instructions Maintain a healthy weight. Excess weight puts extra stress on your back. Wear supportive, comfortable shoes. Avoid wearing high heels. Avoid sleeping on a mattress that is too soft or too hard. A mattress that is firm enough to support your back when you sleep may help to reduce  your pain. Contact a health care provider if: Your pain is not controlled by medicine. Your pain does not improve or gets worse. Your pain lasts longer than 4 weeks. You have unexplained weight loss. Get help right away if: You are not able to control when you urinate or have bowel movements (incontinence). You have: Weakness in your lower back, pelvis, buttocks, or legs that gets worse. Redness or swelling of your back. A burning sensation when you urinate. Summary Sciatica is pain, numbness, weakness, or tingling along the path of the sciatic nerve, which may include the lower back, legs, hips, and buttocks. This condition is caused by pressure on the sciatic nerve or pinching of the nerve. Treatment often includes rest, exercise, medicines, and applying ice or heat. This information is not intended to replace advice given to you by your health care provider. Make sure you discuss any questions you have with your health care provider. Document Revised: 11/25/2021 Document Reviewed: 11/25/2021 [employer] Patient Education  [place].

## 2022-07-11 ENCOUNTER — Ambulatory Visit
Admission: RE | Admit: 2022-07-11 | Discharge: 2022-07-11 | Disposition: A | Discharge status: Home / Self Care | Payer: Medicare Other | Source: Ambulatory Visit | Attending: Internal Medicine | Admitting: Internal Medicine

## 2022-07-11 DIAGNOSIS — M48061 Spinal stenosis, lumbar region without neurogenic claudication: Secondary | ICD-10-CM

## 2022-07-11 DIAGNOSIS — M5136 Other intervertebral disc degeneration, lumbar region: Secondary | ICD-10-CM | POA: Diagnosis not present

## 2022-07-11 DIAGNOSIS — M4316 Spondylolisthesis, lumbar region: Secondary | ICD-10-CM | POA: Diagnosis not present

## 2022-07-11 DIAGNOSIS — M5431 Sciatica, right side: Secondary | ICD-10-CM

## 2022-07-11 DIAGNOSIS — M47816 Spondylosis without myelopathy or radiculopathy, lumbar region: Secondary | ICD-10-CM | POA: Diagnosis not present

## 2022-07-11 DIAGNOSIS — M5416 Radiculopathy, lumbar region: Secondary | ICD-10-CM | POA: Diagnosis not present

## 2022-07-11 NOTE — Progress Notes (Signed)
<><><><><><><><><><><><><><><><><><><><><><><><><><><><><><><><><> <><><><><><><><><><><><><><><><><><><><><><><><><><><><><><><><><>  -   MRI does show  a ruptured disk at L3-4  & L4-5  ( Forwarding  a copy to Dr Ellene Route ) <><><><><><><><><><><><><><><><><><><><><><><><><><><><><><><><><> <><><><><><><><><><><><><><><><><><><><><><><><><><><><><><><><><>

## 2022-07-16 DIAGNOSIS — Z6825 Body mass index (BMI) 25.0-25.9, adult: Secondary | ICD-10-CM | POA: Diagnosis not present

## 2022-07-16 DIAGNOSIS — M5416 Radiculopathy, lumbar region: Secondary | ICD-10-CM | POA: Diagnosis not present

## 2022-07-30 ENCOUNTER — Other Ambulatory Visit: Payer: Self-pay | Admitting: Nurse Practitioner

## 2022-07-30 ENCOUNTER — Telehealth: Payer: Self-pay | Admitting: Nurse Practitioner

## 2022-07-30 ENCOUNTER — Other Ambulatory Visit: Payer: Self-pay

## 2022-07-30 DIAGNOSIS — I10 Essential (primary) hypertension: Secondary | ICD-10-CM

## 2022-07-30 MED ORDER — OLMESARTAN MEDOXOMIL-HCTZ 40-12.5 MG PO TABS: ORAL_TABLET | ORAL | 5 refills | Status: DC

## 2022-07-30 NOTE — Telephone Encounter (Signed)
Patient is requesting a refill on Olmesartan 40-12.5 mg to [facility] in [place]

## 2022-07-30 NOTE — Telephone Encounter (Signed)
Has she changes insurances?  The pharmacy is asking for an alternative for this medication

## 2022-09-02 DIAGNOSIS — M5126 Other intervertebral disc displacement, lumbar region: Secondary | ICD-10-CM | POA: Diagnosis not present

## 2022-09-02 DIAGNOSIS — M5116 Intervertebral disc disorders with radiculopathy, lumbar region: Secondary | ICD-10-CM | POA: Diagnosis not present

## 2022-09-04 ENCOUNTER — Ambulatory Visit: Payer: Medicare Other | Admitting: Nurse Practitioner

## 2022-09-19 DIAGNOSIS — M25551 Pain in right hip: Secondary | ICD-10-CM | POA: Diagnosis not present

## 2022-09-19 DIAGNOSIS — Z6826 Body mass index (BMI) 26.0-26.9, adult: Secondary | ICD-10-CM | POA: Diagnosis not present

## 2022-09-19 DIAGNOSIS — M5416 Radiculopathy, lumbar region: Secondary | ICD-10-CM | POA: Diagnosis not present

## 2022-09-23 ENCOUNTER — Ambulatory Visit
Admission: RE | Admit: 2022-09-23 | Discharge: 2022-09-23 | Disposition: A | Discharge status: Home / Self Care | Payer: Medicare Other | Source: Ambulatory Visit | Attending: Hematology and Oncology | Admitting: Hematology and Oncology

## 2022-09-23 ENCOUNTER — Ambulatory Visit: Payer: Medicare Other | Admitting: Nurse Practitioner

## 2022-09-23 DIAGNOSIS — C50412 Malignant neoplasm of upper-outer quadrant of left female breast: Secondary | ICD-10-CM

## 2022-09-23 DIAGNOSIS — Z1231 Encounter for screening mammogram for malignant neoplasm of breast: Secondary | ICD-10-CM

## 2022-09-23 DIAGNOSIS — Z780 Asymptomatic menopausal state: Secondary | ICD-10-CM

## 2022-10-01 ENCOUNTER — Ambulatory Visit: Payer: Medicare Other | Attending: Neurological Surgery

## 2022-10-01 ENCOUNTER — Other Ambulatory Visit: Payer: Self-pay

## 2022-10-01 DIAGNOSIS — R252 Cramp and spasm: Secondary | ICD-10-CM | POA: Diagnosis not present

## 2022-10-01 DIAGNOSIS — M6281 Muscle weakness (generalized): Secondary | ICD-10-CM | POA: Insufficient documentation

## 2022-10-01 DIAGNOSIS — R293 Abnormal posture: Secondary | ICD-10-CM | POA: Diagnosis not present

## 2022-10-01 DIAGNOSIS — M25551 Pain in right hip: Secondary | ICD-10-CM | POA: Diagnosis not present

## 2022-10-01 DIAGNOSIS — R262 Difficulty in walking, not elsewhere classified: Secondary | ICD-10-CM | POA: Diagnosis not present

## 2022-10-01 DIAGNOSIS — M5416 Radiculopathy, lumbar region: Secondary | ICD-10-CM | POA: Diagnosis not present

## 2022-10-01 NOTE — Therapy (Signed)
OUTPATIENT PHYSICAL THERAPY THORACOLUMBAR EVALUATION   Patient Name: Jacqueline Jenkins MRN: [id number] DOB:March 04, 1951, 72 y.o., female Today's Date: 10/01/2022  END OF SESSION:  PT End of Session - 10/01/22 1113     Visit Number 1    Date for PT Re-Evaluation 11/26/22    Authorization Type MEDICARE PART A AND B    PT Start Time 1100    PT Stop Time 1154    PT Time Calculation (min) 54 min    Activity Tolerance Patient limited by pain    Behavior During Therapy [facility] for tasks assessed/performed             Past Medical History:  Diagnosis Date   Allergic rhinitis, cause unspecified    Anemia    history of anemia while teenager   BRCA2 positive    Breast cancer ([place]) 2016   Left Breast Cancer   Breast cancer of upper-outer quadrant of left female breast ([place])    chemo complete 01/2016, radiation 04/2016   DDD (degenerative disc disease)    Diverticulitis    Family history of breast cancer 08/30/2015   Dx. In maternal grandmother in her 6s-40; dx. In maternal first cousin in her 23s-60s    Family history of colon cancer 08/30/2015   Dx. In maternal uncle in his 68s    Hyperlipidemia    Hypertension    Left kidney mass 04/06/2018   Mild growth from Ct 2013 to 04/06/2018; Renal US 2020 no growth- will not follow up   Neuropathy    Osteopenia 12/2011   Spine T -0.4, Femur -2.1   Personal history of chemotherapy 2016   Left Breast Cancer   Personal history of radiation therapy 2016   Left Breast Cancer   PONV (postoperative nausea and vomiting)    Splenic artery aneurysm ([place]) 2013   COILS DONE   Vitamin D deficiency    Past Surgical History:  Procedure Laterality Date   BREAST LUMPECTOMY Left 2016   BREAST LUMPECTOMY WITH RADIOACTIVE SEED AND SENTINEL LYMPH NODE BIOPSY Left 08/29/2015   Procedure: LEFT BREAST LUMPECTOMY WITH RADIOACTIVE SEED WITH AXILLARY SENTINEL LYMPH NODE BIOPSY;  Surgeon: Excell Seltzer, MD;  Location: [place];  Service: General;   Laterality: Left;   [place]   COLONOSCOPY  Jan. 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   Dural AV fistula repair   IR ANGIO EXTERNAL CAROTID SEL EXT CAROTID BILAT MOD SED  02/03/2017   IR ANGIO EXTERNAL CAROTID SEL EXT CAROTID UNI R MOD SED  03/05/2017   IR ANGIO INTRA EXTRACRAN SEL COM CAROTID INNOMINATE UNI R MOD SED  03/05/2017   IR ANGIO INTRA EXTRACRAN SEL INTERNAL CAROTID BILAT MOD SED  02/03/2017   IR ANGIO VERTEBRAL SEL VERTEBRAL BILAT MOD SED  02/03/2017   IR ANGIOGRAM FOLLOW UP STUDY  03/05/2017   IR NEURO EACH ADD'L AFTER BASIC UNI RIGHT (MS)  02/03/2017   IR NEURO EACH ADD'L AFTER BASIC UNI RIGHT (MS)  03/05/2017   IR TRANSCATH/EMBOLIZ  03/05/2017   LAPAROSCOPIC BILATERAL SALPINGO OOPHERECTOMY Bilateral 05/16/2016   Procedure: LAPAROSCOPIC BILATERAL SALPINGO OOPHORECTOMY;  Surgeon: Paula Compton, MD;  Location: [facility];  Service: Gynecology;  Laterality: Bilateral;   LUMBAR DISC SURGERY  2001, 2010   PORTACATH PLACEMENT Bilateral 10/09/2015   Procedure: INSERTION PORT-A-CATH;  Surgeon: Excell Seltzer, MD;  Location: [facility];  Service: General;  Laterality: Bilateral;   Portacath removal     RADIOLOGY WITH ANESTHESIA  N/A 03/05/2017   Procedure: ARTERIOGRAM ONYX EMBOLIZATION OF FISTULA;  Surgeon: Consuella Lose, MD;  Location: [facility];  Service: Radiology;  Laterality: N/A;   RE-EXCISION OF BREAST LUMPECTOMY Left 09/07/2015   Procedure: RE-EXCISION OF LEFT BREAST LUMPECTOMY;  Surgeon: Excell Seltzer, MD;  Location: [facility];  Service: General;  Laterality: Left;   [place]   cervical diskectomy   SPINE SURGERY  2001, 2010   Lumbar diskectomy   splenic aneurysm  02/10/2012   Aneurysm of splenic artery, coil placed, Dr. 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   Genetic testing 09/19/2015   BRCA2 positive 09/19/2015   Breast cancer of upper-outer quadrant of left female breast ([place]) 08/21/2015   Medication management 06/07/2015   Essential hypertension 06/07/2015   Abnormal glucose 11/16/2013   Allergic rhinitis    Hyperlipidemia, mixed    Lumbar degenerative disc disease    Vitamin D deficiency    Aneurysm of splenic artery ([place]) 01/19/2012    PCP:    Unk Pinto, MD    REFERRING PROVIDER: Kristeen Miss, MD  REFERRING DIAG: M54.16 (ICD-10-CM) - Radiculopathy, lumbar region  Rationale for Evaluation and Treatment: Rehabilitation  THERAPY DIAG:  Radiculopathy, lumbar region  Pain in right hip  Cramp and spasm  Difficulty in walking, not elsewhere classified  Muscle weakness (generalized)  Abnormal posture  ONSET DATE: 09/30/2022  SUBJECTIVE:                                                                                                                                                                                           SUBJECTIVE STATEMENT: Patient reports long history of back issues.  Her spouse has been disabled for approx 13 years and she has cared for him and had to help him up from the floor quite frequently in the past 5 years.  She has undergone 3 lumbar surgeries including laminectomy on 09-02-22 and one neck surgery in the past 20 years.  She enjoys having some free time to herself and just getting out of the house. Her children help with caring for her husband to give her a break and they have hired help due to  her back issues.    PERTINENT HISTORY:  Lumbar laminectomy 09-02-22 (3rd lumbar surgery)  PAIN:  Are you having pain? Yes: NPRS scale: 0/10 Pain  location: most pain is post rest and start up pain; right hip Pain description: sore muscle thigh, and some pain in groin, describes a disconnected kind of feeling Aggravating factors: prolonged sitting Relieving factors: heat  PRECAUTIONS: Back  WEIGHT BEARING RESTRICTIONS: No  FALLS:  Has patient fallen in last 6 months? No  LIVING ENVIRONMENT: Lives with: lives with their spouse Lives in: House/apartment  OCCUPATION: Retired  PLOF: Independent, Independent with basic ADLs, Independent with household mobility without device, Independent with community mobility without device, Independent with homemaking with ambulation, Independent with gait, and Independent with transfers  PATIENT GOALS: to eliminate hip pain so that she can properly recover from her back surgery  NEXT MD VISIT:   OBJECTIVE:   DIAGNOSTIC FINDINGS: 07/11/22 IMPRESSION: 1. Lumbar spondylosis and degenerative disc disease, causing prominent impingement at L3-4; moderate impingement at L4-5 and L5-S1; and 3 mm degenerative anterolisthesis at L4-5 and L5-S1. 2. The dominant finding on today's exam is the right lateral recess and foraminal disc protrusion at L3-4 contributing substantially to the prominent right central and subarticular lateral recess stenosis at this level.  PATIENT SURVEYS:  FOTO 41 (predicted 68)  SCREENING FOR RED FLAGS: Bowel or bladder incontinence: No Spinal tumors: No Cauda equina syndrome: No Compression fracture: No Abdominal aneurysm: No  COGNITION: Overall cognitive status: Within functional limits for tasks assessed     SENSATION: Mild L3-L4 radicular symptoms linger in the right thigh  MUSCLE LENGTH: Hamstrings: Right 50 deg; Left 60 deg Thomas test: Right pos ; Left pos   POSTURE: decreased lumbar lordosis and posterior pelvic tilt  LUMBAR ROM:  Deferred due to 4 weeks post op: complete this at 8 weeks AROM eval  Flexion   Extension   Right lateral flexion    Left lateral flexion   Right rotation   Left rotation    (Blank rows = not tested)  LOWER EXTREMITY ROM:     WFL  LOWER EXTREMITY MMT:    Generally 4/5 right LE and 4+/5 left LE LUMBAR SPECIAL TESTS:  See diagnostic findings  FUNCTIONAL TESTS:  5 times sit to stand: 20.10 sec Timed up and go (TUG): 12.01 sec  GAIT: Distance walked: 30 Assistive device utilized: None Level of assistance: Modified independence Comments: antalgic  TODAY'S TREATMENT:                                                                                                                              DATE: 10/01/22 Initial eval completed and initiated HEP    PATIENT EDUCATION:  Education details: Initiated HEP and educated on anatomy of the hip including labrum and possibility of labral involvement in the hip Person educated: Patient Education method: Consulting civil engineer, Demonstration, Verbal cues, and Handouts Education comprehension: verbalized understanding, returned demonstration, and verbal cues required  HOME EXERCISE PROGRAM: Access Code: [email or web address] URL: [email or web address] Date: 10/01/2022 Prepared by: Candyce Churn  Exercises - Supine Hamstring Stretch with Strap  - 1 x daily - 7 x weekly - 1 sets - 3 reps - 30 hold - Supine ITB Stretch with Strap  - 1 x daily - 7 x weekly - 1 sets - 3 reps - 30 hold - Supine Figure 4 Piriformis Stretch  - 1 x daily - 7 x weekly - 1 sets - 3 reps - 30 hold  ASSESSMENT:  CLINICAL IMPRESSION: Patient is a 72 y.o. female who was seen today for physical therapy evaluation and treatment for 4 weeks post lumbar laminectomy.  She appears to be functioning at about 50% of her normal level and minimal pain with exception of start up pain.  She admits that most of her pain is in the right hip.  She refers to the groin area and to the lateral hip.  She presents with tightness throughout the right hip along with weakness in in all 4 planes of motions as well  as rotation weakness vs the left.  She ambulates with trendelenburg gait.  She has minimal lumbar lordosis and antalgic gait.  She would benefit from bilateral LE stretching R>L along with post laminectomy protocol.    OBJECTIVE IMPAIRMENTS: decreased mobility, difficulty walking, decreased ROM, decreased strength, increased fascial restrictions, increased muscle spasms, impaired flexibility, impaired sensation, improper body mechanics, and pain.   ACTIVITY LIMITATIONS: carrying, lifting, bending, sitting, standing, squatting, sleeping, stairs, transfers, bed mobility, bathing, dressing, and hygiene/grooming  PARTICIPATION LIMITATIONS: meal prep, cleaning, laundry, interpersonal relationship, driving, shopping, community activity, yard work, and church  PERSONAL FACTORS: Fitness and 1-2 comorbidities: HTN, Hx breast cx  are also affecting patient's functional outcome.   REHAB POTENTIAL: Fair Patient has multiple orthopedic spine issues over a 20 year time period.  She must care for her disabled spouse but does have help now.   CLINICAL DECISION MAKING: Evolving/moderate complexity  EVALUATION COMPLEXITY: Moderate   GOALS: Goals reviewed with patient? Yes  SHORT TERM GOALS: Target date: 10/29/2022    Pain report to be no greater than 4/10  Baseline: Goal status: INITIAL  2.  Patient will be independent with initial HEP  Baseline:  Goal status: INITIAL   LONG TERM GOALS: Target date: 11/26/2022    Patient to report pain no greater than 2/10  Baseline:  Goal status: INITIAL  2.  Patient to be independent with advanced HEP  Baseline:  Goal status: INITIAL  3.  Radicular symptoms to be centralized Baseline:  Goal status: INITIAL  4.  Patient to be able to sleep through the night  Baseline:  Goal status: INITIAL  5.  Patient to report 85% improvement in overall symptoms  Baseline:  Goal status: INITIAL  6.  FOTO to improve to 61  and functional tests to improve by 2-3  seconds Baseline: FOTO 41 on eval  5 times sit to stand: 20.10 sec Timed up and go (TUG): 12.01 sec Goal status: INITIAL  PLAN:  PT FREQUENCY: 2x/week  PT DURATION: 8 weeks  PLANNED INTERVENTIONS: Therapeutic exercises, Therapeutic activity, Neuromuscular re-education, Balance training, Gait training, Patient/Family education, Self Care, Joint mobilization, Stair training, DME instructions, Aquatic Therapy, Dry Needling, Electrical stimulation, Spinal mobilization, Cryotherapy, Moist heat, Taping, Traction, Ultrasound, Ionotophoresis '4mg'$ /ml Dexamethasone, Manual therapy, and Re-evaluation.  PLAN FOR NEXT SESSION: Nustep, review HEP, add further LE stretching and Right hip strengthening   Shavona Gunderman B. Makaya Juneau, PT 10/01/22 12:13 PM  [facility] [address] [place], [place] [address] Phone # [phone] Fax [phone]

## 2022-10-04 ENCOUNTER — Other Ambulatory Visit: Payer: Self-pay | Admitting: Internal Medicine

## 2022-10-08 ENCOUNTER — Ambulatory Visit: Payer: Medicare Other | Attending: Neurological Surgery

## 2022-10-08 DIAGNOSIS — R262 Difficulty in walking, not elsewhere classified: Secondary | ICD-10-CM | POA: Diagnosis not present

## 2022-10-08 DIAGNOSIS — M6281 Muscle weakness (generalized): Secondary | ICD-10-CM | POA: Insufficient documentation

## 2022-10-08 DIAGNOSIS — M25551 Pain in right hip: Secondary | ICD-10-CM | POA: Insufficient documentation

## 2022-10-08 DIAGNOSIS — M5416 Radiculopathy, lumbar region: Secondary | ICD-10-CM | POA: Insufficient documentation

## 2022-10-08 DIAGNOSIS — R293 Abnormal posture: Secondary | ICD-10-CM | POA: Diagnosis not present

## 2022-10-08 DIAGNOSIS — R252 Cramp and spasm: Secondary | ICD-10-CM | POA: Insufficient documentation

## 2022-10-08 NOTE — Therapy (Signed)
OUTPATIENT PHYSICAL THERAPY THORACOLUMBAR TREATMENT NOTE   Patient Name: Jacqueline Jenkins MRN: [id number] DOB:11/12/1950, 72 y.o., female Today's Date: 10/08/2022  END OF SESSION:  PT End of Session - 10/08/22 1448     Visit Number 2    Date for PT Re-Evaluation 11/26/22    Authorization Type MEDICARE PART A AND B    PT Start Time 1448    PT Stop Time 1530    PT Time Calculation (min) 42 min    Activity Tolerance Patient limited by pain    Behavior During Therapy [facility] for tasks assessed/performed             Past Medical History:  Diagnosis Date   Allergic rhinitis, cause unspecified    Anemia    history of anemia while teenager   BRCA2 positive    Breast cancer ([place]) 2016   Left Breast Cancer   Breast cancer of upper-outer quadrant of left female breast ([place])    chemo complete 01/2016, radiation 04/2016   DDD (degenerative disc disease)    Diverticulitis    Family history of breast cancer 08/30/2015   Dx. In maternal grandmother in her 49s-40; dx. In maternal first cousin in her 63s-60s    Family history of colon cancer 08/30/2015   Dx. In maternal uncle in his 36s    Hyperlipidemia    Hypertension    Left kidney mass 04/06/2018   Mild growth from Ct 2013 to 04/06/2018; Renal US 2020 no growth- will not follow up   Neuropathy    Osteopenia 12/2011   Spine T -0.4, Femur -2.1   Personal history of chemotherapy 2016   Left Breast Cancer   Personal history of radiation therapy 2016   Left Breast Cancer   PONV (postoperative nausea and vomiting)    Splenic artery aneurysm ([place]) 2013   COILS DONE   Vitamin D deficiency    Past Surgical History:  Procedure Laterality Date   BREAST LUMPECTOMY Left 2016   BREAST LUMPECTOMY WITH RADIOACTIVE SEED AND SENTINEL LYMPH NODE BIOPSY Left 08/29/2015   Procedure: LEFT BREAST LUMPECTOMY WITH RADIOACTIVE SEED WITH AXILLARY SENTINEL LYMPH NODE BIOPSY;  Surgeon: Excell Seltzer, MD;  Location: [facility];  Service:  General;  Laterality: Left;   [place]   COLONOSCOPY  Jan. 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   Dural AV fistula repair   IR ANGIO EXTERNAL CAROTID SEL EXT CAROTID BILAT MOD SED  02/03/2017   IR ANGIO EXTERNAL CAROTID SEL EXT CAROTID UNI R MOD SED  03/05/2017   IR ANGIO INTRA EXTRACRAN SEL COM CAROTID INNOMINATE UNI R MOD SED  03/05/2017   IR ANGIO INTRA EXTRACRAN SEL INTERNAL CAROTID BILAT MOD SED  02/03/2017   IR ANGIO VERTEBRAL SEL VERTEBRAL BILAT MOD SED  02/03/2017   IR ANGIOGRAM FOLLOW UP STUDY  03/05/2017   IR NEURO EACH ADD'L AFTER BASIC UNI RIGHT (MS)  02/03/2017   IR NEURO EACH ADD'L AFTER BASIC UNI RIGHT (MS)  03/05/2017   IR TRANSCATH/EMBOLIZ  03/05/2017   LAPAROSCOPIC BILATERAL SALPINGO OOPHERECTOMY Bilateral 05/16/2016   Procedure: LAPAROSCOPIC BILATERAL SALPINGO OOPHORECTOMY;  Surgeon: Paula Compton, MD;  Location: [facility];  Service: Gynecology;  Laterality: Bilateral;   LUMBAR DISC SURGERY  2001, 2010   PORTACATH PLACEMENT Bilateral 10/09/2015   Procedure: INSERTION PORT-A-CATH;  Surgeon: Excell Seltzer, MD;  Location: [facility];  Service: General;  Laterality: Bilateral;   Portacath removal     RADIOLOGY WITH  ANESTHESIA N/A 03/05/2017   Procedure: ARTERIOGRAM ONYX EMBOLIZATION OF FISTULA;  Surgeon: Consuella Lose, MD;  Location: [facility];  Service: Radiology;  Laterality: N/A;   RE-EXCISION OF BREAST LUMPECTOMY Left 09/07/2015   Procedure: RE-EXCISION OF LEFT BREAST LUMPECTOMY;  Surgeon: Excell Seltzer, MD;  Location: [facility];  Service: General;  Laterality: Left;   [place]   cervical diskectomy   SPINE SURGERY  2001, 2010   Lumbar diskectomy   splenic aneurysm  02/10/2012   Aneurysm of splenic artery, coil placed, Dr. 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   Genetic testing 09/19/2015   BRCA2 positive 09/19/2015   Breast cancer of upper-outer quadrant of left female breast ([place]) 08/21/2015   Medication management 06/07/2015   Essential hypertension 06/07/2015   Abnormal glucose 11/16/2013   Allergic rhinitis    Hyperlipidemia, mixed    Lumbar degenerative disc disease    Vitamin D deficiency    Aneurysm of splenic artery ([place]) 01/19/2012    PCP:    Unk Pinto, MD    REFERRING PROVIDER: Kristeen Miss, MD  REFERRING DIAG: M54.16 (ICD-10-CM) - Radiculopathy, lumbar region  Rationale for Evaluation and Treatment: Rehabilitation  THERAPY DIAG:  Radiculopathy, lumbar region  Pain in right hip  Cramp and spasm  Difficulty in walking, not elsewhere classified  Muscle weakness (generalized)  Abnormal posture  ONSET DATE: 09/30/2022  SUBJECTIVE:                                                                                                                                                                                           SUBJECTIVE STATEMENT: Patient reports she did the stretching twice per day since last visit.  She states no significant change at this point.      PERTINENT HISTORY:  Lumbar laminectomy 09-02-22 (3rd lumbar surgery)  PAIN:  Are you having pain? Yes: NPRS scale: 0/10 Pain location: most pain is post rest and start up pain; right hip Pain description: sore muscle thigh, and some pain in groin, describes a disconnected kind of feeling Aggravating factors: prolonged sitting Relieving factors: heat  PRECAUTIONS: Back  WEIGHT BEARING RESTRICTIONS: No  FALLS:  Has  patient fallen in last 6 months? No  LIVING ENVIRONMENT: Lives  with: lives with their spouse Lives in: House/apartment  OCCUPATION: Retired  PLOF: Independent, Independent with basic ADLs, Independent with household mobility without device, Independent with community mobility without device, Independent with homemaking with ambulation, Independent with gait, and Independent with transfers  PATIENT GOALS: to eliminate hip pain so that she can properly recover from her back surgery  NEXT MD VISIT:   OBJECTIVE:   DIAGNOSTIC FINDINGS: 07/11/22 IMPRESSION: 1. Lumbar spondylosis and degenerative disc disease, causing prominent impingement at L3-4; moderate impingement at L4-5 and L5-S1; and 3 mm degenerative anterolisthesis at L4-5 and L5-S1. 2. The dominant finding on today's exam is the right lateral recess and foraminal disc protrusion at L3-4 contributing substantially to the prominent right central and subarticular lateral recess stenosis at this level.  PATIENT SURVEYS:  FOTO 41 (predicted 38)  SCREENING FOR RED FLAGS: Bowel or bladder incontinence: No Spinal tumors: No Cauda equina syndrome: No Compression fracture: No Abdominal aneurysm: No  COGNITION: Overall cognitive status: Within functional limits for tasks assessed     SENSATION: Mild L3-L4 radicular symptoms linger in the right thigh  MUSCLE LENGTH: Hamstrings: Right 50 deg; Left 60 deg Thomas test: Right pos ; Left pos   POSTURE: decreased lumbar lordosis and posterior pelvic tilt  LUMBAR ROM:  Deferred due to 4 weeks post op: complete this at 8 weeks AROM eval  Flexion   Extension   Right lateral flexion   Left lateral flexion   Right rotation   Left rotation    (Blank rows = not tested)  LOWER EXTREMITY ROM:     WFL  LOWER EXTREMITY MMT:    Generally 4/5 right LE and 4+/5 left LE LUMBAR SPECIAL TESTS:  See diagnostic findings  FUNCTIONAL TESTS:  5 times sit to stand: 20.10 sec Timed up and go (TUG): 12.01 sec  GAIT: Distance walked: 30 Assistive  device utilized: None Level of assistance: Modified independence Comments: antalgic  TODAY'S TREATMENT:                                                                                                                              DATE: 10/08/22 Nustep x 5 min Standing hamstring stretch at steps 3 x 30 sec each LE Standing quad/hip flexor stretch at steps with chair behind 3 x 30 sec each LE Butterfly stretch x 5 holding 30 sec each PPT x 20 PPT with march x 20 PPT with alternating arms and legs x 20 Hook lying lower trunk rotation x 20 Seated piriformis stretch 3 x 30 sec  DATE: 10/01/22 Initial eval completed and initiated HEP    PATIENT EDUCATION:  Education details: Initiated HEP and educated on anatomy of the hip including labrum and possibility of labral involvement in the hip Person educated: Patient Education method: Consulting civil engineer, Media planner, Verbal cues, and Handouts Education comprehension: verbalized understanding, returned demonstration, and verbal cues required  HOME EXERCISE  PROGRAM: Access Code: [email or web address] URL: [email or web address] Date: 10/08/2022 Prepared by: Candyce Churn  Exercises - Supine Hamstring Stretch with Strap  - 1 x daily - 7 x weekly - 1 sets - 3 reps - 30 hold - Supine ITB Stretch with Strap  - 1 x daily - 7 x weekly - 1 sets - 3 reps - 30 hold - Supine Figure 4 Piriformis Stretch  - 1 x daily - 7 x weekly - 1 sets - 3 reps - 30 hold - Quadricep Stretch with Chair and Counter Support  - 1 x daily - 7 x weekly - 1 sets - 3 reps - 30 sec hold - Supine Butterfly Groin Stretch  - 1 x daily - 7 x weekly - 1 sets - 5 reps - 30 sec hold - Supine Posterior Pelvic Tilt  - 1 x daily - 7 x weekly - 2 sets - 10 reps  ASSESSMENT:  CLINICAL IMPRESSION: Elonna was able to tolerate addition of several stretching exercises and addition of core strengthening.  She is quite weak in lower abdominals and could not do any core work unsupported.  She did  very well with supported core exercises.   She would benefit from continuing skilled PT for bilateral LE stretching R>L along with post laminectomy protocol.    OBJECTIVE IMPAIRMENTS: decreased mobility, difficulty walking, decreased ROM, decreased strength, increased fascial restrictions, increased muscle spasms, impaired flexibility, impaired sensation, improper body mechanics, and pain.   ACTIVITY LIMITATIONS: carrying, lifting, bending, sitting, standing, squatting, sleeping, stairs, transfers, bed mobility, bathing, dressing, and hygiene/grooming  PARTICIPATION LIMITATIONS: meal prep, cleaning, laundry, interpersonal relationship, driving, shopping, community activity, yard work, and church  PERSONAL FACTORS: Fitness and 1-2 comorbidities: HTN, Hx breast cx  are also affecting patient's functional outcome.   REHAB POTENTIAL: Fair Patient has multiple orthopedic spine issues over a 20 year time period.  She must care for her disabled spouse but does have help now.   CLINICAL DECISION MAKING: Evolving/moderate complexity  EVALUATION COMPLEXITY: Moderate   GOALS: Goals reviewed with patient? Yes  SHORT TERM GOALS: Target date: 10/29/2022    Pain report to be no greater than 4/10  Baseline: Goal status: INITIAL  2.  Patient will be independent with initial HEP  Baseline:  Goal status: INITIAL   LONG TERM GOALS: Target date: 11/26/2022    Patient to report pain no greater than 2/10  Baseline:  Goal status: INITIAL  2.  Patient to be independent with advanced HEP  Baseline:  Goal status: INITIAL  3.  Radicular symptoms to be centralized Baseline:  Goal status: INITIAL  4.  Patient to be able to sleep through the night  Baseline:  Goal status: INITIAL  5.  Patient to report 85% improvement in overall symptoms  Baseline:  Goal status: INITIAL  6.  FOTO to improve to 61  and functional tests to improve by 2-3 seconds Baseline: FOTO 41 on eval  5 times sit to stand:  20.10 sec Timed up and go (TUG): 12.01 sec Goal status: INITIAL  PLAN:  PT FREQUENCY: 2x/week  PT DURATION: 8 weeks  PLANNED INTERVENTIONS: Therapeutic exercises, Therapeutic activity, Neuromuscular re-education, Balance training, Gait training, Patient/Family education, Self Care, Joint mobilization, Stair training, DME instructions, Aquatic Therapy, Dry Needling, Electrical stimulation, Spinal mobilization, Cryotherapy, Moist heat, Taping, Traction, Ultrasound, Ionotophoresis '4mg'$ /ml Dexamethasone, Manual therapy, and Re-evaluation.  PLAN FOR NEXT SESSION: Nustep, review HEP, add further LE stretching and Right hip strengthening   Javyon Fontan B. Ernan Runkles, PT 10/08/22  3:37 PM  [facility] [address], [place], [place] [address] Phone # [phone] Fax [phone]

## 2022-10-14 ENCOUNTER — Encounter: Payer: Medicare Other | Admitting: Physical Therapy

## 2022-10-15 ENCOUNTER — Encounter: Payer: Medicare Other | Admitting: Nurse Practitioner

## 2022-10-21 ENCOUNTER — Ambulatory Visit: Payer: Medicare Other | Admitting: Physical Therapy

## 2022-10-21 ENCOUNTER — Other Ambulatory Visit: Payer: Self-pay

## 2022-10-21 ENCOUNTER — Telehealth: Payer: Self-pay | Admitting: Nurse Practitioner

## 2022-10-21 ENCOUNTER — Other Ambulatory Visit: Payer: Self-pay | Admitting: Nurse Practitioner

## 2022-10-21 DIAGNOSIS — I10 Essential (primary) hypertension: Secondary | ICD-10-CM

## 2022-10-21 MED ORDER — OLMESARTAN MEDOXOMIL-HCTZ 40-12.5 MG PO TABS: ORAL_TABLET | ORAL | 5 refills | Status: DC

## 2022-10-21 NOTE — Telephone Encounter (Signed)
Was refilled this morning

## 2022-10-21 NOTE — Telephone Encounter (Signed)
Patient is requesting a refill on Olmesartan HCTZ 40-12.5 mg. Pharmacy tells her to ask for a cheaper option but she doesn't want anything other than what she has been on. [facility]: [place].

## 2022-10-23 ENCOUNTER — Ambulatory Visit: Payer: Medicare Other

## 2022-10-23 DIAGNOSIS — M6281 Muscle weakness (generalized): Secondary | ICD-10-CM | POA: Diagnosis not present

## 2022-10-23 DIAGNOSIS — R252 Cramp and spasm: Secondary | ICD-10-CM

## 2022-10-23 DIAGNOSIS — R262 Difficulty in walking, not elsewhere classified: Secondary | ICD-10-CM

## 2022-10-23 DIAGNOSIS — M5416 Radiculopathy, lumbar region: Secondary | ICD-10-CM

## 2022-10-23 DIAGNOSIS — M25551 Pain in right hip: Secondary | ICD-10-CM | POA: Diagnosis not present

## 2022-10-23 DIAGNOSIS — R293 Abnormal posture: Secondary | ICD-10-CM

## 2022-10-23 NOTE — Therapy (Signed)
OUTPATIENT PHYSICAL THERAPY THORACOLUMBAR TREATMENT NOTE   Patient Name: Jacqueline Jenkins MRN: [id number] DOB:[birth year], 72 y.o., female Today's Date: 10/23/2022  END OF SESSION:  PT End of Session - 10/23/22 1454     Visit Number 3    Date for PT Re-Evaluation 11/26/22    Authorization Type MEDICARE PART A AND B    PT Start Time 1452    PT Stop Time 1534    PT Time Calculation (min) 42 min    Activity Tolerance Patient limited by pain    Behavior During Therapy [facility] for tasks assessed/performed             Past Medical History:  Diagnosis Date   Allergic rhinitis, cause unspecified    Anemia    history of anemia while teenager   BRCA2 positive    Breast cancer ([place]) 2016   Left Breast Cancer   Breast cancer of upper-outer quadrant of left female breast ([place])    chemo complete 01/2016, radiation 04/2016   DDD (degenerative disc disease)    Diverticulitis    Family history of breast cancer 08/30/2015   Dx. In maternal grandmother in her 84s-40; dx. In maternal first cousin in her 40s-60s    Family history of colon cancer 08/30/2015   Dx. In maternal uncle in his 69s    Hyperlipidemia    Hypertension    Left kidney mass 04/06/2018   Mild growth from Ct 2013 to 04/06/2018; Renal US 2020 no growth- will not follow up   Neuropathy    Osteopenia 12/2011   Spine T -0.4, Femur -2.1   Personal history of chemotherapy 2016   Left Breast Cancer   Personal history of radiation therapy 2016   Left Breast Cancer   PONV (postoperative nausea and vomiting)    Splenic artery aneurysm ([place]) 2013   COILS DONE   Vitamin D deficiency    Past Surgical History:  Procedure Laterality Date   BREAST LUMPECTOMY Left 2016   BREAST LUMPECTOMY WITH RADIOACTIVE SEED AND SENTINEL LYMPH NODE BIOPSY Left 08/29/2015   Procedure: LEFT BREAST LUMPECTOMY WITH RADIOACTIVE SEED WITH AXILLARY SENTINEL LYMPH NODE BIOPSY;  Surgeon: Excell Seltzer, MD;  Location: [place];  Service:  General;  Laterality: Left;   [place]   COLONOSCOPY  Jan. 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   Dural AV fistula repair   IR ANGIO EXTERNAL CAROTID SEL EXT CAROTID BILAT MOD SED  02/03/2017   IR ANGIO EXTERNAL CAROTID SEL EXT CAROTID UNI R MOD SED  03/05/2017   IR ANGIO INTRA EXTRACRAN SEL COM CAROTID INNOMINATE UNI R MOD SED  03/05/2017   IR ANGIO INTRA EXTRACRAN SEL INTERNAL CAROTID BILAT MOD SED  02/03/2017   IR ANGIO VERTEBRAL SEL VERTEBRAL BILAT MOD SED  02/03/2017   IR ANGIOGRAM FOLLOW UP STUDY  03/05/2017   IR NEURO EACH ADD'L AFTER BASIC UNI RIGHT (MS)  02/03/2017   IR NEURO EACH ADD'L AFTER BASIC UNI RIGHT (MS)  03/05/2017   IR TRANSCATH/EMBOLIZ  03/05/2017   LAPAROSCOPIC BILATERAL SALPINGO OOPHERECTOMY Bilateral 05/16/2016   Procedure: LAPAROSCOPIC BILATERAL SALPINGO OOPHORECTOMY;  Surgeon: Paula Compton, MD;  Location: [facility];  Service: Gynecology;  Laterality: Bilateral;   LUMBAR DISC SURGERY  2001, 2010   PORTACATH PLACEMENT Bilateral 10/09/2015   Procedure: INSERTION PORT-A-CATH;  Surgeon: Excell Seltzer, MD;  Location: [facility];  Service: General;  Laterality: Bilateral;   Portacath removal     RADIOLOGY WITH  ANESTHESIA N/A 03/05/2017   Procedure: ARTERIOGRAM ONYX EMBOLIZATION OF FISTULA;  Surgeon: Consuella Lose, MD;  Location: [place];  Service: Radiology;  Laterality: N/A;   RE-EXCISION OF BREAST LUMPECTOMY Left 09/07/2015   Procedure: RE-EXCISION OF LEFT BREAST LUMPECTOMY;  Surgeon: Excell Seltzer, MD;  Location: [place];  Service: General;  Laterality: Left;   [place]   cervical diskectomy   SPINE SURGERY  2001, 2010   Lumbar diskectomy   splenic aneurysm  02/10/2012   Aneurysm of splenic artery, coil placed, Dr. 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   Genetic testing 09/19/2015   BRCA2 positive 09/19/2015   Breast cancer of upper-outer quadrant of left female breast ([place]) 08/21/2015   Medication management 06/07/2015   Essential hypertension 06/07/2015   Abnormal glucose 11/16/2013   Allergic rhinitis    Hyperlipidemia, mixed    Lumbar degenerative disc disease    Vitamin D deficiency    Aneurysm of splenic artery ([place]) 01/19/2012    PCP:    Unk Pinto, MD    REFERRING PROVIDER: Kristeen Miss, MD  REFERRING DIAG: M54.16 (ICD-10-CM) - Radiculopathy, lumbar region  Rationale for Evaluation and Treatment: Rehabilitation  THERAPY DIAG:  Radiculopathy, lumbar region  Pain in right hip  Cramp and spasm  Difficulty in walking, not elsewhere classified  Muscle weakness (generalized)  Abnormal posture  ONSET DATE: 09/30/2022  SUBJECTIVE:                                                                                                                                                                                           SUBJECTIVE STATEMENT: Patient reports no pain at the moment.         PERTINENT HISTORY:  Lumbar laminectomy 09-02-22 (3rd lumbar surgery)  PAIN:  Are you having pain? Yes: NPRS scale: 0/10 Pain location: most pain is post rest and start up pain; right hip Pain description: sore muscle thigh, and some pain in groin, describes a disconnected kind of feeling Aggravating factors: prolonged sitting Relieving factors: heat  PRECAUTIONS: Back  WEIGHT BEARING RESTRICTIONS: No  FALLS:  Has patient fallen in last 6 months? No  LIVING ENVIRONMENT: Lives  with: lives with their spouse Lives in: House/apartment  OCCUPATION:  Retired  PLOF: Independent, Independent with basic ADLs, Independent with household mobility without device, Independent with community mobility without device, Independent with homemaking with ambulation, Independent with gait, and Independent with transfers  PATIENT GOALS: to eliminate hip pain so that she can properly recover from her back surgery  NEXT MD VISIT:   OBJECTIVE:   DIAGNOSTIC FINDINGS: 07/11/22 IMPRESSION: 1. Lumbar spondylosis and degenerative disc disease, causing prominent impingement at L3-4; moderate impingement at L4-5 and L5-S1; and 3 mm degenerative anterolisthesis at L4-5 and L5-S1. 2. The dominant finding on today's exam is the right lateral recess and foraminal disc protrusion at L3-4 contributing substantially to the prominent right central and subarticular lateral recess stenosis at this level.  PATIENT SURVEYS:  FOTO 41 (predicted 59)  SCREENING FOR RED FLAGS: Bowel or bladder incontinence: No Spinal tumors: No Cauda equina syndrome: No Compression fracture: No Abdominal aneurysm: No  COGNITION: Overall cognitive status: Within functional limits for tasks assessed     SENSATION: Mild L3-L4 radicular symptoms linger in the right thigh  MUSCLE LENGTH: Hamstrings: Right 50 deg; Left 60 deg Thomas test: Right pos ; Left pos   POSTURE: decreased lumbar lordosis and posterior pelvic tilt  LUMBAR ROM:  Deferred due to 4 weeks post op: complete this at 8 weeks AROM eval  Flexion   Extension   Right lateral flexion   Left lateral flexion   Right rotation   Left rotation    (Blank rows = not tested)  LOWER EXTREMITY ROM:     WFL  LOWER EXTREMITY MMT:    Generally 4/5 right LE and 4+/5 left LE LUMBAR SPECIAL TESTS:  See diagnostic findings  FUNCTIONAL TESTS:  5 times sit to stand: 20.10 sec Timed up and go (TUG): 12.01 sec  GAIT: Distance walked: 30 Assistive device utilized: None Level of assistance: Modified  independence Comments: antalgic  TODAY'S TREATMENT:                                                                                                                              DATE: 10/23/22 Nustep x 5 min Level 1 PPT x 20 Supine hamstring stretch at steps 3 x 30 sec each LE Supine IT band stretch 2 x 30 sec PPT with march x 20 unsupported PPT with alternating arms and legs unsupported x 20 Hook lying lower trunk rotation x 20 Quadruped cat/camel x 10 Quadruped alternating hip extension x 10 each LE Quadruped alternating arms and legs x 20 Ice to lumbar spine x 10 min   DATE: 10/08/22 Nustep x 5 min Standing hamstring stretch at steps 3 x 30 sec each LE Standing quad/hip flexor stretch at steps with chair behind 3 x 30 sec each LE Butterfly stretch x 5 holding 30 sec each PPT x 20 PPT with march x 20 PPT with alternating arms and legs x 20 Hook lying lower trunk rotation  x 20 Seated piriformis stretch 3 x 30 sec  DATE: 10/01/22 Initial eval completed and initiated HEP    PATIENT EDUCATION:  Education details: Initiated HEP and educated on anatomy of the hip including labrum and possibility of labral involvement in the hip Person educated: Patient Education method: Consulting civil engineer, Demonstration, Verbal cues, and Handouts Education comprehension: verbalized understanding, returned demonstration, and verbal cues required  HOME EXERCISE PROGRAM: Access Code: [email or web address] URL: [email or web address] Date: 10/08/2022 Prepared by: Candyce Churn  Exercises - Supine Hamstring Stretch with Strap  - 1 x daily - 7 x weekly - 1 sets - 3 reps - 30 hold - Supine ITB Stretch with Strap  - 1 x daily - 7 x weekly - 1 sets - 3 reps - 30 hold - Supine Figure 4 Piriformis Stretch  - 1 x daily - 7 x weekly - 1 sets - 3 reps - 30 hold - Quadricep Stretch with Chair and Counter Support  - 1 x daily - 7 x weekly - 1 sets - 3 reps - 30 sec hold - Supine Butterfly Groin Stretch  - 1 x  daily - 7 x weekly - 1 sets - 5 reps - 30 sec hold - Supine Posterior Pelvic Tilt  - 1 x daily - 7 x weekly - 2 sets - 10 reps  ASSESSMENT:  CLINICAL IMPRESSION: Jacqueline Jenkins was able to tolerate core work unsupported today.  She is very tight in lateral hip and IT band on the right.  She had no increase in the right thigh pain with any tasks today.  She admits the thigh symptoms are still present and vary in intensity but she understands that the nerve is still healing.   She would benefit from continuing skilled PT for bilateral LE stretching R>L along with post laminectomy protocol.    OBJECTIVE IMPAIRMENTS: decreased mobility, difficulty walking, decreased ROM, decreased strength, increased fascial restrictions, increased muscle spasms, impaired flexibility, impaired sensation, improper body mechanics, and pain.   ACTIVITY LIMITATIONS: carrying, lifting, bending, sitting, standing, squatting, sleeping, stairs, transfers, bed mobility, bathing, dressing, and hygiene/grooming  PARTICIPATION LIMITATIONS: meal prep, cleaning, laundry, interpersonal relationship, driving, shopping, community activity, yard work, and church  PERSONAL FACTORS: Fitness and 1-2 comorbidities: HTN, Hx breast cx  are also affecting patient's functional outcome.   REHAB POTENTIAL: Fair Patient has multiple orthopedic spine issues over a 20 year time period.  She must care for her disabled spouse but does have help now.   CLINICAL DECISION MAKING: Evolving/moderate complexity  EVALUATION COMPLEXITY: Moderate   GOALS: Goals reviewed with patient? Yes  SHORT TERM GOALS: Target date: 10/29/2022    Pain report to be no greater than 4/10  Baseline: Goal status: INITIAL  2.  Patient will be independent with initial HEP  Baseline:  Goal status: INITIAL   LONG TERM GOALS: Target date: 11/26/2022    Patient to report pain no greater than 2/10  Baseline:  Goal status: INITIAL  2.  Patient to be independent with  advanced HEP  Baseline:  Goal status: INITIAL  3.  Radicular symptoms to be centralized Baseline:  Goal status: INITIAL  4.  Patient to be able to sleep through the night  Baseline:  Goal status: INITIAL  5.  Patient to report 85% improvement in overall symptoms  Baseline:  Goal status: INITIAL  6.  FOTO to improve to 61  and functional tests to improve by 2-3 seconds Baseline: FOTO 41 on eval  5 times sit to stand: 20.10 sec  Timed up and go (TUG): 12.01 sec Goal status: INITIAL  PLAN:  PT FREQUENCY: 2x/week  PT DURATION: 8 weeks  PLANNED INTERVENTIONS: Therapeutic exercises, Therapeutic activity, Neuromuscular re-education, Balance training, Gait training, Patient/Family education, Self Care, Joint mobilization, Stair training, DME instructions, Aquatic Therapy, Dry Needling, Electrical stimulation, Spinal mobilization, Cryotherapy, Moist heat, Taping, Traction, Ultrasound, Ionotophoresis 21m/ml Dexamethasone, Manual therapy, and Re-evaluation.  PLAN FOR NEXT SESSION: Nustep, review HEP, add further LE stretching and Right hip strengthening   Lorissa Kishbaugh B. Ravenna Legore, PT 10/23/22 5:24 PM  [place] [place]  [id number] # [phone] [phone]

## 2022-10-24 NOTE — Therapy (Unsigned)
OUTPATIENT PHYSICAL THERAPY THORACOLUMBAR TREATMENT NOTE   Patient Name: Jacqueline Jenkins MRN: [id number] DOB:1950/09/16, 72 y.o., female Today's Date: 10/27/2022  END OF SESSION:  PT End of Session - 10/27/22 1350     Visit Number 4    Date for PT Re-Evaluation 11/26/22    Authorization Type MEDICARE PART A AND B    PT Start Time 1400    PT Stop Time 1440    PT Time Calculation (min) 40 min    Activity Tolerance Patient tolerated treatment well    Behavior During Therapy WFL for tasks assessed/performed              Past Medical History:  Diagnosis Date   Allergic rhinitis, cause unspecified    Anemia    history of anemia while teenager   BRCA2 positive    Breast cancer ([place]) 2016   Left Breast Cancer   Breast cancer of upper-outer quadrant of left female breast ([place])    chemo complete 01/2016, radiation 04/2016   DDD (degenerative disc disease)    Diverticulitis    Family history of breast cancer 08/30/2015   Dx. In maternal grandmother in her 64s-40; dx. In maternal first cousin in her 18s-60s    Family history of colon cancer 08/30/2015   Dx. In maternal uncle in his 52s    Hyperlipidemia    Hypertension    Left kidney mass 04/06/2018   Mild growth from Ct 2013 to 04/06/2018; Renal US 2020 no growth- will not follow up   Neuropathy    Osteopenia 12/2011   Spine T -0.4, Femur -2.1   Personal history of chemotherapy 2016   Left Breast Cancer   Personal history of radiation therapy 2016   Left Breast Cancer   PONV (postoperative nausea and vomiting)    Splenic artery aneurysm ([place]) 2013   COILS DONE   Vitamin D deficiency    Past Surgical History:  Procedure Laterality Date   BREAST LUMPECTOMY Left 2016   BREAST LUMPECTOMY WITH RADIOACTIVE SEED AND SENTINEL LYMPH NODE BIOPSY Left 08/29/2015   Procedure: LEFT BREAST LUMPECTOMY WITH RADIOACTIVE SEED WITH AXILLARY SENTINEL LYMPH NODE BIOPSY;  Surgeon: Excell Seltzer, MD;  Location: [facility];   Service: General;  Laterality: Left;   [place]   COLONOSCOPY  Jan. 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   Dural AV fistula repair   IR ANGIO EXTERNAL CAROTID SEL EXT CAROTID BILAT MOD SED  02/03/2017   IR ANGIO EXTERNAL CAROTID SEL EXT CAROTID UNI R MOD SED  03/05/2017   IR ANGIO INTRA EXTRACRAN SEL COM CAROTID INNOMINATE UNI R MOD SED  03/05/2017   IR ANGIO INTRA EXTRACRAN SEL INTERNAL CAROTID BILAT MOD SED  02/03/2017   IR ANGIO VERTEBRAL SEL VERTEBRAL BILAT MOD SED  02/03/2017   IR ANGIOGRAM FOLLOW UP STUDY  03/05/2017   IR NEURO EACH ADD'L AFTER BASIC UNI RIGHT (MS)  02/03/2017   IR NEURO EACH ADD'L AFTER BASIC UNI RIGHT (MS)  03/05/2017   IR TRANSCATH/EMBOLIZ  03/05/2017   LAPAROSCOPIC BILATERAL SALPINGO OOPHERECTOMY Bilateral 05/16/2016   Procedure: LAPAROSCOPIC BILATERAL SALPINGO OOPHORECTOMY;  Surgeon: Paula Compton, MD;  Location: [facility];  Service: Gynecology;  Laterality: Bilateral;   LUMBAR DISC SURGERY  2001, 2010   PORTACATH PLACEMENT Bilateral 10/09/2015   Procedure: INSERTION PORT-A-CATH;  Surgeon: Excell Seltzer, MD;  Location: [facility];  Service: General;  Laterality: Bilateral;   Portacath removal     RADIOLOGY  WITH ANESTHESIA N/A 03/05/2017   Procedure: ARTERIOGRAM ONYX EMBOLIZATION OF FISTULA;  Surgeon: Consuella Lose, MD;  Location: [facility];  Service: Radiology;  Laterality: N/A;   RE-EXCISION OF BREAST LUMPECTOMY Left 09/07/2015   Procedure: RE-EXCISION OF LEFT BREAST LUMPECTOMY;  Surgeon: Excell Seltzer, MD;  Location: [facility];  Service: General;  Laterality: Left;   [place]   cervical diskectomy   SPINE SURGERY  2001, 2010   Lumbar diskectomy   splenic aneurysm  02/10/2012   Aneurysm of splenic artery, coil placed, Dr. 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   Genetic testing 09/19/2015   BRCA2 positive 09/19/2015   Breast cancer of upper-outer quadrant of left female breast ([place]) 08/21/2015   Medication management 06/07/2015   Essential hypertension 06/07/2015   Abnormal glucose 11/16/2013   Allergic rhinitis    Hyperlipidemia, mixed    Lumbar degenerative disc disease    Vitamin D deficiency    Aneurysm of splenic artery ([place]) 01/19/2012    PCP: Unk Pinto, MD  REFERRING PROVIDER: Kristeen Miss, MD  REFERRING DIAG: M54.16 (ICD-10-CM) - Radiculopathy, lumbar region  Rationale for Evaluation and Treatment: Rehabilitation  THERAPY DIAG:  Radiculopathy, lumbar region  Pain in right hip  Difficulty in walking, not elsewhere classified  Cramp and spasm  Abnormal posture  Muscle weakness (generalized)  ONSET DATE: 09/30/2022  SUBJECTIVE:                                                                                                                                                                                           SUBJECTIVE STATEMENT: Pt states that she is having pain in her R hip that is now radiating into the front of her thigh.        PERTINENT HISTORY:  Lumbar laminectomy 09-02-22 (3rd lumbar surgery)  PAIN:  Are you having pain? Yes: NPRS scale: 0/10 Pain location: most pain is post rest and start up pain; right hip Pain description: sore muscle thigh, and some pain in groin, describes a disconnected kind of feeling Aggravating factors: prolonged sitting Relieving factors: heat  PRECAUTIONS: Back  WEIGHT BEARING RESTRICTIONS: No  FALLS:  Has patient fallen  in last 6 months? No  LIVING ENVIRONMENT: Lives with: lives with their  spouse Lives in: House/apartment  OCCUPATION: Retired  PLOF: Independent, Independent with basic ADLs, Independent with household mobility without device, Independent with community mobility without device, Independent with homemaking with ambulation, Independent with gait, and Independent with transfers  PATIENT GOALS: to eliminate hip pain so that she can properly recover from her back surgery  NEXT MD VISIT:   OBJECTIVE:   DIAGNOSTIC FINDINGS: 07/11/22 IMPRESSION: 1. Lumbar spondylosis and degenerative disc disease, causing prominent impingement at L3-4; moderate impingement at L4-5 and L5-S1; and 3 mm degenerative anterolisthesis at L4-5 and L5-S1. 2. The dominant finding on today's exam is the right lateral recess and foraminal disc protrusion at L3-4 contributing substantially to the prominent right central and subarticular lateral recess stenosis at this level.  PATIENT SURVEYS:  FOTO 41 (predicted 80)  SCREENING FOR RED FLAGS: Bowel or bladder incontinence: No Spinal tumors: No Cauda equina syndrome: No Compression fracture: No Abdominal aneurysm: No  COGNITION: Overall cognitive status: Within functional limits for tasks assessed     SENSATION: Mild L3-L4 radicular symptoms linger in the right thigh  MUSCLE LENGTH: Hamstrings: Right 50 deg; Left 60 deg Thomas test: Right pos ; Left pos   POSTURE: decreased lumbar lordosis and posterior pelvic tilt  LUMBAR ROM:  Deferred due to 4 weeks post op: complete this at 8 weeks AROM eval  Flexion   Extension   Right lateral flexion   Left lateral flexion   Right rotation   Left rotation    (Blank rows = not tested)  LOWER EXTREMITY ROM:     WFL  LOWER EXTREMITY MMT:    Generally 4/5 right LE and 4+/5 left LE LUMBAR SPECIAL TESTS:  See diagnostic findings  FUNCTIONAL TESTS:  5 times sit to stand: 20.10 sec Timed up and go (TUG): 12.01 sec  GAIT: Distance walked: 30 Assistive device utilized:  None Level of assistance: Modified independence Comments: antalgic  TODAY'S TREATMENT:    DATE: 10/27/22 Nustep x 5 min Level 1 PPT x 20 Supine hamstring stretch at steps 3 x 30 sec each LE Supine IT band stretch 2 x 30 sec PPT with march x 20 unsupported Hook lying lower trunk rotation x 20 Quadruped cat/camel x 10 Quadruped alternating hip extension x 10 each LE Sit to stand x15  STM to proximal IT band.                                                                                                                              DATE: 10/23/22 Nustep x 5 min Level 1 PPT x 20 Supine hamstring stretch at steps 3 x 30 sec each LE Supine IT band stretch 2 x 30 sec PPT with march x 20 unsupported PPT with alternating arms and legs unsupported x 20 Hook lying lower trunk rotation x 20 Quadruped cat/camel x 10 Quadruped alternating hip extension x 10 each LE  Quadruped alternating arms and legs x 20 Ice to lumbar spine x 10 min   DATE: 10/08/22 Nustep x 5 min Standing hamstring stretch at steps 3 x 30 sec each LE Standing quad/hip flexor stretch at steps with chair behind 3 x 30 sec each LE Butterfly stretch x 5 holding 30 sec each PPT x 20 PPT with march x 20 PPT with alternating arms and legs x 20 Hook lying lower trunk rotation x 20 Seated piriformis stretch 3 x 30 sec  DATE: 10/01/22 Initial eval completed and initiated HEP    PATIENT EDUCATION:  Education details: Initiated HEP and educated on anatomy of the hip including labrum and possibility of labral involvement in the hip Person educated: Patient Education method: Consulting civil engineer, Media planner, Verbal cues, and Handouts Education comprehension: verbalized understanding, returned demonstration, and verbal cues required  HOME EXERCISE PROGRAM: Access Code: [email or web address] URL: [email or web address] Date: 10/08/2022 Prepared by: Candyce Churn  Exercises - Supine Hamstring Stretch with Strap  - 1 x daily - 7 x  weekly - 1 sets - 3 reps - 30 hold - Supine ITB Stretch with Strap  - 1 x daily - 7 x weekly - 1 sets - 3 reps - 30 hold - Supine Figure 4 Piriformis Stretch  - 1 x daily - 7 x weekly - 1 sets - 3 reps - 30 hold - Quadricep Stretch with Chair and Counter Support  - 1 x daily - 7 x weekly - 1 sets - 3 reps - 30 sec hold - Supine Butterfly Groin Stretch  - 1 x daily - 7 x weekly - 1 sets - 5 reps - 30 sec hold - Supine Posterior Pelvic Tilt  - 1 x daily - 7 x weekly - 2 sets - 10 reps  ASSESSMENT:  CLINICAL IMPRESSION: Malene reports to PT with a new radicular pain into the anterior thigh. She is very tight in lateral hip and IT band on the right. She had no increase in the right thigh pain with any tasks today. Continued with familiar session with no increase in symptoms. Pt responded well to [facility] today. She would benefit from continuing skilled PT for bilateral LE stretching R>L along with post laminectomy protocol.    OBJECTIVE IMPAIRMENTS: decreased mobility, difficulty walking, decreased ROM, decreased strength, increased fascial restrictions, increased muscle spasms, impaired flexibility, impaired sensation, improper body mechanics, and pain.   ACTIVITY LIMITATIONS: carrying, lifting, bending, sitting, standing, squatting, sleeping, stairs, transfers, bed mobility, bathing, dressing, and hygiene/grooming  PARTICIPATION LIMITATIONS: meal prep, cleaning, laundry, interpersonal relationship, driving, shopping, community activity, yard work, and church  PERSONAL FACTORS: Fitness and 1-2 comorbidities: HTN, Hx breast cx  are also affecting patient's functional outcome.   REHAB POTENTIAL: Fair Patient has multiple orthopedic spine issues over a 20 year time period.  She must care for her disabled spouse but does have help now.   CLINICAL DECISION MAKING: Evolving/moderate complexity  EVALUATION COMPLEXITY: Moderate   GOALS: Goals reviewed with patient? Yes  SHORT TERM GOALS: Target date:  10/29/2022    Pain report to be no greater than 4/10  Baseline: Goal status: INITIAL  2.  Patient will be independent with initial HEP  Baseline:  Goal status: INITIAL   LONG TERM GOALS: Target date: 11/26/2022    Patient to report pain no greater than 2/10  Baseline:  Goal status: INITIAL  2.  Patient to be independent with advanced HEP  Baseline:  Goal status: INITIAL  3.  Radicular symptoms to be centralized  Baseline:  Goal status: INITIAL  4.  Patient to be able to sleep through the night  Baseline:  Goal status: INITIAL  5.  Patient to report 85% improvement in overall symptoms  Baseline:  Goal status: INITIAL  6.  FOTO to improve to 61  and functional tests to improve by 2-3 seconds Baseline: FOTO 41 on eval  5 times sit to stand: 20.10 sec Timed up and go (TUG): 12.01 sec Goal status: INITIAL  PLAN:  PT FREQUENCY: 2x/week  PT DURATION: 8 weeks  PLANNED INTERVENTIONS: Therapeutic exercises, Therapeutic activity, Neuromuscular re-education, Balance training, Gait training, Patient/Family education, Self Care, Joint mobilization, Stair training, DME instructions, Aquatic Therapy, Dry Needling, Electrical stimulation, Spinal mobilization, Cryotherapy, Moist heat, Taping, Traction, Ultrasound, Ionotophoresis '4mg'$ /ml Dexamethasone, Manual therapy, and Re-evaluation.  PLAN FOR NEXT SESSION: Nustep, review HEP, add further LE stretching and Right hip strengthening  Rudi Heap PT, DPT 10/27/22  3:29 PM

## 2022-10-27 ENCOUNTER — Encounter: Payer: Self-pay | Admitting: Physical Therapy

## 2022-10-27 ENCOUNTER — Ambulatory Visit: Payer: Medicare Other | Admitting: Physical Therapy

## 2022-10-27 DIAGNOSIS — R252 Cramp and spasm: Secondary | ICD-10-CM

## 2022-10-27 DIAGNOSIS — M6281 Muscle weakness (generalized): Secondary | ICD-10-CM | POA: Diagnosis not present

## 2022-10-27 DIAGNOSIS — M25551 Pain in right hip: Secondary | ICD-10-CM

## 2022-10-27 DIAGNOSIS — R293 Abnormal posture: Secondary | ICD-10-CM

## 2022-10-27 DIAGNOSIS — R262 Difficulty in walking, not elsewhere classified: Secondary | ICD-10-CM

## 2022-10-27 DIAGNOSIS — M5416 Radiculopathy, lumbar region: Secondary | ICD-10-CM

## 2022-10-29 ENCOUNTER — Ambulatory Visit: Payer: Medicare Other

## 2022-10-29 DIAGNOSIS — R262 Difficulty in walking, not elsewhere classified: Secondary | ICD-10-CM

## 2022-10-29 DIAGNOSIS — R252 Cramp and spasm: Secondary | ICD-10-CM

## 2022-10-29 DIAGNOSIS — R293 Abnormal posture: Secondary | ICD-10-CM

## 2022-10-29 DIAGNOSIS — M25551 Pain in right hip: Secondary | ICD-10-CM | POA: Diagnosis not present

## 2022-10-29 DIAGNOSIS — M6281 Muscle weakness (generalized): Secondary | ICD-10-CM | POA: Diagnosis not present

## 2022-10-29 DIAGNOSIS — M5416 Radiculopathy, lumbar region: Secondary | ICD-10-CM

## 2022-10-29 NOTE — Therapy (Signed)
OUTPATIENT PHYSICAL THERAPY THORACOLUMBAR TREATMENT NOTE   Patient Name: Jacqueline Jenkins MRN: [id number] DOB:October 29, 1950, 72 y.o., female Today's Date: 10/29/2022  END OF SESSION:  PT End of Session - 10/29/22 1447     Visit Number 5    Date for PT Re-Evaluation 11/26/22    Authorization Type MEDICARE PART A AND B    PT Start Time 1447    PT Stop Time 1529    PT Time Calculation (min) 42 min    Activity Tolerance Patient tolerated treatment well    Behavior During Therapy WFL for tasks assessed/performed              Past Medical History:  Diagnosis Date   Allergic rhinitis, cause unspecified    Anemia    history of anemia while teenager   BRCA2 positive    Breast cancer ([place]) 2016   Left Breast Cancer   Breast cancer of upper-outer quadrant of left female breast ([place])    chemo complete 01/2016, radiation 04/2016   DDD (degenerative disc disease)    Diverticulitis    Family history of breast cancer 08/30/2015   Dx. In maternal grandmother in her 1s-40; dx. In maternal first cousin in her 34s-60s    Family history of colon cancer 08/30/2015   Dx. In maternal uncle in his 29s    Hyperlipidemia    Hypertension    Left kidney mass 04/06/2018   Mild growth from Ct 2013 to 04/06/2018; Renal US 2020 no growth- will not follow up   Neuropathy    Osteopenia 12/2011   Spine T -0.4, Femur -2.1   Personal history of chemotherapy 2016   Left Breast Cancer   Personal history of radiation therapy 2016   Left Breast Cancer   PONV (postoperative nausea and vomiting)    Splenic artery aneurysm ([place]) 2013   COILS DONE   Vitamin D deficiency    Past Surgical History:  Procedure Laterality Date   BREAST LUMPECTOMY Left 2016   BREAST LUMPECTOMY WITH RADIOACTIVE SEED AND SENTINEL LYMPH NODE BIOPSY Left 08/29/2015   Procedure: LEFT BREAST LUMPECTOMY WITH RADIOACTIVE SEED WITH AXILLARY SENTINEL LYMPH NODE BIOPSY;  Surgeon: Excell Seltzer, MD;  Location: [facility];   Service: General;  Laterality: Left;   [place]   COLONOSCOPY  Jan. 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   Dural AV fistula repair   IR ANGIO EXTERNAL CAROTID SEL EXT CAROTID BILAT MOD SED  02/03/2017   IR ANGIO EXTERNAL CAROTID SEL EXT CAROTID UNI R MOD SED  03/05/2017   IR ANGIO INTRA EXTRACRAN SEL COM CAROTID INNOMINATE UNI R MOD SED  03/05/2017   IR ANGIO INTRA EXTRACRAN SEL INTERNAL CAROTID BILAT MOD SED  02/03/2017   IR ANGIO VERTEBRAL SEL VERTEBRAL BILAT MOD SED  02/03/2017   IR ANGIOGRAM FOLLOW UP STUDY  03/05/2017   IR NEURO EACH ADD'L AFTER BASIC UNI RIGHT (MS)  02/03/2017   IR NEURO EACH ADD'L AFTER BASIC UNI RIGHT (MS)  03/05/2017   IR TRANSCATH/EMBOLIZ  03/05/2017   LAPAROSCOPIC BILATERAL SALPINGO OOPHERECTOMY Bilateral 05/16/2016   Procedure: LAPAROSCOPIC BILATERAL SALPINGO OOPHORECTOMY;  Surgeon: Paula Compton, MD;  Location: [facility];  Service: Gynecology;  Laterality: Bilateral;   LUMBAR DISC SURGERY  2001, 2010   PORTACATH PLACEMENT Bilateral 10/09/2015   Procedure: INSERTION PORT-A-CATH;  Surgeon: Excell Seltzer, MD;  Location: [facility];  Service: General;  Laterality: Bilateral;   Portacath removal     RADIOLOGY  WITH ANESTHESIA N/A 03/05/2017   Procedure: ARTERIOGRAM ONYX EMBOLIZATION OF FISTULA;  Surgeon: Consuella Lose, MD;  Location: [facility];  Service: Radiology;  Laterality: N/A;   RE-EXCISION OF BREAST LUMPECTOMY Left 09/07/2015   Procedure: RE-EXCISION OF LEFT BREAST LUMPECTOMY;  Surgeon: Excell Seltzer, MD;  Location: [facility];  Service: General;  Laterality: Left;   [place]   cervical diskectomy   SPINE SURGERY  2001, 2010   Lumbar diskectomy   splenic aneurysm  02/10/2012   Aneurysm of splenic artery, coil placed, Dr. 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   Genetic testing 09/19/2015   BRCA2 positive 09/19/2015   Breast cancer of upper-outer quadrant of left female breast ([place]) 08/21/2015   Medication management 06/07/2015   Essential hypertension 06/07/2015   Abnormal glucose 11/16/2013   Allergic rhinitis    Hyperlipidemia, mixed    Lumbar degenerative disc disease    Vitamin D deficiency    Aneurysm of splenic artery ([place]) 01/19/2012    PCP: Unk Pinto, MD  REFERRING PROVIDER: Kristeen Miss, MD  REFERRING DIAG: M54.16 (ICD-10-CM) - Radiculopathy, lumbar region  Rationale for Evaluation and Treatment: Rehabilitation  THERAPY DIAG:  Radiculopathy, lumbar region  Pain in right hip  Difficulty in walking, not elsewhere classified  Cramp and spasm  Abnormal posture  Muscle weakness (generalized)  ONSET DATE: 09/30/2022  SUBJECTIVE:                                                                                                                                                                                           SUBJECTIVE STATEMENT: Pt states that she is "about the same".  I do feel less tight but right thigh pain is still present.         PERTINENT HISTORY:  Lumbar laminectomy 09-02-22 (3rd lumbar surgery)  PAIN:  Are you having pain? Yes: NPRS scale: 0/10 Pain location: most pain is post rest and start up pain; right hip Pain description: sore muscle thigh, and some pain in groin, describes a disconnected kind of feeling Aggravating factors: prolonged sitting Relieving factors: heat  PRECAUTIONS: Back  WEIGHT BEARING RESTRICTIONS: No  FALLS:  Has patient  fallen in last 6 months? No  LIVING ENVIRONMENT: Lives with: lives with  their spouse Lives in: House/apartment  OCCUPATION: Retired  PLOF: Independent, Independent with basic ADLs, Independent with household mobility without device, Independent with community mobility without device, Independent with homemaking with ambulation, Independent with gait, and Independent with transfers  PATIENT GOALS: to eliminate hip pain so that she can properly recover from her back surgery  NEXT MD VISIT:   OBJECTIVE:   DIAGNOSTIC FINDINGS: 07/11/22 IMPRESSION: 1. Lumbar spondylosis and degenerative disc disease, causing prominent impingement at L3-4; moderate impingement at L4-5 and L5-S1; and 3 mm degenerative anterolisthesis at L4-5 and L5-S1. 2. The dominant finding on today's exam is the right lateral recess and foraminal disc protrusion at L3-4 contributing substantially to the prominent right central and subarticular lateral recess stenosis at this level.  PATIENT SURVEYS:  FOTO 41 (predicted 14)  SCREENING FOR RED FLAGS: Bowel or bladder incontinence: No Spinal tumors: No Cauda equina syndrome: No Compression fracture: No Abdominal aneurysm: No  COGNITION: Overall cognitive status: Within functional limits for tasks assessed     SENSATION: Mild L3-L4 radicular symptoms linger in the right thigh  MUSCLE LENGTH: Hamstrings: Right 50 deg; Left 60 deg Thomas test: Right pos ; Left pos   POSTURE: decreased lumbar lordosis and posterior pelvic tilt  LUMBAR ROM:  Deferred due to 4 weeks post op: complete this at 8 weeks AROM eval  Flexion   Extension   Right lateral flexion   Left lateral flexion   Right rotation   Left rotation    (Blank rows = not tested)  LOWER EXTREMITY ROM:     WFL  LOWER EXTREMITY MMT:    Generally 4/5 right LE and 4+/5 left LE LUMBAR SPECIAL TESTS:  See diagnostic findings  FUNCTIONAL TESTS:  5 times sit to stand: 20.10 sec Timed up and go (TUG): 12.01 sec  GAIT: Distance walked: 30 Assistive device utilized:  None Level of assistance: Modified independence Comments: antalgic  TODAY'S TREATMENT:    DATE: 10/29/22 Nustep x 5 min Level 1 Standing hamstring stretch at steps 3 x 30 sec each LE Standing hip flexor/quad stretch 3 x 30 sec each LE Supine clam with red loop x 20 Side lying clam with red loop x 20 each side Side lying hip abduction x 20 each side Supine bridge x 20 Supine bridge with clam with green band 2 x 10 Lateral band walks at counter with green band x 3 laps at counter Issued updated HEP to include hip strengthening   DATE: 10/27/22 Nustep x 5 min Level 1 PPT x 20 Supine hamstring stretch at steps 3 x 30 sec each LE Supine IT band stretch 2 x 30 sec PPT with march x 20 unsupported Hook lying lower trunk rotation x 20 Quadruped cat/camel x 10 Quadruped alternating hip extension x 10 each LE Sit to stand x15  STM to proximal IT band.  DATE: 10/23/22 Nustep x 5 min Level 1 PPT x 20 Supine hamstring stretch at steps 3 x 30 sec each LE Supine IT band stretch 2 x 30 sec PPT with march x 20 unsupported PPT with alternating arms and legs unsupported x 20 Hook lying lower trunk rotation x 20 Quadruped cat/camel x 10 Quadruped alternating hip extension x 10 each LE Quadruped alternating arms and legs x 20 Ice to lumbar spine x 10 min   PATIENT EDUCATION:  Education details: Initiated HEP and educated on anatomy of the hip including labrum and possibility of labral involvement in the hip Person educated: Patient Education method: Consulting civil engineer, Media planner, Verbal cues, and Handouts Education comprehension: verbalized understanding, returned demonstration, and verbal cues required  HOME EXERCISE PROGRAM: Access Code: [email or web address] URL: [email or web address] Date: 10/29/2022 Prepared by: Candyce Churn  Exercises - Supine Hamstring  Stretch with Strap  - 1 x daily - 7 x weekly - 1 sets - 3 reps - 30 hold - Supine ITB Stretch with Strap  - 1 x daily - 7 x weekly - 1 sets - 3 reps - 30 hold - Supine Figure 4 Piriformis Stretch  - 1 x daily - 7 x weekly - 1 sets - 3 reps - 30 hold - Quadricep Stretch with Chair and Counter Support  - 1 x daily - 7 x weekly - 1 sets - 3 reps - 30 sec hold - Supine Butterfly Groin Stretch  - 1 x daily - 7 x weekly - 1 sets - 5 reps - 30 sec hold - Supine Posterior Pelvic Tilt  - 1 x daily - 7 x weekly - 2 sets - 10 reps - Supine Bridge  - 1 x daily - 7 x weekly - 2 sets - 10 reps - Hooklying Clamshell with Resistance  - 1 x daily - 7 x weekly - 2 sets - 10 reps - Clamshell with Resistance  - 1 x daily - 7 x weekly - 2 sets - 10 reps - Sidelying Hip Abduction  - 1 x daily - 7 x weekly - 2 sets - 10 reps - Side Stepping with Resistance at Ankles  - 1 x daily - 7 x weekly - 3 sets - 10 reps  ASSESSMENT:  CLINICAL IMPRESSION: Erianny needs reminders to count on her exercises.  She was able to do all hip exercises with minimal fatigue with increased resistance.  She continues to have a very pronounced trendelenburg gait but her pain level is well controlled.  She does continue to experience the right thigh pain.  She is compliant with her HEP and well motivated.  She should continue to improve.   She would benefit from continuing skilled PT for bilateral LE stretching R>L along with post laminectomy protocol.    OBJECTIVE IMPAIRMENTS: decreased mobility, difficulty walking, decreased ROM, decreased strength, increased fascial restrictions, increased muscle spasms, impaired flexibility, impaired sensation, improper body mechanics, and pain.   ACTIVITY LIMITATIONS: carrying, lifting, bending, sitting, standing, squatting, sleeping, stairs, transfers, bed mobility, bathing, dressing, and hygiene/grooming  PARTICIPATION LIMITATIONS: meal prep, cleaning, laundry, interpersonal relationship, driving,  shopping, community activity, yard work, and church  PERSONAL FACTORS: Fitness and 1-2 comorbidities: HTN, Hx breast cx  are also affecting patient's functional outcome.   REHAB POTENTIAL: Fair Patient has multiple orthopedic spine issues over a 20 year time period.  She must care for her disabled spouse but does have help now.   CLINICAL DECISION MAKING: Evolving/moderate complexity  EVALUATION COMPLEXITY: Moderate  GOALS: Goals reviewed with patient? Yes  SHORT TERM GOALS: Target date: 10/29/2022    Pain report to be no greater than 4/10  Baseline: Goal status: IN PROGRESS  2.  Patient will be independent with initial HEP  Baseline:  Goal status: MET   LONG TERM GOALS: Target date: 11/26/2022    Patient to report pain no greater than 2/10  Baseline:  Goal status: INITIAL  2.  Patient to be independent with advanced HEP  Baseline:  Goal status: INITIAL  3.  Radicular symptoms to be centralized Baseline:  Goal status: INITIAL  4.  Patient to be able to sleep through the night  Baseline:  Goal status: INITIAL  5.  Patient to report 85% improvement in overall symptoms  Baseline:  Goal status: INITIAL  6.  FOTO to improve to 61  and functional tests to improve by 2-3 seconds Baseline: FOTO 41 on eval  5 times sit to stand: 20.10 sec Timed up and go (TUG): 12.01 sec Goal status: INITIAL  PLAN:  PT FREQUENCY: 2x/week  PT DURATION: 8 weeks  PLANNED INTERVENTIONS: Therapeutic exercises, Therapeutic activity, Neuromuscular re-education, Balance training, Gait training, Patient/Family education, Self Care, Joint mobilization, Stair training, DME instructions, Aquatic Therapy, Dry Needling, Electrical stimulation, Spinal mobilization, Cryotherapy, Moist heat, Taping, Traction, Ultrasound, Ionotophoresis '4mg'$ /ml Dexamethasone, Manual therapy, and Re-evaluation.  PLAN FOR NEXT SESSION: Nustep, review and update HEP, add further LE stretching and Right hip  strengthening  Detravion Tester B. Jissell Trafton, PT 10/29/22 3:32 PM  [facility] [address], [place], [place] [address] Phone # [phone] Fax [phone]

## 2022-11-11 ENCOUNTER — Ambulatory Visit: Payer: Medicare Other | Attending: Neurological Surgery

## 2022-11-11 DIAGNOSIS — R262 Difficulty in walking, not elsewhere classified: Secondary | ICD-10-CM | POA: Diagnosis not present

## 2022-11-11 DIAGNOSIS — R293 Abnormal posture: Secondary | ICD-10-CM | POA: Diagnosis not present

## 2022-11-11 DIAGNOSIS — M5416 Radiculopathy, lumbar region: Secondary | ICD-10-CM | POA: Diagnosis not present

## 2022-11-11 DIAGNOSIS — R252 Cramp and spasm: Secondary | ICD-10-CM | POA: Insufficient documentation

## 2022-11-11 DIAGNOSIS — M25551 Pain in right hip: Secondary | ICD-10-CM | POA: Diagnosis not present

## 2022-11-11 DIAGNOSIS — M6281 Muscle weakness (generalized): Secondary | ICD-10-CM | POA: Insufficient documentation

## 2022-11-11 NOTE — Therapy (Signed)
OUTPATIENT PHYSICAL THERAPY THORACOLUMBAR TREATMENT NOTE   Patient Name: Jacqueline Jenkins MRN: [id number] DOB:1951/05/03, 72 y.o., female Today's Date: 11/11/2022  END OF SESSION:  PT End of Session - 11/11/22 1453     Visit Number 6    Date for PT Re-Evaluation 11/26/22    Authorization Type MEDICARE PART A AND B    PT Start Time 1449    PT Stop Time 1530    PT Time Calculation (min) 41 min    Activity Tolerance Patient tolerated treatment well    Behavior During Therapy WFL for tasks assessed/performed              Past Medical History:  Diagnosis Date   Allergic rhinitis, cause unspecified    Anemia    history of anemia while teenager   BRCA2 positive    Breast cancer ([place]) 2016   Left Breast Cancer   Breast cancer of upper-outer quadrant of left female breast ([place])    chemo complete 01/2016, radiation 04/2016   DDD (degenerative disc disease)    Diverticulitis    Family history of breast cancer 08/30/2015   Dx. In maternal grandmother in her 28s-40; dx. In maternal first cousin in her 26s-60s    Family history of colon cancer 08/30/2015   Dx. In maternal uncle in his 31s    Hyperlipidemia    Hypertension    Left kidney mass 04/06/2018   Mild growth from Ct 2013 to 04/06/2018; Renal US 2020 no growth- will not follow up   Neuropathy    Osteopenia 12/2011   Spine T -0.4, Femur -2.1   Personal history of chemotherapy 2016   Left Breast Cancer   Personal history of radiation therapy 2016   Left Breast Cancer   PONV (postoperative nausea and vomiting)    Splenic artery aneurysm ([place]) 2013   COILS DONE   Vitamin D deficiency    Past Surgical History:  Procedure Laterality Date   BREAST LUMPECTOMY Left 2016   BREAST LUMPECTOMY WITH RADIOACTIVE SEED AND SENTINEL LYMPH NODE BIOPSY Left 08/29/2015   Procedure: LEFT BREAST LUMPECTOMY WITH RADIOACTIVE SEED WITH AXILLARY SENTINEL LYMPH NODE BIOPSY;  Surgeon: Excell Seltzer, MD;  Location: [facility];   Service: General;  Laterality: Left;   [place]   COLONOSCOPY  Jan. 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   Dural AV fistula repair   IR ANGIO EXTERNAL CAROTID SEL EXT CAROTID BILAT MOD SED  02/03/2017   IR ANGIO EXTERNAL CAROTID SEL EXT CAROTID UNI R MOD SED  03/05/2017   IR ANGIO INTRA EXTRACRAN SEL COM CAROTID INNOMINATE UNI R MOD SED  03/05/2017   IR ANGIO INTRA EXTRACRAN SEL INTERNAL CAROTID BILAT MOD SED  02/03/2017   IR ANGIO VERTEBRAL SEL VERTEBRAL BILAT MOD SED  02/03/2017   IR ANGIOGRAM FOLLOW UP STUDY  03/05/2017   IR NEURO EACH ADD'L AFTER BASIC UNI RIGHT (MS)  02/03/2017   IR NEURO EACH ADD'L AFTER BASIC UNI RIGHT (MS)  03/05/2017   IR TRANSCATH/EMBOLIZ  03/05/2017   LAPAROSCOPIC BILATERAL SALPINGO OOPHERECTOMY Bilateral 05/16/2016   Procedure: LAPAROSCOPIC BILATERAL SALPINGO OOPHORECTOMY;  Surgeon: Paula Compton, MD;  Location: [facility];  Service: Gynecology;  Laterality: Bilateral;   LUMBAR DISC SURGERY  2001, 2010   PORTACATH PLACEMENT Bilateral 10/09/2015   Procedure: INSERTION PORT-A-CATH;  Surgeon: Excell Seltzer, MD;  Location: [facility];  Service: General;  Laterality: Bilateral;   Portacath removal     RADIOLOGY  WITH ANESTHESIA N/A 03/05/2017   Procedure: ARTERIOGRAM ONYX EMBOLIZATION OF FISTULA;  Surgeon: Consuella Lose, MD;  Location: [facility];  Service: Radiology;  Laterality: N/A;   RE-EXCISION OF BREAST LUMPECTOMY Left 09/07/2015   Procedure: RE-EXCISION OF LEFT BREAST LUMPECTOMY;  Surgeon: Excell Seltzer, MD;  Location: [facility];  Service: General;  Laterality: Left;   [place]   cervical diskectomy   SPINE SURGERY  2001, 2010   Lumbar diskectomy   splenic aneurysm  02/10/2012   Aneurysm of splenic artery, coil placed, Dr. 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/05/2017   Genetic testing 09/19/2015   BRCA2 positive 09/19/2015   Breast cancer of upper-outer quadrant of left female breast ([place]) 08/21/2015   Medication management 06/07/2015   Essential hypertension 06/07/2015   Abnormal glucose 11/16/2013   Allergic rhinitis    Hyperlipidemia, mixed    Lumbar degenerative disc disease    Vitamin D deficiency    Aneurysm of splenic artery ([place]) 01/19/2012    PCP: Unk Pinto, MD  REFERRING PROVIDER: Kristeen Miss, MD  REFERRING DIAG: M54.16 (ICD-10-CM) - Radiculopathy, lumbar region  Rationale for Evaluation and Treatment: Rehabilitation  THERAPY DIAG:  Radiculopathy, lumbar region  Pain in right hip  Difficulty in walking, not elsewhere classified  Cramp and spasm  Muscle weakness (generalized)  Abnormal posture  ONSET DATE: 09/30/2022  SUBJECTIVE:                                                                                                                                                                                           SUBJECTIVE STATEMENT: Pt states that she is doing ok.  "The bursa pain is much better but the thigh pain is generally the same.  It may be a little less frequent. It does come and go"        PERTINENT HISTORY:  Lumbar laminectomy 09-02-22 (3rd lumbar surgery)  PAIN:  Are you having pain? Yes: NPRS scale: 0/10 Pain location: most pain is post rest and start up pain; right hip Pain description: sore muscle thigh, and some pain in groin, describes a disconnected kind of feeling Aggravating factors: prolonged sitting Relieving factors: heat  PRECAUTIONS: Back  WEIGHT BEARING RESTRICTIONS: No  FALLS:  Has patient fallen in last 6  months? No  LIVING ENVIRONMENT: Lives with: lives with their spouse Lives in: House/apartment  OCCUPATION: Retired  PLOF: Independent, Independent with basic ADLs, Independent with household mobility without device, Independent with community mobility without device, Independent with homemaking with ambulation, Independent with gait, and Independent with transfers  PATIENT GOALS: to eliminate hip pain so that she can properly recover from her back surgery  NEXT MD VISIT:   OBJECTIVE:   DIAGNOSTIC FINDINGS: 07/11/22 IMPRESSION: 1. Lumbar spondylosis and degenerative disc disease, causing prominent impingement at L3-4; moderate impingement at L4-5 and L5-S1; and 3 mm degenerative anterolisthesis at L4-5 and L5-S1. 2. The dominant finding on today's exam is the right lateral recess and foraminal disc protrusion at L3-4 contributing substantially to the prominent right central and subarticular lateral recess stenosis at this level.  PATIENT SURVEYS:  FOTO 41 (predicted 11)  SCREENING FOR RED FLAGS: Bowel or bladder incontinence: No Spinal tumors: No Cauda equina syndrome: No Compression fracture: No Abdominal aneurysm: No  COGNITION: Overall cognitive status: Within functional limits for tasks assessed     SENSATION: Mild L3-L4 radicular symptoms linger in the right thigh  MUSCLE LENGTH: Hamstrings: Right 50 deg; Left 60 deg Thomas test: Right pos ; Left pos   POSTURE: decreased lumbar lordosis and posterior pelvic tilt  LUMBAR ROM:  Deferred due to 4 weeks post op: complete this at 8 weeks AROM eval  Flexion   Extension   Right lateral flexion   Left lateral flexion   Right rotation   Left rotation    (Blank rows = not tested)  LOWER EXTREMITY ROM:     WFL  LOWER EXTREMITY MMT:    Generally 4/5 right LE and 4+/5 left LE LUMBAR SPECIAL TESTS:  See diagnostic findings  FUNCTIONAL TESTS:  5 times sit to stand: 20.10 sec Timed up and go (TUG): 12.01  sec  GAIT: Distance walked: 30 Assistive device utilized: None Level of assistance: Modified independence Comments: antalgic  TODAY'S TREATMENT:    DATE: 11/11/22 Nustep x 5 min Level 1 Lateral band walks at counter with blue band x 3 laps at counter Standing hamstring stretch at steps 3 x 30 sec each LE Standing hip flexor/quad stretch 3 x 30 sec each LE Sit to stand x 5 Squats x 5  Supine clam with red loop x 20 Supine bridge with clam with red loop 2 x 10 Side lying clam with red loop x 20 each side Side lying hip abduction x 20 each side   DATE: 10/29/22 Nustep x 5 min Level 1 Standing hamstring stretch at steps 3 x 30 sec each LE Standing hip flexor/quad stretch 3 x 30 sec each LE Supine clam with red loop x 20 Side lying clam with red loop x 20 each side Side lying hip abduction x 20 each side Supine bridge x 20 Supine bridge with clam with green band 2 x 10 Lateral band walks at counter with green band x 3 laps at counter Issued updated HEP to include hip strengthening   DATE: 10/27/22 Nustep x 5 min Level 1 PPT x 20 Supine hamstring stretch at steps 3 x 30 sec each LE Supine IT band stretch 2 x 30 sec PPT with march x 20 unsupported Hook lying lower trunk rotation x 20 Quadruped cat/camel x 10 Quadruped alternating hip extension x 10 each LE Sit to stand x15  STM to proximal  IT band.                                                                                                                               PATIENT EDUCATION:  Education details: Initiated HEP and educated on anatomy of the hip including labrum and possibility of labral involvement in the hip Person educated: Patient Education method: Consulting civil engineer, Demonstration, Verbal cues, and Handouts Education comprehension: verbalized understanding, returned demonstration, and verbal cues required  HOME EXERCISE PROGRAM: Access Code: [email or web address] URL: [email or web address] Date:  10/29/2022 Prepared by: Candyce Churn  Exercises - Supine Hamstring Stretch with Strap  - 1 x daily - 7 x weekly - 1 sets - 3 reps - 30 hold - Supine ITB Stretch with Strap  - 1 x daily - 7 x weekly - 1 sets - 3 reps - 30 hold - Supine Figure 4 Piriformis Stretch  - 1 x daily - 7 x weekly - 1 sets - 3 reps - 30 hold - Quadricep Stretch with Chair and Counter Support  - 1 x daily - 7 x weekly - 1 sets - 3 reps - 30 sec hold - Supine Butterfly Groin Stretch  - 1 x daily - 7 x weekly - 1 sets - 5 reps - 30 sec hold - Supine Posterior Pelvic Tilt  - 1 x daily - 7 x weekly - 2 sets - 10 reps - Supine Bridge  - 1 x daily - 7 x weekly - 2 sets - 10 reps - Hooklying Clamshell with Resistance  - 1 x daily - 7 x weekly - 2 sets - 10 reps - Clamshell with Resistance  - 1 x daily - 7 x weekly - 2 sets - 10 reps - Sidelying Hip Abduction  - 1 x daily - 7 x weekly - 2 sets - 10 reps - Side Stepping with Resistance at Ankles  - 1 x daily - 7 x weekly - 3 sets - 10 reps  ASSESSMENT:  CLINICAL IMPRESSION: Semra appears to be gaining good core strength but left gluteus medius strength is still poor.  She has good strength throughout, otherwise.    However, this results in fairly pronounced trendelenburg gait which will continue to affect her lumbar spine.  We are continuing to focus on this and she should continue to improve.  She would benefit from continuing skilled PT for bilateral LE stretching R>L along with post laminectomy protocol.    OBJECTIVE IMPAIRMENTS: decreased mobility, difficulty walking, decreased ROM, decreased strength, increased fascial restrictions, increased muscle spasms, impaired flexibility, impaired sensation, improper body mechanics, and pain.   ACTIVITY LIMITATIONS: carrying, lifting, bending, sitting, standing, squatting, sleeping, stairs, transfers, bed mobility, bathing, dressing, and hygiene/grooming  PARTICIPATION LIMITATIONS: meal prep, cleaning, laundry, interpersonal  relationship, driving, shopping, community activity, yard work, and church  PERSONAL FACTORS: Fitness and 1-2 comorbidities: HTN, Hx breast cx  are also affecting patient's functional outcome.   REHAB POTENTIAL: Fair Patient  has multiple orthopedic spine issues over a 20 year time period.  She must care for her disabled spouse but does have help now.   CLINICAL DECISION MAKING: Evolving/moderate complexity  EVALUATION COMPLEXITY: Moderate   GOALS: Goals reviewed with patient? Yes  SHORT TERM GOALS: Target date: 10/29/2022    Pain report to be no greater than 4/10  Baseline: Goal status: IN PROGRESS  2.  Patient will be independent with initial HEP  Baseline:  Goal status: MET   LONG TERM GOALS: Target date: 11/26/2022    Patient to report pain no greater than 2/10  Baseline:  Goal status: INITIAL  2.  Patient to be independent with advanced HEP  Baseline:  Goal status: INITIAL  3.  Radicular symptoms to be centralized Baseline:  Goal status: INITIAL  4.  Patient to be able to sleep through the night  Baseline:  Goal status: INITIAL  5.  Patient to report 85% improvement in overall symptoms  Baseline:  Goal status: INITIAL  6.  FOTO to improve to 61  and functional tests to improve by 2-3 seconds Baseline: FOTO 41 on eval  5 times sit to stand: 20.10 sec Timed up and go (TUG): 12.01 sec Goal status: INITIAL  PLAN:  PT FREQUENCY: 2x/week  PT DURATION: 8 weeks  PLANNED INTERVENTIONS: Therapeutic exercises, Therapeutic activity, Neuromuscular re-education, Balance training, Gait training, Patient/Family education, Self Care, Joint mobilization, Stair training, DME instructions, Aquatic Therapy, Dry Needling, Electrical stimulation, Spinal mobilization, Cryotherapy, Moist heat, Taping, Traction, Ultrasound, Ionotophoresis '4mg'$ /ml Dexamethasone, Manual therapy, and Re-evaluation.  PLAN FOR NEXT SESSION: Nustep, review and update HEP, add further LE stretching and  Right hip strengthening along with left gluteus medius strengthening  Markia Kyer B. Makyah Lavigne, PT 11/11/22 5:22 PM  [facility] [address] [place], [place] [address] Phone # [phone] Fax [phone]

## 2022-11-12 ENCOUNTER — Ambulatory Visit (INDEPENDENT_AMBULATORY_CARE_PROVIDER_SITE_OTHER): Payer: Medicare Other | Admitting: Nurse Practitioner

## 2022-11-12 ENCOUNTER — Encounter: Payer: Self-pay | Admitting: Nurse Practitioner

## 2022-11-12 VITALS — BP 148/84 | HR 71 | Temp 97.7°F | Ht 63.75 in | Wt 160.8 lb

## 2022-11-12 DIAGNOSIS — I10 Essential (primary) hypertension: Secondary | ICD-10-CM | POA: Diagnosis not present

## 2022-11-12 DIAGNOSIS — I728 Aneurysm of other specified arteries: Secondary | ICD-10-CM

## 2022-11-12 DIAGNOSIS — I700 Atherosclerosis of aorta: Secondary | ICD-10-CM

## 2022-11-12 DIAGNOSIS — E782 Mixed hyperlipidemia: Secondary | ICD-10-CM

## 2022-11-12 DIAGNOSIS — Z136 Encounter for screening for cardiovascular disorders: Secondary | ICD-10-CM | POA: Diagnosis not present

## 2022-11-12 DIAGNOSIS — E559 Vitamin D deficiency, unspecified: Secondary | ICD-10-CM | POA: Diagnosis not present

## 2022-11-12 DIAGNOSIS — Z9889 Other specified postprocedural states: Secondary | ICD-10-CM

## 2022-11-12 DIAGNOSIS — Z1389 Encounter for screening for other disorder: Secondary | ICD-10-CM

## 2022-11-12 DIAGNOSIS — R7309 Other abnormal glucose: Secondary | ICD-10-CM

## 2022-11-12 DIAGNOSIS — C50412 Malignant neoplasm of upper-outer quadrant of left female breast: Secondary | ICD-10-CM

## 2022-11-12 DIAGNOSIS — I671 Cerebral aneurysm, nonruptured: Secondary | ICD-10-CM

## 2022-11-12 DIAGNOSIS — Z0001 Encounter for general adult medical examination with abnormal findings: Secondary | ICD-10-CM

## 2022-11-12 DIAGNOSIS — Z79899 Other long term (current) drug therapy: Secondary | ICD-10-CM

## 2022-11-12 NOTE — Progress Notes (Signed)
CPE  Assessment:   Encounter for Annual Physical Exam with abnormal findings Due annually  Health Maintenance reviewed Healthy lifestyle reviewed and goals set  Atherosclerosis of aorta ([facility]) - per abd CT 2021/Hyperlipidemia Discussed lifestyle modifications. Recommended diet heavy in fruits and veggies, omega 3's. Decrease consumption of animal meats, cheeses, and dairy products. Remain active and exercise as tolerated. Continue to monitor. Check lipids/TSH  Aneurysm of splenic artery [facility]) Vascular Dr. Trula Slade following, [place] planned 2025 Control blood pressure, cholesterol, glucose, increase exercise.   Essential hypertension Discussed DASH (Dietary Approaches to Stop Hypertension) DASH diet is lower in sodium than a typical American diet. Cut back on foods that are high in saturated fat, cholesterol, and trans fats. Eat more whole-grain foods, fish, poultry, and nuts Remain active and exercise as tolerated daily.  Monitor BP at home-Call if greater than 130/80.  Check CMP/CBC   Hx of prediabetes Recent A1Cs at goal Education: Reviewed 'ABCs' of diabetes management  Discussed goals to be met and/or maintained include A1C (<7) Blood pressure (<130/80) Cholesterol (LDL <70) Continue Eye Exam yearly  Continue Dental Exam Q6 mo Discussed dietary recommendations Discussed Physical Activity recommendations Foot exam UTD Check A1C  Medication management All medications discussed and reviewed in full. All questions and concerns regarding medications addressed.    Malignant neoplasm of upper-outer quadrant of left breast in female, estrogen receptor positive ([place]) Continue follow up Dr. Lindi Adie On Anastrozole UTD mammogram, alternating q63mwith MRI  Vitamin D deficiency Continue supplement for goal 60-100;  Monitor Vitamin D levels  Dural arteriovenous fistula S/p repair; monitor for recurrent sx  S/p lumbar laminectomy L4/L5 [facility]  Future Appointments  Date Time Provider DColony 11/13/2022  2:45 PM FCandyce ChurnB, PT [facility] None  11/18/2022  2:45 PM FCandyce ChurnB, PT [facility] None  11/20/2022  2:45 PM FCandyce ChurnB, PT [facility] None  11/25/2022  2:45 PM FCandyce ChurnB, PT [facility] None  02/13/2023 11:00 AM CDarrol Jump NP [facility] None  03/30/2023  2:00 PM GNicholas Lose MD [facility] None  11/12/2023  2:00 PM CDarrol Jump NP [facility] None    Subjective:   Jacqueline MARSANis a 72y.o.  female who presents for CPE. She has A[place]; Allergic rhinitis; Hyperlipidemia, mixed; Lumbar degenerative disc disease; Vitamin D deficiency; Abnormal glucose; Medication management; Essential hypertension; Breast cancer of upper-outer quadrant of left female breast ([place]; Genetic testing; BRCA2 positive; Dural arteriovenous fistula; Osteoporosis; FHx: heart disease; Aortic atherosclerosis ([place] by Abd CT scan in 2021; Statin myopathy; and BMI 26.0-26.9,adult on their problem list.  Overall she reports feeling well today.  She is s/p lumbar laminectomy L4-L5 09/2022.  Healing well.  However, has developed hip bursitis.  She is currently in PT rehabilitation.  She [facility].   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 Daughters help at night and on the weekends.   She has history of L breast lumpectomy (+ER/+PR/HER2 Neg)  in Dec 2016 followed by chemoradiation, underwent BSO due to + BRCA2 mutation, on anastrazole with plan to continue, and follows with Dr. GLindi Adie DEXA 09/08/2019 showed T score -2.5 by Dr. GLindi Adie has been on fosamax weekly without SE. She had reassuring mammogram 09/20/2021. Will have MRI in July 2023.   Saw [facility] 05/21/22 for annual exam.  Breast were well  healing.  Genitalia  included atrophy, rectocele 1st degree, mild atrophy.  Breast ca/BRCA 2 positive, s/p BSO and chemo/radiation.   She had dural AV fistula repaired by neurosurgery in 2019 without issues and was released unless recurrent issue.   BMI is Body mass index is 27.82 kg/m., she has been working on diet and exercise. Wt Readings from Last 3 Encounters:  11/12/22 160 lb 12.8 oz (72.9 kg)  07/08/22 156 lb (70.8 kg)  05/27/22 155 lb 6.4 oz (70.5 kg)   She follows with Dr. Trula Slade for large 5 cm splenic artery aneurysm,  she underwent: Embolization with coil placement in June 2013, She had CTA follow up 05/16/2021 with interval crease down to 4 cm. He is planning 3 year follow up with abd [place].  CT also incidentally showed renal cyst which has been worked up in the past and concluded benign. Liver spots felt most likely hemangiomas.    Her blood pressure has been controlled at home (120-130s/70s), today their BP is BP: (!) 148/84,  She does workout, but active caring for husband She denies chest pain, shortness of breath, dizziness. No leg swelling.  She is on cholesterol medication, hx of numerous intolerances, now on repatha via Dr. Debara Pickett, does self injections and doing well. Had cardiac calcium score with score of 8 at 50% with her age, and she had aortic atherosclerosis per abd CT 2021. Her cholesterol is at goal of LDL <100. The cholesterol last visit was:  Lab Results  Component Value Date   CHOL 171 05/27/2022   HDL 66 05/27/2022   LDLCALC 79 05/27/2022   TRIG 161 (H) 05/27/2022   CHOLHDL 2.6 05/27/2022   She has had prediabetes, controlled by lifestyle  She denies foot ulcerations, hyperglycemia, hypoglycemia , increased appetite, nausea, paresthesia of the feet, polydipsia, polyuria, visual disturbances, vomiting and weight loss.  Last A1C in the office was:  Lab Results  Component Value Date   HGBA1C 5.6 05/27/2022    Last GFR:  Lab Results  Component Value Date   GFRNONAA 83  11/05/2020   Patient is on Vitamin D supplement,  Taking 5000 IU every other day, also 1000 IU daily in multivitamin Lab Results  Component Value Date   VD25OH 1 05/27/2022     She continues to attend Outpatient Rehab/PT.     Medication Review:  Current Outpatient Medications (Endocrine & Metabolic):    dexamethasone (DECADRON) 4 MG tablet, Take 1 tab 3 x day - 3 days, then 2 x day - 3 days, then 1 tab daily  Current Outpatient Medications (Cardiovascular):    Evolocumab (REPATHA SURECLICK) [id number] MG/ML SOAJ, INJECT 1 DOSE INTO SKIN EVERY 14 DAYS   olmesartan-hydrochlorothiazide (BENICAR HCT) 40-12.5 MG tablet, TAKE 1 TAB DAILY IN THE MORNING FOR BLOOD PRESSURE.   Current Outpatient Medications (Analgesics):    naproxen sodium (ANAPROX) 220 MG tablet, Take 220 mg by mouth daily as needed (pain).   Current Outpatient Medications (Other):    anastrozole (ARIMIDEX) 1 MG tablet, Take 1 tablet (1 mg total) by mouth daily.   Calcium Carb-Cholecalciferol (CALCIUM 1000 + D PO), Take 2 tablets by mouth daily. Gummies   Multiple Vitamin (MULTIVITAMIN) tablet, Take 1 tablet by mouth daily.   VITAMIN D PO, Take 5,000 Units by mouth. Takes 1 capsule every other day.   gabapentin (NEURONTIN) 100 MG capsule, Take  1 to 3 capsules   3 x / day  for Sciatica Pain (Patient not taking: Reported on 11/12/2022)  Current  Problems (verified) Patient Active Problem List   Diagnosis Date Noted   BMI 26.0-26.9,adult 11/01/2021   Statin myopathy 08/01/2020   Aortic atherosclerosis ([place]) by Abd CT scan in 2021 07/31/2020   FHx: heart disease 12/28/2017   Osteoporosis 06/17/2017   Dural arteriovenous fistula 03/05/2017   Genetic testing 09/19/2015   BRCA2 positive 09/19/2015   Breast cancer of upper-outer quadrant of left female breast ([place]) 08/21/2015   Medication management 06/07/2015   Essential hypertension 06/07/2015   Abnormal glucose 11/16/2013   Allergic rhinitis    Hyperlipidemia, mixed     Lumbar degenerative disc disease    Vitamin D deficiency    Aneurysm of splenic artery ([place]) 01/19/2012    Allergies Allergies  Allergen Reactions   Lescol [Fluvastatin Sodium] Other (See Comments)    Myalgia   Lipitor [Atorvastatin] Other (See Comments)    Increased LFT's   Vytorin [Ezetimibe-Simvastatin] Other (See Comments)    myalgia     Immunization History  Administered Date(s) Administered   DT (Pediatric) 01/22/2015   Influenza Split 06/08/2014, 06/02/2016   Influenza, High Dose Seasonal PF 06/08/2014, 05/27/2017, 06/01/2018, 06/02/2019, 06/01/2020   Influenza-Unspecified 06/14/2013, 06/04/2015, 05/27/2017   Meningococcal Conjugate 09/02/2011   Moderna Sars-Covid-2 Vaccination 10/17/2019, 11/15/2019   PFIZER(Purple Top)SARS-COV-2 Vaccination 06/27/2020, 01/22/2021   PPD Test 01/18/2014   Pneumococcal Conjugate-13 12/31/2011, 06/17/2017   Pneumococcal Polysaccharide-23 09/02/2011   Pneumococcal-Unspecified 09/22/1995, 09/02/2011   Td 11/27/1994, 04/29/2004, 01/22/2015   Health Maintenance  Topic Date Due   Zoster Vaccines- Shingrix (1 of 2) Never done   PAP SMEAR-Modifier  03/01/2021   COLONOSCOPY (Pts 45-29yr Insurance coverage will need to be confirmed)  01/14/2022   INFLUENZA VACCINE  04/01/2022   COVID-19 Vaccine (7 - 2023-24 season) 08/29/2022   Medicare Annual Wellness (AWV)  02/13/2023   MAMMOGRAM  09/24/2023   DEXA SCAN  09/23/2024   DTaP/Tdap/Td (5 - Tdap) 01/21/2025   Hepatitis C Screening  Completed   HPV VACCINES  Aged Out   Pneumonia Vaccine 72 Years old  Discontinued   Shingrix: discussed, cost, check with insurance with new year policy   Preventative care: Last colonoscopy: 12/2011, never abnormal; Cologuard 10/2021 Negative  DEXA 09/2019- osteoporosis -2.5- on fosamax - Dr. GLindi Adieis managing, encouraged to discuss MGM: annually at breast center, Dr. GLindi Adiefollows MRI breast annually alternates with mmg due to lifetime elevated risk of  breast cancer  PAP q2y due to elevated risk, last 03/2019 with GYN, optional from here on out was advised per patient, she will call to discuss.  Follows Dr. RMarvel PlanGYN   Names of Other Physician/Practitioners you currently use: 1. [facility] here for primary care 2. Dr. BValetta Close eye doctor, last visit 2023. q 6 months, wears glasses  3. Dr. DJason [facility] dentist, last visit 2023, has scheduled 4. Dr. RHardie Shackletondid dental implant 5. Dr. Amy JMartinique derm, last 10/2021, has scheduled    SURGICAL HISTORY She  has a past surgical history that includes Tonsillectomy (1968); Spine surgery (1998); Spine surgery (2001, 2010); Cervical disc surgery (1998); Lumbar disc surgery (2001, 2010); splenic aneurysm (02/10/2012); Colonoscopy (Jan. 7, 2014); visceral angiogram (N/A, 02/10/2012); Breast lumpectomy with radioactive seed and sentinel lymph node biopsy (Left, 08/29/2015); Re-excision of breast lumpectomy (Left, 09/07/2015); Portacath placement (Bilateral, 10/09/2015); Portacath removal; Laparoscopic bilateral salpingo oophorectomy (Bilateral, 05/16/2016); IR ANGIO INTRA EXTRACRAN SEL INTERNAL CAROTID BILAT MOD SED (02/03/2017); IR NEURO EACH ADD'L AFTER BASIC UNI RIGHT (MS) (02/03/2017); IR ANGIO VERTEBRAL SEL VERTEBRAL BILAT  MOD SED (02/03/2017); IR ANGIO EXTERNAL CAROTID SEL EXT CAROTID BILAT MOD SED (02/03/2017); Radiology with anesthesia (N/A, 03/05/2017); IR NEURO EACH ADD'L AFTER BASIC UNI RIGHT (MS) (03/05/2017); IR Angiogram Follow Up Study (03/05/2017); IR Transcath/Emboliz (03/05/2017); IR ANGIO INTRA EXTRACRAN SEL COM CAROTID INNOMINATE UNI R MOD SED (03/05/2017); IR ANGIO EXTERNAL CAROTID SEL EXT CAROTID UNI R MOD SED (03/05/2017); Breast lumpectomy (Left, 2016); and dural AV fistula (03/2018).   FAMILY HISTORY Her family history includes Breast cancer in her cousin and maternal grandmother; Colon cancer in her maternal uncle; Deep vein thrombosis in her mother; Diverticulitis in her  mother; Heart Problems in her paternal grandfather; Heart attack in her father, paternal aunt, paternal grandmother, and paternal uncle; Heart defect in her maternal aunt; Hyperlipidemia in her mother; Hypertension in her father and mother; Leukemia in her maternal aunt; Lung cancer in her maternal uncle; Osteoporosis in her mother; Other in her father, mother, and sister; Pancreatic cancer in her maternal uncle; Stroke in her father, maternal grandfather, mother, paternal aunt, paternal grandmother, and paternal uncle; Vasculitis in her sister.   SOCIAL HISTORY She  reports that she has never smoked. She has never used smokeless tobacco. She reports that she does not currently use alcohol. She reports that she does not use drugs.   Review of Systems  Constitutional:  Negative for malaise/fatigue and weight loss.  HENT:  Negative for hearing loss and tinnitus.   Eyes:  Negative for blurred vision and double vision.  Respiratory:  Negative for cough, shortness of breath and wheezing.   Cardiovascular:  Negative for chest pain, palpitations, orthopnea, claudication and leg swelling.  Gastrointestinal:  Negative for abdominal pain, blood in stool, constipation, diarrhea, heartburn, melena, nausea and vomiting.  Genitourinary: Negative.   Musculoskeletal:  Negative for joint pain and myalgias.  Skin:  Negative for rash.  Neurological:  Negative for dizziness, tingling, sensory change, weakness and headaches.  Endo/Heme/Allergies:  Negative for polydipsia.  Psychiatric/Behavioral: Negative.    All other systems reviewed and are negative.    Objective:     BP (!) 148/84   Pulse 71   Temp 97.7 F (36.5 C)   Ht 5' 3.75" (1.619 m)   Wt 160 lb 12.8 oz (72.9 kg)   SpO2 99%   BMI 27.82 kg/m   General Appearance: Well nourished, alert, WD/WN, female and in no apparent distress. Eyes: PERRLA, EOMs, conjunctiva no swelling or erythema Sinuses: No frontal/maxillary tenderness ENT/Mouth: EACs  patent / TMs  nl. Nares clear without erythema, swelling, mucoid exudates. Oral hygiene is good. No erythema, swelling, or exudate. Tongue normal, non-obstructing. Tonsils not swollen or erythematous. Hearing normal.  Neck: Supple, thyroid normal. No bruits, nodes or JVD. Respiratory: Respiratory effort normal.  BS equal and clear bilateral without rales, rhonci, wheezing or stridor. Cardio: Heart sounds are normal with regular rate and rhythm and no murmurs, rubs or gallops. Peripheral pulses are normal and equal bilaterally without edema. No aortic or femoral bruits. Abdomen: Flat, soft  with nl bowel sounds. Nontender, no guarding, rebound, hernias, masses, or organomegaly.  Lymphatics: Non tender without lymphadenopathy.  Musculoskeletal: Full ROM all peripheral extremities, no deformity.  Skin: Warm and dry; without concerning lesions, cyanosis, clubbing or  ecchymosis. Multiple seborrheic keratosis across the chest and the abdomen. She has mild erythematous/petichial rash to L inner ankle, approx 4 cm area, improving per comparison to photo from yesterday. No heat, tenderness, distinct lesions.  Neuro: Cranial nerves intact, reflexes equal bilaterally. Normal muscle tone, no cerebellar  symptoms. Sensation intact.  Pysch: Alert and oriented X 3, normal affect, Insight and Judgment appropriate.   EKG: NSR, No ST changes   Andra Heslin, NP   11/12/2022

## 2022-11-12 NOTE — Patient Instructions (Signed)

## 2022-11-13 ENCOUNTER — Ambulatory Visit: Payer: Medicare Other

## 2022-11-13 DIAGNOSIS — R293 Abnormal posture: Secondary | ICD-10-CM | POA: Diagnosis not present

## 2022-11-13 DIAGNOSIS — R262 Difficulty in walking, not elsewhere classified: Secondary | ICD-10-CM

## 2022-11-13 DIAGNOSIS — M25551 Pain in right hip: Secondary | ICD-10-CM | POA: Diagnosis not present

## 2022-11-13 DIAGNOSIS — M5416 Radiculopathy, lumbar region: Secondary | ICD-10-CM | POA: Diagnosis not present

## 2022-11-13 DIAGNOSIS — R252 Cramp and spasm: Secondary | ICD-10-CM | POA: Diagnosis not present

## 2022-11-13 DIAGNOSIS — M6281 Muscle weakness (generalized): Secondary | ICD-10-CM | POA: Diagnosis not present

## 2022-11-13 LAB — COMPLETE METABOLIC PANEL WITH GFR
AG Ratio: 1.7 (calc) (ref 1.0–2.5)
ALT: 22 U/L (ref 6–29)
AST: 25 U/L (ref 10–35)
Albumin: 4.6 g/dL (ref 3.6–5.1)
Alkaline phosphatase (APISO): 64 U/L (ref 37–153)
BUN: 21 mg/dL (ref 7–25)
CO2: 25 mmol/L (ref 20–32)
Calcium: 9.9 mg/dL (ref 8.6–10.4)
Chloride: 98 mmol/L (ref 98–110)
Creat: 0.73 mg/dL (ref 0.60–1.00)
Globulin: 2.7 g/dL (calc) (ref 1.9–3.7)
Glucose, Bld: 84 mg/dL (ref 65–99)
Potassium: 4.5 mmol/L (ref 3.5–5.3)
Sodium: 134 mmol/L — ABNORMAL LOW (ref 135–146)
Total Bilirubin: 0.4 mg/dL (ref 0.2–1.2)
Total Protein: 7.3 g/dL (ref 6.1–8.1)
eGFR: 87 mL/min/{1.73_m2} (ref >=60)

## 2022-11-13 LAB — CBC WITH DIFFERENTIAL/PLATELET
Absolute Monocytes: 493 cells/uL (ref 200–950)
Basophils Absolute: 102 cells/uL (ref 0–200)
Basophils Relative: 1.6 %
Eosinophils Absolute: 314 cells/uL (ref 15–500)
Eosinophils Relative: 4.9 %
HCT: 38.1 % (ref 35.0–45.0)
Hemoglobin: 12.7 g/dL (ref 11.7–15.5)
Lymphs Abs: 1133 cells/uL (ref 850–3900)
MCH: 31.1 pg (ref 27.0–33.0)
MCHC: 33.3 g/dL (ref 32.0–36.0)
MCV: 93.2 fL (ref 80.0–100.0)
MPV: 9.8 fL (ref 7.5–12.5)
Monocytes Relative: 7.7 %
Neutro Abs: 4358 cells/uL (ref 1500–7800)
Neutrophils Relative %: 68.1 %
Platelets: 339 10*3/uL (ref 140–400)
RBC: 4.09 10*6/uL (ref 3.80–5.10)
RDW: 11.9 % (ref 11.0–15.0)
Total Lymphocyte: 17.7 %
WBC: 6.4 10*3/uL (ref 3.8–10.8)

## 2022-11-13 LAB — URINALYSIS, ROUTINE W REFLEX MICROSCOPIC
Bilirubin Urine: NEGATIVE
Glucose, UA: NEGATIVE
Hgb urine dipstick: NEGATIVE
Ketones, ur: NEGATIVE
Leukocytes,Ua: NEGATIVE
Nitrite: NEGATIVE
Protein, ur: NEGATIVE
Specific Gravity, Urine: 1.018 (ref 1.001–1.035)
pH: 6.5 (ref 5.0–8.0)

## 2022-11-13 LAB — LIPID PANEL
Cholesterol: 182 mg/dL (ref <200)
HDL: 74 mg/dL (ref >=50)
LDL Cholesterol (Calc): 88 mg/dL (calc)
Non-HDL Cholesterol (Calc): 108 mg/dL (calc) (ref <130)
Total CHOL/HDL Ratio: 2.5 (calc) (ref <5.0)
Triglycerides: 102 mg/dL (ref <150)

## 2022-11-13 LAB — MICROALBUMIN / CREATININE URINE RATIO
Creatinine, Urine: 82 mg/dL (ref 20–275)
Microalb Creat Ratio: 6 mcg/mg creat (ref <30)
Microalb, Ur: 0.5 mg/dL

## 2022-11-13 LAB — HEMOGLOBIN A1C
Hgb A1c MFr Bld: 5.7 % of total Hgb — ABNORMAL HIGH (ref <5.7)
Mean Plasma Glucose: 117 mg/dL
eAG (mmol/L): 6.5 mmol/L

## 2022-11-13 LAB — INSULIN, RANDOM: Insulin: 15.1 u[IU]/mL

## 2022-11-13 LAB — TSH: TSH: 1.12 mIU/L (ref 0.40–4.50)

## 2022-11-13 LAB — MAGNESIUM: Magnesium: 1.9 mg/dL (ref 1.5–2.5)

## 2022-11-13 LAB — VITAMIN D 25 HYDROXY (VIT D DEFICIENCY, FRACTURES): Vit D, 25-Hydroxy: 55 ng/mL (ref 30–100)

## 2022-11-13 NOTE — Therapy (Signed)
OUTPATIENT PHYSICAL THERAPY THORACOLUMBAR TREATMENT NOTE   Patient Name: Jacqueline Jenkins MRN: [id number] DOB:[birth year], 72 y.o., female Today's Date: 11/13/2022  END OF SESSION:  PT End of Session - 11/13/22 1452     Visit Number 7    Date for PT Re-Evaluation 11/26/22    Authorization Type MEDICARE PART A AND B    PT Start Time 1450    PT Stop Time 1530    PT Time Calculation (min) 40 min    Activity Tolerance Patient tolerated treatment well    Behavior During Therapy WFL for tasks assessed/performed              Past Medical History:  Diagnosis Date   Allergic rhinitis, cause unspecified    Anemia    history of anemia while teenager   BRCA2 positive    Breast cancer ([place]) 2016   Left Breast Cancer   Breast cancer of upper-outer quadrant of left female breast ([place])    chemo complete 01/2016, radiation 04/2016   DDD (degenerative disc disease)    Diverticulitis    Family history of breast cancer 08/30/2015   Dx. In maternal grandmother in her 40s-40; dx. In maternal first cousin in her 23s-60s    Family history of colon cancer 08/30/2015   Dx. In maternal uncle in his 50s    Hyperlipidemia    Hypertension    Left kidney mass 04/06/2018   Mild growth from Ct 2013 to 04/06/2018; Renal US 2020 no growth- will not follow up   Neuropathy    Osteopenia 12/2011   Spine T -0.4, Femur -2.1   Personal history of chemotherapy 2016   Left Breast Cancer   Personal history of radiation therapy 2016   Left Breast Cancer   PONV (postoperative nausea and vomiting)    Splenic artery aneurysm ([place]) 2013   COILS DONE   Vitamin D deficiency    Past Surgical History:  Procedure Laterality Date   BREAST LUMPECTOMY Left 2016   BREAST LUMPECTOMY WITH RADIOACTIVE SEED AND SENTINEL LYMPH NODE BIOPSY Left 08/29/2015   Procedure: LEFT BREAST LUMPECTOMY WITH RADIOACTIVE SEED WITH AXILLARY SENTINEL LYMPH NODE BIOPSY;  Surgeon: Excell Seltzer, MD;  Location: [facility];   Service: General;  Laterality: Left;   [place]   COLONOSCOPY  Jan. 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   Dural AV fistula repair   IR ANGIO EXTERNAL CAROTID SEL EXT CAROTID BILAT MOD SED  02/03/2017   IR ANGIO EXTERNAL CAROTID SEL EXT CAROTID UNI R MOD SED  03/05/2017   IR ANGIO INTRA EXTRACRAN SEL COM CAROTID INNOMINATE UNI R MOD SED  03/05/2017   IR ANGIO INTRA EXTRACRAN SEL INTERNAL CAROTID BILAT MOD SED  02/03/2017   IR ANGIO VERTEBRAL SEL VERTEBRAL BILAT MOD SED  02/03/2017   IR ANGIOGRAM FOLLOW UP STUDY  03/05/2017   IR NEURO EACH ADD'L AFTER BASIC UNI RIGHT (MS)  02/03/2017   IR NEURO EACH ADD'L AFTER BASIC UNI RIGHT (MS)  03/05/2017   IR TRANSCATH/EMBOLIZ  03/05/2017   LAPAROSCOPIC BILATERAL SALPINGO OOPHERECTOMY Bilateral 05/16/2016   Procedure: LAPAROSCOPIC BILATERAL SALPINGO OOPHORECTOMY;  Surgeon: Paula Compton, MD;  Location: [facility];  Service: Gynecology;  Laterality: Bilateral;   LUMBAR DISC SURGERY  2001, 2010   PORTACATH PLACEMENT Bilateral 10/09/2015   Procedure: INSERTION PORT-A-CATH;  Surgeon: Excell Seltzer, MD;  Location: [facility];  Service: General;  Laterality: Bilateral;   Portacath removal     RADIOLOGY  WITH ANESTHESIA N/A 03/05/2017   Procedure: ARTERIOGRAM ONYX EMBOLIZATION OF FISTULA;  Surgeon: Consuella Lose, MD;  Location: [facility];  Service: Radiology;  Laterality: N/A;   RE-EXCISION OF BREAST LUMPECTOMY Left 09/07/2015   Procedure: RE-EXCISION OF LEFT BREAST LUMPECTOMY;  Surgeon: Excell Seltzer, MD;  Location: [facility];  Service: General;  Laterality: Left;   [place]   cervical diskectomy   SPINE SURGERY  2001, 2010   Lumbar diskectomy   splenic aneurysm  02/10/2012   Aneurysm of splenic artery, coil placed, Dr. 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   Genetic testing 09/19/2015   BRCA2 positive 09/19/2015   Breast cancer of upper-outer quadrant of left female breast ([place]) 08/21/2015   Medication management 06/07/2015   Essential hypertension 06/07/2015   Abnormal glucose 11/16/2013   Allergic rhinitis    Hyperlipidemia, mixed    Lumbar degenerative disc disease    Vitamin D deficiency    Aneurysm of splenic artery ([place]) 01/19/2012    PCP: Unk Pinto, MD  REFERRING PROVIDER: Kristeen Miss, MD  REFERRING DIAG: M54.16 (ICD-10-CM) - Radiculopathy, lumbar region  Rationale for Evaluation and Treatment: Rehabilitation  THERAPY DIAG:  Radiculopathy, lumbar region  Pain in right hip  Difficulty in walking, not elsewhere classified  Cramp and spasm  Muscle weakness (generalized)  Abnormal posture  ONSET DATE: 09/30/2022  SUBJECTIVE:                                                                                                                                                                                           SUBJECTIVE STATEMENT: Pt states that she is approx 25% better.  She feels the pain is less frequent.  She sees Dr. Ellene Route tomorrow for f/u.          PERTINENT HISTORY:  Lumbar laminectomy 09-02-22 (3rd lumbar surgery)  PAIN:  Are you having pain? Yes: NPRS scale: 0/10 Pain location: most pain is post rest and start up pain; right hip Pain description: sore muscle thigh, and some pain in groin, describes a disconnected kind of feeling Aggravating factors: prolonged sitting Relieving factors: heat  PRECAUTIONS: Back  WEIGHT BEARING RESTRICTIONS: No  FALLS:  Has patient fallen in last 6 months? No  LIVING ENVIRONMENT: Lives  with: lives with their spouse Lives in: House/apartment  OCCUPATION: Retired  PLOF: Independent, Independent with basic ADLs, Independent with household mobility without device, Independent with community mobility without device, Independent with homemaking with ambulation, Independent with gait, and Independent with transfers  PATIENT GOALS: to eliminate hip pain so that she can properly recover from her back surgery  NEXT MD VISIT:   OBJECTIVE:   DIAGNOSTIC FINDINGS: 07/11/22 IMPRESSION: 1. Lumbar spondylosis and degenerative disc disease, causing prominent impingement at L3-4; moderate impingement at L4-5 and L5-S1; and 3 mm degenerative anterolisthesis at L4-5 and L5-S1. 2. The dominant finding on today's exam is the right lateral recess and foraminal disc protrusion at L3-4 contributing substantially to the prominent right central and subarticular lateral recess stenosis at this level.  PATIENT SURVEYS:  FOTO 41 (predicted 55)  SCREENING FOR RED FLAGS: Bowel or bladder incontinence: No Spinal tumors: No Cauda equina syndrome: No Compression fracture: No Abdominal aneurysm: No  COGNITION: Overall cognitive status: Within functional limits for tasks assessed     SENSATION: Mild L3-L4 radicular symptoms linger in the right thigh  MUSCLE LENGTH: Hamstrings: Right 50 deg; Left 60 deg Thomas test: Right pos ; Left pos   POSTURE: decreased lumbar lordosis and posterior pelvic tilt  LUMBAR ROM:  Deferred due to 4 weeks post op: complete this at 8 weeks AROM eval  Flexion   Extension   Right lateral flexion   Left lateral flexion   Right rotation   Left rotation    (Blank rows = not tested)  LOWER EXTREMITY ROM:     WFL  LOWER EXTREMITY MMT:    Generally 4/5 right LE and 4+/5 left LE LUMBAR SPECIAL TESTS:  See diagnostic findings  FUNCTIONAL TESTS:  5 times sit to stand: 20.10 sec Timed up and go (TUG): 12.01 sec  GAIT: Distance walked: 30 Assistive  device utilized: None Level of assistance: Modified independence Comments: antalgic  TODAY'S TREATMENT:    DATE: 11/13/22 Nustep x 5 min Level 1 Lateral band walks at counter with blue band x 3 laps at counter Sit to stand x 10 with 5 lb kb Squats x 10 with 5 lb kb  Supine clam with red loop x 20 Supine bridge with clam with red loop 2 x 10 Side lying clam with red loop x 20 each side Side lying hip abduction x 20 each side  PPT x 20 PPT with 90/90 heel tap x 20 PPT with dying bug x 20 Star crunch modified due to DAR x 20  DATE: 11/11/22 Nustep x 5 min Level 1 Lateral band walks at counter with blue band x 3 laps at counter Standing hamstring stretch at steps 3 x 30 sec each LE Standing hip flexor/quad stretch 3 x 30 sec each LE Sit to stand x 5 Squats x 5  Supine clam with red loop x 20 Supine bridge with clam with red loop 2 x 10 Side lying clam with red loop x 20 each side Side lying hip abduction x 20 each side   DATE: 10/29/22 Nustep x 5 min Level 1 Standing hamstring stretch at steps 3 x 30 sec each LE Standing hip flexor/quad stretch 3 x 30 sec each LE Supine clam with red loop x 20 Side lying clam with red loop x 20 each side Side lying hip abduction x 20 each side Supine bridge x 20 Supine bridge with clam  with green band 2 x 10 Lateral band walks at counter with green band x 3 laps at counter Issued updated HEP to include hip strengthening                                                          PATIENT EDUCATION:  Education details: Initiated HEP and educated on anatomy of the hip including labrum and possibility of labral involvement in the hip Person educated: Patient Education method: Consulting civil engineer, Demonstration, Verbal cues, and Handouts Education comprehension: verbalized understanding, returned demonstration, and verbal cues required  HOME EXERCISE PROGRAM: Access Code: [email or web address] URL: [email or web address] Date: 10/29/2022 Prepared  by: Candyce Churn  Exercises - Supine Hamstring Stretch with Strap  - 1 x daily - 7 x weekly - 1 sets - 3 reps - 30 hold - Supine ITB Stretch with Strap  - 1 x daily - 7 x weekly - 1 sets - 3 reps - 30 hold - Supine Figure 4 Piriformis Stretch  - 1 x daily - 7 x weekly - 1 sets - 3 reps - 30 hold - Quadricep Stretch with Chair and Counter Support  - 1 x daily - 7 x weekly - 1 sets - 3 reps - 30 sec hold - Supine Butterfly Groin Stretch  - 1 x daily - 7 x weekly - 1 sets - 5 reps - 30 sec hold - Supine Posterior Pelvic Tilt  - 1 x daily - 7 x weekly - 2 sets - 10 reps - Supine Bridge  - 1 x daily - 7 x weekly - 2 sets - 10 reps - Hooklying Clamshell with Resistance  - 1 x daily - 7 x weekly - 2 sets - 10 reps - Clamshell with Resistance  - 1 x daily - 7 x weekly - 2 sets - 10 reps - Sidelying Hip Abduction  - 1 x daily - 7 x weekly - 2 sets - 10 reps - Side Stepping with Resistance at Ankles  - 1 x daily - 7 x weekly - 3 sets - 10 reps  ASSESSMENT:  CLINICAL IMPRESSION: [place] is progressing appropriately.   She was able to tolerate addition of higher level core work without increased pain but does have a fairly prominent DAR.  We discussed some modifications and precautions for her core work to avoid DAR complications or worsening.  She would benefit from continuing skilled PT for bilateral LE stretching R>L along with post laminectomy protocol.    OBJECTIVE IMPAIRMENTS: decreased mobility, difficulty walking, decreased ROM, decreased strength, increased fascial restrictions, increased muscle spasms, impaired flexibility, impaired sensation, improper body mechanics, and pain.   ACTIVITY LIMITATIONS: carrying, lifting, bending, sitting, standing, squatting, sleeping, stairs, transfers, bed mobility, bathing, dressing, and hygiene/grooming  PARTICIPATION LIMITATIONS: meal prep, cleaning, laundry, interpersonal relationship, driving, shopping, community activity, yard work, and  church  PERSONAL FACTORS: Fitness and 1-2 comorbidities: HTN, Hx breast cx  are also affecting patient's functional outcome.   REHAB POTENTIAL: Fair Patient has multiple orthopedic spine issues over a 20 year time period.  She must care for her disabled spouse but does have help now.   CLINICAL DECISION MAKING: Evolving/moderate complexity  EVALUATION COMPLEXITY: Moderate   GOALS: Goals reviewed with patient? Yes  SHORT TERM GOALS: Target date: 10/29/2022    Pain report to be  no greater than 4/10  Baseline: Goal status: IN PROGRESS  2.  Patient will be independent with initial HEP  Baseline:  Goal status: MET   LONG TERM GOALS: Target date: 11/26/2022    Patient to report pain no greater than 2/10  Baseline:  Goal status: INITIAL  2.  Patient to be independent with advanced HEP  Baseline:  Goal status: INITIAL  3.  Radicular symptoms to be centralized Baseline:  Goal status: INITIAL  4.  Patient to be able to sleep through the night  Baseline:  Goal status: INITIAL  5.  Patient to report 85% improvement in overall symptoms  Baseline:  Goal status: INITIAL  6.  FOTO to improve to 61  and functional tests to improve by 2-3 seconds Baseline: FOTO 41 on eval  5 times sit to stand: 20.10 sec Timed up and go (TUG): 12.01 sec Goal status: INITIAL  PLAN:  PT FREQUENCY: 2x/week  PT DURATION: 8 weeks  PLANNED INTERVENTIONS: Therapeutic exercises, Therapeutic activity, Neuromuscular re-education, Balance training, Gait training, Patient/Family education, Self Care, Joint mobilization, Stair training, DME instructions, Aquatic Therapy, Dry Needling, Electrical stimulation, Spinal mobilization, Cryotherapy, Moist heat, Taping, Traction, Ultrasound, Ionotophoresis '4mg'$ /ml Dexamethasone, Manual therapy, and Re-evaluation.  PLAN FOR NEXT SESSION: Nustep, review and update HEP, add further LE stretching and Right hip strengthening along with left gluteus medius  strengthening  Nicoletta Hush B. Michaeleen Down, PT 11/13/22 5:30 PM  [facility] [address], [place], [place] [address] Phone # [phone] Fax [phone]

## 2022-11-18 ENCOUNTER — Ambulatory Visit: Payer: Medicare Other

## 2022-11-18 DIAGNOSIS — M5416 Radiculopathy, lumbar region: Secondary | ICD-10-CM

## 2022-11-18 DIAGNOSIS — R252 Cramp and spasm: Secondary | ICD-10-CM | POA: Diagnosis not present

## 2022-11-18 DIAGNOSIS — R262 Difficulty in walking, not elsewhere classified: Secondary | ICD-10-CM

## 2022-11-18 DIAGNOSIS — M25551 Pain in right hip: Secondary | ICD-10-CM | POA: Diagnosis not present

## 2022-11-18 DIAGNOSIS — R293 Abnormal posture: Secondary | ICD-10-CM | POA: Diagnosis not present

## 2022-11-18 DIAGNOSIS — M6281 Muscle weakness (generalized): Secondary | ICD-10-CM | POA: Diagnosis not present

## 2022-11-18 NOTE — Therapy (Signed)
OUTPATIENT PHYSICAL THERAPY THORACOLUMBAR TREATMENT NOTE   Patient Name: Jacqueline Jenkins MRN: [id number] DOB:1951/05/31, 72 y.o., female Today's Date: 11/18/2022  END OF SESSION:  PT End of Session - 11/18/22 1447     Visit Number 8    Date for PT Re-Evaluation 11/26/22    Authorization Type MEDICARE PART A AND B    PT Start Time 1447    PT Stop Time 1530    PT Time Calculation (min) 43 min    Activity Tolerance Patient tolerated treatment well    Behavior During Therapy WFL for tasks assessed/performed              Past Medical History:  Diagnosis Date   Allergic rhinitis, cause unspecified    Anemia    history of anemia while teenager   BRCA2 positive    Breast cancer ([place]) 2016   Left Breast Cancer   Breast cancer of upper-outer quadrant of left female breast ([place])    chemo complete 01/2016, radiation 04/2016   DDD (degenerative disc disease)    Diverticulitis    Family history of breast cancer 08/30/2015   Dx. In maternal grandmother in her 15s-40; dx. In maternal first cousin in her 65s-60s    Family history of colon cancer 08/30/2015   Dx. In maternal uncle in his 19s    Hyperlipidemia    Hypertension    Left kidney mass 04/06/2018   Mild growth from Ct 2013 to 04/06/2018; Renal US 2020 no growth- will not follow up   Neuropathy    Osteopenia 12/2011   Spine T -0.4, Femur -2.1   Personal history of chemotherapy 2016   Left Breast Cancer   Personal history of radiation therapy 2016   Left Breast Cancer   PONV (postoperative nausea and vomiting)    Splenic artery aneurysm ([place]) 2013   COILS DONE   Vitamin D deficiency    Past Surgical History:  Procedure Laterality Date   BREAST LUMPECTOMY Left 2016   BREAST LUMPECTOMY WITH RADIOACTIVE SEED AND SENTINEL LYMPH NODE BIOPSY Left 08/29/2015   Procedure: LEFT BREAST LUMPECTOMY WITH RADIOACTIVE SEED WITH AXILLARY SENTINEL LYMPH NODE BIOPSY;  Surgeon: Excell Seltzer, MD;  Location: [facility];   Service: General;  Laterality: Left;   [place]   COLONOSCOPY  Jan. 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   Dural AV fistula repair   IR ANGIO EXTERNAL CAROTID SEL EXT CAROTID BILAT MOD SED  02/03/2017   IR ANGIO EXTERNAL CAROTID SEL EXT CAROTID UNI R MOD SED  03/05/2017   IR ANGIO INTRA EXTRACRAN SEL COM CAROTID INNOMINATE UNI R MOD SED  03/05/2017   IR ANGIO INTRA EXTRACRAN SEL INTERNAL CAROTID BILAT MOD SED  02/03/2017   IR ANGIO VERTEBRAL SEL VERTEBRAL BILAT MOD SED  02/03/2017   IR ANGIOGRAM FOLLOW UP STUDY  03/05/2017   IR NEURO EACH ADD'L AFTER BASIC UNI RIGHT (MS)  02/03/2017   IR NEURO EACH ADD'L AFTER BASIC UNI RIGHT (MS)  03/05/2017   IR TRANSCATH/EMBOLIZ  03/05/2017   LAPAROSCOPIC BILATERAL SALPINGO OOPHERECTOMY Bilateral 05/16/2016   Procedure: LAPAROSCOPIC BILATERAL SALPINGO OOPHORECTOMY;  Surgeon: Paula Compton, MD;  Location: [facility];  Service: Gynecology;  Laterality: Bilateral;   LUMBAR DISC SURGERY  2001, 2010   PORTACATH PLACEMENT Bilateral 10/09/2015   Procedure: INSERTION PORT-A-CATH;  Surgeon: Excell Seltzer, MD;  Location: [facility];  Service: General;  Laterality: Bilateral;   Portacath removal     RADIOLOGY  WITH ANESTHESIA N/A 03/05/2017   Procedure: ARTERIOGRAM ONYX EMBOLIZATION OF FISTULA;  Surgeon: Consuella Lose, MD;  Location: [facility];  Service: Radiology;  Laterality: N/A;   RE-EXCISION OF BREAST LUMPECTOMY Left 09/07/2015   Procedure: RE-EXCISION OF LEFT BREAST LUMPECTOMY;  Surgeon: Excell Seltzer, MD;  Location: [facility];  Service: General;  Laterality: Left;   [place]   cervical diskectomy   SPINE SURGERY  2001, 2010   Lumbar diskectomy   splenic aneurysm  02/10/2012   Aneurysm of splenic artery, coil placed, Dr. 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   Genetic testing 09/19/2015   BRCA2 positive 09/19/2015   Breast cancer of upper-outer quadrant of left female breast ([place]) 08/21/2015   Medication management 06/07/2015   Essential hypertension 06/07/2015   Abnormal glucose 11/16/2013   Allergic rhinitis    Hyperlipidemia, mixed    Lumbar degenerative disc disease    Vitamin D deficiency    Aneurysm of splenic artery ([place]) 01/19/2012    PCP: Unk Pinto, MD  REFERRING PROVIDER: Kristeen Miss, MD  REFERRING DIAG: M54.16 (ICD-10-CM) - Radiculopathy, lumbar region  Rationale for Evaluation and Treatment: Rehabilitation  THERAPY DIAG:  Radiculopathy, lumbar region  Pain in right hip  Difficulty in walking, not elsewhere classified  Cramp and spasm  Muscle weakness (generalized)  Abnormal posture  ONSET DATE: 09/30/2022  SUBJECTIVE:                                                                                                                                                                                           SUBJECTIVE STATEMENT: Pt states that the pain continues to be less frequent.  When it does flare up, it seems to be just as severe but it just doesn't happen as often.           PERTINENT HISTORY:  Lumbar laminectomy 09-02-22 (3rd lumbar surgery)  PAIN:  Are you having pain? Yes: NPRS scale: 0/10 Pain location: most pain is post rest and start up pain; right hip Pain description: sore muscle thigh, and some pain in groin, describes a disconnected kind of feeling Aggravating factors: prolonged sitting Relieving factors: heat  PRECAUTIONS:  Back  WEIGHT BEARING RESTRICTIONS: No  FALLS:  Has patient fallen in last 6 months?  No  LIVING ENVIRONMENT: Lives with: lives with their spouse Lives in: House/apartment  OCCUPATION: Retired  PLOF: Independent, Independent with basic ADLs, Independent with household mobility without device, Independent with community mobility without device, Independent with homemaking with ambulation, Independent with gait, and Independent with transfers  PATIENT GOALS: to eliminate hip pain so that she can properly recover from her back surgery  NEXT MD VISIT:   OBJECTIVE:   DIAGNOSTIC FINDINGS: 07/11/22 IMPRESSION: 1. Lumbar spondylosis and degenerative disc disease, causing prominent impingement at L3-4; moderate impingement at L4-5 and L5-S1; and 3 mm degenerative anterolisthesis at L4-5 and L5-S1. 2. The dominant finding on today's exam is the right lateral recess and foraminal disc protrusion at L3-4 contributing substantially to the prominent right central and subarticular lateral recess stenosis at this level.  PATIENT SURVEYS:  FOTO 41 (predicted 81)  SCREENING FOR RED FLAGS: Bowel or bladder incontinence: No Spinal tumors: No Cauda equina syndrome: No Compression fracture: No Abdominal aneurysm: No  COGNITION: Overall cognitive status: Within functional limits for tasks assessed     SENSATION: Mild L3-L4 radicular symptoms linger in the right thigh  MUSCLE LENGTH: Hamstrings: Right 50 deg; Left 60 deg Thomas test: Right pos ; Left pos   POSTURE: decreased lumbar lordosis and posterior pelvic tilt  LUMBAR ROM:  Deferred due to 4 weeks post op: complete this at 8 weeks AROM eval  Flexion   Extension   Right lateral flexion   Left lateral flexion   Right rotation   Left rotation    (Blank rows = not tested)  LOWER EXTREMITY ROM:     WFL  LOWER EXTREMITY MMT:    Generally 4/5 right LE and 4+/5 left LE LUMBAR SPECIAL TESTS:  See diagnostic findings  FUNCTIONAL TESTS:  5 times sit to stand: 20.10 sec Timed up and go (TUG): 12.01  sec  GAIT: Distance walked: 30 Assistive device utilized: None Level of assistance: Modified independence Comments: antalgic  TODAY'S TREATMENT:    DATE: 11/18/22 Nustep x 5 min Level 5 (time ended up being 6 min due to patient wanted to continue until she completed 1 lap) Lateral band walks at barre with blue band x 3 laps at counter Cone touches 3 x 10 each LE (floor), cone on rolling stool (about chair height) Standing alternating cone tap ups x 20 Standing hip abduction and extension with 5 lb ankle weights  20 each LE of each exercise Supine clam with blue loop x 20 Supine bridge with clam with blue loop 2 x 10 Side lying clam with blue loop x 20 each side Side lying hip abduction x 20 each side  PPT x 20 PPT with 90/90 heel tap x 20 PPT with dying bug x 20 Attempted PPT with ball pass (patient fatigued and had difficulty getting the technique down)    DATE: 11/13/22 Nustep x 5 min Level 1 Lateral band walks at counter with blue band x 3 laps at counter Sit to stand x 10 with 5 lb kb Squats x 10 with 5 lb kb  Supine clam with red loop x 20 Supine bridge with clam with red loop 2 x 10 Side lying clam with red loop x 20 each side Side lying hip abduction x 20 each side  PPT x 20 PPT with 90/90 heel tap x 20 PPT with dying bug x 20 Star crunch modified due to [employer]  x 20  DATE: 11/11/22 Nustep x 5 min Level 1 Lateral band walks at counter with blue band x 3 laps at counter Standing hamstring stretch at steps 3 x 30 sec each LE Standing hip flexor/quad stretch 3 x 30 sec each LE Sit to stand x 5 Squats x 5  Supine clam with red loop x 20 Supine bridge with clam with red loop 2 x 10 Side lying clam with red loop x 20 each side Side lying hip abduction x 20 each side                                PATIENT EDUCATION:  Education details: Initiated HEP and educated on anatomy of the hip including labrum and possibility of labral involvement in the hip Person educated:  Patient Education method: Consulting civil engineer, Media planner, Verbal cues, and Handouts Education comprehension: verbalized understanding, returned demonstration, and verbal cues required  HOME EXERCISE PROGRAM: Access Code: [email or web address] URL: [email or web address] Date: 10/29/2022 Prepared by: Candyce Churn  Exercises - Supine Hamstring Stretch with Strap  - 1 x daily - 7 x weekly - 1 sets - 3 reps - 30 hold - Supine ITB Stretch with Strap  - 1 x daily - 7 x weekly - 1 sets - 3 reps - 30 hold - Supine Figure 4 Piriformis Stretch  - 1 x daily - 7 x weekly - 1 sets - 3 reps - 30 hold - Quadricep Stretch with Chair and Counter Support  - 1 x daily - 7 x weekly - 1 sets - 3 reps - 30 sec hold - Supine Butterfly Groin Stretch  - 1 x daily - 7 x weekly - 1 sets - 5 reps - 30 sec hold - Supine Posterior Pelvic Tilt  - 1 x daily - 7 x weekly - 2 sets - 10 reps - Supine Bridge  - 1 x daily - 7 x weekly - 2 sets - 10 reps - Hooklying Clamshell with Resistance  - 1 x daily - 7 x weekly - 2 sets - 10 reps - Clamshell with Resistance  - 1 x daily - 7 x weekly - 2 sets - 10 reps - Sidelying Hip Abduction  - 1 x daily - 7 x weekly - 2 sets - 10 reps - Side Stepping with Resistance at Ankles  - 1 x daily - 7 x weekly - 3 sets - 10 reps  ASSESSMENT:  CLINICAL IMPRESSION: Jacqueline Jenkins continues to show progress.  She struggled with ball pass but was able to do all other core exercises with ease.    She would benefit from continuing skilled PT for bilateral LE stretching R>L along with post laminectomy protocol.    OBJECTIVE IMPAIRMENTS: decreased mobility, difficulty walking, decreased ROM, decreased strength, increased fascial restrictions, increased muscle spasms, impaired flexibility, impaired sensation, improper body mechanics, and pain.   ACTIVITY LIMITATIONS: carrying, lifting, bending, sitting, standing, squatting, sleeping, stairs, transfers, bed mobility, bathing, dressing, and  hygiene/grooming  PARTICIPATION LIMITATIONS: meal prep, cleaning, laundry, interpersonal relationship, driving, shopping, community activity, yard work, and church  PERSONAL FACTORS: Fitness and 1-2 comorbidities: HTN, Hx breast cx  are also affecting patient's functional outcome.   REHAB POTENTIAL: Fair Patient has multiple orthopedic spine issues over a 20 year time period.  She must care for her disabled spouse but does have help now.   CLINICAL DECISION MAKING: Evolving/moderate complexity  EVALUATION COMPLEXITY: Moderate   GOALS: Goals reviewed  with patient? Yes  SHORT TERM GOALS: Target date: 10/29/2022    Pain report to be no greater than 4/10  Baseline: Goal status: IN PROGRESS  2.  Patient will be independent with initial HEP  Baseline:  Goal status: MET   LONG TERM GOALS: Target date: 11/26/2022    Patient to report pain no greater than 2/10  Baseline:  Goal status: INITIAL  2.  Patient to be independent with advanced HEP  Baseline:  Goal status: INITIAL  3.  Radicular symptoms to be centralized Baseline:  Goal status: INITIAL  4.  Patient to be able to sleep through the night  Baseline:  Goal status: INITIAL  5.  Patient to report 85% improvement in overall symptoms  Baseline:  Goal status: INITIAL  6.  FOTO to improve to 61  and functional tests to improve by 2-3 seconds Baseline: FOTO 41 on eval  5 times sit to stand: 20.10 sec Timed up and go (TUG): 12.01 sec Goal status: INITIAL  PLAN:  PT FREQUENCY: 2x/week  PT DURATION: 8 weeks  PLANNED INTERVENTIONS: Therapeutic exercises, Therapeutic activity, Neuromuscular re-education, Balance training, Gait training, Patient/Family education, Self Care, Joint mobilization, Stair training, DME instructions, Aquatic Therapy, Dry Needling, Electrical stimulation, Spinal mobilization, Cryotherapy, Moist heat, Taping, Traction, Ultrasound, Ionotophoresis 4mg /ml Dexamethasone, Manual therapy, and  Re-evaluation.  PLAN FOR NEXT SESSION: Nustep, review and update HEP, add further LE stretching and Right hip strengthening along with left gluteus medius strengthening  Kabao Leite B. Jamal Haskin, PT 11/18/22 4:24 PM   [facility] [address], [place], [place] [address] Phone # [phone] Fax [phone]

## 2022-11-20 ENCOUNTER — Ambulatory Visit: Payer: Medicare Other

## 2022-11-20 DIAGNOSIS — R252 Cramp and spasm: Secondary | ICD-10-CM

## 2022-11-20 DIAGNOSIS — M6281 Muscle weakness (generalized): Secondary | ICD-10-CM | POA: Diagnosis not present

## 2022-11-20 DIAGNOSIS — R293 Abnormal posture: Secondary | ICD-10-CM | POA: Diagnosis not present

## 2022-11-20 DIAGNOSIS — M25551 Pain in right hip: Secondary | ICD-10-CM

## 2022-11-20 DIAGNOSIS — M5416 Radiculopathy, lumbar region: Secondary | ICD-10-CM | POA: Diagnosis not present

## 2022-11-20 DIAGNOSIS — R262 Difficulty in walking, not elsewhere classified: Secondary | ICD-10-CM | POA: Diagnosis not present

## 2022-11-20 NOTE — Therapy (Signed)
OUTPATIENT PHYSICAL THERAPY THORACOLUMBAR TREATMENT NOTE   Patient Name: Jacqueline Jenkins MRN: [id number] DOB:[birth year], 72 y.o., female Today's Date: 11/20/2022  END OF SESSION:  PT End of Session - 11/20/22 1454     Visit Number 9    Date for PT Re-Evaluation 11/26/22    Authorization Type MEDICARE PART A AND B    PT Start Time 1445    PT Stop Time 1529    PT Time Calculation (min) 44 min    Activity Tolerance Patient tolerated treatment well    Behavior During Therapy WFL for tasks assessed/performed              Past Medical History:  Diagnosis Date   Allergic rhinitis, cause unspecified    Anemia    history of anemia while teenager   BRCA2 positive    Breast cancer ([place]) 2016   Left Breast Cancer   Breast cancer of upper-outer quadrant of left female breast ([place])    chemo complete 01/2016, radiation 04/2016   DDD (degenerative disc disease)    Diverticulitis    Family history of breast cancer 08/30/2015   Dx. In maternal grandmother in her 66s-40; dx. In maternal first cousin in her 12s-60s    Family history of colon cancer 08/30/2015   Dx. In maternal uncle in his 42s    Hyperlipidemia    Hypertension    Left kidney mass 04/06/2018   Mild growth from Ct 2013 to 04/06/2018; Renal US 2020 no growth- will not follow up   Neuropathy    Osteopenia 12/2011   Spine T -0.4, Femur -2.1   Personal history of chemotherapy 2016   Left Breast Cancer   Personal history of radiation therapy 2016   Left Breast Cancer   PONV (postoperative nausea and vomiting)    Splenic artery aneurysm ([place]) 2013   COILS DONE   Vitamin D deficiency    Past Surgical History:  Procedure Laterality Date   BREAST LUMPECTOMY Left 2016   BREAST LUMPECTOMY WITH RADIOACTIVE SEED AND SENTINEL LYMPH NODE BIOPSY Left 08/29/2015   Procedure: LEFT BREAST LUMPECTOMY WITH RADIOACTIVE SEED WITH AXILLARY SENTINEL LYMPH NODE BIOPSY;  Surgeon: Excell Seltzer, MD;  Location: [facility];   Service: General;  Laterality: Left;   [place]   COLONOSCOPY  Jan. 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   Dural AV fistula repair   IR ANGIO EXTERNAL CAROTID SEL EXT CAROTID BILAT MOD SED  02/03/2017   IR ANGIO EXTERNAL CAROTID SEL EXT CAROTID UNI R MOD SED  03/05/2017   IR ANGIO INTRA EXTRACRAN SEL COM CAROTID INNOMINATE UNI R MOD SED  03/05/2017   IR ANGIO INTRA EXTRACRAN SEL INTERNAL CAROTID BILAT MOD SED  02/03/2017   IR ANGIO VERTEBRAL SEL VERTEBRAL BILAT MOD SED  02/03/2017   IR ANGIOGRAM FOLLOW UP STUDY  03/05/2017   IR NEURO EACH ADD'L AFTER BASIC UNI RIGHT (MS)  02/03/2017   IR NEURO EACH ADD'L AFTER BASIC UNI RIGHT (MS)  03/05/2017   IR TRANSCATH/EMBOLIZ  03/05/2017   LAPAROSCOPIC BILATERAL SALPINGO OOPHERECTOMY Bilateral 05/16/2016   Procedure: LAPAROSCOPIC BILATERAL SALPINGO OOPHORECTOMY;  Surgeon: Paula Compton, MD;  Location: [facility];  Service: Gynecology;  Laterality: Bilateral;   LUMBAR DISC SURGERY  2001, 2010   PORTACATH PLACEMENT Bilateral 10/09/2015   Procedure: INSERTION PORT-A-CATH;  Surgeon: Excell Seltzer, MD;  Location: [facility];  Service: General;  Laterality: Bilateral;   Portacath removal     RADIOLOGY  WITH ANESTHESIA N/A 03/05/2017   Procedure: ARTERIOGRAM ONYX EMBOLIZATION OF FISTULA;  Surgeon: Consuella Lose, MD;  Location: [facility];  Service: Radiology;  Laterality: N/A;   RE-EXCISION OF BREAST LUMPECTOMY Left 09/07/2015   Procedure: RE-EXCISION OF LEFT BREAST LUMPECTOMY;  Surgeon: Excell Seltzer, MD;  Location: [facility];  Service: General;  Laterality: Left;   [place]   cervical diskectomy   SPINE SURGERY  2001, 2010   Lumbar diskectomy   splenic aneurysm  02/10/2012   Aneurysm of splenic artery, coil placed, Dr. 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   Genetic testing 09/19/2015   BRCA2 positive 09/19/2015   Breast cancer of upper-outer quadrant of left female breast ([place]) 08/21/2015   Medication management 06/07/2015   Essential hypertension 06/07/2015   Abnormal glucose 11/16/2013   Allergic rhinitis    Hyperlipidemia, mixed    Lumbar degenerative disc disease    Vitamin D deficiency    Aneurysm of splenic artery ([place]) 01/19/2012    PCP: Unk Pinto, MD  REFERRING PROVIDER: Kristeen Miss, MD  REFERRING DIAG: M54.16 (ICD-10-CM) - Radiculopathy, lumbar region  Rationale for Evaluation and Treatment: Rehabilitation  THERAPY DIAG:  Radiculopathy, lumbar region  Pain in right hip  Difficulty in walking, not elsewhere classified  Cramp and spasm  Muscle weakness (generalized)  Abnormal posture  ONSET DATE: 09/30/2022  SUBJECTIVE:                                                                                                                                                                                           SUBJECTIVE STATEMENT: Pt states that the pain continues to be less frequent.  When it does flare up, it seems to be just as severe but it just doesn't happen as often.           PERTINENT HISTORY:  Lumbar laminectomy 09-02-22 (3rd lumbar surgery)  PAIN:  Are you having pain? Yes: NPRS scale: 0/10 Pain location: most pain is post rest and start up pain; right hip Pain description: sore muscle thigh, and some pain in groin, describes a disconnected kind of feeling Aggravating factors: prolonged sitting Relieving factors: heat  PRECAUTIONS:  Back  WEIGHT BEARING RESTRICTIONS: No  FALLS:  Has patient fallen in last 6 months?  No  LIVING ENVIRONMENT: Lives with: lives with their spouse Lives in: House/apartment  OCCUPATION: Retired  PLOF: Independent, Independent with basic ADLs, Independent with household mobility without device, Independent with community mobility without device, Independent with homemaking with ambulation, Independent with gait, and Independent with transfers  PATIENT GOALS: to eliminate hip pain so that she can properly recover from her back surgery  NEXT MD VISIT:   OBJECTIVE:   DIAGNOSTIC FINDINGS: 07/11/22 IMPRESSION: 1. Lumbar spondylosis and degenerative disc disease, causing prominent impingement at L3-4; moderate impingement at L4-5 and L5-S1; and 3 mm degenerative anterolisthesis at L4-5 and L5-S1. 2. The dominant finding on today's exam is the right lateral recess and foraminal disc protrusion at L3-4 contributing substantially to the prominent right central and subarticular lateral recess stenosis at this level.  PATIENT SURVEYS:  FOTO 41 (predicted 39)  SCREENING FOR RED FLAGS: Bowel or bladder incontinence: No Spinal tumors: No Cauda equina syndrome: No Compression fracture: No Abdominal aneurysm: No  COGNITION: Overall cognitive status: Within functional limits for tasks assessed     SENSATION: Mild L3-L4 radicular symptoms linger in the right thigh  MUSCLE LENGTH: Hamstrings: Right 50 deg; Left 60 deg Thomas test: Right pos ; Left pos   POSTURE: decreased lumbar lordosis and posterior pelvic tilt  LUMBAR ROM:  Deferred due to 4 weeks post op: complete this at 8 weeks AROM eval  Flexion   Extension   Right lateral flexion   Left lateral flexion   Right rotation   Left rotation    (Blank rows = not tested)  LOWER EXTREMITY ROM:     WFL  LOWER EXTREMITY MMT:    Generally 4/5 right LE and 4+/5 left LE LUMBAR SPECIAL TESTS:  See diagnostic findings  FUNCTIONAL TESTS:  5 times sit to stand: 20.10 sec Timed up and go (TUG): 12.01  sec  GAIT: Distance walked: 30 Assistive device utilized: None Level of assistance: Modified independence Comments: antalgic  TODAY'S TREATMENT:    DATE: 11/20/22 Recumbent bike x 5 min Level 2 (PT present to discuss progress) Hip matrix with 40 lbs : hip abd and extension both x 20 attempted 40 lbs on hip flexion but patient unable to do 40 lbs, decreased to 25 lbs for hip flexion x 20 each (or 2 sets of 10) Lateral band walks at barre with blue band x 3 laps at counter Cone touches 3 x 10 each LE (floor), cone on rolling stool (about chair height) Lunges to BOSU x 10 each LE fwd then lateral  Supine clam with yellow loop x 20 Side lying clam with yellow loop x 20 each side Supine bridge with clam with yellow loop 2 x 10 PPT x 20 PPT with 90/90 heel tap x 20 PPT with dying bug x 20 (Supported) PPT with bilateral pull downs (red tband with handles PT holding band overhead) x 20  DATE: 11/18/22 Nustep x 5 min Level 5 (time ended up being 6 min due to patient wanted to continue until she completed 1 lap) Lateral band walks at barre with blue band x 3 laps at counter Cone touches 3 x 10 each LE (floor), cone on rolling stool (about chair height) Standing alternating cone tap ups x 20 Standing hip abduction and extension with 5 lb ankle weights  20 each LE of each exercise Supine clam with blue loop x 20 Supine bridge with clam  with blue loop 2 x 10 Side lying clam with blue loop x 20 each side Side lying hip abduction x 20 each side  PPT x 20 PPT with 90/90 heel tap x 20 PPT with dying bug x 20 Attempted PPT with ball pass (patient fatigued and had difficulty getting the technique down)    DATE: 11/13/22 Nustep x 5 min Level 1 Lateral band walks at counter with blue band x 3 laps at counter Sit to stand x 10 with 5 lb kb Squats x 10 with 5 lb kb  Supine clam with red loop x 20 Supine bridge with clam with red loop 2 x 10 Side lying clam with red loop x 20 each side Side  lying hip abduction x 20 each side  PPT x 20 PPT with 90/90 heel tap x 20 PPT with dying bug x 20 Star crunch modified due to DAR x 20  DATE: 11/11/22 Nustep x 5 min Level 1 Lateral band walks at counter with blue band x 3 laps at counter Standing hamstring stretch at steps 3 x 30 sec each LE Standing hip flexor/quad stretch 3 x 30 sec each LE Sit to stand x 5 Squats x 5  Supine clam with red loop x 20 Supine bridge with clam with red loop 2 x 10 Side lying clam with red loop x 20 each side Side lying hip abduction x 20 each side                                PATIENT EDUCATION:  Education details: Initiated HEP and educated on anatomy of the hip including labrum and possibility of labral involvement in the hip Person educated: Patient Education method: Consulting civil engineer, Media planner, Verbal cues, and Handouts Education comprehension: verbalized understanding, returned demonstration, and verbal cues required  HOME EXERCISE PROGRAM: Access Code: [email or web address] URL: [email or web address] Date: 10/29/2022 Prepared by: Candyce Churn  Exercises - Supine Hamstring Stretch with Strap  - 1 x daily - 7 x weekly - 1 sets - 3 reps - 30 hold - Supine ITB Stretch with Strap  - 1 x daily - 7 x weekly - 1 sets - 3 reps - 30 hold - Supine Figure 4 Piriformis Stretch  - 1 x daily - 7 x weekly - 1 sets - 3 reps - 30 hold - Quadricep Stretch with Chair and Counter Support  - 1 x daily - 7 x weekly - 1 sets - 3 reps - 30 sec hold - Supine Butterfly Groin Stretch  - 1 x daily - 7 x weekly - 1 sets - 5 reps - 30 sec hold - Supine Posterior Pelvic Tilt  - 1 x daily - 7 x weekly - 2 sets - 10 reps - Supine Bridge  - 1 x daily - 7 x weekly - 2 sets - 10 reps - Hooklying Clamshell with Resistance  - 1 x daily - 7 x weekly - 2 sets - 10 reps - Clamshell with Resistance  - 1 x daily - 7 x weekly - 2 sets - 10 reps - Sidelying Hip Abduction  - 1 x daily - 7 x weekly - 2 sets - 10 reps - Side  Stepping with Resistance at Ankles  - 1 x daily - 7 x weekly - 3 sets - 10 reps  ASSESSMENT:  CLINICAL IMPRESSION: Zarriyah continues to show progress.  She struggled with ball pass but was able to do  all other core exercises with ease.    She would benefit from continuing skilled PT for bilateral LE stretching R>L along with post laminectomy protocol.    OBJECTIVE IMPAIRMENTS: decreased mobility, difficulty walking, decreased ROM, decreased strength, increased fascial restrictions, increased muscle spasms, impaired flexibility, impaired sensation, improper body mechanics, and pain.   ACTIVITY LIMITATIONS: carrying, lifting, bending, sitting, standing, squatting, sleeping, stairs, transfers, bed mobility, bathing, dressing, and hygiene/grooming  PARTICIPATION LIMITATIONS: meal prep, cleaning, laundry, interpersonal relationship, driving, shopping, community activity, yard work, and church  PERSONAL FACTORS: Fitness and 1-2 comorbidities: HTN, Hx breast cx  are also affecting patient's functional outcome.   REHAB POTENTIAL: Fair Patient has multiple orthopedic spine issues over a 20 year time period.  She must care for her disabled spouse but does have help now.   CLINICAL DECISION MAKING: Evolving/moderate complexity  EVALUATION COMPLEXITY: Moderate   GOALS: Goals reviewed with patient? Yes  SHORT TERM GOALS: Target date: 10/29/2022    Pain report to be no greater than 4/10  Baseline: Goal status: IN PROGRESS  2.  Patient will be independent with initial HEP  Baseline:  Goal status: MET   LONG TERM GOALS: Target date: 11/26/2022    Patient to report pain no greater than 2/10  Baseline:  Goal status: INITIAL  2.  Patient to be independent with advanced HEP  Baseline:  Goal status: INITIAL  3.  Radicular symptoms to be centralized Baseline:  Goal status: INITIAL  4.  Patient to be able to sleep through the night  Baseline:  Goal status: INITIAL  5.  Patient to report  85% improvement in overall symptoms  Baseline:  Goal status: INITIAL  6.  FOTO to improve to 61  and functional tests to improve by 2-3 seconds Baseline: FOTO 41 on eval  5 times sit to stand: 20.10 sec Timed up and go (TUG): 12.01 sec Goal status: INITIAL  PLAN:  PT FREQUENCY: 2x/week  PT DURATION: 8 weeks  PLANNED INTERVENTIONS: Therapeutic exercises, Therapeutic activity, Neuromuscular re-education, Balance training, Gait training, Patient/Family education, Self Care, Joint mobilization, Stair training, DME instructions, Aquatic Therapy, Dry Needling, Electrical stimulation, Spinal mobilization, Cryotherapy, Moist heat, Taping, Traction, Ultrasound, Ionotophoresis 4mg /ml Dexamethasone, Manual therapy, and Re-evaluation.  PLAN FOR NEXT SESSION: Nustep, review and update HEP, add further LE stretching and Right hip strengthening along with left gluteus medius strengthening  Bynum Mccullars B. Matsuko Kretz, PT 11/20/22 3:30 PM   [facility] [address], [place], [place] [address] Phone # [phone] Fax [phone]

## 2022-11-24 DIAGNOSIS — D3131 Benign neoplasm of right choroid: Secondary | ICD-10-CM | POA: Diagnosis not present

## 2022-11-24 DIAGNOSIS — H2513 Age-related nuclear cataract, bilateral: Secondary | ICD-10-CM | POA: Diagnosis not present

## 2022-11-24 DIAGNOSIS — D3132 Benign neoplasm of left choroid: Secondary | ICD-10-CM | POA: Diagnosis not present

## 2022-11-25 ENCOUNTER — Ambulatory Visit: Payer: Medicare Other

## 2022-11-25 DIAGNOSIS — M25551 Pain in right hip: Secondary | ICD-10-CM

## 2022-11-25 DIAGNOSIS — M6281 Muscle weakness (generalized): Secondary | ICD-10-CM | POA: Diagnosis not present

## 2022-11-25 DIAGNOSIS — M5416 Radiculopathy, lumbar region: Secondary | ICD-10-CM

## 2022-11-25 DIAGNOSIS — R293 Abnormal posture: Secondary | ICD-10-CM | POA: Diagnosis not present

## 2022-11-25 DIAGNOSIS — R262 Difficulty in walking, not elsewhere classified: Secondary | ICD-10-CM | POA: Diagnosis not present

## 2022-11-25 DIAGNOSIS — R252 Cramp and spasm: Secondary | ICD-10-CM | POA: Diagnosis not present

## 2022-11-25 NOTE — Therapy (Signed)
OUTPATIENT PHYSICAL THERAPY THORACOLUMBAR TREATMENT NOTE Progress Note Reporting Period 10/01/22 to 11/25/22  See note below for Objective Data and Assessment of Progress/Goals.       Patient Name: Jacqueline Jenkins MRN: [id number] DOB:1951/02/05, 72 y.o., female Today's Date: 11/25/2022  END OF SESSION:  PT End of Session - 11/25/22 1449     Visit Number 10    Date for PT Re-Evaluation 01/20/23    Authorization Type MEDICARE PART A AND B    PT Start Time 1449    PT Stop Time 1530    PT Time Calculation (min) 41 min    Activity Tolerance Patient tolerated treatment well    Behavior During Therapy WFL for tasks assessed/performed              Past Medical History:  Diagnosis Date   Allergic rhinitis, cause unspecified    Anemia    history of anemia while teenager   BRCA2 positive    Breast cancer ([place]) 2016   Left Breast Cancer   Breast cancer of upper-outer quadrant of left female breast ([place])    chemo complete 01/2016, radiation 04/2016   DDD (degenerative disc disease)    Diverticulitis    Family history of breast cancer 08/30/2015   Dx. In maternal grandmother in her 57s-40; dx. In maternal first cousin in her 51s-60s    Family history of colon cancer 08/30/2015   Dx. In maternal uncle in his 45s    Hyperlipidemia    Hypertension    Left kidney mass 04/06/2018   Mild growth from Ct 2013 to 04/06/2018; Renal US 2020 no growth- will not follow up   Neuropathy    Osteopenia 12/2011   Spine T -0.4, Femur -2.1   Personal history of chemotherapy 2016   Left Breast Cancer   Personal history of radiation therapy 2016   Left Breast Cancer   PONV (postoperative nausea and vomiting)    Splenic artery aneurysm ([place]) 2013   COILS DONE   Vitamin D deficiency    Past Surgical History:  Procedure Laterality Date   BREAST LUMPECTOMY Left 2016   BREAST LUMPECTOMY WITH RADIOACTIVE SEED AND SENTINEL LYMPH NODE BIOPSY Left 08/29/2015   Procedure: LEFT BREAST LUMPECTOMY WITH  RADIOACTIVE SEED WITH AXILLARY SENTINEL LYMPH NODE BIOPSY;  Surgeon: Excell Seltzer, MD;  Location: [facility];  Service: General;  Laterality: Left;   [place]   COLONOSCOPY  Jan. 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   Dural AV fistula repair   IR ANGIO EXTERNAL CAROTID SEL EXT CAROTID BILAT MOD SED  02/03/2017   IR ANGIO EXTERNAL CAROTID SEL EXT CAROTID UNI R MOD SED  03/05/2017   IR ANGIO INTRA EXTRACRAN SEL COM CAROTID INNOMINATE UNI R MOD SED  03/05/2017   IR ANGIO INTRA EXTRACRAN SEL INTERNAL CAROTID BILAT MOD SED  02/03/2017   IR ANGIO VERTEBRAL SEL VERTEBRAL BILAT MOD SED  02/03/2017   IR ANGIOGRAM FOLLOW UP STUDY  03/05/2017   IR NEURO EACH ADD'L AFTER BASIC UNI RIGHT (MS)  02/03/2017   IR NEURO EACH ADD'L AFTER BASIC UNI RIGHT (MS)  03/05/2017   IR TRANSCATH/EMBOLIZ  03/05/2017   LAPAROSCOPIC BILATERAL SALPINGO OOPHERECTOMY Bilateral 05/16/2016   Procedure: LAPAROSCOPIC BILATERAL SALPINGO OOPHORECTOMY;  Surgeon: Paula Compton, MD;  Location: [facility];  Service: Gynecology;  Laterality: Bilateral;   LUMBAR DISC SURGERY  2001, 2010   PORTACATH PLACEMENT Bilateral 10/09/2015   Procedure: INSERTION PORT-A-CATH;  Surgeon:  Excell Seltzer, MD;  Location: [facility];  Service: General;  Laterality: Bilateral;   Portacath removal     RADIOLOGY WITH ANESTHESIA N/A 03/05/2017   Procedure: ARTERIOGRAM ONYX EMBOLIZATION OF FISTULA;  Surgeon: Consuella Lose, MD;  Location: [facility];  Service: Radiology;  Laterality: N/A;   RE-EXCISION OF BREAST LUMPECTOMY Left 09/07/2015   Procedure: RE-EXCISION OF LEFT BREAST LUMPECTOMY;  Surgeon: Excell Seltzer, MD;  Location: [facility];  Service: General;  Laterality: Left;   [place]   cervical diskectomy   SPINE SURGERY  2001, 2010   Lumbar diskectomy   splenic aneurysm  02/10/2012   Aneurysm of splenic artery, coil placed, Dr. 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   Genetic testing 09/19/2015   BRCA2 positive 09/19/2015   Breast cancer of upper-outer quadrant of left female breast ([place]) 08/21/2015   Medication management 06/07/2015   Essential hypertension 06/07/2015   Abnormal glucose 11/16/2013   Allergic rhinitis    Hyperlipidemia, mixed    Lumbar degenerative disc disease    Vitamin D deficiency    Aneurysm of splenic artery ([place]) 01/19/2012    PCP: Unk Pinto, MD  REFERRING PROVIDER: Kristeen Miss, MD  REFERRING DIAG: M54.16 (ICD-10-CM) - Radiculopathy, lumbar region  Rationale for Evaluation and Treatment: Rehabilitation  THERAPY DIAG:  Radiculopathy, lumbar region - Plan: PT plan of care cert/re-cert  Pain in right hip - Plan: PT plan of care cert/re-cert  Difficulty in walking, not elsewhere classified - Plan: PT plan of care cert/re-cert  Cramp and spasm - Plan: PT plan of care cert/re-cert  Muscle weakness (generalized) - Plan: PT plan of care cert/re-cert  Abnormal posture - Plan: PT plan of care cert/re-cert  ONSET DATE: [id number]  SUBJECTIVE:                                                                                                                                                                                           SUBJECTIVE STATEMENT: Pt states that she is about 50% improved since initial eval.             PERTINENT HISTORY:  Lumbar laminectomy 09-02-22 (3rd lumbar surgery)  PAIN:  Are you having pain? Yes: NPRS scale: 0/10 Pain location: most pain is post rest and start up pain; right hip Pain  description: sore muscle thigh, and some pain in groin, describes a disconnected kind of feeling Aggravating factors: prolonged sitting Relieving factors: heat  PRECAUTIONS: Back  WEIGHT BEARING RESTRICTIONS: No  FALLS:  Has patient fallen in last 6 months? No  LIVING ENVIRONMENT: Lives with: lives with their spouse Lives in: House/apartment  OCCUPATION: Retired  PLOF: Independent, Independent with basic ADLs, Independent with household mobility without device, Independent with community mobility without device, Independent with homemaking with ambulation, Independent with gait, and Independent with transfers  PATIENT GOALS: to eliminate hip pain so that she can properly recover from her back surgery  NEXT MD VISIT:   OBJECTIVE:   DIAGNOSTIC FINDINGS: 07/11/22 IMPRESSION: 1. Lumbar spondylosis and degenerative disc disease, causing prominent impingement at L3-4; moderate impingement at L4-5 and L5-S1; and 3 mm degenerative anterolisthesis at L4-5 and L5-S1. 2. The dominant finding on today's exam is the right lateral recess and foraminal disc protrusion at L3-4 contributing substantially to the prominent right central and subarticular lateral recess stenosis at this level.  PATIENT SURVEYS:  Initial eval: FOTO 41 (predicted 51) 11/25/22:   42  SCREENING FOR RED FLAGS: Bowel or bladder incontinence: No Spinal tumors: No Cauda equina syndrome: No Compression fracture: No Abdominal aneurysm: No  COGNITION: Overall cognitive status: Within functional limits for tasks assessed     SENSATION: Mild L3-L4 radicular symptoms linger in the right thigh  MUSCLE LENGTH: Hamstrings: Right 50 deg; Left 60 deg (generally about 5-10 degrees improvement) Thomas test: Right pos ; Left pos   POSTURE: decreased lumbar lordosis and posterior pelvic tilt  LUMBAR ROM:  Deferred due to 4 weeks post op: complete this at 8 weeks AROM eval 11/25/22  Flexion NT Fingertips to ankle   Extension  50%  Right lateral flexion  Fingertips to joint line  Left lateral flexion  Fingertips to joint line  Right rotation  25%  Left rotation  25%   (Blank rows = not tested)  LOWER EXTREMITY ROM:     WFL  LOWER EXTREMITY MMT:    Generally 4/5 right LE and 4+/5 left LE LUMBAR SPECIAL TESTS:  See diagnostic findings  FUNCTIONAL TESTS:  Initial eval: 5 times sit to stand: 20.10 sec Timed up and go (TUG): 12.01 sec  11/25/22: 5 times sit to stand: 12.74 sec Timed up and go (TUG): 9.30 sec  GAIT: Distance walked: 30 Assistive device utilized: None Level of assistance: Modified independence Comments: antalgic  TODAY'S TREATMENT:    DATE: 11/25/22 Nustep x 5 min Level 2 (PT present to discuss progress) Recert and [id number] visit completed. Hook lying trunk rotation x 20 Supine IT band stretch 2 x 30  Piriformis stretch 2 x 30 sec Open book stretch x 5 each side hold 10 sec PPT x 20 PPT with 90/90 heel tap x 20 PPT with dying bug x 20 (Supported) Quadruped alternating hip extension x 20 total (10 each LE) Quadruped alternating arms and legs x 20  DATE: 11/20/22 Recumbent bike x 5 min Level 2 (PT present to discuss progress) Hip matrix with 40 lbs : hip abd and extension both x 20 attempted 40 lbs on hip flexion but patient unable to do 40 lbs, decreased to 25 lbs for hip flexion x 20 each (or 2 sets of 10) Lateral band walks at barre with blue band x 3 laps at counter [facility]  touches 3 x 10 each LE (floor), cone on rolling stool (about chair height) Lunges to BOSU x 10 each LE fwd then lateral  Supine clam with yellow loop x 20 Side lying clam with yellow loop x 20 each side Supine bridge with clam with yellow loop 2 x 10 PPT x 20 PPT with 90/90 heel tap x 20 PPT with dying bug x 20 (Supported) PPT with bilateral pull downs (red tband with handles PT holding band overhead) x 20  DATE: 11/18/22 Nustep x 5 min Level 5 (time ended up being 6 min due to patient wanted  to continue until she completed 1 lap) Lateral band walks at barre with blue band x 3 laps at counter [facility] touches 3 x 10 each LE (floor), cone on rolling stool (about chair height) Standing alternating cone tap ups x 20 Standing hip abduction and extension with 5 lb ankle weights  20 each LE of each exercise Supine clam with blue loop x 20 Supine bridge with clam with blue loop 2 x 10 Side lying clam with blue loop x 20 each side Side lying hip abduction x 20 each side  PPT x 20 PPT with 90/90 heel tap x 20 PPT with dying bug x 20 Attempted PPT with ball pass (patient fatigued and had difficulty getting the technique down)    PATIENT EDUCATION:  Education details: Initiated HEP and educated on anatomy of the hip including labrum and possibility of labral involvement in the hip Person educated: Patient Education method: Consulting civil engineer, Demonstration, Verbal cues, and Handouts Education comprehension: verbalized understanding, returned demonstration, and verbal cues required  HOME EXERCISE PROGRAM: Access Code: [email or web address] URL: [email or web address] Date: 10/29/2022 Prepared by: Candyce Churn  Exercises - Supine Hamstring Stretch with Strap  - 1 x daily - 7 x weekly - 1 sets - 3 reps - 30 hold - Supine ITB Stretch with Strap  - 1 x daily - 7 x weekly - 1 sets - 3 reps - 30 hold - Supine Figure 4 Piriformis Stretch  - 1 x daily - 7 x weekly - 1 sets - 3 reps - 30 hold - Quadricep Stretch with Chair and Counter Support  - 1 x daily - 7 x weekly - 1 sets - 3 reps - 30 sec hold - Supine Butterfly Groin Stretch  - 1 x daily - 7 x weekly - 1 sets - 5 reps - 30 sec hold - Supine Posterior Pelvic Tilt  - 1 x daily - 7 x weekly - 2 sets - 10 reps - Supine Bridge  - 1 x daily - 7 x weekly - 2 sets - 10 reps - Hooklying Clamshell with Resistance  - 1 x daily - 7 x weekly - 2 sets - 10 reps - Clamshell with Resistance  - 1 x daily - 7 x weekly - 2 sets - 10 reps - Sidelying Hip  Abduction  - 1 x daily - 7 x weekly - 2 sets - 10 reps - Side Stepping with Resistance at Ankles  - 1 x daily - 7 x weekly - 3 sets - 10 reps  ASSESSMENT:  CLINICAL IMPRESSION: Emsley's objective findings have improved.  She reports 50% improvement in overall function.  She has no pain at the moment and pain episodes are more intermittent.  She should continue to improve.  She is well motivated and compliant.    She would benefit from continuing skilled PT for bilateral LE stretching R>L along with post laminectomy  protocol.    OBJECTIVE IMPAIRMENTS: decreased mobility, difficulty walking, decreased ROM, decreased strength, increased fascial restrictions, increased muscle spasms, impaired flexibility, impaired sensation, improper body mechanics, and pain.   ACTIVITY LIMITATIONS: carrying, lifting, bending, sitting, standing, squatting, sleeping, stairs, transfers, bed mobility, bathing, dressing, and hygiene/grooming  PARTICIPATION LIMITATIONS: meal prep, cleaning, laundry, interpersonal relationship, driving, shopping, community activity, yard work, and church  PERSONAL FACTORS: Fitness and 1-2 comorbidities: HTN, Hx breast cx  are also affecting patient's functional outcome.   REHAB POTENTIAL: Fair Patient has multiple orthopedic spine issues over a 20 year time period.  She must care for her disabled spouse but does have help now.   CLINICAL DECISION MAKING: Evolving/moderate complexity  EVALUATION COMPLEXITY: Moderate   GOALS: Goals reviewed with patient? Yes  SHORT TERM GOALS: Target date: 10/29/2022    Pain report to be no greater than 4/10  Baseline: Goal status: IN PROGRESS  2.  Patient will be independent with initial HEP  Baseline:  Goal status: MET   LONG TERM GOALS: Target date: 11/26/2022    Patient to report pain no greater than 2/10  Baseline:  Goal status: IN PROGRESS  2.  Patient to be independent with advanced HEP  Baseline:  Goal status: IN  PROGRESS  3.  Radicular symptoms to be centralized Baseline:  Goal status: IN PROGRESS  4.  Patient to be able to sleep through the night  Baseline:  Goal status: MET  5.  Patient to report 85% improvement in overall symptoms  Baseline:  Goal status: IN PROGRESS  6.  FOTO to improve to 61  and functional tests to improve by 2-3 seconds Baseline: FOTO 41 on eval  5 times sit to stand: 20.10 sec Timed up and go (TUG): 12.01 sec Goal status: IN PROGRESS  PLAN:  PT FREQUENCY: 2x/week  PT DURATION: 8 weeks  PLANNED INTERVENTIONS: Therapeutic exercises, Therapeutic activity, Neuromuscular re-education, Balance training, Gait training, Patient/Family education, Self Care, Joint mobilization, Stair training, DME instructions, Aquatic Therapy, Dry Needling, Electrical stimulation, Spinal mobilization, Cryotherapy, Moist heat, Taping, Traction, Ultrasound, Ionotophoresis 4mg /ml Dexamethasone, Manual therapy, and Re-evaluation.  PLAN FOR NEXT SESSION: Nustep, review and progress HEP,  core strengthening,  add further LE stretching and Right hip strengthening along with left gluteus medius strengthening.   Anderson Malta B. Kyland No, PT 11/25/22 10:25 PM [facility] [address] [place],  [address] Phone # [phone] Fax [phone]

## 2022-12-16 ENCOUNTER — Ambulatory Visit: Payer: Medicare Other

## 2022-12-18 ENCOUNTER — Ambulatory Visit: Payer: Medicare Other | Attending: Neurological Surgery

## 2022-12-18 ENCOUNTER — Ambulatory Visit: Payer: Medicare Other

## 2022-12-18 DIAGNOSIS — M6281 Muscle weakness (generalized): Secondary | ICD-10-CM | POA: Insufficient documentation

## 2022-12-18 DIAGNOSIS — M5416 Radiculopathy, lumbar region: Secondary | ICD-10-CM | POA: Diagnosis not present

## 2022-12-18 DIAGNOSIS — R252 Cramp and spasm: Secondary | ICD-10-CM | POA: Diagnosis not present

## 2022-12-18 DIAGNOSIS — M25551 Pain in right hip: Secondary | ICD-10-CM | POA: Diagnosis not present

## 2022-12-18 DIAGNOSIS — R293 Abnormal posture: Secondary | ICD-10-CM | POA: Diagnosis not present

## 2022-12-18 DIAGNOSIS — R262 Difficulty in walking, not elsewhere classified: Secondary | ICD-10-CM | POA: Insufficient documentation

## 2022-12-18 NOTE — Therapy (Signed)
OUTPATIENT PHYSICAL THERAPY THORACOLUMBAR TREATMENT NOTE Progress Note Reporting Period 10/01/22 to 11/25/22  See note below for Objective Data and Assessment of Progress/Goals.       Patient Name: Jacqueline Jenkins MRN: [id number] DOB:[birth year], 72 y.o., female Today's Date: 12/18/2022  END OF SESSION:  PT End of Session - 12/18/22 1527     Visit Number 11    Date for PT Re-Evaluation 01/20/23    Authorization Type MEDICARE PART A AND B    PT Start Time 0300    PT Stop Time 0405    PT Time Calculation (min) 65 min    Activity Tolerance Patient tolerated treatment well    Behavior During Therapy [facility] for tasks assessed/performed              Past Medical History:  Diagnosis Date   Allergic rhinitis, cause unspecified    Anemia    history of anemia while teenager   BRCA2 positive    Breast cancer 2016   Left Breast Cancer   Breast cancer of upper-outer quadrant of left female breast    chemo complete 01/2016, radiation 04/2016   DDD (degenerative disc disease)    Diverticulitis    Family history of breast cancer 08/30/2015   Dx. In maternal grandmother in her 14s-40; dx. In maternal first cousin in her 40s-60s    Family history of colon cancer 08/30/2015   Dx. In maternal uncle in his 33s    Hyperlipidemia    Hypertension    Left kidney mass 04/06/2018   Mild growth from Ct 2013 to 04/06/2018; Renal US 2020 no growth- will not follow up   Neuropathy    Osteopenia 12/2011   Spine T -0.4, Femur -2.1   Personal history of chemotherapy 2016   Left Breast Cancer   Personal history of radiation therapy 2016   Left Breast Cancer   PONV (postoperative nausea and vomiting)    Splenic artery aneurysm 2013   COILS DONE   Vitamin D deficiency    Past Surgical History:  Procedure Laterality Date   BREAST LUMPECTOMY Left 2016   BREAST LUMPECTOMY WITH RADIOACTIVE SEED AND SENTINEL LYMPH NODE BIOPSY Left 08/29/2015   Procedure: LEFT BREAST LUMPECTOMY WITH RADIOACTIVE SEED WITH  AXILLARY SENTINEL LYMPH NODE BIOPSY;  Surgeon: Glenna Fellows, MD;  Location:  SURGERY CENTER;  Service: General;  Laterality: Left;   CERVICAL DISC SURGERY  1998   COLONOSCOPY  Jan. 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   Dural AV fistula repair   IR ANGIO EXTERNAL CAROTID SEL EXT CAROTID BILAT MOD SED  02/03/2017   IR ANGIO EXTERNAL CAROTID SEL EXT CAROTID UNI R MOD SED  03/05/2017   IR ANGIO INTRA EXTRACRAN SEL COM CAROTID INNOMINATE UNI R MOD SED  03/05/2017   IR ANGIO INTRA EXTRACRAN SEL INTERNAL CAROTID BILAT MOD SED  02/03/2017   IR ANGIO VERTEBRAL SEL VERTEBRAL BILAT MOD SED  02/03/2017   IR ANGIOGRAM FOLLOW UP STUDY  03/05/2017   IR NEURO EACH ADD'L AFTER BASIC UNI RIGHT (MS)  02/03/2017   IR NEURO EACH ADD'L AFTER BASIC UNI RIGHT (MS)  03/05/2017   IR TRANSCATH/EMBOLIZ  03/05/2017   LAPAROSCOPIC BILATERAL SALPINGO OOPHERECTOMY Bilateral 05/16/2016   Procedure: LAPAROSCOPIC BILATERAL SALPINGO OOPHORECTOMY;  Surgeon: Huel Cote, MD;  Location: [facility];  Service: Gynecology;  Laterality: Bilateral;   LUMBAR DISC SURGERY  2001, 2010   PORTACATH PLACEMENT Bilateral 10/09/2015   Procedure: INSERTION PORT-A-CATH;  Surgeon: Glenna Fellows, MD;  Location: [facility];  Service: General;  Laterality: Bilateral;   Portacath removal     RADIOLOGY WITH ANESTHESIA N/A 03/05/2017   Procedure: ARTERIOGRAM ONYX EMBOLIZATION OF FISTULA;  Surgeon: Lisbeth Renshaw, MD;  Location: [facility] OR;  Service: Radiology;  Laterality: N/A;   RE-EXCISION OF BREAST LUMPECTOMY Left 09/07/2015   Procedure: RE-EXCISION OF LEFT BREAST LUMPECTOMY;  Surgeon: Glenna Fellows, MD;  Location:  SURGERY CENTER;  Service: General;  Laterality: Left;   SPINE SURGERY  1998   cervical diskectomy   SPINE SURGERY  2001, 2010   Lumbar diskectomy   splenic aneurysm  02/10/2012   Aneurysm of splenic artery, coil placed, Dr. 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   Genetic testing 09/19/2015   BRCA2 positive 09/19/2015   Breast cancer of upper-outer quadrant of left female breast 08/21/2015   Medication management 06/07/2015   Essential hypertension 06/07/2015   Abnormal glucose 11/16/2013   Allergic rhinitis    Hyperlipidemia, mixed    Lumbar degenerative disc disease    Vitamin D deficiency    Aneurysm of splenic artery 01/19/2012    PCP: Lucky Cowboy, MD  REFERRING PROVIDER: Barnett Abu, MD  REFERRING DIAG: M54.16 (ICD-10-CM) - Radiculopathy, lumbar region  Rationale for Evaluation and Treatment: Rehabilitation  THERAPY DIAG:  Radiculopathy, lumbar region  Pain in right hip  Difficulty in walking, not elsewhere classified  Cramp and spasm  Muscle weakness (generalized)  Abnormal posture  ONSET DATE: 09/30/2022  SUBJECTIVE:                                                                                                                                                                                           SUBJECTIVE STATEMENT: Pt states that she is about 50% improved since initial eval.             PERTINENT HISTORY:  Lumbar laminectomy 09-02-22 (3rd lumbar surgery)  PAIN:  Are you having pain? Yes: NPRS scale: 0/10 Pain location: most pain is post rest and start up pain; right hip Pain description: sore muscle thigh, and some pain in groin, describes a disconnected kind of feeling Aggravating factors: prolonged sitting Relieving factors: heat  PRECAUTIONS: Back  WEIGHT BEARING RESTRICTIONS: No  FALLS:  Has patient fallen in last  6 months? No  LIVING ENVIRONMENT: Lives with: lives with their spouse Lives in: House/apartment  OCCUPATION: Retired  PLOF: Independent, Independent with basic ADLs, Independent with household mobility without device, Independent with community mobility without device, Independent with homemaking with ambulation, Independent with gait, and Independent with transfers  PATIENT GOALS: to eliminate hip pain so that she can properly recover from her back surgery  NEXT MD VISIT:   OBJECTIVE:   DIAGNOSTIC FINDINGS: 07/11/22 IMPRESSION: 1. Lumbar spondylosis and degenerative disc disease, causing prominent impingement at L3-4; moderate impingement at L4-5 and L5-S1; and 3 mm degenerative anterolisthesis at L4-5 and L5-S1. 2. The dominant finding on today's exam is the right lateral recess and foraminal disc protrusion at L3-4 contributing substantially to the prominent right central and subarticular lateral recess stenosis at this level.  PATIENT SURVEYS:  Initial eval: FOTO 41 (predicted 51) 11/25/22:   42  SCREENING FOR RED FLAGS: Bowel or bladder incontinence: No Spinal tumors: No Cauda equina syndrome: No Compression fracture: No Abdominal aneurysm: No  COGNITION: Overall cognitive status: Within functional limits for tasks assessed     SENSATION: Mild L3-L4 radicular symptoms linger in the right thigh  MUSCLE LENGTH: Hamstrings: Right 50 deg; Left 60 deg (generally about 5-10 degrees improvement) Thomas test: Right pos ; Left pos   POSTURE: decreased lumbar lordosis and posterior pelvic tilt  LUMBAR ROM:  Deferred due to 4 weeks post op: complete this at 8 weeks AROM eval 11/25/22  Flexion NT Fingertips to ankle  Extension  50%  Right lateral flexion  Fingertips to joint line  Left lateral flexion  Fingertips to joint line  Right rotation  25%  Left rotation  25%   (Blank rows = not tested)  LOWER EXTREMITY ROM:     WFL  LOWER EXTREMITY MMT:    Generally  4/5 right LE and 4+/5 left LE LUMBAR SPECIAL TESTS:  See diagnostic findings  FUNCTIONAL TESTS:  Initial eval: 5 times sit to stand: 20.10 sec Timed up and go (TUG): 12.01 sec  11/25/22: 5 times sit to stand: 12.74 sec Timed up and go (TUG): 9.30 sec  GAIT: Distance walked: 30 Assistive device utilized: None Level of assistance: Modified independence Comments: antalgic  TODAY'S TREATMENT:    DATE: 12/18/22 Nustep x 7 min Level 2 (PT present to discuss progress) Lunge to BOSU 2  x 10 each LE fwd then lateral  Step up and hold on balance pad x 10 each LE fwd and lateral both  Cone touches 3 x 10 each LE (floor) cone on counter top Hip matrix 25 lbs : both hip extension and abduction 2 x 10 each Supine with legs propped: ice to right hip in the area of the IT band x 10 min  DATE: 11/25/22 Nustep x 5 min Level 2 (PT present to discuss progress) Recert and 10th visit completed. Hook lying trunk rotation x 20 Supine IT band stretch 2 x 30  Piriformis stretch 2 x 30 sec Open book stretch x 5 each side hold 10 sec PPT x 20 PPT with 90/90 heel tap x 20 PPT with dying bug x 20 (Supported) Quadruped alternating hip extension x 20 total (10 each LE) Quadruped alternating arms and legs x 20  DATE: 11/20/22 Recumbent bike x 5 min Level 2 (PT present to discuss progress) Hip matrix with 40 lbs : hip abd and extension both x 20 attempted 40 lbs  on hip flexion but patient unable to do 40 lbs, decreased to 25 lbs for hip flexion x 20 each (or 2 sets of 10) Lateral band walks at barre with blue band x 3 laps at counter Cone touches 3 x 10 each LE (floor), cone on rolling stool (about chair height) Lunges to BOSU x 10 each LE fwd then lateral  Supine clam with yellow loop x 20 Side lying clam with yellow loop x 20 each side Supine bridge with clam with yellow loop 2 x 10 PPT x 20 PPT with 90/90 heel tap x 20 PPT with dying bug x 20 (Supported) PPT with bilateral pull downs (red tband  with handles PT holding band overhead) x 20  PATIENT EDUCATION:  Education details: Initiated HEP and educated on anatomy of the hip including labrum and possibility of labral involvement in the hip Person educated: Patient Education method: Programmer, multimedia, Facilities manager, Verbal cues, and Handouts Education comprehension: verbalized understanding, returned demonstration, and verbal cues required  HOME EXERCISE PROGRAM: Access Code: [email or web address] URL: [email or web address] Date: 10/29/2022 Prepared by: Mikey Kirschner  Exercises - Supine Hamstring Stretch with Strap  - 1 x daily - 7 x weekly - 1 sets - 3 reps - 30 hold - Supine ITB Stretch with Strap  - 1 x daily - 7 x weekly - 1 sets - 3 reps - 30 hold - Supine Figure 4 Piriformis Stretch  - 1 x daily - 7 x weekly - 1 sets - 3 reps - 30 hold - Quadricep Stretch with Chair and Counter Support  - 1 x daily - 7 x weekly - 1 sets - 3 reps - 30 sec hold - Supine Butterfly Groin Stretch  - 1 x daily - 7 x weekly - 1 sets - 5 reps - 30 sec hold - Supine Posterior Pelvic Tilt  - 1 x daily - 7 x weekly - 2 sets - 10 reps - Supine Bridge  - 1 x daily - 7 x weekly - 2 sets - 10 reps - Hooklying Clamshell with Resistance  - 1 x daily - 7 x weekly - 2 sets - 10 reps - Clamshell with Resistance  - 1 x daily - 7 x weekly - 2 sets - 10 reps - Sidelying Hip Abduction  - 1 x daily - 7 x weekly - 2 sets - 10 reps - Side Stepping with Resistance at Ankles  - 1 x daily - 7 x weekly - 3 sets - 10 reps  ASSESSMENT:  CLINICAL IMPRESSION: Jacqueline Jenkins is progressing appropriately with exception of right hip pain.  Her symptoms seem to be a combination of arthritic hip and bursitis which we have worked diligently to resolve.  She continues to have significant trendelenburg gait but this has improved since initial visit. We will continue through cert period and have her follow up about her hip pain with ortho MD.  She sees neuro surgeon for post op follow up next  week.  She would benefit from continuing skilled PT for bilateral LE stretching R>L along with post laminectomy protocol.    OBJECTIVE IMPAIRMENTS: decreased mobility, difficulty walking, decreased ROM, decreased strength, increased fascial restrictions, increased muscle spasms, impaired flexibility, impaired sensation, improper body mechanics, and pain.   ACTIVITY LIMITATIONS: carrying, lifting, bending, sitting, standing, squatting, sleeping, stairs, transfers, bed mobility, bathing, dressing, and hygiene/grooming  PARTICIPATION LIMITATIONS: meal prep, cleaning, laundry, interpersonal relationship, driving, shopping, community activity, yard work, and church  PERSONAL FACTORS: Fitness and 1-2 comorbidities: HTN, Hx  breast cx  are also affecting patient's functional outcome.   REHAB POTENTIAL: Fair Patient has multiple orthopedic spine issues over a 20 year time period.  She must care for her disabled spouse but does have help now.   CLINICAL DECISION MAKING: Evolving/moderate complexity  EVALUATION COMPLEXITY: Moderate   GOALS: Goals reviewed with patient? Yes  SHORT TERM GOALS: Target date: 10/29/2022    Pain report to be no greater than 4/10  Baseline: Goal status: IN PROGRESS  2.  Patient will be independent with initial HEP  Baseline:  Goal status: MET   LONG TERM GOALS: Target date: 11/26/2022    Patient to report pain no greater than 2/10  Baseline:  Goal status: IN PROGRESS  2.  Patient to be independent with advanced HEP  Baseline:  Goal status: IN PROGRESS  3.  Radicular symptoms to be centralized Baseline:  Goal status: IN PROGRESS  4.  Patient to be able to sleep through the night  Baseline:  Goal status: MET  5.  Patient to report 85% improvement in overall symptoms  Baseline:  Goal status: IN PROGRESS  6.  FOTO to improve to 61  and functional tests to improve by 2-3 seconds Baseline: FOTO 41 on eval  5 times sit to stand: 20.10 sec Timed up and  go (TUG): 12.01 sec Goal status: IN PROGRESS  PLAN:  PT FREQUENCY: 2x/week  PT DURATION: 8 weeks  PLANNED INTERVENTIONS: Therapeutic exercises, Therapeutic activity, Neuromuscular re-education, Balance training, Gait training, Patient/Family education, Self Care, Joint mobilization, Stair training, DME instructions, Aquatic Therapy, Dry Needling, Electrical stimulation, Spinal mobilization, Cryotherapy, Moist heat, Taping, Traction, Ultrasound, Ionotophoresis /ml Dexamethasone, Manual therapy, and Re-evaluation.  PLAN FOR NEXT SESSION: Nustep,  LE stretching and Right hip strengthening along with left gluteus medius strengthening, continue core strengthening as well but right hip appears to be most limiting functionally.   Victorino Dike B. Isaak Delmundo, PT 12/18/22 4:16 PM  [facility] [address] [place], [place] [address] Phone # [phone] Fax [phone]

## 2022-12-22 ENCOUNTER — Ambulatory Visit: Payer: Medicare Other

## 2022-12-22 DIAGNOSIS — R262 Difficulty in walking, not elsewhere classified: Secondary | ICD-10-CM | POA: Diagnosis not present

## 2022-12-22 DIAGNOSIS — M6281 Muscle weakness (generalized): Secondary | ICD-10-CM | POA: Diagnosis not present

## 2022-12-22 DIAGNOSIS — R252 Cramp and spasm: Secondary | ICD-10-CM | POA: Diagnosis not present

## 2022-12-22 DIAGNOSIS — M25551 Pain in right hip: Secondary | ICD-10-CM | POA: Diagnosis not present

## 2022-12-22 DIAGNOSIS — M5416 Radiculopathy, lumbar region: Secondary | ICD-10-CM | POA: Diagnosis not present

## 2022-12-22 DIAGNOSIS — R293 Abnormal posture: Secondary | ICD-10-CM

## 2022-12-22 NOTE — Therapy (Signed)
OUTPATIENT PHYSICAL THERAPY THORACOLUMBAR TREATMENT NOTE Progress Note Reporting Period 10/01/22 to 11/25/22  See note below for Objective Data and Assessment of Progress/Goals.       Patient Name: Jacqueline Jenkins MRN: [id number] DOB:06-Sep-1950, 72 y.o., female Today's Date: 12/22/2022  END OF SESSION:  PT End of Session - 12/22/22 1501     Visit Number 12    Date for PT Re-Evaluation 01/20/23    Authorization Type MEDICARE PART A AND B    PT Start Time 1448    PT Stop Time 1528    PT Time Calculation (min) 40 min    Activity Tolerance Patient tolerated treatment well    Behavior During Therapy [facility] for tasks assessed/performed              Past Medical History:  Diagnosis Date   Allergic rhinitis, cause unspecified    Anemia    history of anemia while teenager   BRCA2 positive    Breast cancer 2016   Left Breast Cancer   Breast cancer of upper-outer quadrant of left female breast    chemo complete 01/2016, radiation 04/2016   DDD (degenerative disc disease)    Diverticulitis    Family history of breast cancer 08/30/2015   Dx. In maternal grandmother in her 69s-40; dx. In maternal first cousin in her 31s-60s    Family history of colon cancer 08/30/2015   Dx. In maternal uncle in his 58s    Hyperlipidemia    Hypertension    Left kidney mass 04/06/2018   Mild growth from Ct 2013 to 04/06/2018; Renal US 2020 no growth- will not follow up   Neuropathy    Osteopenia 12/2011   Spine T -0.4, Femur -2.1   Personal history of chemotherapy 2016   Left Breast Cancer   Personal history of radiation therapy 2016   Left Breast Cancer   PONV (postoperative nausea and vomiting)    Splenic artery aneurysm 2013   COILS DONE   Vitamin D deficiency    Past Surgical History:  Procedure Laterality Date   BREAST LUMPECTOMY Left 2016   BREAST LUMPECTOMY WITH RADIOACTIVE SEED AND SENTINEL LYMPH NODE BIOPSY Left 08/29/2015   Procedure: LEFT BREAST LUMPECTOMY WITH RADIOACTIVE SEED WITH  AXILLARY SENTINEL LYMPH NODE BIOPSY;  Surgeon: Glenna Fellows, MD;  Location: [facility];  Service: General;  Laterality: Left;   CERVICAL DISC SURGERY  1998   COLONOSCOPY  Jan. 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   Dural AV fistula repair   IR ANGIO EXTERNAL CAROTID SEL EXT CAROTID BILAT MOD SED  02/03/2017   IR ANGIO EXTERNAL CAROTID SEL EXT CAROTID UNI R MOD SED  03/05/2017   IR ANGIO INTRA EXTRACRAN SEL COM CAROTID INNOMINATE UNI R MOD SED  03/05/2017   IR ANGIO INTRA EXTRACRAN SEL INTERNAL CAROTID BILAT MOD SED  02/03/2017   IR ANGIO VERTEBRAL SEL VERTEBRAL BILAT MOD SED  02/03/2017   IR ANGIOGRAM FOLLOW UP STUDY  03/05/2017   IR NEURO EACH ADD'L AFTER BASIC UNI RIGHT (MS)  02/03/2017   IR NEURO EACH ADD'L AFTER BASIC UNI RIGHT (MS)  03/05/2017   IR TRANSCATH/EMBOLIZ  03/05/2017   LAPAROSCOPIC BILATERAL SALPINGO OOPHERECTOMY Bilateral 05/16/2016   Procedure: LAPAROSCOPIC BILATERAL SALPINGO OOPHORECTOMY;  Surgeon: Huel Cote, MD;  Location: [facility];  Service: Gynecology;  Laterality: Bilateral;   LUMBAR DISC SURGERY  2001, 2010   PORTACATH PLACEMENT Bilateral 10/09/2015   Procedure: INSERTION PORT-A-CATH;  Surgeon: Glenna Fellows, MD;  Location: [facility];  Service: General;  Laterality: Bilateral;   Portacath removal     RADIOLOGY WITH ANESTHESIA N/A 03/05/2017   Procedure: ARTERIOGRAM ONYX EMBOLIZATION OF FISTULA;  Surgeon: Lisbeth Renshaw, MD;  Location: [facility];  Service: Radiology;  Laterality: N/A;   RE-EXCISION OF BREAST LUMPECTOMY Left 09/07/2015   Procedure: RE-EXCISION OF LEFT BREAST LUMPECTOMY;  Surgeon: Glenna Fellows, MD;  Location: [facility];  Service: General;  Laterality: Left;   SPINE SURGERY  1998   cervical diskectomy   SPINE SURGERY  2001, 2010   Lumbar diskectomy   splenic aneurysm  02/10/2012   Aneurysm of splenic artery, coil placed, Dr. 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   Genetic testing 09/19/2015   BRCA2 positive 09/19/2015   Breast cancer of upper-outer quadrant of left female breast 08/21/2015   Medication management 06/07/2015   Essential hypertension 06/07/2015   Abnormal glucose 11/16/2013   Allergic rhinitis    Hyperlipidemia, mixed    Lumbar degenerative disc disease    Vitamin D deficiency    Aneurysm of splenic artery 01/19/2012    PCP: Lucky Cowboy, MD  REFERRING PROVIDER: Barnett Abu, MD  REFERRING DIAG: M54.16 (ICD-10-CM) - Radiculopathy, lumbar region  Rationale for Evaluation and Treatment: Rehabilitation  THERAPY DIAG:  Pain in right hip  Difficulty in walking, not elsewhere classified  Cramp and spasm  Muscle weakness (generalized)  Abnormal posture  ONSET DATE: 09/30/2022  SUBJECTIVE:                                                                                                                                                                                           SUBJECTIVE STATEMENT: Pt states that she is about 50% improved since initial eval.             PERTINENT HISTORY:  Lumbar laminectomy 09-02-22 (3rd lumbar surgery)  PAIN:  Are you having pain? Yes: NPRS scale: 0/10 Pain location: most pain is post rest and start up pain; right hip Pain description: sore muscle thigh, and some pain in groin, describes a disconnected kind of feeling Aggravating factors: prolonged sitting Relieving factors: heat  PRECAUTIONS: Back  WEIGHT BEARING RESTRICTIONS:  No  FALLS:  Has patient fallen in last 6 months? No  LIVING  ENVIRONMENT: Lives with: lives with their spouse Lives in: House/apartment  OCCUPATION: Retired  PLOF: Independent, Independent with basic ADLs, Independent with household mobility without device, Independent with community mobility without device, Independent with homemaking with ambulation, Independent with gait, and Independent with transfers  PATIENT GOALS: to eliminate hip pain so that she can properly recover from her back surgery  NEXT MD VISIT:   OBJECTIVE:   DIAGNOSTIC FINDINGS: 07/11/22 IMPRESSION: 1. Lumbar spondylosis and degenerative disc disease, causing prominent impingement at L3-4; moderate impingement at L4-5 and L5-S1; and 3 mm degenerative anterolisthesis at L4-5 and L5-S1. 2. The dominant finding on today's exam is the right lateral recess and foraminal disc protrusion at L3-4 contributing substantially to the prominent right central and subarticular lateral recess stenosis at this level.  PATIENT SURVEYS:  Initial eval: FOTO 41 (predicted 51) 11/25/22:   42  SCREENING FOR RED FLAGS: Bowel or bladder incontinence: No Spinal tumors: No Cauda equina syndrome: No Compression fracture: No Abdominal aneurysm: No  COGNITION: Overall cognitive status: Within functional limits for tasks assessed     SENSATION: Mild L3-L4 radicular symptoms linger in the right thigh  MUSCLE LENGTH: Hamstrings: Right 50 deg; Left 60 deg (generally about 5-10 degrees improvement) Thomas test: Right pos ; Left pos   POSTURE: decreased lumbar lordosis and posterior pelvic tilt  LUMBAR ROM:  Deferred due to 4 weeks post op: complete this at 8 weeks AROM eval 11/25/22  Flexion NT Fingertips to ankle  Extension  50%  Right lateral flexion  Fingertips to joint line  Left lateral flexion  Fingertips to joint line  Right rotation  25%  Left rotation  25%   (Blank rows = not tested)  LOWER EXTREMITY ROM:     WFL  LOWER EXTREMITY MMT:    Generally 4/5 right LE and 4+/5  left LE LUMBAR SPECIAL TESTS:  See diagnostic findings  FUNCTIONAL TESTS:  Initial eval: 5 times sit to stand: 20.10 sec Timed up and go (TUG): 12.01 sec  11/25/22: 5 times sit to stand: 12.74 sec Timed up and go (TUG): 9.30 sec  GAIT: Distance walked: 30 Assistive device utilized: None Level of assistance: Modified independence Comments: antalgic  TODAY'S TREATMENT:    DATE: 12/22/22 Seated clam with blue loop x 20 Seated LAQ x 20 with 2lb Seated hip ER with 2 lbs x 20 each Supine bridge x 20 Supine clam x 20 with blue loop Side lying clam with blue loop x 20 Supine SLR with 2 lbs 2 x 10 each Side lying hip abduction with 2 lbs 2 x 10 each LE Prone hip extension with 2 lbs 2 x 10 each LE Sit to stand in tandems without UE assist x 10 each LE Cone touches 2 x 10 with cone on counter, then with cone on elevated stool x 10 each LE (floor)  DATE: 12/18/22 Nustep x 7 min Level 2 (PT present to discuss progress) Lunge to BOSU 2  x 10 each LE fwd then lateral  Step up and hold on balance pad x 10 each LE fwd and lateral both  Cone touches 3 x 10 each LE (floor) cone on counter top Hip matrix 25 lbs : both hip extension and abduction 2 x 10 each Supine with legs propped: ice to right hip in the area of the IT band x 10 min  DATE: 11/25/22 Nustep x 5 min Level 2 (  PT present to discuss progress) Recert and 10th visit completed. Hook lying trunk rotation x 20 Supine IT band stretch 2 x 30  Piriformis stretch 2 x 30 sec Open book stretch x 5 each side hold 10 sec PPT x 20 PPT with 90/90 heel tap x 20 PPT with dying bug x 20 (Supported) Quadruped alternating hip extension x 20 total (10 each LE) Quadruped alternating arms and legs x 20   PATIENT EDUCATION:  Education details: Initiated HEP and educated on anatomy of the hip including labrum and possibility of labral involvement in the hip Person educated: Patient Education method: Programmer, multimedia, Facilities manager, Verbal cues,  and Handouts Education comprehension: verbalized understanding, returned demonstration, and verbal cues required  HOME EXERCISE PROGRAM: Access Code: [email or web address] URL: [email or web address] Date: 10/29/2022 Prepared by: Mikey Kirschner  Exercises - Supine Hamstring Stretch with Strap  - 1 x daily - 7 x weekly - 1 sets - 3 reps - 30 hold - Supine ITB Stretch with Strap  - 1 x daily - 7 x weekly - 1 sets - 3 reps - 30 hold - Supine Figure 4 Piriformis Stretch  - 1 x daily - 7 x weekly - 1 sets - 3 reps - 30 hold - Quadricep Stretch with Chair and Counter Support  - 1 x daily - 7 x weekly - 1 sets - 3 reps - 30 sec hold - Supine Butterfly Groin Stretch  - 1 x daily - 7 x weekly - 1 sets - 5 reps - 30 sec hold - Supine Posterior Pelvic Tilt  - 1 x daily - 7 x weekly - 2 sets - 10 reps - Supine Bridge  - 1 x daily - 7 x weekly - 2 sets - 10 reps - Hooklying Clamshell with Resistance  - 1 x daily - 7 x weekly - 2 sets - 10 reps - Clamshell with Resistance  - 1 x daily - 7 x weekly - 2 sets - 10 reps - Sidelying Hip Abduction  - 1 x daily - 7 x weekly - 2 sets - 10 reps - Side Stepping with Resistance at Ankles  - 1 x daily - 7 x weekly - 3 sets - 10 reps  ASSESSMENT:  CLINICAL IMPRESSION: Laquitha is having less right hip pain but this is still her primary limiting factor.  [place] gait is significantly improved but still obvious.  She is very compliant and well motivated.    She would benefit from continuing skilled PT for bilateral LE stretching R>L along with post laminectomy protocol.    OBJECTIVE IMPAIRMENTS: decreased mobility, difficulty walking, decreased ROM, decreased strength, increased fascial restrictions, increased muscle spasms, impaired flexibility, impaired sensation, improper body mechanics, and pain.   ACTIVITY LIMITATIONS: carrying, lifting, bending, sitting, standing, squatting, sleeping, stairs, transfers, bed mobility, bathing, dressing, and  hygiene/grooming  PARTICIPATION LIMITATIONS: meal prep, cleaning, laundry, interpersonal relationship, driving, shopping, community activity, yard work, and church  PERSONAL FACTORS: Fitness and 1-2 comorbidities: HTN, Hx breast cx  are also affecting patient's functional outcome.   REHAB POTENTIAL: Fair Patient has multiple orthopedic spine issues over a 20 year time period.  She must care for her disabled spouse but does have help now.   CLINICAL DECISION MAKING: Evolving/moderate complexity  EVALUATION COMPLEXITY: Moderate   GOALS: Goals reviewed with patient? Yes  SHORT TERM GOALS: Target date: 10/29/2022    Pain report to be no greater than 4/10  Baseline: Goal status: IN PROGRESS  2.  Patient will be independent  with initial HEP  Baseline:  Goal status: MET   LONG TERM GOALS: Target date: 11/26/2022    Patient to report pain no greater than 2/10  Baseline:  Goal status: IN PROGRESS  2.  Patient to be independent with advanced HEP  Baseline:  Goal status: IN PROGRESS  3.  Radicular symptoms to be centralized Baseline:  Goal status: IN PROGRESS  4.  Patient to be able to sleep through the night  Baseline:  Goal status: MET  5.  Patient to report 85% improvement in overall symptoms  Baseline:  Goal status: IN PROGRESS  6.  FOTO to improve to 61  and functional tests to improve by 2-3 seconds Baseline: FOTO 41 on eval  5 times sit to stand: 20.10 sec Timed up and go (TUG): 12.01 sec Goal status: IN PROGRESS  PLAN:  PT FREQUENCY: 2x/week  PT DURATION: 8 weeks  PLANNED INTERVENTIONS: Therapeutic exercises, Therapeutic activity, Neuromuscular re-education, Balance training, Gait training, Patient/Family education, Self Care, Joint mobilization, Stair training, DME instructions, Aquatic Therapy, Dry Needling, Electrical stimulation, Spinal mobilization, Cryotherapy, Moist heat, Taping, Traction, Ultrasound, Ionotophoresis 4mg /ml Dexamethasone, Manual  therapy, and Re-evaluation.  PLAN FOR NEXT SESSION: Nustep,  focus on right hip stability and glut medius strength bilaterally.    Victorino Dike B. Allyse Fregeau, PT 12/22/22 3:32 PM [facility] [address] [place], [place] [address] Phone # [phone] Fax [phone]

## 2022-12-24 ENCOUNTER — Ambulatory Visit: Payer: Medicare Other

## 2022-12-24 DIAGNOSIS — R252 Cramp and spasm: Secondary | ICD-10-CM | POA: Diagnosis not present

## 2022-12-24 DIAGNOSIS — R293 Abnormal posture: Secondary | ICD-10-CM | POA: Diagnosis not present

## 2022-12-24 DIAGNOSIS — M25551 Pain in right hip: Secondary | ICD-10-CM

## 2022-12-24 DIAGNOSIS — R262 Difficulty in walking, not elsewhere classified: Secondary | ICD-10-CM

## 2022-12-24 DIAGNOSIS — M6281 Muscle weakness (generalized): Secondary | ICD-10-CM | POA: Diagnosis not present

## 2022-12-24 DIAGNOSIS — M5416 Radiculopathy, lumbar region: Secondary | ICD-10-CM | POA: Diagnosis not present

## 2022-12-24 NOTE — Therapy (Signed)
OUTPATIENT PHYSICAL THERAPY THORACOLUMBAR TREATMENT NOTE Progress Note Reporting Period 10/01/22 to 11/25/22  See note below for Objective Data and Assessment of Progress/Goals.       Patient Name: Jacqueline Jenkins MRN: [id number] DOB:September 03, 1950, 72 y.o., female Today's Date: 12/24/2022  END OF SESSION:  PT End of Session - 12/24/22 1454     Visit Number 13    Date for PT Re-Evaluation 01/20/23    Authorization Type MEDICARE PART A AND B    PT Start Time 1447    PT Stop Time 1526    PT Time Calculation (min) 39 min    Activity Tolerance Patient tolerated treatment well    Behavior During Therapy WFL for tasks assessed/performed              Past Medical History:  Diagnosis Date   Allergic rhinitis, cause unspecified    Anemia    history of anemia while teenager   BRCA2 positive    Breast cancer 2016   Left Breast Cancer   Breast cancer of upper-outer quadrant of left female breast    chemo complete 01/2016, radiation 04/2016   DDD (degenerative disc disease)    Diverticulitis    Family history of breast cancer 08/30/2015   Dx. In maternal grandmother in her 80s-40; dx. In maternal first cousin in her 24s-60s    Family history of colon cancer 08/30/2015   Dx. In maternal uncle in his 32s    Hyperlipidemia    Hypertension    Left kidney mass 04/06/2018   Mild growth from Ct 2013 to 04/06/2018; Renal US 2020 no growth- will not follow up   Neuropathy    Osteopenia 12/2011   Spine T -0.4, Femur -2.1   Personal history of chemotherapy 2016   Left Breast Cancer   Personal history of radiation therapy 2016   Left Breast Cancer   PONV (postoperative nausea and vomiting)    Splenic artery aneurysm 2013   COILS DONE   Vitamin D deficiency    Past Surgical History:  Procedure Laterality Date   BREAST LUMPECTOMY Left 2016   BREAST LUMPECTOMY WITH RADIOACTIVE SEED AND SENTINEL LYMPH NODE BIOPSY Left 08/29/2015   Procedure: LEFT BREAST LUMPECTOMY WITH RADIOACTIVE SEED WITH  AXILLARY SENTINEL LYMPH NODE BIOPSY;  Surgeon: Glenna Fellows, MD;  Location: [facility];  Service: General;  Laterality: Left;   CERVICAL DISC SURGERY  1998   COLONOSCOPY  Jan. 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   Dural AV fistula repair   IR ANGIO EXTERNAL CAROTID SEL EXT CAROTID BILAT MOD SED  02/03/2017   IR ANGIO EXTERNAL CAROTID SEL EXT CAROTID UNI R MOD SED  03/05/2017   IR ANGIO INTRA EXTRACRAN SEL COM CAROTID INNOMINATE UNI R MOD SED  03/05/2017   IR ANGIO INTRA EXTRACRAN SEL INTERNAL CAROTID BILAT MOD SED  02/03/2017   IR ANGIO VERTEBRAL SEL VERTEBRAL BILAT MOD SED  02/03/2017   IR ANGIOGRAM FOLLOW UP STUDY  03/05/2017   IR NEURO EACH ADD'L AFTER BASIC UNI RIGHT (MS)  02/03/2017   IR NEURO EACH ADD'L AFTER BASIC UNI RIGHT (MS)  03/05/2017   IR TRANSCATH/EMBOLIZ  03/05/2017   LAPAROSCOPIC BILATERAL SALPINGO OOPHERECTOMY Bilateral 05/16/2016   Procedure: LAPAROSCOPIC BILATERAL SALPINGO OOPHORECTOMY;  Surgeon: Huel Cote, MD;  Location: [facility];  Service: Gynecology;  Laterality: Bilateral;   LUMBAR DISC SURGERY  2001, 2010   PORTACATH PLACEMENT Bilateral 10/09/2015   Procedure: INSERTION PORT-A-CATH;  Surgeon: Glenna Fellows, MD;  Location: [facility];  Service: General;  Laterality: Bilateral;   Portacath removal     RADIOLOGY WITH ANESTHESIA N/A 03/05/2017   Procedure: ARTERIOGRAM ONYX EMBOLIZATION OF FISTULA;  Surgeon: Lisbeth Renshaw, MD;  Location: [facility];  Service: Radiology;  Laterality: N/A;   RE-EXCISION OF BREAST LUMPECTOMY Left 09/07/2015   Procedure: RE-EXCISION OF LEFT BREAST LUMPECTOMY;  Surgeon: Glenna Fellows, MD;  Location: [facility];  Service: General;  Laterality: Left;   SPINE SURGERY  1998   cervical diskectomy   SPINE SURGERY  2001, 2010   Lumbar diskectomy   splenic aneurysm  02/10/2012   Aneurysm of splenic artery, coil placed, Dr. 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   Genetic testing 09/19/2015   BRCA2 positive 09/19/2015   Breast cancer of upper-outer quadrant of left female breast 08/21/2015   Medication management 06/07/2015   Essential hypertension 06/07/2015   Abnormal glucose 11/16/2013   Allergic rhinitis    Hyperlipidemia, mixed    Lumbar degenerative disc disease    Vitamin D deficiency    Aneurysm of splenic artery 01/19/2012    PCP: Lucky Cowboy, MD  REFERRING PROVIDER: Barnett Abu, MD  REFERRING DIAG: M54.16 (ICD-10-CM) - Radiculopathy, lumbar region  Rationale for Evaluation and Treatment: Rehabilitation  THERAPY DIAG:  Pain in right hip  Difficulty in walking, not elsewhere classified  Cramp and spasm  Muscle weakness (generalized)  ONSET DATE: 09/30/2022  SUBJECTIVE:                                                                                                                                                                                           SUBJECTIVE STATEMENT: Pt denies any issues.  Still working on her walking.              PERTINENT HISTORY:  Lumbar laminectomy 09-02-22 (3rd lumbar surgery)  PAIN:  12/24/22: Are you having pain? Yes: NPRS scale: 0/10 Pain location: most pain is post rest and start up pain; right hip Pain description: sore muscle thigh, and some pain in groin, describes a disconnected kind of feeling Aggravating factors: prolonged sitting Relieving factors: heat  PRECAUTIONS: Back  WEIGHT BEARING RESTRICTIONS: No  FALLS:  Has patient fallen in last 6 months? No  LIVING ENVIRONMENT: Lives with:  lives with their spouse Lives in: House/apartment  OCCUPATION: Retired  PLOF: Independent, Independent with basic ADLs, Independent with household mobility without device, Independent with community mobility without device, Independent with homemaking with ambulation, Independent with gait, and Independent with transfers  PATIENT GOALS: to eliminate hip pain so that she can properly recover from her back surgery  NEXT MD VISIT:   OBJECTIVE:   DIAGNOSTIC FINDINGS: 07/11/22 IMPRESSION: 1. Lumbar spondylosis and degenerative disc disease, causing prominent impingement at L3-4; moderate impingement at L4-5 and L5-S1; and 3 mm degenerative anterolisthesis at L4-5 and L5-S1. 2. The dominant finding on today's exam is the right lateral recess and foraminal disc protrusion at L3-4 contributing substantially to the prominent right central and subarticular lateral recess stenosis at this level.  PATIENT SURVEYS:  Initial eval: FOTO 41 (predicted 51) 11/25/22:   42  SCREENING FOR RED FLAGS: Bowel or bladder incontinence: No Spinal tumors: No Cauda equina syndrome: No Compression fracture: No Abdominal aneurysm: No  COGNITION: Overall cognitive status: Within functional limits for tasks assessed     SENSATION: Mild L3-L4 radicular symptoms linger in the right thigh  MUSCLE LENGTH: Hamstrings: Right 50 deg; Left 60 deg (generally about 5-10 degrees improvement) Thomas test: Right pos ; Left pos   POSTURE: decreased lumbar lordosis and posterior pelvic tilt  LUMBAR ROM:  Deferred due to 4 weeks post op: complete this at 8 weeks AROM eval 11/25/22  Flexion NT Fingertips to ankle  Extension  50%  Right lateral flexion  Fingertips to joint line  Left lateral flexion  Fingertips to joint line  Right rotation  25%  Left rotation  25%   (Blank rows = not tested)  LOWER EXTREMITY ROM:     WFL  LOWER EXTREMITY MMT:    Generally 4/5 right LE and 4+/5 left LE LUMBAR SPECIAL  TESTS:  See diagnostic findings  FUNCTIONAL TESTS:  Initial eval: 5 times sit to stand: 20.10 sec Timed up and go (TUG): 12.01 sec  11/25/22: 5 times sit to stand: 12.74 sec Timed up and go (TUG): 9.30 sec  GAIT: Distance walked: 30 Assistive device utilized: None Level of assistance: Modified independence Comments: antalgic  TODAY'S TREATMENT:    DATE: 12/24/22 Nustep x 5 min level 5 1 D ball toss 3 x 10 Seated clam with blue loop x 20 Seated hip adduction with purple ball x 20 Lateral band walks with yellow band x 5 laps in // bars Sit to stand in tandems without UE assist x 10 each LE Gait training with emphasis on level pelvis and proper heel to toe progression  DATE: 12/22/22 Seated clam with blue loop x 20 Seated LAQ x 20 with 2lb Seated hip ER with 2 lbs x 20 each Supine bridge x 20 Supine clam x 20 with blue loop Side lying clam with blue loop x 20 Supine SLR with 2 lbs 2 x 10 each Side lying hip abduction with 2 lbs 2 x 10 each LE Prone hip extension with 2 lbs 2 x 10 each LE Sit to stand in tandems without UE assist x 10 each LE Cone touches 2 x 10 with cone on counter, then with cone on elevated stool x 10 each LE (floor)  DATE: 12/18/22 Nustep x 7 min Level 2 (PT present to discuss progress) Lunge to BOSU 2  x 10 each LE fwd then lateral  Step up and  hold on balance pad x 10 each LE fwd and lateral both  Cone touches 3 x 10 each LE (floor) cone on counter top Hip matrix 25 lbs : both hip extension and abduction 2 x 10 each Supine with legs propped: ice to right hip in the area of the IT band x 10 min  DATE: 11/25/22 Nustep x 5 min Level 2 (PT present to discuss progress) Recert and 10th visit completed. Hook lying trunk rotation x 20 Supine IT band stretch 2 x 30  Piriformis stretch 2 x 30 sec Open book stretch x 5 each side hold 10 sec PPT x 20 PPT with 90/90 heel tap x 20 PPT with dying bug x 20 (Supported) Quadruped alternating hip extension x  20 total (10 each LE) Quadruped alternating arms and legs x 20   PATIENT EDUCATION:  Education details: Initiated HEP and educated on anatomy of the hip including labrum and possibility of labral involvement in the hip Person educated: Patient Education method: Programmer, multimedia, Facilities manager, Verbal cues, and Handouts Education comprehension: verbalized understanding, returned demonstration, and verbal cues required  HOME EXERCISE PROGRAM: Access Code: [email or web address] URL: [email or web address] Date: 10/29/2022 Prepared by: Mikey Kirschner  Exercises - Supine Hamstring Stretch with Strap  - 1 x daily - 7 x weekly - 1 sets - 3 reps - 30 hold - Supine ITB Stretch with Strap  - 1 x daily - 7 x weekly - 1 sets - 3 reps - 30 hold - Supine Figure 4 Piriformis Stretch  - 1 x daily - 7 x weekly - 1 sets - 3 reps - 30 hold - Quadricep Stretch with Chair and Counter Support  - 1 x daily - 7 x weekly - 1 sets - 3 reps - 30 sec hold - Supine Butterfly Groin Stretch  - 1 x daily - 7 x weekly - 1 sets - 5 reps - 30 sec hold - Supine Posterior Pelvic Tilt  - 1 x daily - 7 x weekly - 2 sets - 10 reps - Supine Bridge  - 1 x daily - 7 x weekly - 2 sets - 10 reps - Hooklying Clamshell with Resistance  - 1 x daily - 7 x weekly - 2 sets - 10 reps - Clamshell with Resistance  - 1 x daily - 7 x weekly - 2 sets - 10 reps - Sidelying Hip Abduction  - 1 x daily - 7 x weekly - 2 sets - 10 reps - Side Stepping with Resistance at Ankles  - 1 x daily - 7 x weekly - 3 sets - 10 reps  ASSESSMENT:  CLINICAL IMPRESSION: Aristea is making excellent progress. She needs reminders for avoiding trendelenburg gait.  With vc's, she is able to ambulate with level pelvis.  She is well motivated and compliant.  She should continue to do well.   She would benefit from continuing skilled PT for bilateral LE stretching R>L along with post laminectomy protocol.    OBJECTIVE IMPAIRMENTS: decreased mobility, difficulty walking,  decreased ROM, decreased strength, increased fascial restrictions, increased muscle spasms, impaired flexibility, impaired sensation, improper body mechanics, and pain.   ACTIVITY LIMITATIONS: carrying, lifting, bending, sitting, standing, squatting, sleeping, stairs, transfers, bed mobility, bathing, dressing, and hygiene/grooming  PARTICIPATION LIMITATIONS: meal prep, cleaning, laundry, interpersonal relationship, driving, shopping, community activity, yard work, and church  PERSONAL FACTORS: Fitness and 1-2 comorbidities: HTN, Hx breast cx  are also affecting patient's functional outcome.   REHAB POTENTIAL: Fair Patient has multiple orthopedic spine  issues over a 20 year time period.  She must care for her disabled spouse but does have help now.   CLINICAL DECISION MAKING: Evolving/moderate complexity  EVALUATION COMPLEXITY: Moderate   GOALS: Goals reviewed with patient? Yes  SHORT TERM GOALS: Target date: 10/29/2022    Pain report to be no greater than 4/10  Baseline: Goal status: MET 12/24/22  2.  Patient will be independent with initial HEP  Baseline:  Goal status: MET   LONG TERM GOALS: Target date: 11/26/2022    Patient to report pain no greater than 2/10  Baseline:  Goal status: MET 12/24/22  2.  Patient to be independent with advanced HEP  Baseline:  Goal status: IN PROGRESS  3.  Radicular symptoms to be centralized Baseline:  Goal status: MET 12/24/22  4.  Patient to be able to sleep through the night  Baseline:  Goal status: MET  5.  Patient to report 85% improvement in overall symptoms  Baseline:  Goal status: IN PROGRESS  6.  FOTO to improve to 61  and functional tests to improve by 2-3 seconds Baseline: FOTO 41 on eval  5 times sit to stand: 20.10 sec Timed up and go (TUG): 12.01 sec Goal status: IN PROGRESS  PLAN:  PT FREQUENCY: 2x/week  PT DURATION: 8 weeks  PLANNED INTERVENTIONS: Therapeutic exercises, Therapeutic activity, Neuromuscular  re-education, Balance training, Gait training, Patient/Family education, Self Care, Joint mobilization, Stair training, DME instructions, Aquatic Therapy, Dry Needling, Electrical stimulation, Spinal mobilization, Cryotherapy, Moist heat, Taping, Traction, Ultrasound, Ionotophoresis 4mg /ml Dexamethasone, Manual therapy, and Re-evaluation.  PLAN FOR NEXT SESSION: Continue with Nustep,  focus on right hip stability and glut medius strength bilaterally.    Victorino Dike B. Merit Maybee, PT 12/24/22 3:31 PM  [facility] [address] [place], [place] [address] Phone # [phone] Fax [phone]

## 2022-12-26 DIAGNOSIS — M5416 Radiculopathy, lumbar region: Secondary | ICD-10-CM | POA: Diagnosis not present

## 2022-12-26 DIAGNOSIS — M25551 Pain in right hip: Secondary | ICD-10-CM | POA: Diagnosis not present

## 2022-12-26 DIAGNOSIS — Z6826 Body mass index (BMI) 26.0-26.9, adult: Secondary | ICD-10-CM | POA: Diagnosis not present

## 2022-12-30 ENCOUNTER — Ambulatory Visit: Payer: Medicare Other

## 2022-12-30 DIAGNOSIS — R252 Cramp and spasm: Secondary | ICD-10-CM

## 2022-12-30 DIAGNOSIS — M25551 Pain in right hip: Secondary | ICD-10-CM

## 2022-12-30 DIAGNOSIS — M6281 Muscle weakness (generalized): Secondary | ICD-10-CM

## 2022-12-30 DIAGNOSIS — M5416 Radiculopathy, lumbar region: Secondary | ICD-10-CM | POA: Diagnosis not present

## 2022-12-30 DIAGNOSIS — R293 Abnormal posture: Secondary | ICD-10-CM | POA: Diagnosis not present

## 2022-12-30 DIAGNOSIS — R262 Difficulty in walking, not elsewhere classified: Secondary | ICD-10-CM | POA: Diagnosis not present

## 2022-12-30 NOTE — Therapy (Unsigned)
OUTPATIENT PHYSICAL THERAPY THORACOLUMBAR TREATMENT NOTE Progress Note Reporting Period 10/01/22 to 11/25/22  See note below for Objective Data and Assessment of Progress/Goals.       Patient Name: Jacqueline Jenkins MRN: [id number] DOB:September 03, 1950, 72 y.o., female Today's Date: 12/24/2022  END OF SESSION:  PT End of Session - 12/24/22 1454     Visit Number 13    Date for PT Re-Evaluation 01/20/23    Authorization Type MEDICARE PART A AND B    PT Start Time 1447    PT Stop Time 1526    PT Time Calculation (min) 39 min    Activity Tolerance Patient tolerated treatment well    Behavior During Therapy WFL for tasks assessed/performed              Past Medical History:  Diagnosis Date   Allergic rhinitis, cause unspecified    Anemia    history of anemia while teenager   BRCA2 positive    Breast cancer 2016   Left Breast Cancer   Breast cancer of upper-outer quadrant of left female breast    chemo complete 01/2016, radiation 04/2016   DDD (degenerative disc disease)    Diverticulitis    Family history of breast cancer 08/30/2015   Dx. In maternal grandmother in her 80s-40; dx. In maternal first cousin in her 24s-60s    Family history of colon cancer 08/30/2015   Dx. In maternal uncle in his 32s    Hyperlipidemia    Hypertension    Left kidney mass 04/06/2018   Mild growth from Ct 2013 to 04/06/2018; Renal US 2020 no growth- will not follow up   Neuropathy    Osteopenia 12/2011   Spine T -0.4, Femur -2.1   Personal history of chemotherapy 2016   Left Breast Cancer   Personal history of radiation therapy 2016   Left Breast Cancer   PONV (postoperative nausea and vomiting)    Splenic artery aneurysm 2013   COILS DONE   Vitamin D deficiency    Past Surgical History:  Procedure Laterality Date   BREAST LUMPECTOMY Left 2016   BREAST LUMPECTOMY WITH RADIOACTIVE SEED AND SENTINEL LYMPH NODE BIOPSY Left 08/29/2015   Procedure: LEFT BREAST LUMPECTOMY WITH RADIOACTIVE SEED WITH  AXILLARY SENTINEL LYMPH NODE BIOPSY;  Surgeon: Glenna Fellows, MD;  Location: [facility];  Service: General;  Laterality: Left;   CERVICAL DISC SURGERY  1998   COLONOSCOPY  Jan. 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   Dural AV fistula repair   IR ANGIO EXTERNAL CAROTID SEL EXT CAROTID BILAT MOD SED  02/03/2017   IR ANGIO EXTERNAL CAROTID SEL EXT CAROTID UNI R MOD SED  03/05/2017   IR ANGIO INTRA EXTRACRAN SEL COM CAROTID INNOMINATE UNI R MOD SED  03/05/2017   IR ANGIO INTRA EXTRACRAN SEL INTERNAL CAROTID BILAT MOD SED  02/03/2017   IR ANGIO VERTEBRAL SEL VERTEBRAL BILAT MOD SED  02/03/2017   IR ANGIOGRAM FOLLOW UP STUDY  03/05/2017   IR NEURO EACH ADD'L AFTER BASIC UNI RIGHT (MS)  02/03/2017   IR NEURO EACH ADD'L AFTER BASIC UNI RIGHT (MS)  03/05/2017   IR TRANSCATH/EMBOLIZ  03/05/2017   LAPAROSCOPIC BILATERAL SALPINGO OOPHERECTOMY Bilateral 05/16/2016   Procedure: LAPAROSCOPIC BILATERAL SALPINGO OOPHORECTOMY;  Surgeon: Huel Cote, MD;  Location: [facility];  Service: Gynecology;  Laterality: Bilateral;   LUMBAR DISC SURGERY  2001, 2010   PORTACATH PLACEMENT Bilateral 10/09/2015   Procedure: INSERTION PORT-A-CATH;  Surgeon: Glenna Fellows, MD;  Location: [facility];  Service: General;  Laterality: Bilateral;   Portacath removal     RADIOLOGY WITH ANESTHESIA N/A 03/05/2017   Procedure: ARTERIOGRAM ONYX EMBOLIZATION OF FISTULA;  Surgeon: Lisbeth Renshaw, MD;  Location: [facility];  Service: Radiology;  Laterality: N/A;   RE-EXCISION OF BREAST LUMPECTOMY Left 09/07/2015   Procedure: RE-EXCISION OF LEFT BREAST LUMPECTOMY;  Surgeon: Glenna Fellows, MD;  Location:  SURGERY CENTER;  Service: General;  Laterality: Left;   SPINE SURGERY  1998   cervical diskectomy   SPINE SURGERY  2001, 2010   Lumbar diskectomy   splenic aneurysm  02/10/2012   Aneurysm of splenic artery, coil placed, Dr. 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   Genetic testing 09/19/2015   BRCA2 positive 09/19/2015   Breast cancer of upper-outer quadrant of left female breast 08/21/2015   Medication management 06/07/2015   Essential hypertension 06/07/2015   Abnormal glucose 11/16/2013   Allergic rhinitis    Hyperlipidemia, mixed    Lumbar degenerative disc disease    Vitamin D deficiency    Aneurysm of splenic artery 01/19/2012    PCP: Lucky Cowboy, MD  REFERRING PROVIDER: Barnett Abu, MD  REFERRING DIAG: M54.16 (ICD-10-CM) - Radiculopathy, lumbar region  Rationale for Evaluation and Treatment: Rehabilitation  THERAPY DIAG:  Pain in right hip  Difficulty in walking, not elsewhere classified  Cramp and spasm  Muscle weakness (generalized)  ONSET DATE: 09/30/2022  SUBJECTIVE:                                                                                                                                                                                           SUBJECTIVE STATEMENT: Pt states she saw Dr. Danielle Dess and he will be doing a transforaminal injection.  Patient still having right hip pain.                PERTINENT HISTORY:  Lumbar laminectomy 09-02-22 (3rd lumbar surgery)  PAIN:  12/30/22: Are you having pain? Yes: NPRS scale: I don't really have pain, the right hip and thigh are just numb 0/10 Pain location: most pain is post rest and start up pain; right hip Pain description: sore muscle thigh, and  some pain in groin, describes a disconnected kind of feeling Aggravating factors: prolonged sitting Relieving factors: heat  PRECAUTIONS:  Back  WEIGHT BEARING RESTRICTIONS: No  FALLS:  Has patient fallen in last 6 months? No  LIVING ENVIRONMENT: Lives with: lives with their spouse Lives in: House/apartment  OCCUPATION: Retired  PLOF: Independent, Independent with basic ADLs, Independent with household mobility without device, Independent with community mobility without device, Independent with homemaking with ambulation, Independent with gait, and Independent with transfers  PATIENT GOALS: to eliminate hip pain so that she can properly recover from her back surgery  NEXT MD VISIT:   OBJECTIVE:   DIAGNOSTIC FINDINGS: 07/11/22 IMPRESSION: 1. Lumbar spondylosis and degenerative disc disease, causing prominent impingement at L3-4; moderate impingement at L4-5 and L5-S1; and 3 mm degenerative anterolisthesis at L4-5 and L5-S1. 2. The dominant finding on today's exam is the right lateral recess and foraminal disc protrusion at L3-4 contributing substantially to the prominent right central and subarticular lateral recess stenosis at this level.  PATIENT SURVEYS:  Initial eval: FOTO 41 (predicted 51) 11/25/22:   42  SCREENING FOR RED FLAGS: Bowel or bladder incontinence: No Spinal tumors: No Cauda equina syndrome: No Compression fracture: No Abdominal aneurysm: No  COGNITION: Overall cognitive status: Within functional limits for tasks assessed     SENSATION: Mild L3-L4 radicular symptoms linger in the right thigh  MUSCLE LENGTH: Hamstrings: Right 50 deg; Left 60 deg (generally about 5-10 degrees improvement) Thomas test: Right pos ; Left pos   POSTURE: decreased lumbar lordosis and posterior pelvic tilt  LUMBAR ROM:  Deferred due to 4 weeks post op: complete this at 8 weeks AROM eval 11/25/22  Flexion NT Fingertips to ankle  Extension  50%  Right lateral flexion  Fingertips to joint line  Left lateral flexion  Fingertips to joint line  Right rotation  25%  Left rotation  25%   (Blank rows = not  tested)  LOWER EXTREMITY ROM:     WFL  LOWER EXTREMITY MMT:    Generally 4/5 right LE and 4+/5 left LE LUMBAR SPECIAL TESTS:  See diagnostic findings  FUNCTIONAL TESTS:  Initial eval: 5 times sit to stand: 20.10 sec Timed up and go (TUG): 12.01 sec  11/25/22: 5 times sit to stand: 12.74 sec Timed up and go (TUG): 9.30 sec  GAIT: Distance walked: 30 Assistive device utilized: None Level of assistance: Modified independence Comments: antalgic  TODAY'S TREATMENT:    DATE: 12/30/22 Nustep x 6 min level 5 Lateral band walks with yellow band x 3 laps  Seated clam with yellow loop x 20 Seated LAQ x 20 with 3lb Seated hip ER with 3 lbs x 20 each Side lying hip abduction with 3 lbs 2 x 10 each LE Supine bridge x 20 Supine clam x 20 with yellow loop Side lying clam with yellow loop x 20 Quadruped donkey kicks with medium resistance loop (pink)   Supine SLR with 2 lbs 2 x 10 each  Prone hip extension with 2 lbs 2 x 10 each LE Sit to stand in tandems without UE assist x 10 each LE Cone touches 2 x 10 with cone on counter, then with cone on elevated stool x 10 each LE (floor)  DATE: 12/24/22 Nustep x 5 min level 5 1 D ball toss 3 x 10 Seated clam with blue loop x 20 Seated hip adduction with purple ball x 20 Lateral band walks with yellow band x 5 laps in // bars Sit to  stand in tandems without UE assist x 10 each LE Gait training with emphasis on level pelvis and proper heel to toe progression  DATE: 12/22/22 Seated clam with blue loop x 20 Seated LAQ x 20 with 2lb Seated hip ER with 2 lbs x 20 each Supine bridge x 20 Supine clam x 20 with blue loop Side lying clam with blue loop x 20 Supine SLR with 2 lbs 2 x 10 each Side lying hip abduction with 2 lbs 2 x 10 each LE Prone hip extension with 2 lbs 2 x 10 each LE Sit to stand in tandems without UE assist x 10 each LE Cone touches 2 x 10 with cone on counter, then with cone on elevated stool x 10 each LE  (floor)  DATE: 12/18/22 Nustep x 7 min Level 2 (PT present to discuss progress) Lunge to BOSU 2  x 10 each LE fwd then lateral  Step up and hold on balance pad x 10 each LE fwd and lateral both  Cone touches 3 x 10 each LE (floor) cone on counter top Hip matrix 25 lbs : both hip extension and abduction 2 x 10 each Supine with legs propped: ice to right hip in the area of the IT band x 10 min  DATE: 11/25/22 Nustep x 5 min Level 2 (PT present to discuss progress) Recert and 10th visit completed. Hook lying trunk rotation x 20 Supine IT band stretch 2 x 30  Piriformis stretch 2 x 30 sec Open book stretch x 5 each side hold 10 sec PPT x 20 PPT with 90/90 heel tap x 20 PPT with dying bug x 20 (Supported) Quadruped alternating hip extension x 20 total (10 each LE) Quadruped alternating arms and legs x 20   PATIENT EDUCATION:  Education details: Initiated HEP and educated on anatomy of the hip including labrum and possibility of labral involvement in the hip Person educated: Patient Education method: Programmer, multimedia, Facilities manager, Verbal cues, and Handouts Education comprehension: verbalized understanding, returned demonstration, and verbal cues required  HOME EXERCISE PROGRAM: Access Code: [email or web address] URL: [email or web address] Date: 10/29/2022 Prepared by: Mikey Kirschner  Exercises - Supine Hamstring Stretch with Strap  - 1 x daily - 7 x weekly - 1 sets - 3 reps - 30 hold - Supine ITB Stretch with Strap  - 1 x daily - 7 x weekly - 1 sets - 3 reps - 30 hold - Supine Figure 4 Piriformis Stretch  - 1 x daily - 7 x weekly - 1 sets - 3 reps - 30 hold - Quadricep Stretch with Chair and Counter Support  - 1 x daily - 7 x weekly - 1 sets - 3 reps - 30 sec hold - Supine Butterfly Groin Stretch  - 1 x daily - 7 x weekly - 1 sets - 5 reps - 30 sec hold - Supine Posterior Pelvic Tilt  - 1 x daily - 7 x weekly - 2 sets - 10 reps - Supine Bridge  - 1 x daily - 7 x weekly - 2 sets -  10 reps - Hooklying Clamshell with Resistance  - 1 x daily - 7 x weekly - 2 sets - 10 reps - Clamshell with Resistance  - 1 x daily - 7 x weekly - 2 sets - 10 reps - Sidelying Hip Abduction  - 1 x daily - 7 x weekly - 2 sets - 10 reps - Side Stepping with Resistance at Ankles  - 1 x daily - 7  x weekly - 3 sets - 10 reps  ASSESSMENT:  CLINICAL IMPRESSION: Erienne is making excellent progress. She needs reminders for avoiding trendelenburg gait.  With vc's, she is able to ambulate with level pelvis.  She is well motivated and compliant.  She should continue to do well.   She would benefit from continuing skilled PT for bilateral LE stretching R>L along with post laminectomy protocol.    OBJECTIVE IMPAIRMENTS: decreased mobility, difficulty walking, decreased ROM, decreased strength, increased fascial restrictions, increased muscle spasms, impaired flexibility, impaired sensation, improper body mechanics, and pain.   ACTIVITY LIMITATIONS: carrying, lifting, bending, sitting, standing, squatting, sleeping, stairs, transfers, bed mobility, bathing, dressing, and hygiene/grooming  PARTICIPATION LIMITATIONS: meal prep, cleaning, laundry, interpersonal relationship, driving, shopping, community activity, yard work, and church  PERSONAL FACTORS: Fitness and 1-2 comorbidities: HTN, Hx breast cx  are also affecting patient's functional outcome.   REHAB POTENTIAL: Fair Patient has multiple orthopedic spine issues over a 20 year time period.  She must care for her disabled spouse but does have help now.   CLINICAL DECISION MAKING: Evolving/moderate complexity  EVALUATION COMPLEXITY: Moderate   GOALS: Goals reviewed with patient? Yes  SHORT TERM GOALS: Target date: 10/29/2022    Pain report to be no greater than 4/10  Baseline: Goal status: MET 12/24/22  2.  Patient will be independent with initial HEP  Baseline:  Goal status: MET   LONG TERM GOALS: Target date: 11/26/2022    Patient to  report pain no greater than 2/10  Baseline:  Goal status: MET 12/24/22  2.  Patient to be independent with advanced HEP  Baseline:  Goal status: IN PROGRESS  3.  Radicular symptoms to be centralized Baseline:  Goal status: MET 12/24/22  4.  Patient to be able to sleep through the night  Baseline:  Goal status: MET  5.  Patient to report 85% improvement in overall symptoms  Baseline:  Goal status: IN PROGRESS  6.  FOTO to improve to 61  and functional tests to improve by 2-3 seconds Baseline: FOTO 41 on eval  5 times sit to stand: 20.10 sec Timed up and go (TUG): 12.01 sec Goal status: IN PROGRESS  PLAN:  PT FREQUENCY: 2x/week  PT DURATION: 8 weeks  PLANNED INTERVENTIONS: Therapeutic exercises, Therapeutic activity, Neuromuscular re-education, Balance training, Gait training, Patient/Family education, Self Care, Joint mobilization, Stair training, DME instructions, Aquatic Therapy, Dry Needling, Electrical stimulation, Spinal mobilization, Cryotherapy, Moist heat, Taping, Traction, Ultrasound, Ionotophoresis 4mg /ml Dexamethasone, Manual therapy, and Re-evaluation.  PLAN FOR NEXT SESSION: Continue with Nustep,  focus on right hip stability and glut medius strength bilaterally.    Victorino Dike B. Makye Radle, PT 12/24/22 3:31 PM  [facility] [address] [place], [place] [address] Phone # [phone] Fax [phone]

## 2023-01-01 ENCOUNTER — Ambulatory Visit: Payer: Medicare Other | Attending: Neurological Surgery

## 2023-01-01 DIAGNOSIS — M25551 Pain in right hip: Secondary | ICD-10-CM

## 2023-01-01 DIAGNOSIS — M6281 Muscle weakness (generalized): Secondary | ICD-10-CM

## 2023-01-01 DIAGNOSIS — M5416 Radiculopathy, lumbar region: Secondary | ICD-10-CM | POA: Diagnosis not present

## 2023-01-01 DIAGNOSIS — R293 Abnormal posture: Secondary | ICD-10-CM | POA: Diagnosis not present

## 2023-01-01 DIAGNOSIS — R252 Cramp and spasm: Secondary | ICD-10-CM

## 2023-01-01 DIAGNOSIS — R262 Difficulty in walking, not elsewhere classified: Secondary | ICD-10-CM | POA: Insufficient documentation

## 2023-01-01 NOTE — Therapy (Signed)
OUTPATIENT PHYSICAL THERAPY THORACOLUMBAR TREATMENT NOTE      Patient Name: Jacqueline Jenkins MRN: [id number] DOB:[birth year], 72 y.o., female Today's Date: 01/01/2023  END OF SESSION:  PT End of Session - 01/01/23 1459     Visit Number 15    Date for PT Re-Evaluation 01/20/23    Authorization Type MEDICARE PART A AND B    PT Start Time 1453    PT Stop Time 1528    PT Time Calculation (min) 35 min    Activity Tolerance Patient tolerated treatment well    Behavior During Therapy WFL for tasks assessed/performed              Past Medical History:  Diagnosis Date   Allergic rhinitis, cause unspecified    Anemia    history of anemia while teenager   BRCA2 positive    Breast cancer (HCC) 2016   Left Breast Cancer   Breast cancer of upper-outer quadrant of left female breast (HCC)    chemo complete 01/2016, radiation 04/2016   DDD (degenerative disc disease)    Diverticulitis    Family history of breast cancer 08/30/2015   Dx. In maternal grandmother in her 54s-40; dx. In maternal first cousin in her 73s-60s    Family history of colon cancer 08/30/2015   Dx. In maternal uncle in his 12s    Hyperlipidemia    Hypertension    Left kidney mass 04/06/2018   Mild growth from Ct 2013 to 04/06/2018; Renal US 2020 no growth- will not follow up   Neuropathy    Osteopenia 12/2011   Spine T -0.4, Femur -2.1   Personal history of chemotherapy 2016   Left Breast Cancer   Personal history of radiation therapy 2016   Left Breast Cancer   PONV (postoperative nausea and vomiting)    Splenic artery aneurysm (HCC) 2013   COILS DONE   Vitamin D deficiency    Past Surgical History:  Procedure Laterality Date   BREAST LUMPECTOMY Left 2016   BREAST LUMPECTOMY WITH RADIOACTIVE SEED AND SENTINEL LYMPH NODE BIOPSY Left 08/29/2015   Procedure: LEFT BREAST LUMPECTOMY WITH RADIOACTIVE SEED WITH AXILLARY SENTINEL LYMPH NODE BIOPSY;  Surgeon: Glenna Fellows, MD;  Location:  S[facility]ERY [facility];   Service: General;  Laterality: Left;   CERVICAL DISC SURGERY  1998   COLONOSCOPY  Jan. 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   Dural AV fistula repair   IR ANGIO EXTERNAL CAROTID SEL EXT CAROTID BILAT MOD SED  02/03/2017   IR ANGIO EXTERNAL CAROTID SEL EXT CAROTID UNI R MOD SED  03/05/2017   IR ANGIO INTRA EXTRACRAN SEL COM CAROTID INNOMINATE UNI R MOD SED  03/05/2017   IR ANGIO INTRA EXTRACRAN SEL INTERNAL CAROTID BILAT MOD SED  02/03/2017   IR ANGIO VERTEBRAL SEL VERTEBRAL BILAT MOD SED  02/03/2017   IR ANGIOGRAM FOLLOW UP STUDY  03/05/2017   IR NEURO EACH ADD'L AFTER BASIC UNI RIGHT (MS)  02/03/2017   IR NEURO EACH ADD'L AFTER BASIC UNI RIGHT (MS)  03/05/2017   IR TRANSCATH/EMBOLIZ  03/05/2017   LAPAROSCOPIC BILATERAL SALPINGO OOPHERECTOMY Bilateral 05/16/2016   Procedure: LAPAROSCOPIC BILATERAL SALPINGO OOPHORECTOMY;  Surgeon: Huel Cote, MD;  Location: [facility];  Service: Gynecology;  Laterality: Bilateral;   LUMBAR DISC SURGERY  2001, 2010   PORTACATH PLACEMENT Bilateral 10/09/2015   Procedure: INSERTION PORT-A-CATH;  Surgeon: Glenna Fellows, MD;  Location: [facility];  Service: General;  Laterality: Bilateral;   Portacath removal  RADIOLOGY WITH ANESTHESIA N/A 03/05/2017   Procedure: ARTERIOGRAM ONYX EMBOLIZATION OF FISTULA;  Surgeon: Lisbeth Renshaw, MD;  Location: [facility] OR;  Service: Radiology;  Laterality: N/A;   RE-EXCISION OF BREAST LUMPECTOMY Left 09/07/2015   Procedure: RE-EXCISION OF LEFT BREAST LUMPECTOMY;  Surgeon: Glenna Fellows, MD;  Location: [facility];  Service: General;  Laterality: Left;   SPINE SURGERY  1998   cervical diskectomy   SPINE SURGERY  2001, 2010   Lumbar diskectomy   splenic aneurysm  02/10/2012   Aneurysm of splenic artery, coil placed, Dr. 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   Genetic testing 09/19/2015   BRCA2 positive 09/19/2015   Breast cancer of upper-outer quadrant of left female breast (HCC) 08/21/2015   Medication management 06/07/2015   Essential hypertension 06/07/2015   Abnormal glucose 11/16/2013   Allergic rhinitis    Hyperlipidemia, mixed    Lumbar degenerative disc disease    Vitamin D deficiency    Aneurysm of splenic artery (HCC) 01/19/2012    PCP: Lucky Cowboy, MD  REFERRING PROVIDER: Barnett Abu, MD  REFERRING DIAG: M54.16 (ICD-10-CM) - Radiculopathy, lumbar region  Rationale for Evaluation and Treatment: Rehabilitation  THERAPY DIAG:  Pain in right hip  Difficulty in walking, not elsewhere classified  Cramp and spasm  Muscle weakness (generalized)  Abnormal posture  ONSET DATE: 09/30/2022  SUBJECTIVE:                                                                                                                                                                                           SUBJECTIVE STATEMENT: Pt states she is doing "ok".                PERTINENT HISTORY:  Lumbar laminectomy 09-02-22 (3rd lumbar surgery)  PAIN:  12/30/22: Are you having pain? Yes: NPRS scale: I don't really have pain, the right hip and thigh are just numb 0/10 Pain location: most pain is post rest and start up pain; right hip Pain description: sore muscle thigh, and some pain in groin, describes a disconnected kind of feeling Aggravating factors: prolonged sitting Relieving factors: heat  PRECAUTIONS: Back  WEIGHT BEARING RESTRICTIONS: No  FALLS:  Has patient fallen in last 6 months? No  LIVING ENVIRONMENT: Lives with: lives with their spouse Lives in:  House/apartment  OCCUPATION: Retired  PLOF: Independent, Independent with basic ADLs, Independent with household mobility without device, Independent with community mobility without device, Independent with homemaking with ambulation, Independent with gait, and Independent with transfers  PATIENT GOALS: to eliminate hip pain so that she can properly recover from her back surgery  NEXT MD VISIT:   OBJECTIVE:   DIAGNOSTIC FINDINGS: 07/11/22 IMPRESSION: 1. Lumbar spondylosis and degenerative disc disease, causing prominent impingement at L3-4; moderate impingement at L4-5 and L5-S1; and 3 mm degenerative anterolisthesis at L4-5 and L5-S1. 2. The dominant finding on today's exam is the right lateral recess and foraminal disc protrusion at L3-4 contributing substantially to the prominent right central and subarticular lateral recess stenosis at this level.  PATIENT SURVEYS:  Initial eval: FOTO 41 (predicted 51) 11/25/22:   42  SCREENING FOR RED FLAGS: Bowel or bladder incontinence: No Spinal tumors: No Cauda equina syndrome: No Compression fracture: No Abdominal aneurysm: No  COGNITION: Overall cognitive status: Within functional limits for tasks assessed     SENSATION: Mild L3-L4 radicular symptoms linger in the right thigh  MUSCLE LENGTH: Hamstrings: Right 50 deg; Left 60 deg (generally about 5-10 degrees improvement) Thomas test: Right pos ; Left pos   POSTURE: decreased lumbar lordosis and posterior pelvic tilt  LUMBAR ROM:  Deferred due to 4 weeks post op: complete this at 8 weeks AROM eval 11/25/22  Flexion NT Fingertips to ankle  Extension  50%  Right lateral flexion  Fingertips to joint line  Left lateral flexion  Fingertips to joint line  Right rotation  25%  Left rotation  25%   (Blank rows = not tested)  LOWER EXTREMITY ROM:     WFL  LOWER EXTREMITY MMT:    Generally 4/5 right LE and 4+/5 left LE LUMBAR SPECIAL TESTS:  See diagnostic  findings  FUNCTIONAL TESTS:  Initial eval: 5 times sit to stand: 20.10 sec Timed up and go (TUG): 12.01 sec  11/25/22: 5 times sit to stand: 12.74 sec Timed up and go (TUG): 9.30 sec  GAIT: Distance walked: 30 Assistive device utilized: None Level of assistance: Modified independence Comments: antalgic  TODAY'S TREATMENT:    DATE: 01/01/23 Nustep x 6 min level 5 Lateral band walks with yellow band x 3 laps  Seated clam with yellow loop x 20 Supine clam x 20 with yellow loop Side lying clam with yellow loop x 20 Supine bridge x 20 Seated LAQ x 20 with 3lb Seated hip ER with 3 lbs x 20 each Side lying hip abduction with 3 lbs 2 x 10 each LE  DATE: 12/30/22 Nustep x 6 min level 5 Lateral band walks with yellow band x 3 laps  Seated clam with yellow loop x 20 Seated LAQ x 20 with 3lb Seated hip ER with 3 lbs x 20 each Side lying hip abduction with 3 lbs 2 x 10 each LE Supine bridge x 20 Supine clam x 20 with yellow loop Side lying clam with yellow loop x 20 Quadruped donkey kicks with medium resistance loop (pink)  DATE: 12/24/22 Nustep x 5 min level 5 1 D ball toss 3 x 10 Seated clam with blue loop x 20 Seated hip adduction with purple ball x 20 Lateral band walks with yellow band x 5 laps in // bars Sit to stand in tandems without UE assist x 10 each LE Gait  training with emphasis on level pelvis and proper heel to toe progression   PATIENT EDUCATION:  Education details: Initiated HEP and educated on anatomy of the hip including labrum and possibility of labral involvement in the hip Person educated: Patient Education method: Programmer, multimedia, Demonstration, Verbal cues, and Handouts Education comprehension: verbalized understanding, returned demonstration, and verbal cues required  HOME EXERCISE PROGRAM: Access Code: [email or web address] URL: [email or web address] Date: 10/29/2022 Prepared by: Mikey Kirschner  Exercises - Supine Hamstring Stretch with Strap   - 1 x daily - 7 x weekly - 1 sets - 3 reps - 30 hold - Supine ITB Stretch with Strap  - 1 x daily - 7 x weekly - 1 sets - 3 reps - 30 hold - Supine Figure 4 Piriformis Stretch  - 1 x daily - 7 x weekly - 1 sets - 3 reps - 30 hold - Quadricep Stretch with Chair and Counter Support  - 1 x daily - 7 x weekly - 1 sets - 3 reps - 30 sec hold - Supine Butterfly Groin Stretch  - 1 x daily - 7 x weekly - 1 sets - 5 reps - 30 sec hold - Supine Posterior Pelvic Tilt  - 1 x daily - 7 x weekly - 2 sets - 10 reps - Supine Bridge  - 1 x daily - 7 x weekly - 2 sets - 10 reps - Hooklying Clamshell with Resistance  - 1 x daily - 7 x weekly - 2 sets - 10 reps - Clamshell with Resistance  - 1 x daily - 7 x weekly - 2 sets - 10 reps - Sidelying Hip Abduction  - 1 x daily - 7 x weekly - 2 sets - 10 reps - Side Stepping with Resistance at Ankles  - 1 x daily - 7 x weekly - 3 sets - 10 reps  ASSESSMENT:  CLINICAL IMPRESSION: Jacqueline Jenkins is approaching final goals.  She is compliant and well motivated.  She has the option of transforaminal injection if her pain does not subside.   She would benefit from continuing skilled PT for bilateral LE stretching R>L along with post laminectomy protocol.    OBJECTIVE IMPAIRMENTS: decreased mobility, difficulty walking, decreased ROM, decreased strength, increased fascial restrictions, increased muscle spasms, impaired flexibility, impaired sensation, improper body mechanics, and pain.   ACTIVITY LIMITATIONS: carrying, lifting, bending, sitting, standing, squatting, sleeping, stairs, transfers, bed mobility, bathing, dressing, and hygiene/grooming  PARTICIPATION LIMITATIONS: meal prep, cleaning, laundry, interpersonal relationship, driving, shopping, community activity, yard work, and church  PERSONAL FACTORS: Fitness and 1-2 comorbidities: HTN, Hx breast cx  are also affecting patient's functional outcome.   REHAB POTENTIAL: Fair Patient has multiple orthopedic spine issues over a  20 year time period.  She must care for her disabled spouse but does have help now.   CLINICAL DECISION MAKING: Evolving/moderate complexity  EVALUATION COMPLEXITY: Moderate   GOALS: Goals reviewed with patient? Yes  SHORT TERM GOALS: Target date: 10/29/2022    Pain report to be no greater than 4/10  Baseline: Goal status: MET 12/24/22  2.  Patient will be independent with initial HEP  Baseline:  Goal status: MET   LONG TERM GOALS: Target date: 11/26/2022    Patient to report pain no greater than 2/10  Baseline:  Goal status: MET 12/24/22  2.  Patient to be independent with advanced HEP  Baseline:  Goal status: MET  3.  Radicular symptoms to be centralized Baseline:  Goal status: MET 12/24/22  4.  Patient  to be able to sleep through the night  Baseline:  Goal status: MET  5.  Patient to report 85% improvement in overall symptoms  Baseline:  Goal status: IN PROGRESS  6.  FOTO to improve to 61  and functional tests to improve by 2-3 seconds Baseline: FOTO 41 on eval  5 times sit to stand: 20.10 sec Timed up and go (TUG): 12.01 sec Goal status: IN PROGRESS  PLAN:  PT FREQUENCY: 2x/week  PT DURATION: 8 weeks  PLANNED INTERVENTIONS: Therapeutic exercises, Therapeutic activity, Neuromuscular re-education, Balance training, Gait training, Patient/Family education, Self Care, Joint mobilization, Stair training, DME instructions, Aquatic Therapy, Dry Needling, Electrical stimulation, Spinal mobilization, Cryotherapy, Moist heat, Taping, Traction, Ultrasound, Ionotophoresis 4mg /ml Dexamethasone, Manual therapy, and Re-evaluation.  PLAN FOR NEXT SESSION: Continue Nustep and focus on right hip stability and glut medius strength bilaterally.    Victorino Dike B. Ashford Clouse, PT 01/01/23 3:32 PM  [facility] [address] [place], [place] [address] Phone # [phone] Fax [phone]

## 2023-01-06 ENCOUNTER — Ambulatory Visit: Payer: Medicare Other

## 2023-01-06 DIAGNOSIS — M6281 Muscle weakness (generalized): Secondary | ICD-10-CM | POA: Diagnosis not present

## 2023-01-06 DIAGNOSIS — R293 Abnormal posture: Secondary | ICD-10-CM

## 2023-01-06 DIAGNOSIS — R252 Cramp and spasm: Secondary | ICD-10-CM

## 2023-01-06 DIAGNOSIS — M25551 Pain in right hip: Secondary | ICD-10-CM | POA: Diagnosis not present

## 2023-01-06 DIAGNOSIS — R262 Difficulty in walking, not elsewhere classified: Secondary | ICD-10-CM | POA: Diagnosis not present

## 2023-01-06 DIAGNOSIS — M5416 Radiculopathy, lumbar region: Secondary | ICD-10-CM | POA: Diagnosis not present

## 2023-01-06 NOTE — Therapy (Signed)
OUTPATIENT PHYSICAL THERAPY THORACOLUMBAR TREATMENT NOTE      Patient Name: Jacqueline Jenkins MRN: [id number] DOB:1950/10/27, 72 y.o., female Today's Date: 01/06/2023  END OF SESSION:  PT End of Session - 01/06/23 1451     Visit Number 16    Date for PT Re-Evaluation 01/20/23    Authorization Type MEDICARE PART A AND B    Progress Note Due on Visit 20    PT Start Time 1445    PT Stop Time 1529    PT Time Calculation (min) 44 min    Activity Tolerance Patient tolerated treatment well    Behavior During Therapy WFL for tasks assessed/performed              Past Medical History:  Diagnosis Date   Allergic rhinitis, cause unspecified    Anemia    history of anemia while teenager   BRCA2 positive    Breast cancer (HCC) 2016   Left Breast Cancer   Breast cancer of upper-outer quadrant of left female breast (HCC)    chemo complete 01/2016, radiation 04/2016   DDD (degenerative disc disease)    Diverticulitis    Family history of breast cancer 08/30/2015   Dx. In maternal grandmother in her 74s-40; dx. In maternal first cousin in her 16s-60s    Family history of colon cancer 08/30/2015   Dx. In maternal uncle in his 33s    Hyperlipidemia    Hypertension    Left kidney mass 04/06/2018   Mild growth from Ct 2013 to 04/06/2018; Renal US 2020 no growth- will not follow up   Neuropathy    Osteopenia 12/2011   Spine T -0.4, Femur -2.1   Personal history of chemotherapy 2016   Left Breast Cancer   Personal history of radiation therapy 2016   Left Breast Cancer   PONV (postoperative nausea and vomiting)    Splenic artery aneurysm (HCC) 2013   COILS DONE   Vitamin D deficiency    Past Surgical History:  Procedure Laterality Date   BREAST LUMPECTOMY Left 2016   BREAST LUMPECTOMY WITH RADIOACTIVE SEED AND SENTINEL LYMPH NODE BIOPSY Left 08/29/2015   Procedure: LEFT BREAST LUMPECTOMY WITH RADIOACTIVE SEED WITH AXILLARY SENTINEL LYMPH NODE BIOPSY;  Surgeon: Glenna Fellows, MD;   Location: [facility];  Service: General;  Laterality: Left;   CERVICAL DISC SURGERY  1998   COLONOSCOPY  Jan. 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   Dural AV fistula repair   IR ANGIO EXTERNAL CAROTID SEL EXT CAROTID BILAT MOD SED  02/03/2017   IR ANGIO EXTERNAL CAROTID SEL EXT CAROTID UNI R MOD SED  03/05/2017   IR ANGIO INTRA EXTRACRAN SEL COM CAROTID INNOMINATE UNI R MOD SED  03/05/2017   IR ANGIO INTRA EXTRACRAN SEL INTERNAL CAROTID BILAT MOD SED  02/03/2017   IR ANGIO VERTEBRAL SEL VERTEBRAL BILAT MOD SED  02/03/2017   IR ANGIOGRAM FOLLOW UP STUDY  03/05/2017   IR NEURO EACH ADD'L AFTER BASIC UNI RIGHT (MS)  02/03/2017   IR NEURO EACH ADD'L AFTER BASIC UNI RIGHT (MS)  03/05/2017   IR TRANSCATH/EMBOLIZ  03/05/2017   LAPAROSCOPIC BILATERAL SALPINGO OOPHERECTOMY Bilateral 05/16/2016   Procedure: LAPAROSCOPIC BILATERAL SALPINGO OOPHORECTOMY;  Surgeon: Huel Cote, MD;  Location: [facility];  Service: Gynecology;  Laterality: Bilateral;   LUMBAR DISC SURGERY  2001, 2010   PORTACATH PLACEMENT Bilateral 10/09/2015   Procedure: INSERTION PORT-A-CATH;  Surgeon: Glenna Fellows, MD;  Location: [facility];  Service: General;  Laterality: Bilateral;   Portacath removal     RADIOLOGY WITH ANESTHESIA N/A 03/05/2017   Procedure: ARTERIOGRAM ONYX EMBOLIZATION OF FISTULA;  Surgeon: Lisbeth Renshaw, MD;  Location: [facility];  Service: Radiology;  Laterality: N/A;   RE-EXCISION OF BREAST LUMPECTOMY Left 09/07/2015   Procedure: RE-EXCISION OF LEFT BREAST LUMPECTOMY;  Surgeon: Glenna Fellows, MD;  Location: [facility];  Service: General;  Laterality: Left;   SPINE SURGERY  1998   cervical diskectomy   SPINE SURGERY  2001, 2010   Lumbar diskectomy   splenic aneurysm  02/10/2012   Aneurysm of splenic artery, coil placed, Dr. 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   Genetic testing 09/19/2015   BRCA2 positive 09/19/2015   Breast cancer of upper-outer quadrant of left female breast (HCC) 08/21/2015   Medication management 06/07/2015   Essential hypertension 06/07/2015   Abnormal glucose 11/16/2013   Allergic rhinitis    Hyperlipidemia, mixed    Lumbar degenerative disc disease    Vitamin D deficiency    Aneurysm of splenic artery (HCC) 01/19/2012    PCP: Lucky Cowboy, MD  REFERRING PROVIDER: Barnett Abu, MD  REFERRING DIAG: M54.16 (ICD-10-CM) - Radiculopathy, lumbar region  Rationale for Evaluation and Treatment: Rehabilitation  THERAPY DIAG:  Pain in right hip  Difficulty in walking, not elsewhere classified  Cramp and spasm  Muscle weakness (generalized)  Abnormal posture  ONSET DATE: 09/30/2022  SUBJECTIVE:                                                                                                                                                                                           SUBJECTIVE STATEMENT: Pt states she is feeling a little stiff today.                  PERTINENT HISTORY:  Lumbar laminectomy 09-02-22 (3rd lumbar surgery)  PAIN:  12/30/22: Are you having pain? No and Yes: NPRS scale: I don't really have pain, the right hip and thigh are just numb 0/10  PRECAUTIONS: Back  WEIGHT BEARING RESTRICTIONS: No  FALLS:  Has patient fallen in last 6 months? No  LIVING ENVIRONMENT: Lives with: lives with their spouse Lives  in: House/apartment  OCCUPATION: Retired  PLOF: Independent, Independent with basic ADLs, Independent with household mobility without device, Independent with  community mobility without device, Independent with homemaking with ambulation, Independent with gait, and Independent with transfers  PATIENT GOALS: to eliminate hip pain so that she can properly recover from her back surgery  NEXT MD VISIT:   OBJECTIVE:   DIAGNOSTIC FINDINGS: 07/11/22 IMPRESSION: 1. Lumbar spondylosis and degenerative disc disease, causing prominent impingement at L3-4; moderate impingement at L4-5 and L5-S1; and 3 mm degenerative anterolisthesis at L4-5 and L5-S1. 2. The dominant finding on today's exam is the right lateral recess and foraminal disc protrusion at L3-4 contributing substantially to the prominent right central and subarticular lateral recess stenosis at this level.  PATIENT SURVEYS:  Initial eval: FOTO 41 (predicted 51) 11/25/22:   42  SCREENING FOR RED FLAGS: Bowel or bladder incontinence: No Spinal tumors: No Cauda equina syndrome: No Compression fracture: No Abdominal aneurysm: No  COGNITION: Overall cognitive status: Within functional limits for tasks assessed     SENSATION: Mild L3-L4 radicular symptoms linger in the right thigh  MUSCLE LENGTH: Hamstrings: Right 50 deg; Left 60 deg (generally about 5-10 degrees improvement) Thomas test: Right pos ; Left pos   POSTURE: decreased lumbar lordosis and posterior pelvic tilt  LUMBAR ROM:  Deferred due to 4 weeks post op: complete this at 8 weeks AROM eval 11/25/22  Flexion NT Fingertips to ankle  Extension  50%  Right lateral flexion  Fingertips to joint line  Left lateral flexion  Fingertips to joint line  Right rotation  25%  Left rotation  25%   (Blank rows = not tested)  LOWER EXTREMITY ROM:     WFL  LOWER EXTREMITY MMT:    Generally 4/5 right LE and 4+/5 left LE LUMBAR SPECIAL TESTS:  See diagnostic findings  FUNCTIONAL TESTS:  Initial eval: 5 times sit to stand: 20.10 sec Timed up and go (TUG): 12.01 sec  11/25/22: 5 times sit to stand: 12.74 sec Timed up and go  (TUG): 9.30 sec  GAIT: Distance walked: 30 Assistive device utilized: None Level of assistance: Modified independence Comments: antalgic  TODAY'S TREATMENT:    DATE: 01/01/23 Nustep x 6 min level 5 Lateral band walks with yellow band x 3 laps  Seated clam with yellow loop x 20 Supine clam x 20 with yellow loop Side lying clam with yellow loop x 20 Supine bridge x 20 Supine single leg bridge 2 x 10 each LE Seated LAQ x 20 with 3lb Attempted planks in prone, then off edge of table, then at counter (patient struggled with all) but was able to do about 10 seconds at counter Seated hip ER with 3 lbs x 20 each Side lying hip abduction with 3 lbs 2 x 10 each LE Sit to stand in tandem 2 x 10 each side  DATE: 12/30/22 Nustep x 6 min level 5 Lateral band walks with yellow band x 3 laps  Seated clam with yellow loop x 20 Seated LAQ x 20 with 3lb Seated hip ER with 3 lbs x 20 each Side lying hip abduction with 3 lbs 2 x 10 each LE Supine bridge x 20 Supine clam x 20 with yellow loop Side lying clam with yellow loop x 20 Quadruped donkey kicks with medium resistance loop (pink)  DATE: 12/24/22 Nustep x 5 min level 5 1 D ball toss 3 x 10 Seated clam with blue loop x 20 Seated hip adduction with purple ball  x 20 Lateral band walks with yellow band x 5 laps in // bars Sit to stand in tandems without UE assist x 10 each LE Gait training with emphasis on level pelvis and proper heel to toe progression   PATIENT EDUCATION:  Education details: Initiated HEP and educated on anatomy of the hip including labrum and possibility of labral involvement in the hip Person educated: Patient Education method: Programmer, multimedia, Demonstration, Verbal cues, and Handouts Education comprehension: verbalized understanding, returned demonstration, and verbal cues required  HOME EXERCISE PROGRAM: Access Code: [email or web address] URL: [email or web address] Date: 10/29/2022 Prepared by: Mikey Kirschner  Exercises - Supine Hamstring Stretch with Strap  - 1 x daily - 7 x weekly - 1 sets - 3 reps - 30 hold - Supine ITB Stretch with Strap  - 1 x daily - 7 x weekly - 1 sets - 3 reps - 30 hold - Supine Figure 4 Piriformis Stretch  - 1 x daily - 7 x weekly - 1 sets - 3 reps - 30 hold - Quadricep Stretch with Chair and Counter Support  - 1 x daily - 7 x weekly - 1 sets - 3 reps - 30 sec hold - Supine Butterfly Groin Stretch  - 1 x daily - 7 x weekly - 1 sets - 5 reps - 30 sec hold - Supine Posterior Pelvic Tilt  - 1 x daily - 7 x weekly - 2 sets - 10 reps - Supine Bridge  - 1 x daily - 7 x weekly - 2 sets - 10 reps - Hooklying Clamshell with Resistance  - 1 x daily - 7 x weekly - 2 sets - 10 reps - Clamshell with Resistance  - 1 x daily - 7 x weekly - 2 sets - 10 reps - Sidelying Hip Abduction  - 1 x daily - 7 x weekly - 2 sets - 10 reps - Side Stepping with Resistance at Ankles  - 1 x daily - 7 x weekly - 3 sets - 10 reps  ASSESSMENT:  CLINICAL IMPRESSION: Lorne continues to progress and is tolerating higher level strengthening.  She is compliant and well motivated.  She has the option of transforaminal injection if her pain does not subside.   She would benefit from continuing skilled PT for bilateral LE stretching R>L along with post laminectomy protocol.    OBJECTIVE IMPAIRMENTS: decreased mobility, difficulty walking, decreased ROM, decreased strength, increased fascial restrictions, increased muscle spasms, impaired flexibility, impaired sensation, improper body mechanics, and pain.   ACTIVITY LIMITATIONS: carrying, lifting, bending, sitting, standing, squatting, sleeping, stairs, transfers, bed mobility, bathing, dressing, and hygiene/grooming  PARTICIPATION LIMITATIONS: meal prep, cleaning, laundry, interpersonal relationship, driving, shopping, community activity, yard work, and church  PERSONAL FACTORS: Fitness and 1-2 comorbidities: HTN, Hx breast cx  are also affecting  patient's functional outcome.   REHAB POTENTIAL: Fair Patient has multiple orthopedic spine issues over a 20 year time period.  She must care for her disabled spouse but does have help now.   CLINICAL DECISION MAKING: Evolving/moderate complexity  EVALUATION COMPLEXITY: Moderate   GOALS: Goals reviewed with patient? Yes  SHORT TERM GOALS: Target date: 10/29/2022    Pain report to be no greater than 4/10  Baseline: Goal status: MET 12/24/22  2.  Patient will be independent with initial HEP  Baseline:  Goal status: MET   LONG TERM GOALS: Target date: 11/26/2022    Patient to report pain no greater than 2/10  Baseline:  Goal status: MET 12/24/22  2.  Patient to be independent with advanced HEP  Baseline:  Goal status: MET  3.  Radicular symptoms to be centralized Baseline:  Goal status: MET 12/24/22  4.  Patient to be able to sleep through the night  Baseline:  Goal status: MET  5.  Patient to report 85% improvement in overall symptoms  Baseline:  Goal status: IN PROGRESS  6.  FOTO to improve to 61  and functional tests to improve by 2-3 seconds Baseline: FOTO 41 on eval  5 times sit to stand: 20.10 sec Timed up and go (TUG): 12.01 sec Goal status: IN PROGRESS  PLAN:  PT FREQUENCY: 2x/week  PT DURATION: 8 weeks  PLANNED INTERVENTIONS: Therapeutic exercises, Therapeutic activity, Neuromuscular re-education, Balance training, Gait training, Patient/Family education, Self Care, Joint mobilization, Stair training, DME instructions, Aquatic Therapy, Dry Needling, Electrical stimulation, Spinal mobilization, Cryotherapy, Moist heat, Taping, Traction, Ultrasound, Ionotophoresis 4mg /ml Dexamethasone, Manual therapy, and Re-evaluation.  PLAN FOR NEXT SESSION: Continue current treatment plan until end of cert period.  Nustep and focus on right hip stability and glut medius strength bilaterally.    Victorino Dike B. Ahsley Attwood, PT 01/06/23 3:31 PM  [facility]  Services [address], [address] [place], [place] [address] Phone # [phone] Fax [phone]

## 2023-01-08 ENCOUNTER — Ambulatory Visit: Payer: Medicare Other

## 2023-01-08 DIAGNOSIS — M25551 Pain in right hip: Secondary | ICD-10-CM | POA: Diagnosis not present

## 2023-01-08 DIAGNOSIS — M6281 Muscle weakness (generalized): Secondary | ICD-10-CM | POA: Diagnosis not present

## 2023-01-08 DIAGNOSIS — R293 Abnormal posture: Secondary | ICD-10-CM

## 2023-01-08 DIAGNOSIS — R262 Difficulty in walking, not elsewhere classified: Secondary | ICD-10-CM

## 2023-01-08 DIAGNOSIS — M5416 Radiculopathy, lumbar region: Secondary | ICD-10-CM | POA: Diagnosis not present

## 2023-01-08 DIAGNOSIS — R252 Cramp and spasm: Secondary | ICD-10-CM

## 2023-01-08 NOTE — Therapy (Signed)
OUTPATIENT PHYSICAL THERAPY THORACOLUMBAR TREATMENT NOTE      Patient Name: Jacqueline Jenkins MRN: [id number] DOB:12-Aug-1951, 72 y.o., female Today's Date: 01/09/2023  END OF SESSION:  PT End of Session - 01/08/23 1455     Visit Number 17    Date for PT Re-Evaluation 01/20/23    Authorization Type MEDICARE PART A AND B    Progress Note Due on Visit 20    PT Start Time 1446    PT Stop Time 1530    PT Time Calculation (min) 44 min    Activity Tolerance Patient tolerated treatment well    Behavior During Therapy WFL for tasks assessed/performed              Past Medical History:  Diagnosis Date   Allergic rhinitis, cause unspecified    Anemia    history of anemia while teenager   BRCA2 positive    Breast cancer (HCC) 2016   Left Breast Cancer   Breast cancer of upper-outer quadrant of left female breast (HCC)    chemo complete 01/2016, radiation 04/2016   DDD (degenerative disc disease)    Diverticulitis    Family history of breast cancer 08/30/2015   Dx. In maternal grandmother in her 83s-40; dx. In maternal first cousin in her 75s-60s    Family history of colon cancer 08/30/2015   Dx. In maternal uncle in his 53s    Hyperlipidemia    Hypertension    Left kidney mass 04/06/2018   Mild growth from Ct 2013 to 04/06/2018; Renal US 2020 no growth- will not follow up   Neuropathy    Osteopenia 12/2011   Spine T -0.4, Femur -2.1   Personal history of chemotherapy 2016   Left Breast Cancer   Personal history of radiation therapy 2016   Left Breast Cancer   PONV (postoperative nausea and vomiting)    Splenic artery aneurysm (HCC) 2013   COILS DONE   Vitamin D deficiency    Past Surgical History:  Procedure Laterality Date   BREAST LUMPECTOMY Left 2016   BREAST LUMPECTOMY WITH RADIOACTIVE SEED AND SENTINEL LYMPH NODE BIOPSY Left 08/29/2015   Procedure: LEFT BREAST LUMPECTOMY WITH RADIOACTIVE SEED WITH AXILLARY SENTINEL LYMPH NODE BIOPSY;  Surgeon: Glenna Fellows, MD;   Location: [facility];  Service: General;  Laterality: Left;   CERVICAL DISC SURGERY  1998   COLONOSCOPY  Jan. 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   Dural AV fistula repair   IR ANGIO EXTERNAL CAROTID SEL EXT CAROTID BILAT MOD SED  02/03/2017   IR ANGIO EXTERNAL CAROTID SEL EXT CAROTID UNI R MOD SED  03/05/2017   IR ANGIO INTRA EXTRACRAN SEL COM CAROTID INNOMINATE UNI R MOD SED  03/05/2017   IR ANGIO INTRA EXTRACRAN SEL INTERNAL CAROTID BILAT MOD SED  02/03/2017   IR ANGIO VERTEBRAL SEL VERTEBRAL BILAT MOD SED  02/03/2017   IR ANGIOGRAM FOLLOW UP STUDY  03/05/2017   IR NEURO EACH ADD'L AFTER BASIC UNI RIGHT (MS)  02/03/2017   IR NEURO EACH ADD'L AFTER BASIC UNI RIGHT (MS)  03/05/2017   IR TRANSCATH/EMBOLIZ  03/05/2017   LAPAROSCOPIC BILATERAL SALPINGO OOPHERECTOMY Bilateral 05/16/2016   Procedure: LAPAROSCOPIC BILATERAL SALPINGO OOPHORECTOMY;  Surgeon: Huel Cote, MD;  Location: [facility];  Service: Gynecology;  Laterality: Bilateral;   LUMBAR DISC SURGERY  2001, 2010   PORTACATH PLACEMENT Bilateral 10/09/2015   Procedure: INSERTION PORT-A-CATH;  Surgeon: Glenna Fellows, MD;  Location: [facility];  Service: General;  Laterality: Bilateral;   Portacath removal     RADIOLOGY WITH ANESTHESIA N/A 03/05/2017   Procedure: ARTERIOGRAM ONYX EMBOLIZATION OF FISTULA;  Surgeon: Lisbeth Renshaw, MD;  [facility];  Service: Radiology;  Laterality: N/A;   RE-EXCISION OF BREAST LUMPECTOMY Left 09/07/2015   Procedure: RE-EXCISION OF LEFT BREAST LUMPECTOMY;  Surgeon: Glenna Fellows, MD;  Location: [facility];  Service: General;  Laterality: Left;   SPINE SURGERY  1998   cervical diskectomy   SPINE SURGERY  2001, 2010   Lumbar diskectomy   splenic aneurysm  02/10/2012   Aneurysm of splenic artery, coil placed, Dr. 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   Genetic testing 09/19/2015   BRCA2 positive 09/19/2015   Breast cancer of upper-outer quadrant of left female breast (HCC) 08/21/2015   Medication management 06/07/2015   Essential hypertension 06/07/2015   Abnormal glucose 11/16/2013   Allergic rhinitis    Hyperlipidemia, mixed    Lumbar degenerative disc disease    Vitamin D deficiency    Aneurysm of splenic artery (HCC) 01/19/2012    PCP: Lucky Cowboy, MD  REFERRING PROVIDER: Barnett Abu, MD  REFERRING DIAG: M54.16 (ICD-10-CM) - Radiculopathy, lumbar region  Rationale for Evaluation and Treatment: Rehabilitation  THERAPY DIAG:  Pain in right hip  Difficulty in walking, not elsewhere classified  Cramp and spasm  Muscle weakness (generalized)  Radiculopathy, lumbar region  Abnormal posture  ONSET DATE: 09/30/2022  SUBJECTIVE:                                                                                                                                                                                           SUBJECTIVE STATEMENT: Pt states right hip is about the same but overall it is remarkably better than when I started PT.                    PERTINENT HISTORY:  Lumbar laminectomy 09-02-22 (3rd lumbar surgery)  PAIN:  01/08/23: Are you having pain? No and Yes: NPRS scale: I don't really have pain, the right hip and thigh are just numb 0/10  PRECAUTIONS: Back  WEIGHT BEARING RESTRICTIONS: No  FALLS:  Has patient  fallen in last 6 months? No  LIVING ENVIRONMENT: Lives with: lives with their spouse Lives in: House/apartment  OCCUPATION: Retired  PLOF: Independent, Independent with  basic ADLs, Independent with household mobility without device, Independent with community mobility without device, Independent with homemaking with ambulation, Independent with gait, and Independent with transfers  PATIENT GOALS: to eliminate hip pain so that she can properly recover from her back surgery  NEXT MD VISIT:   OBJECTIVE:   DIAGNOSTIC FINDINGS: 07/11/22 IMPRESSION: 1. Lumbar spondylosis and degenerative disc disease, causing prominent impingement at L3-4; moderate impingement at L4-5 and L5-S1; and 3 mm degenerative anterolisthesis at L4-5 and L5-S1. 2. The dominant finding on today's exam is the right lateral recess and foraminal disc protrusion at L3-4 contributing substantially to the prominent right central and subarticular lateral recess stenosis at this level.  PATIENT SURVEYS:  Initial eval: FOTO 41 (predicted 51) 11/25/22:   42  SCREENING FOR RED FLAGS: Bowel or bladder incontinence: No Spinal tumors: No Cauda equina syndrome: No Compression fracture: No Abdominal aneurysm: No  COGNITION: Overall cognitive status: Within functional limits for tasks assessed     SENSATION: Mild L3-L4 radicular symptoms linger in the right thigh  MUSCLE LENGTH: Hamstrings: Right 50 deg; Left 60 deg (generally about 5-10 degrees improvement) Thomas test: Right pos ; Left pos   POSTURE: decreased lumbar lordosis and posterior pelvic tilt  LUMBAR ROM:  Deferred due to 4 weeks post op: complete this at 8 weeks AROM eval 11/25/22  Flexion NT Fingertips to ankle  Extension  50%  Right lateral flexion  Fingertips to joint line  Left lateral flexion  Fingertips to joint line  Right rotation  25%  Left rotation  25%   (Blank rows = not tested)  LOWER EXTREMITY ROM:     WFL  LOWER EXTREMITY MMT:    Generally 4/5 right LE and 4+/5 left LE LUMBAR SPECIAL TESTS:  See diagnostic findings  FUNCTIONAL TESTS:  Initial eval: 5 times sit to stand: 20.10 sec Timed up and  go (TUG): 12.01 sec  11/25/22: 5 times sit to stand: 12.74 sec Timed up and go (TUG): 9.30 sec  GAIT: Distance walked: 30 Assistive device utilized: None Level of assistance: Modified independence Comments: antalgic  TODAY'S TREATMENT:    DATE: 01/08/23 Nustep x 6 min level 5 Lateral band walks with yellow band x 3 laps  Seated clam with yellow loop x 20 Supine clam x 20 with yellow loop Side lying clam with yellow loop x 20 Quadruped fire hydrant with yellow loop Side lying hip abduction with 3 lbs 2 x 10 each LE Prone hip extension with 3 lbs 2 x 10  Gait training with emphasis on controlling trendelenburg  Cone touches x 10 each LE  DATE: 01/01/23 Nustep x 6 min level 5 Lateral band walks with yellow band x 3 laps  Supine clam x 20 with yellow loop Side lying clam with yellow loop x 20 Supine bridge x 20 Supine single leg bridge 2 x 10 each LE Seated LAQ x 20 with 3lb Attempted planks in prone, then off edge of table, then at counter (patient struggled with all) but was able to do about 10 seconds at counter Seated hip ER with 3 lbs x 20 each Side lying hip abduction with 3 lbs 2 x 10 each LE Sit to stand in tandem 2 x 10 each side  DATE: 12/30/22 Nustep x 6 min level 5 Lateral band walks with yellow band x 3  laps  Seated clam with yellow loop x 20 Seated LAQ x 20 with 3lb Seated hip ER with 3 lbs x 20 each Side lying hip abduction with 3 lbs 2 x 10 each LE Supine bridge x 20 Supine clam x 20 with yellow loop Side lying clam with yellow loop x 20 Quadruped donkey kicks with medium resistance loop (pink)   PATIENT EDUCATION:  Education details: Initiated HEP and educated on anatomy of the hip including labrum and possibility of labral involvement in the hip Person educated: Patient Education method: Programmer, multimedia, Facilities manager, Verbal cues, and Handouts Education comprehension: verbalized understanding, returned demonstration, and verbal cues required  HOME  EXERCISE PROGRAM: Access Code: [email or web address] URL: [email or web address] Date: 10/29/2022 Prepared by: Mikey Kirschner  Exercises - Supine Hamstring Stretch with Strap  - 1 x daily - 7 x weekly - 1 sets - 3 reps - 30 hold - Supine ITB Stretch with Strap  - 1 x daily - 7 x weekly - 1 sets - 3 reps - 30 hold - Supine Figure 4 Piriformis Stretch  - 1 x daily - 7 x weekly - 1 sets - 3 reps - 30 hold - Quadricep Stretch with Chair and Counter Support  - 1 x daily - 7 x weekly - 1 sets - 3 reps - 30 sec hold - Supine Butterfly Groin Stretch  - 1 x daily - 7 x weekly - 1 sets - 5 reps - 30 sec hold - Supine Posterior Pelvic Tilt  - 1 x daily - 7 x weekly - 2 sets - 10 reps - Supine Bridge  - 1 x daily - 7 x weekly - 2 sets - 10 reps - Hooklying Clamshell with Resistance  - 1 x daily - 7 x weekly - 2 sets - 10 reps - Clamshell with Resistance  - 1 x daily - 7 x weekly - 2 sets - 10 reps - Sidelying Hip Abduction  - 1 x daily - 7 x weekly - 2 sets - 10 reps - Side Stepping with Resistance at Ankles  - 1 x daily - 7 x weekly - 3 sets - 10 reps  ASSESSMENT:  CLINICAL IMPRESSION: Jacqueline Jenkins is progressing appropriately.  She has made marked improvement with overall strength but continues to have trendelenburg gait (right glut med weakness).  We added quadruped fire hydrant bilaterally.  She was able to do this with good form x 10.    She would benefit from continuing skilled PT for bilateral LE stretching R>L along with post laminectomy protocol.    OBJECTIVE IMPAIRMENTS: decreased mobility, difficulty walking, decreased ROM, decreased strength, increased fascial restrictions, increased muscle spasms, impaired flexibility, impaired sensation, improper body mechanics, and pain.   ACTIVITY LIMITATIONS: carrying, lifting, bending, sitting, standing, squatting, sleeping, stairs, transfers, bed mobility, bathing, dressing, and hygiene/grooming  PARTICIPATION LIMITATIONS: meal prep, cleaning, laundry,  interpersonal relationship, driving, shopping, community activity, yard work, and church  PERSONAL FACTORS: Fitness and 1-2 comorbidities: HTN, Hx breast cx  are also affecting patient's functional outcome.   REHAB POTENTIAL: Fair Patient has multiple orthopedic spine issues over a 20 year time period.  She must care for her disabled spouse but does have help now.   CLINICAL DECISION MAKING: Evolving/moderate complexity  EVALUATION COMPLEXITY: Moderate   GOALS: Goals reviewed with patient? Yes  SHORT TERM GOALS: Target date: 10/29/2022    Pain report to be no greater than 4/10  Baseline: Goal status: MET 12/24/22  2.  Patient will be independent with  initial HEP  Baseline:  Goal status: MET   LONG TERM GOALS: Target date: 11/26/2022    Patient to report pain no greater than 2/10  Baseline:  Goal status: MET 12/24/22  2.  Patient to be independent with advanced HEP  Baseline:  Goal status: MET  3.  Radicular symptoms to be centralized Baseline:  Goal status: MET 12/24/22  4.  Patient to be able to sleep through the night  Baseline:  Goal status: MET  5.  Patient to report 85% improvement in overall symptoms  Baseline:  Goal status: IN PROGRESS  6.  FOTO to improve to 61  and functional tests to improve by 2-3 seconds Baseline: FOTO 41 on eval  5 times sit to stand: 20.10 sec Timed up and go (TUG): 12.01 sec Goal status: IN PROGRESS  PLAN:  PT FREQUENCY: 2x/week  PT DURATION: 8 weeks  PLANNED INTERVENTIONS: Therapeutic exercises, Therapeutic activity, Neuromuscular re-education, Balance training, Gait training, Patient/Family education, Self Care, Joint mobilization, Stair training, DME instructions, Aquatic Therapy, Dry Needling, Electrical stimulation, Spinal mobilization, Cryotherapy, Moist heat, Taping, Traction, Ultrasound, Ionotophoresis 4mg /ml Dexamethasone, Manual therapy, and Re-evaluation.  PLAN FOR NEXT SESSION: Gait training.  Continue current  treatment plan until end of cert period.  Nustep and focus on right hip stability and glut medius strength bilaterally.    Victorino Dike B. Daysi Boggan, PT 01/09/23 7:28 AM  [facility] [address], [address] [place], [place] [address] Phone # [phone] Fax [phone]

## 2023-01-13 ENCOUNTER — Ambulatory Visit: Payer: Medicare Other

## 2023-01-13 DIAGNOSIS — M25551 Pain in right hip: Secondary | ICD-10-CM | POA: Diagnosis not present

## 2023-01-13 DIAGNOSIS — R262 Difficulty in walking, not elsewhere classified: Secondary | ICD-10-CM | POA: Diagnosis not present

## 2023-01-13 DIAGNOSIS — R293 Abnormal posture: Secondary | ICD-10-CM | POA: Diagnosis not present

## 2023-01-13 DIAGNOSIS — R252 Cramp and spasm: Secondary | ICD-10-CM | POA: Diagnosis not present

## 2023-01-13 DIAGNOSIS — M6281 Muscle weakness (generalized): Secondary | ICD-10-CM

## 2023-01-13 DIAGNOSIS — M5416 Radiculopathy, lumbar region: Secondary | ICD-10-CM

## 2023-01-13 NOTE — Therapy (Signed)
OUTPATIENT PHYSICAL THERAPY THORACOLUMBAR TREATMENT NOTE      Patient Name: Jacqueline Jenkins MRN: [id number] DOB:July 18, 1951, 72 y.o., female Today's Date: 01/14/2023  END OF SESSION:  PT End of Session - 01/13/23 1503     Visit Number 18    Date for PT Re-Evaluation 01/20/23    Authorization Type MEDICARE PART A AND B    Progress Note Due on Visit 20    PT Start Time 1445    PT Stop Time 1525    PT Time Calculation (min) 40 min    Activity Tolerance Patient tolerated treatment well    Behavior During Therapy WFL for tasks assessed/performed              Past Medical History:  Diagnosis Date   Allergic rhinitis, cause unspecified    Anemia    history of anemia while teenager   BRCA2 positive    Breast cancer (HCC) 2016   Left Breast Cancer   Breast cancer of upper-outer quadrant of left female breast (HCC)    chemo complete 01/2016, radiation 04/2016   DDD (degenerative disc disease)    Diverticulitis    Family history of breast cancer 08/30/2015   Dx. In maternal grandmother in her 2s-40; dx. In maternal first cousin in her 67s-60s    Family history of colon cancer 08/30/2015   Dx. In maternal uncle in his 30s    Hyperlipidemia    Hypertension    Left kidney mass 04/06/2018   Mild growth from Ct 2013 to 04/06/2018; Renal US 2020 no growth- will not follow up   Neuropathy    Osteopenia 12/2011   Spine T -0.4, Femur -2.1   Personal history of chemotherapy 2016   Left Breast Cancer   Personal history of radiation therapy 2016   Left Breast Cancer   PONV (postoperative nausea and vomiting)    Splenic artery aneurysm (HCC) 2013   COILS DONE   Vitamin D deficiency    Past Surgical History:  Procedure Laterality Date   BREAST LUMPECTOMY Left 2016   BREAST LUMPECTOMY WITH RADIOACTIVE SEED AND SENTINEL LYMPH NODE BIOPSY Left 08/29/2015   Procedure: LEFT BREAST LUMPECTOMY WITH RADIOACTIVE SEED WITH AXILLARY SENTINEL LYMPH NODE BIOPSY;  Surgeon: Glenna Fellows, MD;   Location: [facility];  Service: General;  Laterality: Left;   CERVICAL DISC SURGERY  1998   COLONOSCOPY  Jan. 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   Dural AV fistula repair   IR ANGIO EXTERNAL CAROTID SEL EXT CAROTID BILAT MOD SED  02/03/2017   IR ANGIO EXTERNAL CAROTID SEL EXT CAROTID UNI R MOD SED  03/05/2017   IR ANGIO INTRA EXTRACRAN SEL COM CAROTID INNOMINATE UNI R MOD SED  03/05/2017   IR ANGIO INTRA EXTRACRAN SEL INTERNAL CAROTID BILAT MOD SED  02/03/2017   IR ANGIO VERTEBRAL SEL VERTEBRAL BILAT MOD SED  02/03/2017   IR ANGIOGRAM FOLLOW UP STUDY  03/05/2017   IR NEURO EACH ADD'L AFTER BASIC UNI RIGHT (MS)  02/03/2017   IR NEURO EACH ADD'L AFTER BASIC UNI RIGHT (MS)  03/05/2017   IR TRANSCATH/EMBOLIZ  03/05/2017   LAPAROSCOPIC BILATERAL SALPINGO OOPHERECTOMY Bilateral 05/16/2016   Procedure: LAPAROSCOPIC BILATERAL SALPINGO OOPHORECTOMY;  Surgeon: Huel Cote, MD;  Location: [facility];  Service: Gynecology;  Laterality: Bilateral;   LUMBAR DISC SURGERY  2001, 2010   PORTACATH PLACEMENT Bilateral 10/09/2015   Procedure: INSERTION PORT-A-CATH;  Surgeon: Glenna Fellows, MD;  Location: [facility];  Service: General;  Laterality: Bilateral;   Portacath removal     RADIOLOGY WITH ANESTHESIA N/A 03/05/2017   Procedure: ARTERIOGRAM ONYX EMBOLIZATION OF FISTULA;  Surgeon: Lisbeth Renshaw, MD;  Location: [facility] OR;  Service: Radiology;  Laterality: N/A;   RE-EXCISION OF BREAST LUMPECTOMY Left 09/07/2015   Procedure: RE-EXCISION OF LEFT BREAST LUMPECTOMY;  Surgeon: Glenna Fellows, MD;  Location: [facility];  Service: General;  Laterality: Left;   SPINE SURGERY  1998   cervical diskectomy   SPINE SURGERY  2001, 2010   Lumbar diskectomy   splenic aneurysm  02/10/2012   Aneurysm of splenic artery, coil placed, Dr. 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   Genetic testing 09/19/2015   BRCA2 positive 09/19/2015   Breast cancer of upper-outer quadrant of left female breast (HCC) 08/21/2015   Medication management 06/07/2015   Essential hypertension 06/07/2015   Abnormal glucose 11/16/2013   Allergic rhinitis    Hyperlipidemia, mixed    Lumbar degenerative disc disease    Vitamin D deficiency    Aneurysm of splenic artery (HCC) 01/19/2012    PCP: Lucky Cowboy, MD  REFERRING PROVIDER: Barnett Abu, MD  REFERRING DIAG: M54.16 (ICD-10-CM) - Radiculopathy, lumbar region  Rationale for Evaluation and Treatment: Rehabilitation  THERAPY DIAG:  Pain in right hip  Difficulty in walking, not elsewhere classified  Cramp and spasm  Muscle weakness (generalized)  Radiculopathy, lumbar region  Abnormal posture  ONSET DATE: 09/30/2022  SUBJECTIVE:                                                                                                                                                                                           SUBJECTIVE STATEMENT: Pt states "I'm starting to feel like myself again".                    PERTINENT HISTORY:  Lumbar laminectomy 09-02-22 (3rd lumbar surgery)  PAIN:  01/08/23: Are you having pain? No and Yes: NPRS scale: I don't really have pain, the right hip and thigh are just numb 0/10  PRECAUTIONS: Back  WEIGHT BEARING RESTRICTIONS: No  FALLS:  Has patient fallen in last 6 months? No  LIVING ENVIRONMENT: Lives  with: lives with their spouse Lives in: House/apartment  OCCUPATION: Retired  PLOF: Independent, Independent with basic ADLs, Independent with household mobility  without device, Independent with community mobility without device, Independent with homemaking with ambulation, Independent with gait, and Independent with transfers  PATIENT GOALS: to eliminate hip pain so that she can properly recover from her back surgery  NEXT MD VISIT:   OBJECTIVE:   DIAGNOSTIC FINDINGS: 07/11/22 IMPRESSION: 1. Lumbar spondylosis and degenerative disc disease, causing prominent impingement at L3-4; moderate impingement at L4-5 and L5-S1; and 3 mm degenerative anterolisthesis at L4-5 and L5-S1. 2. The dominant finding on today's exam is the right lateral recess and foraminal disc protrusion at L3-4 contributing substantially to the prominent right central and subarticular lateral recess stenosis at this level.  PATIENT SURVEYS:  Initial eval: FOTO 41 (predicted 51) 11/25/22:   42  SCREENING FOR RED FLAGS: Bowel or bladder incontinence: No Spinal tumors: No Cauda equina syndrome: No Compression fracture: No Abdominal aneurysm: No  COGNITION: Overall cognitive status: Within functional limits for tasks assessed     SENSATION: Mild L3-L4 radicular symptoms linger in the right thigh  MUSCLE LENGTH: Hamstrings: Right 50 deg; Left 60 deg (generally about 5-10 degrees improvement) Thomas test: Right pos ; Left pos   POSTURE: decreased lumbar lordosis and posterior pelvic tilt  LUMBAR ROM:  Deferred due to 4 weeks post op: complete this at 8 weeks AROM eval 11/25/22  Flexion NT Fingertips to ankle  Extension  50%  Right lateral flexion  Fingertips to joint line  Left lateral flexion  Fingertips to joint line  Right rotation  25%  Left rotation  25%   (Blank rows = not tested)  LOWER EXTREMITY ROM:     WFL  LOWER EXTREMITY MMT:    Generally 4/5 right LE and 4+/5 left LE LUMBAR SPECIAL TESTS:  See diagnostic findings  FUNCTIONAL TESTS:  Initial eval: 5 times sit to stand: 20.10 sec Timed up and go (TUG): 12.01 sec  11/25/22: 5 times sit to  stand: 12.74 sec Timed up and go (TUG): 9.30 sec  GAIT: Distance walked: 30 Assistive device utilized: None Level of assistance: Modified independence Comments: antalgic  TODAY'S TREATMENT:    DATE: 01/13/23 Nustep x 6 min level 5 Lateral band walks with yellow band x 3 laps  Seated clam with yellow loop x 20 Supine clam x 20 with yellow loop Side lying clam with yellow loop x 20 Bridge with clam x 20 Quadruped fire hydrant with yellow loop 2 x 10 Cone touches x 10 each LE  DATE: 01/08/23 Nustep x 6 min level 5 Lateral band walks with yellow band x 3 laps  Seated clam with yellow loop x 20 Supine clam x 20 with yellow loop Side lying clam with yellow loop x 20 Quadruped fire hydrant with yellow loop Side lying hip abduction with 3 lbs 2 x 10 each LE Prone hip extension with 3 lbs 2 x 10  Gait training with emphasis on controlling trendelenburg  Cone touches x 10 each LE  DATE: 01/01/23 Nustep x 6 min level 5 Lateral band walks with yellow band x 3 laps  Supine clam x 20 with yellow loop Side lying clam with yellow loop x 20 Supine bridge x 20 Supine single leg bridge 2 x 10 each LE Seated LAQ x 20 with 3lb Attempted planks in prone, then off edge of table, then at counter (patient struggled with all) but was able to do about 10  seconds at counter Seated hip ER with 3 lbs x 20 each Side lying hip abduction with 3 lbs 2 x 10 each LE Sit to stand in tandem 2 x 10 each side  DATE: 12/30/22 Nustep x 6 min level 5 Lateral band walks with yellow band x 3 laps  Seated clam with yellow loop x 20 Seated LAQ x 20 with 3lb Seated hip ER with 3 lbs x 20 each Side lying hip abduction with 3 lbs 2 x 10 each LE Supine bridge x 20 Supine clam x 20 with yellow loop Side lying clam with yellow loop x 20 Quadruped donkey kicks with medium resistance loop (pink)   PATIENT EDUCATION:  Education details: Initiated HEP and educated on anatomy of the hip including labrum and  possibility of labral involvement in the hip Person educated: Patient Education method: Programmer, multimedia, Facilities manager, Verbal cues, and Handouts Education comprehension: verbalized understanding, returned demonstration, and verbal cues required  HOME EXERCISE PROGRAM: Access Code: [email or web address] URL: [email or web address] Date: 10/29/2022 Prepared by: Mikey Kirschner  Exercises - Supine Hamstring Stretch with Strap  - 1 x daily - 7 x weekly - 1 sets - 3 reps - 30 hold - Supine ITB Stretch with Strap  - 1 x daily - 7 x weekly - 1 sets - 3 reps - 30 hold - Supine Figure 4 Piriformis Stretch  - 1 x daily - 7 x weekly - 1 sets - 3 reps - 30 hold - Quadricep Stretch with Chair and Counter Support  - 1 x daily - 7 x weekly - 1 sets - 3 reps - 30 sec hold - Supine Butterfly Groin Stretch  - 1 x daily - 7 x weekly - 1 sets - 5 reps - 30 sec hold - Supine Posterior Pelvic Tilt  - 1 x daily - 7 x weekly - 2 sets - 10 reps - Supine Bridge  - 1 x daily - 7 x weekly - 2 sets - 10 reps - Hooklying Clamshell with Resistance  - 1 x daily - 7 x weekly - 2 sets - 10 reps - Clamshell with Resistance  - 1 x daily - 7 x weekly - 2 sets - 10 reps - Sidelying Hip Abduction  - 1 x daily - 7 x weekly - 2 sets - 10 reps - Side Stepping with Resistance at Ankles  - 1 x daily - 7 x weekly - 3 sets - 10 reps  ASSESSMENT:  CLINICAL IMPRESSION: Adrihanna is progressing appropriately.    She would benefit from continuing skilled PT for bilateral LE stretching R>L along with post laminectomy protocol.    OBJECTIVE IMPAIRMENTS: decreased mobility, difficulty walking, decreased ROM, decreased strength, increased fascial restrictions, increased muscle spasms, impaired flexibility, impaired sensation, improper body mechanics, and pain.   ACTIVITY LIMITATIONS: carrying, lifting, bending, sitting, standing, squatting, sleeping, stairs, transfers, bed mobility, bathing, dressing, and hygiene/grooming  PARTICIPATION  LIMITATIONS: meal prep, cleaning, laundry, interpersonal relationship, driving, shopping, community activity, yard work, and church  PERSONAL FACTORS: Fitness and 1-2 comorbidities: HTN, Hx breast cx  are also affecting patient's functional outcome.   REHAB POTENTIAL: Fair Patient has multiple orthopedic spine issues over a 20 year time period.  She must care for her disabled spouse but does have help now.   CLINICAL DECISION MAKING: Evolving/moderate complexity  EVALUATION COMPLEXITY: Moderate   GOALS: Goals reviewed with patient? Yes  SHORT TERM GOALS: Target date: 10/29/2022    Pain report to be no greater than 4/10  Baseline: Goal status: MET 12/24/22  2.  Patient will be independent with initial HEP  Baseline:  Goal status: MET   LONG TERM GOALS: Target date: 11/26/2022    Patient to report pain no greater than 2/10  Baseline:  Goal status: MET 12/24/22  2.  Patient to be independent with advanced HEP  Baseline:  Goal status: MET  3.  Radicular symptoms to be centralized Baseline:  Goal status: MET 12/24/22  4.  Patient to be able to sleep through the night  Baseline:  Goal status: MET  5.  Patient to report 85% improvement in overall symptoms  Baseline:  Goal status: IN PROGRESS  6.  FOTO to improve to 61  and functional tests to improve by 2-3 seconds Baseline: FOTO 41 on eval  5 times sit to stand: 20.10 sec Timed up and go (TUG): 12.01 sec Goal status: IN PROGRESS  PLAN:  PT FREQUENCY: 2x/week  PT DURATION: 8 weeks  PLANNED INTERVENTIONS: Therapeutic exercises, Therapeutic activity, Neuromuscular re-education, Balance training, Gait training, Patient/Family education, Self Care, Joint mobilization, Stair training, DME instructions, Aquatic Therapy, Dry Needling, Electrical stimulation, Spinal mobilization, Cryotherapy, Moist heat, Taping, Traction, Ultrasound, Ionotophoresis 4mg /ml Dexamethasone, Manual therapy, and Re-evaluation.  PLAN FOR NEXT  SESSION: Gait training.  Continue current treatment plan until end of cert period.  Nustep and focus on right hip stability and glut medius strength bilaterally.    Victorino Dike B. Saqib Cazarez, PT 01/14/23 12:30 AM  [facility] [address] [place], [place] [address] Phone # [phone] Fax [phone]

## 2023-01-15 ENCOUNTER — Ambulatory Visit: Payer: Medicare Other

## 2023-01-15 DIAGNOSIS — R262 Difficulty in walking, not elsewhere classified: Secondary | ICD-10-CM | POA: Diagnosis not present

## 2023-01-15 DIAGNOSIS — M6281 Muscle weakness (generalized): Secondary | ICD-10-CM

## 2023-01-15 DIAGNOSIS — R252 Cramp and spasm: Secondary | ICD-10-CM

## 2023-01-15 DIAGNOSIS — M5416 Radiculopathy, lumbar region: Secondary | ICD-10-CM | POA: Diagnosis not present

## 2023-01-15 DIAGNOSIS — M25551 Pain in right hip: Secondary | ICD-10-CM | POA: Diagnosis not present

## 2023-01-15 DIAGNOSIS — R293 Abnormal posture: Secondary | ICD-10-CM | POA: Diagnosis not present

## 2023-01-15 NOTE — Therapy (Signed)
OUTPATIENT PHYSICAL THERAPY THORACOLUMBAR TREATMENT NOTE      Patient Name: Jacqueline Jenkins MRN: [id number] DOB:22-Oct-1950, 72 y.o., female Today's Date: 01/15/2023  END OF SESSION:  PT End of Session - 01/15/23 1443     Visit Number 19    Date for PT Re-Evaluation 01/20/23    Authorization Type MEDICARE PART A AND B    Progress Note Due on Visit 20    PT Start Time 1445    PT Stop Time 1530    PT Time Calculation (min) 45 min    Activity Tolerance Patient tolerated treatment well    Behavior During Therapy WFL for tasks assessed/performed              Past Medical History:  Diagnosis Date   Allergic rhinitis, cause unspecified    Anemia    history of anemia while teenager   BRCA2 positive    Breast cancer (HCC) 2016   Left Breast Cancer   Breast cancer of upper-outer quadrant of left female breast (HCC)    chemo complete 01/2016, radiation 04/2016   DDD (degenerative disc disease)    Diverticulitis    Family history of breast cancer 08/30/2015   Dx. In maternal grandmother in her 90s-40; dx. In maternal first cousin in her 56s-60s    Family history of colon cancer 08/30/2015   Dx. In maternal uncle in his 23s    Hyperlipidemia    Hypertension    Left kidney mass 04/06/2018   Mild growth from Ct 2013 to 04/06/2018; Renal US 2020 no growth- will not follow up   Neuropathy    Osteopenia 12/2011   Spine T -0.4, Femur -2.1   Personal history of chemotherapy 2016   Left Breast Cancer   Personal history of radiation therapy 2016   Left Breast Cancer   PONV (postoperative nausea and vomiting)    Splenic artery aneurysm (HCC) 2013   COILS DONE   Vitamin D deficiency    Past Surgical History:  Procedure Laterality Date   BREAST LUMPECTOMY Left 2016   BREAST LUMPECTOMY WITH RADIOACTIVE SEED AND SENTINEL LYMPH NODE BIOPSY Left 08/29/2015   Procedure: LEFT BREAST LUMPECTOMY WITH RADIOACTIVE SEED WITH AXILLARY SENTINEL LYMPH NODE BIOPSY;  Surgeon: Glenna Fellows, MD;   Location: [facility];  Service: General;  Laterality: Left;   CERVICAL DISC SURGERY  1998   COLONOSCOPY  Jan. 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   Dural AV fistula repair   IR ANGIO EXTERNAL CAROTID SEL EXT CAROTID BILAT MOD SED  02/03/2017   IR ANGIO EXTERNAL CAROTID SEL EXT CAROTID UNI R MOD SED  03/05/2017   IR ANGIO INTRA EXTRACRAN SEL COM CAROTID INNOMINATE UNI R MOD SED  03/05/2017   IR ANGIO INTRA EXTRACRAN SEL INTERNAL CAROTID BILAT MOD SED  02/03/2017   IR ANGIO VERTEBRAL SEL VERTEBRAL BILAT MOD SED  02/03/2017   IR ANGIOGRAM FOLLOW UP STUDY  03/05/2017   IR NEURO EACH ADD'L AFTER BASIC UNI RIGHT (MS)  02/03/2017   IR NEURO EACH ADD'L AFTER BASIC UNI RIGHT (MS)  03/05/2017   IR TRANSCATH/EMBOLIZ  03/05/2017   LAPAROSCOPIC BILATERAL SALPINGO OOPHERECTOMY Bilateral 05/16/2016   Procedure: LAPAROSCOPIC BILATERAL SALPINGO OOPHORECTOMY;  Surgeon: Huel Cote, MD;  Location: [facility];  Service: Gynecology;  Laterality: Bilateral;   LUMBAR DISC SURGERY  2001, 2010   PORTACATH PLACEMENT Bilateral 10/09/2015   Procedure: INSERTION PORT-A-CATH;  Surgeon: Glenna Fellows, MD;  Location: [facility];  Service: General;  Laterality: Bilateral;   Portacath removal     RADIOLOGY WITH ANESTHESIA N/A 03/05/2017   Procedure: ARTERIOGRAM ONYX EMBOLIZATION OF FISTULA;  Surgeon: Lisbeth Renshaw, MD;  Location: [facility];  Service: Radiology;  Laterality: N/A;   RE-EXCISION OF BREAST LUMPECTOMY Left 09/07/2015   Procedure: RE-EXCISION OF LEFT BREAST LUMPECTOMY;  Surgeon: Glenna Fellows, MD;  Location: [facility];  Service: General;  Laterality: Left;   SPINE SURGERY  1998   cervical diskectomy   SPINE SURGERY  2001, 2010   Lumbar diskectomy   splenic aneurysm  02/10/2012   Aneurysm of splenic artery, coil placed, Dr. 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   Genetic testing 09/19/2015   BRCA2 positive 09/19/2015   Breast cancer of upper-outer quadrant of left female breast (HCC) 08/21/2015   Medication management 06/07/2015   Essential hypertension 06/07/2015   Abnormal glucose 11/16/2013   Allergic rhinitis    Hyperlipidemia, mixed    Lumbar degenerative disc disease    Vitamin D deficiency    Aneurysm of splenic artery (HCC) 01/19/2012    PCP: Lucky Cowboy, MD  REFERRING PROVIDER: Barnett Abu, MD  REFERRING DIAG: M54.16 (ICD-10-CM) - Radiculopathy, lumbar region  Rationale for Evaluation and Treatment: Rehabilitation  THERAPY DIAG:  Pain in right hip  Difficulty in walking, not elsewhere classified  Cramp and spasm  Muscle weakness (generalized)  ONSET DATE: 09/30/2022  SUBJECTIVE:                                                                                                                                                                                           SUBJECTIVE STATEMENT: Pt states no soreness or issues.                    PERTINENT HISTORY:  Lumbar laminectomy 09-02-22 (3rd lumbar surgery)  PAIN:  01/15/23: Are you having pain? No and Yes: NPRS scale: I don't really have pain, the right hip and thigh are just numb 0/10  PRECAUTIONS: Back  WEIGHT BEARING RESTRICTIONS: No  FALLS:  Has patient fallen in last 6 months? No  LIVING ENVIRONMENT: Lives with: lives with their spouse Lives in: House/apartment  OCCUPATION:  Retired  PLOF: Independent, Independent with basic ADLs, Independent with household mobility without device, Independent with community mobility without device,  Independent with homemaking with ambulation, Independent with gait, and Independent with transfers  PATIENT GOALS: to eliminate hip pain so that she can properly recover from her back surgery  NEXT MD VISIT:   OBJECTIVE:   DIAGNOSTIC FINDINGS: 07/11/22 IMPRESSION: 1. Lumbar spondylosis and degenerative disc disease, causing prominent impingement at L3-4; moderate impingement at L4-5 and L5-S1; and 3 mm degenerative anterolisthesis at L4-5 and L5-S1. 2. The dominant finding on today's exam is the right lateral recess and foraminal disc protrusion at L3-4 contributing substantially to the prominent right central and subarticular lateral recess stenosis at this level.  PATIENT SURVEYS:  Initial eval: FOTO 41 (predicted 51) 11/25/22:   42  SCREENING FOR RED FLAGS: Bowel or bladder incontinence: No Spinal tumors: No Cauda equina syndrome: No Compression fracture: No Abdominal aneurysm: No  COGNITION: Overall cognitive status: Within functional limits for tasks assessed     SENSATION: Mild L3-L4 radicular symptoms linger in the right thigh  MUSCLE LENGTH: Hamstrings: Right 50 deg; Left 60 deg (generally about 5-10 degrees improvement) Thomas test: Right pos ; Left pos   POSTURE: decreased lumbar lordosis and posterior pelvic tilt  LUMBAR ROM:  Deferred due to 4 weeks post op: complete this at 8 weeks AROM eval 11/25/22  Flexion NT Fingertips to ankle  Extension  50%  Right lateral flexion  Fingertips to joint line  Left lateral flexion  Fingertips to joint line  Right rotation  25%  Left rotation  25%   (Blank rows = not tested)  LOWER EXTREMITY ROM:     WFL  LOWER EXTREMITY MMT:    Generally 4/5 right LE and 4+/5 left LE LUMBAR SPECIAL TESTS:  See diagnostic findings  FUNCTIONAL TESTS:  Initial eval: 5 times sit to stand: 20.10 sec Timed up and go (TUG): 12.01 sec  11/25/22: 5 times sit to stand: 12.74 sec Timed up and go (TUG): 9.30 sec  GAIT: Distance  walked: 30 Assistive device utilized: None Level of assistance: Modified independence Comments: antalgic  TODAY'S TREATMENT:    DATE: 01/15/23 Nustep x 6 min level 5 Step up and hold on balance pad x 10 hold 2-3 sec (fwd then lateral)  Lateral band walks with yellow band x 3 laps  Seated clam with yellow loop x 20 Supine clam x 20 with yellow loop Side lying clam with yellow loop x 20 Bridge with clam x 20 Quadruped fire hydrant with yellow loop 2 x 10  DATE: 01/13/23 Nustep x 6 min level 5 Lateral band walks with yellow band x 3 laps  Seated clam with yellow loop x 20 Supine clam x 20 with yellow loop Side lying clam with yellow loop x 20 Bridge with clam x 20 Quadruped fire hydrant with yellow loop 2 x 10 Cone touches x 10 each LE  DATE: 01/08/23 Nustep x 6 min level 5 Lateral band walks with yellow band x 3 laps  Seated clam with yellow loop x 20 Supine clam x 20 with yellow loop Side lying clam with yellow loop x 20 Quadruped fire hydrant with yellow loop Side lying hip abduction with 3 lbs 2 x 10 each LE Prone hip extension with 3 lbs 2 x 10  Gait training with emphasis on controlling trendelenburg  Cone touches x 10 each LE   PATIENT EDUCATION:  Education details: Initiated HEP and educated on anatomy of the  hip including labrum and possibility of labral involvement in the hip Person educated: Patient Education method: Explanation, Demonstration, Verbal cues, and Handouts Education comprehension: verbalized understanding, returned demonstration, and verbal cues required  HOME EXERCISE PROGRAM: Access Code: [email or web address] URL: [email or web address] Date: 10/29/2022 Prepared by: Mikey Kirschner  Exercises - Supine Hamstring Stretch with Strap  - 1 x daily - 7 x weekly - 1 sets - 3 reps - 30 hold - Supine ITB Stretch with Strap  - 1 x daily - 7 x weekly - 1 sets - 3 reps - 30 hold - Supine Figure 4 Piriformis Stretch  - 1 x daily - 7 x weekly - 1 sets  - 3 reps - 30 hold - Quadricep Stretch with Chair and Counter Support  - 1 x daily - 7 x weekly - 1 sets - 3 reps - 30 sec hold - Supine Butterfly Groin Stretch  - 1 x daily - 7 x weekly - 1 sets - 5 reps - 30 sec hold - Supine Posterior Pelvic Tilt  - 1 x daily - 7 x weekly - 2 sets - 10 reps - Supine Bridge  - 1 x daily - 7 x weekly - 2 sets - 10 reps - Hooklying Clamshell with Resistance  - 1 x daily - 7 x weekly - 2 sets - 10 reps - Clamshell with Resistance  - 1 x daily - 7 x weekly - 2 sets - 10 reps - Sidelying Hip Abduction  - 1 x daily - 7 x weekly - 2 sets - 10 reps - Side Stepping with Resistance at Ankles  - 1 x daily - 7 x weekly - 3 sets - 10 reps  ASSESSMENT:  CLINICAL IMPRESSION: Jacqueline Jenkins is meeting final goals.  She continues to demonstrate a slight Trendelenburg gait when she isn't focused on her gait.   She would benefit from continuing skilled PT for bilateral LE stretching R>L along with post laminectomy protocol.    OBJECTIVE IMPAIRMENTS: decreased mobility, difficulty walking, decreased ROM, decreased strength, increased fascial restrictions, increased muscle spasms, impaired flexibility, impaired sensation, improper body mechanics, and pain.   ACTIVITY LIMITATIONS: carrying, lifting, bending, sitting, standing, squatting, sleeping, stairs, transfers, bed mobility, bathing, dressing, and hygiene/grooming  PARTICIPATION LIMITATIONS: meal prep, cleaning, laundry, interpersonal relationship, driving, shopping, community activity, yard work, and church  PERSONAL FACTORS: Fitness and 1-2 comorbidities: HTN, Hx breast cx  are also affecting patient's functional outcome.   REHAB POTENTIAL: Fair Patient has multiple orthopedic spine issues over a 20 year time period.  She must care for her disabled spouse but does have help now.   CLINICAL DECISION MAKING: Evolving/moderate complexity  EVALUATION COMPLEXITY: Moderate   GOALS: Goals reviewed with patient? Yes  SHORT TERM  GOALS: Target date: 10/29/2022    Pain report to be no greater than 4/10  Baseline: Goal status: MET 12/24/22  2.  Patient will be independent with initial HEP  Baseline:  Goal status: MET   LONG TERM GOALS: Target date: 11/26/2022    Patient to report pain no greater than 2/10  Baseline:  Goal status: MET 12/24/22  2.  Patient to be independent with advanced HEP  Baseline:  Goal status: MET  3.  Radicular symptoms to be centralized Baseline:  Goal status: MET 12/24/22  4.  Patient to be able to sleep through the night  Baseline:  Goal status: MET  5.  Patient to report 85% improvement in overall symptoms  Baseline:  Goal status: IN PROGRESS  6.  FOTO to improve to 61  and functional tests to improve by 2-3 seconds Baseline: FOTO 41 on eval  5 times sit to stand: 20.10 sec Timed up and go (TUG): 12.01 sec Goal status: IN PROGRESS  PLAN:  PT FREQUENCY: 2x/week  PT DURATION: 8 weeks  PLANNED INTERVENTIONS: Therapeutic exercises, Therapeutic activity, Neuromuscular re-education, Balance training, Gait training, Patient/Family education, Self Care, Joint mobilization, Stair training, DME instructions, Aquatic Therapy, Dry Needling, Electrical stimulation, Spinal mobilization, Cryotherapy, Moist heat, Taping, Traction, Ultrasound, Ionotophoresis 4mg /ml Dexamethasone, Manual therapy, and Re-evaluation.  PLAN FOR NEXT SESSION: Gait training emphasizing pelvic symmetry.  Continue current treatment plan until end of cert period.  Nustep and focus on right hip stability and glut medius strength bilaterally.    Victorino Dike B. Salomon Ganser, PT 01/15/23 3:25 PM  [facility] [address], [address] [place], [place] [address] Phone # [phone] Fax [phone]

## 2023-01-19 DIAGNOSIS — D2271 Melanocytic nevi of right lower limb, including hip: Secondary | ICD-10-CM | POA: Diagnosis not present

## 2023-01-19 DIAGNOSIS — L821 Other seborrheic keratosis: Secondary | ICD-10-CM | POA: Diagnosis not present

## 2023-01-19 DIAGNOSIS — D1722 Benign lipomatous neoplasm of skin and subcutaneous tissue of left arm: Secondary | ICD-10-CM | POA: Diagnosis not present

## 2023-01-19 DIAGNOSIS — D1801 Hemangioma of skin and subcutaneous tissue: Secondary | ICD-10-CM | POA: Diagnosis not present

## 2023-01-19 DIAGNOSIS — Q828 Other specified congenital malformations of skin: Secondary | ICD-10-CM | POA: Diagnosis not present

## 2023-01-19 DIAGNOSIS — L814 Other melanin hyperpigmentation: Secondary | ICD-10-CM | POA: Diagnosis not present

## 2023-01-19 DIAGNOSIS — D485 Neoplasm of uncertain behavior of skin: Secondary | ICD-10-CM | POA: Diagnosis not present

## 2023-01-20 ENCOUNTER — Ambulatory Visit: Payer: Medicare Other

## 2023-01-20 DIAGNOSIS — M6281 Muscle weakness (generalized): Secondary | ICD-10-CM | POA: Diagnosis not present

## 2023-01-20 DIAGNOSIS — R293 Abnormal posture: Secondary | ICD-10-CM

## 2023-01-20 DIAGNOSIS — M25551 Pain in right hip: Secondary | ICD-10-CM

## 2023-01-20 DIAGNOSIS — R252 Cramp and spasm: Secondary | ICD-10-CM

## 2023-01-20 DIAGNOSIS — R262 Difficulty in walking, not elsewhere classified: Secondary | ICD-10-CM

## 2023-01-20 DIAGNOSIS — M5416 Radiculopathy, lumbar region: Secondary | ICD-10-CM | POA: Diagnosis not present

## 2023-01-20 NOTE — Therapy (Signed)
OUTPATIENT PHYSICAL THERAPY THORACOLUMBAR TREATMENT NOTE PHYSICAL THERAPY DISCHARGE SUMMARY  Visits from Start of Care: 20  Current functional level related to goals / functional outcomes: See below   Remaining deficits: See below   Education / Equipment: See below   Patient agrees to discharge. Patient goals were met. Patient is being discharged due to meeting the stated rehab goals.       Patient Name: Jacqueline Jenkins MRN: [id number] DOB:10/20/1950, 72 y.o., female Today's Date: 01/20/2023  END OF SESSION:  PT End of Session - 01/20/23 1447     Visit Number 20    Date for PT Re-Evaluation 01/20/23    Authorization Type MEDICARE PART A AND B    Progress Note Due on Visit 20    PT Stop Time 1448    Activity Tolerance Patient tolerated treatment well    Behavior During Therapy [facility] for tasks assessed/performed              Past Medical History:  Diagnosis Date   Allergic rhinitis, cause unspecified    Anemia    history of anemia while teenager   BRCA2 positive    Breast cancer (HCC) 2016   Left Breast Cancer   Breast cancer of upper-outer quadrant of left female breast (HCC)    chemo complete 01/2016, radiation 04/2016   DDD (degenerative disc disease)    Diverticulitis    Family history of breast cancer 08/30/2015   Dx. In maternal grandmother in her 24s-40; dx. In maternal first cousin in her 60s-60s    Family history of colon cancer 08/30/2015   Dx. In maternal uncle in his 38s    Hyperlipidemia    Hypertension    Left kidney mass 04/06/2018   Mild growth from Ct 2013 to 04/06/2018; Renal US 2020 no growth- will not follow up   Neuropathy    Osteopenia 12/2011   Spine T -0.4, Femur -2.1   Personal history of chemotherapy 2016   Left Breast Cancer   Personal history of radiation therapy 2016   Left Breast Cancer   PONV (postoperative nausea and vomiting)    Splenic artery aneurysm (HCC) 2013   COILS DONE   Vitamin D deficiency    Past Surgical History:   Procedure Laterality Date   BREAST LUMPECTOMY Left 2016   BREAST LUMPECTOMY WITH RADIOACTIVE SEED AND SENTINEL LYMPH NODE BIOPSY Left 08/29/2015   Procedure: LEFT BREAST LUMPECTOMY WITH RADIOACTIVE SEED WITH AXILLARY SENTINEL LYMPH NODE BIOPSY;  Surgeon: Glenna Fellows, MD;  Location: [facility];  Service: General;  Laterality: Left;   CERVICAL DISC SURGERY  1998   COLONOSCOPY  Jan. 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   Dural AV fistula repair   IR ANGIO EXTERNAL CAROTID SEL EXT CAROTID BILAT MOD SED  02/03/2017   IR ANGIO EXTERNAL CAROTID SEL EXT CAROTID UNI R MOD SED  03/05/2017   IR ANGIO INTRA EXTRACRAN SEL COM CAROTID INNOMINATE UNI R MOD SED  03/05/2017   IR ANGIO INTRA EXTRACRAN SEL INTERNAL CAROTID BILAT MOD SED  02/03/2017   IR ANGIO VERTEBRAL SEL VERTEBRAL BILAT MOD SED  02/03/2017   IR ANGIOGRAM FOLLOW UP STUDY  03/05/2017   IR NEURO EACH ADD'L AFTER BASIC UNI RIGHT (MS)  02/03/2017   IR NEURO EACH ADD'L AFTER BASIC UNI RIGHT (MS)  03/05/2017   IR TRANSCATH/EMBOLIZ  03/05/2017   LAPAROSCOPIC BILATERAL SALPINGO OOPHERECTOMY Bilateral 05/16/2016   Procedure: LAPAROSCOPIC BILATERAL SALPINGO OOPHORECTOMY;  Surgeon: Huel Cote,  MD;  Location: [facility];  Service: Gynecology;  Laterality: Bilateral;   LUMBAR DISC SURGERY  2001, 2010   PORTACATH PLACEMENT Bilateral 10/09/2015   Procedure: INSERTION PORT-A-CATH;  Surgeon: Glenna Fellows, MD;  Location: [facility];  Service: General;  Laterality: Bilateral;   Portacath removal     RADIOLOGY WITH ANESTHESIA N/A 03/05/2017   Procedure: ARTERIOGRAM ONYX EMBOLIZATION OF FISTULA;  Surgeon: Lisbeth Renshaw, MD;  Location: [facility];  Service: Radiology;  Laterality: N/A;   RE-EXCISION OF BREAST LUMPECTOMY Left 09/07/2015   Procedure: RE-EXCISION OF LEFT BREAST LUMPECTOMY;  Surgeon: Glenna Fellows, MD;  Location: [facility];  Service: General;  Laterality: Left;   SPINE SURGERY  1998   cervical diskectomy   SPINE SURGERY  2001,  2010   Lumbar diskectomy   splenic aneurysm  02/10/2012   Aneurysm of splenic artery, coil placed, Dr. 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   Genetic testing 09/19/2015   BRCA2 positive 09/19/2015   Breast cancer of upper-outer quadrant of left female breast (HCC) 08/21/2015   Medication management 06/07/2015   Essential hypertension 06/07/2015   Abnormal glucose 11/16/2013   Allergic rhinitis    Hyperlipidemia, mixed    Lumbar degenerative disc disease    Vitamin D deficiency    Aneurysm of splenic artery (HCC) 01/19/2012    PCP: Lucky Cowboy, MD  REFERRING PROVIDER: Barnett Abu, MD  REFERRING DIAG: M54.16 (ICD-10-CM) - Radiculopathy, lumbar region  Rationale for Evaluation and Treatment: Rehabilitation  THERAPY DIAG:  Pain in right hip  Difficulty in walking, not elsewhere classified  Cramp and spasm  Muscle weakness (generalized)  Abnormal posture  ONSET DATE: 09/30/2022  SUBJECTIVE:                                                                                                                                                                                           SUBJECTIVE STATEMENT: Pt reports she is doing good.  Ready for graduation.                     PERTINENT HISTORY:  Lumbar laminectomy 09-02-22 (3rd lumbar surgery)  PAIN:  01/15/23: Are you having pain? No  and Yes: NPRS scale: I don't really have pain, still just some right hip pain occasionally 0/10  PRECAUTIONS: Back  WEIGHT BEARING RESTRICTIONS:  No  FALLS:  Has patient fallen in last 6 months? No  LIVING ENVIRONMENT: Lives with: lives with their spouse Lives in: House/apartment  OCCUPATION: Retired  PLOF: Independent, Independent with basic ADLs, Independent with household mobility without device, Independent with community mobility without device, Independent with homemaking with ambulation, Independent with gait, and Independent with transfers  PATIENT GOALS: to eliminate hip pain so that she can properly recover from her back surgery  NEXT MD VISIT:   OBJECTIVE:   DIAGNOSTIC FINDINGS: 07/11/22 IMPRESSION: 1. Lumbar spondylosis and degenerative disc disease, causing prominent impingement at L3-4; moderate impingement at L4-5 and L5-S1; and 3 mm degenerative anterolisthesis at L4-5 and L5-S1. 2. The dominant finding on today's exam is the right lateral recess and foraminal disc protrusion at L3-4 contributing substantially to the prominent right central and subarticular lateral recess stenosis at this level.  PATIENT SURVEYS:  Initial eval: FOTO 41 (predicted 51) 11/25/22:   42 01/20/23:                      47  SCREENING FOR RED FLAGS: Bowel or bladder incontinence: No Spinal tumors: No Cauda equina syndrome: No Compression fracture: No Abdominal aneurysm: No  COGNITION: Overall cognitive status: Within functional limits for tasks assessed     SENSATION: Mild L3-L4 radicular symptoms linger in the right thigh  MUSCLE LENGTH: Hamstrings: Right 50 deg; Left 60 deg (generally about 5-10 degrees improvement) Thomas test: Right pos ; Left pos   POSTURE: decreased lumbar lordosis and posterior pelvic tilt  LUMBAR ROM:  Deferred due to 4 weeks post op: complete this at 8 weeks AROM eval 11/25/22 01/20/23  Flexion NT Fingertips to ankle Fingertips to floor  Extension  50% 75%  Right lateral flexion  Fingertips to joint line Fingertips to joint line  Left lateral flexion  Fingertips to joint line Fingertips to  joint line  Right rotation  25% 75%  Left rotation  25% 75%   (Blank rows = not tested)  LOWER EXTREMITY ROM:     WFL  LOWER EXTREMITY MMT:    Initial eval:  Generally 4/5 right LE and 4+/5 left LE  01/20/23:  Generally 4+/5 right LE and 5-/5 left LE LUMBAR SPECIAL TESTS:  See diagnostic findings  FUNCTIONAL TESTS:  Initial eval: 5 times sit to stand: 20.10 sec Timed up and go (TUG): 12.01 sec  11/25/22: 5 times sit to stand: 12.74 sec Timed up and go (TUG): 9.30 sec  01/20/23: 5 times sit to stand: 10.37 sec Timed up and go (TUG): 8.06 sec  GAIT: Distance walked: 30 Assistive device utilized: None Level of assistance: Modified independence Comments: antalgic  TODAY'S TREATMENT:    DATE: 01/20/23 DC assessment completed Reviewed most important exercises to focus on from here out and how this affects her gait.  Encouraged her to continue working on her gait and avoiding the trendelenburg motion.   Went over DC plan    DATE: 01/15/23 Nustep x 6 min level 5 Step up and hold on balance pad x 10 hold 2-3 sec (fwd then lateral)  Lateral band walks with yellow band x 3 laps  Seated clam with yellow loop x 20 Supine clam x 20 with yellow loop Side lying clam with yellow loop x 20 Bridge with clam x 20 Quadruped fire hydrant with yellow loop  2 x 10  DATE: 01/13/23 Nustep x 6 min level 5 Lateral band walks with yellow band x 3 laps  Seated clam with yellow loop x 20 Supine clam x 20 with yellow loop Side lying clam with yellow loop x 20 Bridge with clam x 20 Quadruped fire hydrant with yellow loop 2 x 10 Cone touches x 10 each LE   PATIENT EDUCATION:  Education details: Initiated HEP and educated on anatomy of the hip including labrum and possibility of labral involvement in the hip Person educated: Patient Education method: Programmer, multimedia, Facilities manager, Verbal cues, and Handouts Education comprehension: verbalized understanding, returned demonstration, and verbal  cues required  HOME EXERCISE PROGRAM: Access Code: [email or web address] URL: [email or web address] Date: 10/29/2022 Prepared by: Mikey Kirschner  Exercises - Supine Hamstring Stretch with Strap  - 1 x daily - 7 x weekly - 1 sets - 3 reps - 30 hold - Supine ITB Stretch with Strap  - 1 x daily - 7 x weekly - 1 sets - 3 reps - 30 hold - Supine Figure 4 Piriformis Stretch  - 1 x daily - 7 x weekly - 1 sets - 3 reps - 30 hold - Quadricep Stretch with Chair and Counter Support  - 1 x daily - 7 x weekly - 1 sets - 3 reps - 30 sec hold - Supine Butterfly Groin Stretch  - 1 x daily - 7 x weekly - 1 sets - 5 reps - 30 sec hold - Supine Posterior Pelvic Tilt  - 1 x daily - 7 x weekly - 2 sets - 10 reps - Supine Bridge  - 1 x daily - 7 x weekly - 2 sets - 10 reps - Hooklying Clamshell with Resistance  - 1 x daily - 7 x weekly - 2 sets - 10 reps - Clamshell with Resistance  - 1 x daily - 7 x weekly - 2 sets - 10 reps - Sidelying Hip Abduction  - 1 x daily - 7 x weekly - 2 sets - 10 reps - Side Stepping with Resistance at Ankles  - 1 x daily - 7 x weekly - 3 sets - 10 reps  ASSESSMENT:  CLINICAL IMPRESSION: Jacqueline Jenkins has met all goals with exception of her predicted FOTO.  However, she is functioning at nearly normal level of function.  She continues to have some occasional right hip pain.  This is most likely OA as the pain pattern is in the groin area.  She understands that if she works hard to correct her trendelenburg gait, this will slow the progression of the OA in the right hip.  She is very compliant and well motivated.  She should continue to do well.  We will DC at this time.    OBJECTIVE IMPAIRMENTS: decreased mobility, difficulty walking, decreased ROM, decreased strength, increased fascial restrictions, increased muscle spasms, impaired flexibility, impaired sensation, improper body mechanics, and pain.   ACTIVITY LIMITATIONS: carrying, lifting, bending, sitting, standing, squatting,  sleeping, stairs, transfers, bed mobility, bathing, dressing, and hygiene/grooming  PARTICIPATION LIMITATIONS: meal prep, cleaning, laundry, interpersonal relationship, driving, shopping, community activity, yard work, and church  PERSONAL FACTORS: Fitness and 1-2 comorbidities: HTN, Hx breast cx  are also affecting patient's functional outcome.   REHAB POTENTIAL: Fair Patient has multiple orthopedic spine issues over a 20 year time period.  She must care for her disabled spouse but does have help now.   CLINICAL DECISION MAKING: Evolving/moderate complexity  EVALUATION COMPLEXITY: Moderate   GOALS: Goals reviewed with patient?  Yes  SHORT TERM GOALS: Target date: 10/29/2022    Pain report to be no greater than 4/10  Baseline: Goal status: MET 12/24/22  2.  Patient will be independent with initial HEP  Baseline:  Goal status: MET   LONG TERM GOALS: Target date: 11/26/2022    Patient to report pain no greater than 2/10  Baseline:  Goal status: MET 12/24/22  2.  Patient to be independent with advanced HEP  Baseline:  Goal status: MET  3.  Radicular symptoms to be centralized Baseline:  Goal status: MET 12/24/22  4.  Patient to be able to sleep through the night  Baseline:  Goal status: MET  5.  Patient to report 85% improvement in overall symptoms  Baseline:  Goal status: MET  6.  FOTO to improve to 61  and functional tests to improve by 2-3 seconds Baseline: FOTO 41 on eval  5 times sit to stand: 20.10 sec Timed up and go (TUG): 12.01 sec Goal status: NOT MET (met functional test goals but FOTO was 47 vs. 61)  PLAN:  PT FREQUENCY: 2x/week  PT DURATION: 8 weeks  PLANNED INTERVENTIONS: Therapeutic exercises, Therapeutic activity, Neuromuscular re-education, Balance training, Gait training, Patient/Family education, Self Care, Joint mobilization, Stair training, DME instructions, Aquatic Therapy, Dry Needling, Electrical stimulation, Spinal mobilization,  Cryotherapy, Moist heat, Taping, Traction, Ultrasound, Ionotophoresis 4mg /ml Dexamethasone, Manual therapy, and Re-evaluation.  PLAN FOR NEXT SESSION: We will DC at this time.      Victorino Dike B. English [place], PT 01/20/23 3:31 PM  [facility] [address] [place], [place] [address] Phone # [phone] Fax [phone]

## 2023-02-13 ENCOUNTER — Ambulatory Visit: Payer: Medicare Other | Admitting: Nurse Practitioner

## 2023-02-19 ENCOUNTER — Ambulatory Visit (INDEPENDENT_AMBULATORY_CARE_PROVIDER_SITE_OTHER): Payer: Medicare Other | Admitting: Nurse Practitioner

## 2023-02-19 ENCOUNTER — Encounter: Payer: Self-pay | Admitting: Nurse Practitioner

## 2023-02-19 VITALS — BP 126/68 | HR 82 | Temp 98.3°F | Ht 63.75 in | Wt 160.8 lb

## 2023-02-19 DIAGNOSIS — R6889 Other general symptoms and signs: Secondary | ICD-10-CM | POA: Diagnosis not present

## 2023-02-19 DIAGNOSIS — E782 Mixed hyperlipidemia: Secondary | ICD-10-CM

## 2023-02-19 DIAGNOSIS — Z79899 Other long term (current) drug therapy: Secondary | ICD-10-CM

## 2023-02-19 DIAGNOSIS — Z0001 Encounter for general adult medical examination with abnormal findings: Secondary | ICD-10-CM

## 2023-02-19 DIAGNOSIS — I700 Atherosclerosis of aorta: Secondary | ICD-10-CM

## 2023-02-19 DIAGNOSIS — C50412 Malignant neoplasm of upper-outer quadrant of left female breast: Secondary | ICD-10-CM | POA: Diagnosis not present

## 2023-02-19 DIAGNOSIS — M818 Other osteoporosis without current pathological fracture: Secondary | ICD-10-CM

## 2023-02-19 DIAGNOSIS — E559 Vitamin D deficiency, unspecified: Secondary | ICD-10-CM | POA: Diagnosis not present

## 2023-02-19 DIAGNOSIS — Z0000 Encounter for general adult medical examination without abnormal findings: Secondary | ICD-10-CM

## 2023-02-19 DIAGNOSIS — I728 Aneurysm of other specified arteries: Secondary | ICD-10-CM

## 2023-02-19 LAB — CBC WITH DIFFERENTIAL/PLATELET
Absolute Monocytes: 618 cells/uL (ref 200–950)
Basophils Absolute: 78 cells/uL (ref 0–200)
Basophils Relative: 0.9 %
Eosinophils Absolute: 278 cells/uL (ref 15–500)
Eosinophils Relative: 3.2 %
HCT: 37.8 % (ref 35.0–45.0)
Hemoglobin: 12.6 g/dL (ref 11.7–15.5)
Lymphs Abs: 1096 cells/uL (ref 850–3900)
MCH: 30.5 pg (ref 27.0–33.0)
MCHC: 33.3 g/dL (ref 32.0–36.0)
MCV: 91.5 fL (ref 80.0–100.0)
MPV: 9.7 fL (ref 7.5–12.5)
Monocytes Relative: 7.1 %
Neutro Abs: 6629 cells/uL (ref 1500–7800)
Neutrophils Relative %: 76.2 %
Platelets: 334 10*3/uL (ref 140–400)
RBC: 4.13 10*6/uL (ref 3.80–5.10)
RDW: 12.4 % (ref 11.0–15.0)
Total Lymphocyte: 12.6 %
WBC: 8.7 10*3/uL (ref 3.8–10.8)

## 2023-02-19 LAB — COMPLETE METABOLIC PANEL WITH GFR
AG Ratio: 1.8 (calc) (ref 1.0–2.5)
ALT: 26 U/L (ref 6–29)
AST: 26 U/L (ref 10–35)
Albumin: 4.5 g/dL (ref 3.6–5.1)
Alkaline phosphatase (APISO): 76 U/L (ref 37–153)
BUN: 23 mg/dL (ref 7–25)
CO2: 23 mmol/L (ref 20–32)
Calcium: 9.8 mg/dL (ref 8.6–10.4)
Chloride: 98 mmol/L (ref 98–110)
Creat: 0.78 mg/dL (ref 0.60–1.00)
Globulin: 2.5 g/dL (calc) (ref 1.9–3.7)
Glucose, Bld: 92 mg/dL (ref 65–99)
Potassium: 4.2 mmol/L (ref 3.5–5.3)
Sodium: 131 mmol/L — ABNORMAL LOW (ref 135–146)
Total Bilirubin: 0.4 mg/dL (ref 0.2–1.2)
Total Protein: 7 g/dL (ref 6.1–8.1)
eGFR: 80 mL/min/{1.73_m2} (ref >=60)

## 2023-02-19 LAB — HEMOGLOBIN A1C
Hgb A1c MFr Bld: 5.9 % of total Hgb — ABNORMAL HIGH (ref <5.7)
Mean Plasma Glucose: 123 mg/dL
eAG (mmol/L): 6.8 mmol/L

## 2023-02-19 LAB — LIPID PANEL
Cholesterol: 177 mg/dL (ref <200)
HDL: 71 mg/dL (ref >=50)
LDL Cholesterol (Calc): 81 mg/dL (calc)
Non-HDL Cholesterol (Calc): 106 mg/dL (calc) (ref <130)
Total CHOL/HDL Ratio: 2.5 (calc) (ref <5.0)
Triglycerides: 148 mg/dL (ref <150)

## 2023-02-19 NOTE — Progress Notes (Signed)
MEDICARE ANNUAL WELLNESS & FOLLOW UP  Assessment:   Encounter for Medicare annual wellness exam Due Annually  Healthcare maintenance reviewed.  Aneurysm of splenic artery (HCC) 5 cm splenic artery aneurysm Completed embolization 01/2022 with coil placed. Was follwing up annually by [place], last 05/2019, was planning for 2 year follow up by CTA.  Will reach out to Dr. Magda [facility] for updated appointment/CTA  Aortic atherosclerosis (HCC) by Abd CT scan in 2021 Control blood pressure, Benicar. Control Cholesterol, Repatha Control BG Remain active.  Other osteoporosis without current pathological fracture No longer on Fosamax d/t SE. Follows with Oncology. Patient will have Oncology to order DEXA.   Pursue a combination of weight-bearing exercises and strength training. Advised on fall prevention measures including proper lighting in all rooms, removal of area rugs and floor clutter, use of walking devices as deemed appropriate, avoidance of uneven walking surfaces. Smoking cessation and moderate alcohol consumption if applicable Consume 800 to 1000 IU of vitamin D daily with a goal vitamin D serum value of 30 ng/mL or higher. Aim for 1000 to 1200 mg of elemental calcium daily through supplements and/or dietary sources.  Hyperlipidemia, mixed Discussed lifestyle modifications. Recommended diet heavy in fruits and veggies, omega 3's. Decrease consumption of animal meats, cheeses, and dairy products. Remain active and exercise as tolerated. Continue to monitor. Check lipids/TSH  Vitamin D deficiency Continue supplement for goal of 60-100 Monitor Vitamin D levels  Malignant neoplasm of upper-outer quadrant of left breast in female, estrogen receptor positive (HCC) Continuing Anastrozole. Mammogram is UTD Continue screening mammogram 1 year. Oncology following  Medication management All medications discussed and reviewed in full. All questions and concerns regarding  medications addressed.     Future Appointments  Date Time Provider Department Center  03/30/2023  2:00 PM Serena Croissant, MD [facility] [place] None  11/12/2023  2:00 PM Adela Glimpse, NP [facility] None  02/19/2024 11:30 AM Adela Glimpse, NP [facility] None    Subjective:   Jacqueline Jenkins is a 72 y.o.  female who presents for acute visit for insect bit and requests AWV be completed while she is here. She has Aneurysm of splenic artery (HCC); Allergic rhinitis; Hyperlipidemia, mixed; Lumbar degenerative disc disease; Vitamin D deficiency; Abnormal glucose; Medication management; Essential hypertension; Breast cancer of upper-outer quadrant of left female breast (HCC); Genetic testing; BRCA2 positive; Dural arteriovenous fistula; Osteoporosis; FHx: heart disease; Aortic atherosclerosis (HCC) by Abd CT scan in 2021; Statin myopathy; and BMI 26.0-26.9,adult on their problem list.  She is primary caregiver for husband with supranuclear palsy/Parkinson's.  Husband is fully disabled.  Has two daughters that provide assistance and supportive care.  She has history of L breast lumpectomy (+ER/+PR/HER2 Neg)  in Dec 2016 followed by chemoradiation, underwent BSO due to + BRCA2 mutation, on anastrazole with plan to continue, and follows with Dr. Pamelia Hoit. DEXA 09/08/2019 showed T score -2.5 by Dr. Pamelia Hoit, has been on fosamax weekly but has stopped taking d/t SE. She had reassuring mammogram 09/18/2021. She is planning to discuss updated DEXA with Oncology.  She had dural AV fistula repaired by neurosurgery in 2019 without issues and was released unless recurrent issue.   BMI is Body mass index is 27.82 kg/m., she has been working on diet and exercise. Generally making good choices. Walking 10 min daily.  Wt Readings from Last 3 Encounters:  02/19/23 160 lb 12.8 oz (72.9 kg)  11/12/22 160 lb 12.8 oz (72.9 kg)  07/08/22 156 lb (70.8 kg)   She follows with  Dr. Myra Gianotti for large 5 cm splenic artery aneurysm,     She underwent: Embolization in June 2013, had coil placed as well.  Was follwing up annually by [place], last 05/2019, was planning for 2 year follow up by CTA. Unclear as this has been completed.   Her blood pressure has been controlled at home (120s-low 140s rarely, 70-80s), today their BP is BP: 126/68,  She does workout, but active caring for husband She denies chest pain, shortness of breath, dizziness. No leg swelling.  She is on cholesterol medication, hx of numerous intolerances, now on repatha via Dr. Rennis Golden, does self injections and doing well. Had cardiac calcium score with score of 8 at 50% with her age, and she had aortic atherosclerosis. Her cholesterol is at goal of LDL <100. The cholesterol last visit was:  Lab Results  Component Value Date   CHOL 182 11/12/2022   HDL 74 11/12/2022   LDLCALC 88 11/12/2022   TRIG 102 11/12/2022   CHOLHDL 2.5 11/12/2022   She has had prediabetes, controlled by lifestyle  She denies foot ulcerations, hyperglycemia, hypoglycemia , increased appetite, nausea, paresthesia of the feet, polydipsia, polyuria, visual disturbances, vomiting and weight loss.  Last A1C in the office was:  Lab Results  Component Value Date   HGBA1C 5.7 (H) 11/12/2022    Last GFR:  Lab Results  Component Value Date   GFRNONAA 83 11/05/2020   Patient is on Vitamin D supplement Lab Results  Component Value Date   VD25OH 55 11/12/2022        Medication Review:   Current Outpatient Medications (Cardiovascular):    Evolocumab (REPATHA SURECLICK) 140 MG/ML SOAJ, INJECT 1 DOSE INTO SKIN EVERY 14 DAYS   olmesartan-hydrochlorothiazide (BENICAR HCT) 40-12.5 MG tablet, TAKE 1 TAB DAILY IN THE MORNING FOR BLOOD PRESSURE.   Current Outpatient Medications (Analgesics):    naproxen sodium (ANAPROX) 220 MG tablet, Take 220 mg by mouth daily as needed (pain).   Current Outpatient Medications (Other):    anastrozole (ARIMIDEX) 1 MG tablet, Take 1 tablet (1 mg total) by mouth  daily.   Calcium Carb-Cholecalciferol (CALCIUM 1000 + D PO), Take 2 tablets by mouth daily. Gummies   Multiple Vitamin (MULTIVITAMIN) tablet, Take 1 tablet by mouth daily.   VITAMIN D PO, Take 5,000 Units by mouth. Takes 1 capsule every other day.   gabapentin (NEURONTIN) 100 MG capsule, Take  1 to 3 capsules   3 x / day  for Sciatica Pain  Current Problems (verified) Patient Active Problem List   Diagnosis Date Noted   BMI 26.0-26.9,adult 11/01/2021   Statin myopathy 08/01/2020   Aortic atherosclerosis (HCC) by Abd CT scan in 2021 07/31/2020   FHx: heart disease 12/28/2017   Osteoporosis 06/17/2017   Dural arteriovenous fistula 03/05/2017   Genetic testing 09/19/2015   BRCA2 positive 09/19/2015   Breast cancer of upper-outer quadrant of left female breast (HCC) 08/21/2015   Medication management 06/07/2015   Essential hypertension 06/07/2015   Abnormal glucose 11/16/2013   Allergic rhinitis    Hyperlipidemia, mixed    Lumbar degenerative disc disease    Vitamin D deficiency    Aneurysm of splenic artery (HCC) 01/19/2012    Allergies Allergies  Allergen Reactions   Lescol [Fluvastatin Sodium] Other (See Comments)    Myalgia   Lipitor [Atorvastatin] Other (See Comments)    Increased LFT's   Vytorin [Ezetimibe-Simvastatin] Other (See Comments)    myalgia     Immunization History  Administered Date(s) Administered  DT (Pediatric) 01/22/2015   Influenza Split 06/08/2014, 06/02/2016   Influenza, High Dose Seasonal PF 06/08/2014, 05/27/2017, 06/01/2018, 06/02/2019, 06/01/2020   Influenza-Unspecified 06/14/2013, 06/04/2015, 05/27/2017   Meningococcal Conjugate 09/02/2011   Moderna Sars-Covid-2 Vaccination 10/17/2019, 11/15/2019   PFIZER(Purple Top)SARS-COV-2 Vaccination 06/27/2020, 01/22/2021   PPD Test 01/18/2014   Pneumococcal Conjugate-13 12/31/2011, 06/17/2017   Pneumococcal Polysaccharide-23 09/02/2011   Pneumococcal-Unspecified 09/22/1995, 09/02/2011   Td  11/27/1994, 04/29/2004, 01/22/2015   Tetanus: 2016 Influenza: 06/2022 Pneumonia: 2013 Prevnar: 2018 Shingrix: discussed, cost, check with insurance  Covid 19: 2/2, moderna/pfizer and booster x 2  Preventative care: Last colonoscopy: Completed Cologuard 2023 Due 2026 DEXA 09/2019- Osteoporosis -2.5 Dr. Pamelia Hoit is managing  MGM 09/2022   PAP 03/2019 at GYN,  Follows Dr. Senaida Ores [facility]   Names of Other Physician/Practitioners you currently use: 1. [facility] here for primary care 2. Dr. Cathey Endow, eye doctor, last visit 2024. q 6 months, wears glasses  3. Dr. Forde Dandy, dentist, last visit 08/2023 4. Dr. Monia Pouch did dental implant 5. Dr. Amy [place], derm, last 10/2022   SURGICAL HISTORY She  has a past surgical history that includes Tonsillectomy (1968); Spine surgery (1998); Spine surgery (2001, 2010); Cervical disc surgery (1998); Lumbar disc surgery (2001, 2010); splenic aneurysm (02/10/2012); Colonoscopy (Jan. 7, 2014); visceral angiogram (N/A, 02/10/2012); Breast lumpectomy with radioactive seed and sentinel lymph node biopsy (Left, 08/29/2015); Re-excision of breast lumpectomy (Left, 09/07/2015); Portacath placement (Bilateral, 10/09/2015); Portacath removal; Laparoscopic bilateral salpingo oophorectomy (Bilateral, 05/16/2016); IR ANGIO INTRA EXTRACRAN SEL INTERNAL CAROTID BILAT MOD SED (02/03/2017); IR NEURO EACH ADD'L AFTER BASIC UNI RIGHT (MS) (02/03/2017); IR ANGIO VERTEBRAL SEL VERTEBRAL BILAT MOD SED (02/03/2017); IR ANGIO EXTERNAL CAROTID SEL EXT CAROTID BILAT MOD SED (02/03/2017); Radiology with anesthesia (N/A, 03/05/2017); IR NEURO EACH ADD'L AFTER BASIC UNI RIGHT (MS) (03/05/2017); IR Angiogram Follow Up Study (03/05/2017); IR Transcath/Emboliz (03/05/2017); IR ANGIO INTRA EXTRACRAN SEL COM CAROTID INNOMINATE UNI R MOD SED (03/05/2017); IR ANGIO EXTERNAL CAROTID SEL EXT CAROTID UNI R MOD SED (03/05/2017); Breast lumpectomy (Left, 2016); and dural AV fistula (03/2018).    FAMILY HISTORY Her family history includes Breast cancer in her cousin and maternal grandmother; Colon cancer in her maternal uncle; Deep vein thrombosis in her mother; Diverticulitis in her mother; Heart Problems in her paternal grandfather; Heart attack in her father, paternal aunt, paternal grandmother, and paternal uncle; Heart defect in her maternal aunt; Hyperlipidemia in her mother; Hypertension in her father and mother; Leukemia in her maternal aunt; Lung cancer in her maternal uncle; Osteoporosis in her mother; Other in her father, mother, and sister; Pancreatic cancer in her maternal uncle; Stroke in her father, maternal grandfather, mother, paternal aunt, paternal grandmother, and paternal uncle; Vasculitis in her sister.   SOCIAL HISTORY She  reports that she has never smoked. She has never used smokeless tobacco. She reports that she does not currently use alcohol. She reports that she does not use drugs.   Review of Systems  Constitutional:  Negative for chills, fever, malaise/fatigue and weight loss.  HENT:  Negative for congestion, ear pain, nosebleeds and sore throat.   Eyes: Negative.   Respiratory:  Negative for cough, shortness of breath and wheezing.   Cardiovascular:  Negative for chest pain, palpitations and leg swelling.  Gastrointestinal:  Negative for blood in stool, constipation, diarrhea, heartburn, melena, nausea and vomiting.  Genitourinary:  Negative for dysuria, frequency and urgency.  Musculoskeletal:  Negative for back pain, falls, joint pain, myalgias and neck pain.  Skin:  Negative for rash.  Neurological:  Negative for dizziness, sensory change, loss of consciousness and headaches.  Psychiatric/Behavioral:  Negative for depression. The patient is not nervous/anxious and does not have insomnia.      Objective:     BP 126/68   Pulse 82   Temp 98.3 F (36.8 C)   Ht 5' 3.75" (1.619 m)   Wt 160 lb 12.8 oz (72.9 kg)   SpO2 99%   BMI 27.82 kg/m    General Appearance: Well nourished, alert, WD/WN, female and in no apparent distress. Eyes: PERRLA, EOMs, conjunctiva no swelling or erythema Sinuses: No frontal/maxillary tenderness ENT/Mouth: EACs patent / TMs  nl. Nares clear without erythema, swelling, mucoid exudates. Oral hygiene is good. No erythema, swelling, or exudate. Tongue normal, non-obstructing. Tonsils not swollen or erythematous. Hearing normal.  Neck: Supple, thyroid normal. No bruits, nodes or JVD. Respiratory: Respiratory effort normal.  BS equal and clear bilateral without rales, rhonci, wheezing or stridor. Cardio: Heart sounds are normal with regular rate and rhythm and no murmurs, rubs or gallops. Peripheral pulses are normal and equal bilaterally without edema. No aortic or femoral bruits. Abdomen: Flat, soft  with nl bowel sounds. Nontender, no guarding, rebound, hernias, masses, or organomegaly.  Lymphatics: Non tender without lymphadenopathy.  Musculoskeletal: Full ROM all peripheral extremities, no deformity.  Skin: Warm and dry; without concerning lesions, cyanosis, clubbing or  ecchymosis. Multiple seborrheic keratosis across the chest and the abdomen. She has mild erythematous/petichial rash to L inner ankle, approx 4 cm area, improving per comparison to photo from yesterday. No heat, tenderness, distinct lesions.  Neuro: Cranial nerves intact, reflexes equal bilaterally. Normal muscle tone, no cerebellar symptoms. Sensation intact.  Pysch: Alert and oriented X 3, normal affect, Insight and Judgment appropriate.    The patient's weight, height, BMI  have been recorded in the chart.  I have made referrals, counseling, and provided education to the patient based on review of the above and I have provided the patient with a written personalized care plan for preventive services.      Adela Glimpse, NP   02/19/2023

## 2023-02-19 NOTE — Patient Instructions (Signed)

## 2023-03-20 ENCOUNTER — Ambulatory Visit
Admission: RE | Admit: 2023-03-20 | Discharge: 2023-03-20 | Disposition: A | Discharge status: Home / Self Care | Payer: Medicare Other | Source: Ambulatory Visit | Attending: Hematology and Oncology | Admitting: Hematology and Oncology

## 2023-03-20 DIAGNOSIS — Z853 Personal history of malignant neoplasm of breast: Secondary | ICD-10-CM | POA: Diagnosis not present

## 2023-03-20 DIAGNOSIS — Z170 Estrogen receptor positive status [ER+]: Secondary | ICD-10-CM

## 2023-03-20 DIAGNOSIS — Z780 Asymptomatic menopausal state: Secondary | ICD-10-CM

## 2023-03-20 DIAGNOSIS — Z1239 Encounter for other screening for malignant neoplasm of breast: Secondary | ICD-10-CM | POA: Diagnosis not present

## 2023-03-20 MED ORDER — GADOPICLENOL 0.5 MMOL/ML IV SOLN
7.0000 mL | Freq: Once | INTRAVENOUS | Status: AC | PRN
  Administered 2023-03-20: 7 mL via INTRAVENOUS

## 2023-03-29 NOTE — Progress Notes (Signed)
Patient Care Team: Lucky Cowboy, MD as PCP - General (Internal Medicine) Nada Libman, MD as Consulting Physician (Vascular Surgery) Jeani Hawking, MD as Consulting Physician (Gastroenterology) [place], Amy, MD as Consulting Physician (Dermatology) Serena Croissant, MD as Consulting Physician (Hematology and Oncology) Mckinley Jewel, MD as Consulting Physician (Ophthalmology) Huel Cote, MD as Consulting Physician (Obstetrics and Gynecology)  DIAGNOSIS: No diagnosis found.  SUMMARY OF ONCOLOGIC HISTORY: Oncology History  Breast cancer of upper-outer quadrant of left female breast (HCC)  08/03/2015 Mammogram   Left Breast: 6 mm mass @ 1 o clock   08/09/2015 Initial Diagnosis   Left breast 1:00 position: Invasive ductal carcinoma grade 1-2, ER 100%, PR 10%, HER-2 negative ratio 1.79, Ki-67 15%, T1 BN 0 stage I a clinical stage   08/29/2015 Surgery   Left lumpectomy ([facility]): invasive ductal carcinoma 1.2 cm with LVID, with DCIS intermediate grade, 1 sentinel node positive, ER 100%, PR 10%, HER-2 negative ratio 1.79, Ki-67 15% , margins negative, Mammaprint high risk, luminal B, T1cN1 stage II a   08/29/2015 Procedure   Mammaprint: High-risk, Luminal type   09/07/2015 Surgery   Left breast re-excision ([facility]): ADH, no malignancy, negative margins.    09/12/2015 Procedure   BRCA2 mutation "[id number]." Also VUS on CHEK2.  Otherwise negative. Genes analyzed:  ATM, BARD1, BRCA1, BRCA2, BRIP1, CDH1, CHEK2, FANCC, MLH1, MSH2, MSH6, NBN, PALB2, PMS2, PTEN, RAD51C, RAD51D, TP53, and XRCC2; deletion/duplication analysis (without sequencing) for EPCAM.   10/11/2015 - 02/21/2016 Chemotherapy   Adjuvant chemotherapy with dose dense Adriamycin and Cytoxan 4 followed by Abraxane weekly 12   03/27/2016 - 04/23/2016 Radiation Therapy   Adjuvant radiation therapy Mitzi Hansen). Left breast: 42.5 Gy in 17 fractions. Left breast boost: 7.5 Gy in 3 fractions.    05/16/2016 Surgery    Bilateral salpingo-oophorectomies: No cancer   06/05/2016 -  Anti-estrogen oral therapy   Anastrozole 1 mg by mouth daily     CHIEF COMPLIANT:   INTERVAL HISTORY: Jacqueline Jenkins is a   ALLERGIES:  is allergic to lescol [fluvastatin sodium], lipitor [atorvastatin], and vytorin [ezetimibe-simvastatin].  MEDICATIONS:  Current Outpatient Medications  Medication Sig Dispense Refill   anastrozole (ARIMIDEX) 1 MG tablet Take 1 tablet (1 mg total) by mouth daily. 90 tablet 3   Calcium Carb-Cholecalciferol (CALCIUM 1000 + D PO) Take 2 tablets by mouth daily. Gummies     Evolocumab (REPATHA SURECLICK) 140 MG/ML SOAJ INJECT 1 DOSE INTO SKIN EVERY 14 DAYS 6 mL 3   gabapentin (NEURONTIN) 100 MG capsule Take  1 to 3 capsules   3 x / day  for Sciatica Pain 270 capsule 0   Magnesium 250 MG TABS Take by mouth daily.     Multiple Vitamin (MULTIVITAMIN) tablet Take 1 tablet by mouth daily.     naproxen sodium (ANAPROX) 220 MG tablet Take 220 mg by mouth daily as needed (pain).     olmesartan-hydrochlorothiazide (BENICAR HCT) 40-12.5 MG tablet TAKE 1 TAB DAILY IN THE MORNING FOR BLOOD PRESSURE. 30 tablet 5   VITAMIN D PO Take 5,000 Units by mouth. Takes 1 capsule every other day.     No current facility-administered medications for this visit.    PHYSICAL EXAMINATION: ECOG PERFORMANCE STATUS: {CHL ONC ECOG [phone]}  There were no vitals filed for this visit. There were no vitals filed for this visit.  BREAST:*** No palpable masses or nodules in either right or left breasts. No palpable axillary supraclavicular or infraclavicular adenopathy no breast tenderness or nipple discharge. (exam performed  in the presence of a chaperone)  LABORATORY DATA:  I have reviewed the data as listed    Latest Ref Rng & Units 02/19/2023    2:27 PM 11/12/2022    2:53 PM 05/27/2022    3:26 PM  CMP  Glucose 65 - 99 mg/dL 92  84  83   BUN 7 - 25 mg/dL 23  21  15    Creatinine 0.60 - 1.00 mg/dL 5.40  9.81  1.91    Sodium 135 - 146 mmol/L 131  134  131   Potassium 3.5 - 5.3 mmol/L 4.2  4.5  4.3   Chloride 98 - 110 mmol/L 98  98  97   CO2 20 - 32 mmol/L 23  25  26    Calcium 8.6 - 10.4 mg/dL 9.8  9.9  9.6   Total Protein 6.1 - 8.1 g/dL 7.0  7.3  7.3   Total Bilirubin 0.2 - 1.2 mg/dL 0.4  0.4  0.4   AST 10 - 35 U/L 26  25  28    ALT 6 - 29 U/L 26  22  22      Lab Results  Component Value Date   WBC 8.7 02/19/2023   HGB 12.6 02/19/2023   HCT 37.8 02/19/2023   MCV 91.5 02/19/2023   PLT 334 02/19/2023   NEUTROABS 6,629 02/19/2023    ASSESSMENT & PLAN:  No problem-specific Assessment & Plan notes found for this encounter.    No orders of the defined types were placed in this encounter.  The patient has a good understanding of the overall plan. she agrees with it. she will call with any problems that may develop before the next visit here. Total time spent: 30 mins including face to face time and time spent for planning, charting and co-ordination of care   Sherlyn Lick, CMA 03/29/23    I Janan Ridge am acting as a Neurosurgeon for [employer]  ***

## 2023-03-30 ENCOUNTER — Inpatient Hospital Stay: Payer: Medicare Other | Attending: Hematology and Oncology | Admitting: Hematology and Oncology

## 2023-03-30 ENCOUNTER — Other Ambulatory Visit: Payer: Self-pay

## 2023-03-30 VITALS — BP 144/76 | HR 84 | Temp 98.1°F | Resp 18 | Ht 63.75 in | Wt 166.5 lb

## 2023-03-30 DIAGNOSIS — Z170 Estrogen receptor positive status [ER+]: Secondary | ICD-10-CM | POA: Insufficient documentation

## 2023-03-30 DIAGNOSIS — C50412 Malignant neoplasm of upper-outer quadrant of left female breast: Secondary | ICD-10-CM | POA: Diagnosis not present

## 2023-03-30 DIAGNOSIS — M810 Age-related osteoporosis without current pathological fracture: Secondary | ICD-10-CM | POA: Insufficient documentation

## 2023-03-30 DIAGNOSIS — Z923 Personal history of irradiation: Secondary | ICD-10-CM | POA: Diagnosis not present

## 2023-03-30 DIAGNOSIS — Z79811 Long term (current) use of aromatase inhibitors: Secondary | ICD-10-CM | POA: Insufficient documentation

## 2023-03-30 DIAGNOSIS — Z1501 Genetic susceptibility to malignant neoplasm of breast: Secondary | ICD-10-CM | POA: Insufficient documentation

## 2023-03-30 MED ORDER — ANASTROZOLE 1 MG PO TABS: 1.0000 mg | ORAL_TABLET | Freq: Every day | ORAL | 3 refills | Status: DC

## 2023-03-30 NOTE — Assessment & Plan Note (Addendum)
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 Patient does not need axillary lymph node dissection based on current NCCN guidelines and ACOSOG Z 11 clinical trial; luminal type B and high-risk phenotype. Risk of relapse without chemotherapy 24% versus 12% with chemotherapy.   Treatment summary: 1. Dose dense Adriamycin and Cytoxan 4 followed by Abraxane weekly 12  started 10/11/2015 completed 02/21/2016 2. Adjuvant radiation therapy started 03/27/2016 completed 04/23/2016 3. Adjuvant antiestrogen therapy started 05/22/2016 ----------------------------------------------------------------------------------------------------------------- Current treatment: Antiestrogen therapy with anastrozole 1 mg daily to start 06/01/2016 BRCA2 mutation: Oophorectomy 05/16/2016: No malignancy   Anastrozole Toxicities:  Able to tolerate anastrozole fairly well. Denies any hot flashes or myalgias.  She would like to stay on antiestrogen therapy indefinitely.   Surveillance of breast cancer: 1. Mammograms 09/24/2022: Benign, breast density category B 2. MRI breast 03/20/2023. (Because of BRCA2 mutation): No evidence of recurrent breast cancer, 3.  Breast exam 03/30/2023: Benign   Osteoporosis: Bone density J 09/23/2020: T score -3.2 Discussion therapy: I strongly recommended bisphosphonate.  I recommended starting her on Prolia next week. Her husband is completely disabled and requires her assistance.    Return to clinic 1 year for follow-up with another breast MRI

## 2023-03-31 ENCOUNTER — Other Ambulatory Visit: Payer: Self-pay | Admitting: Hematology and Oncology

## 2023-03-31 DIAGNOSIS — Z0000 Encounter for general adult medical examination without abnormal findings: Secondary | ICD-10-CM

## 2023-04-01 DIAGNOSIS — Z6827 Body mass index (BMI) 27.0-27.9, adult: Secondary | ICD-10-CM | POA: Diagnosis not present

## 2023-04-01 DIAGNOSIS — M5416 Radiculopathy, lumbar region: Secondary | ICD-10-CM | POA: Diagnosis not present

## 2023-04-17 ENCOUNTER — Other Ambulatory Visit: Payer: Self-pay

## 2023-04-17 DIAGNOSIS — C50412 Malignant neoplasm of upper-outer quadrant of left female breast: Secondary | ICD-10-CM

## 2023-04-20 ENCOUNTER — Inpatient Hospital Stay: Payer: Medicare Other

## 2023-04-20 ENCOUNTER — Inpatient Hospital Stay: Payer: Medicare Other | Attending: Hematology and Oncology

## 2023-04-20 VITALS — BP 164/73 | HR 81 | Temp 98.2°F | Resp 18

## 2023-04-20 DIAGNOSIS — Z90722 Acquired absence of ovaries, bilateral: Secondary | ICD-10-CM | POA: Diagnosis not present

## 2023-04-20 DIAGNOSIS — M810 Age-related osteoporosis without current pathological fracture: Secondary | ICD-10-CM | POA: Diagnosis not present

## 2023-04-20 DIAGNOSIS — Z79811 Long term (current) use of aromatase inhibitors: Secondary | ICD-10-CM | POA: Diagnosis not present

## 2023-04-20 DIAGNOSIS — C50412 Malignant neoplasm of upper-outer quadrant of left female breast: Secondary | ICD-10-CM | POA: Diagnosis not present

## 2023-04-20 DIAGNOSIS — M818 Other osteoporosis without current pathological fracture: Secondary | ICD-10-CM

## 2023-04-20 DIAGNOSIS — Z170 Estrogen receptor positive status [ER+]: Secondary | ICD-10-CM | POA: Insufficient documentation

## 2023-04-20 LAB — CMP (CANCER CENTER ONLY)
ALT: 32 U/L (ref 0–44)
AST: 29 U/L (ref 15–41)
Albumin: 4.3 g/dL (ref 3.5–5.0)
Alkaline Phosphatase: 70 U/L (ref 38–126)
Anion gap: 8 (ref 5–15)
BUN: 25 mg/dL — ABNORMAL HIGH (ref 8–23)
CO2: 25 mmol/L (ref 22–32)
Calcium: 9.6 mg/dL (ref 8.9–10.3)
Chloride: 99 mmol/L (ref 98–111)
Creatinine: 0.68 mg/dL (ref 0.44–1.00)
GFR, Estimated: >60 mL/min (ref >60)
Glucose, Bld: 99 mg/dL (ref 70–99)
Potassium: 3.9 mmol/L (ref 3.5–5.1)
Sodium: 132 mmol/L — ABNORMAL LOW (ref 135–145)
Total Bilirubin: 0.4 mg/dL (ref 0.3–1.2)
Total Protein: 7.3 g/dL (ref 6.5–8.1)

## 2023-04-20 LAB — CBC WITH DIFFERENTIAL (CANCER CENTER ONLY)
Abs Immature Granulocytes: 0.02 10*3/uL (ref 0.00–0.07)
Basophils Absolute: 0.1 10*3/uL (ref 0.0–0.1)
Basophils Relative: 1 %
Eosinophils Absolute: 0.2 10*3/uL (ref 0.0–0.5)
Eosinophils Relative: 3 %
HCT: 37.3 % (ref 36.0–46.0)
Hemoglobin: 12.8 g/dL (ref 12.0–15.0)
Immature Granulocytes: 0 %
Lymphocytes Relative: 15 %
Lymphs Abs: 1.1 10*3/uL (ref 0.7–4.0)
MCH: 31 pg (ref 26.0–34.0)
MCHC: 34.3 g/dL (ref 30.0–36.0)
MCV: 90.3 fL (ref 80.0–100.0)
Monocytes Absolute: 0.6 10*3/uL (ref 0.1–1.0)
Monocytes Relative: 8 %
Neutro Abs: 5.4 10*3/uL (ref 1.7–7.7)
Neutrophils Relative %: 73 %
Platelet Count: 307 10*3/uL (ref 150–400)
RBC: 4.13 MIL/uL (ref 3.87–5.11)
RDW: 13 % (ref 11.5–15.5)
WBC Count: 7.5 10*3/uL (ref 4.0–10.5)
nRBC: 0 % (ref 0.0–0.2)

## 2023-04-20 MED ORDER — DENOSUMAB 60 MG/ML ~~LOC~~ SOSY
60.0000 mg | PREFILLED_SYRINGE | Freq: Once | SUBCUTANEOUS | Status: AC
  Administered 2023-04-20: 60 mg via SUBCUTANEOUS
  Filled 2023-04-20: qty 1

## 2023-04-20 NOTE — Patient Instructions (Signed)
Denosumab Injection (Osteoporosis) What is this medication? DENOSUMAB (den oh SUE mab) prevents and treats osteoporosis. It works by making your bones stronger and less likely to break (fracture). It is a monoclonal antibody. This medicine may be used for other purposes; ask your health care provider or pharmacist if you have questions. COMMON BRAND NAME(S): Prolia What should I tell my care team before I take this medication? They need to know if you have any of these conditions: Dental or gum disease Had thyroid or parathyroid (glands located in neck) surgery Having dental surgery or a tooth pulled Kidney disease Low levels of calcium in the blood On dialysis Poor nutrition Thyroid disease Trouble absorbing nutrients from your food An unusual or allergic reaction to denosumab, other medications, foods, dyes, or preservatives Pregnant or trying to get pregnant Breastfeeding How should I use this medication? This medication is injected under the skin. It is given by your care team in a hospital or clinic setting. A special MedGuide will be given to you before each treatment. Be sure to read this information carefully each time. Talk to your care team about the use of this medication in children. Special care may be needed. Overdosage: If you think you have taken too much of this medicine contact a poison control center or emergency room at once. NOTE: This medicine is only for you. Do not share this medicine with others. What if I miss a dose? Keep appointments for follow-up doses. It is important not to miss your dose. Call your care team if you are unable to keep an appointment. What may interact with this medication? Do not take this medication with any of the following: Other medications that contain denosumab This medication may also interact with the following: Medications that lower your chance of fighting infection Steroid medications, such as prednisone or cortisone This  list may not describe all possible interactions. Give your health care provider a list of all the medicines, herbs, non-prescription drugs, or dietary supplements you use. Also tell them if you smoke, drink alcohol, or use illegal drugs. Some items may interact with your medicine. What should I watch for while using this medication? Your condition will be monitored carefully while you are receiving this medication. You may need blood work done while taking this medication. This medication may increase your risk of getting an infection. Call your care team for advice if you get a fever, chills, sore throat, or other symptoms of a cold or flu. Do not treat yourself. Try to avoid being around people who are sick. Tell your dentist and dental surgeon that you are taking this medication. You should not have major dental surgery while on this medication. See your dentist to have a dental exam and fix any dental problems before starting this medication. Take good care of your teeth while on this medication. Make sure you see your dentist for regular follow-up appointments. This medication may cause low levels of calcium in your body. The risk of severe side effects is increased in people with kidney disease. Your care team may prescribe calcium and vitamin D to help prevent low calcium levels while you take this medication. It is important to take calcium and vitamin D as directed by your care team. Talk to your care team if you may be pregnant. Serious birth defects may occur if you take this medication during pregnancy and for 5 months after the last dose. You will need a negative pregnancy test before starting this medication. Contraception   is recommended while taking this medication and for 5 months after the last dose. Your care team can help you find the option that works for you. Talk to your care team before breastfeeding. Changes to your treatment plan may be needed. What side effects may I notice from  receiving this medication? Side effects that you should report to your care team as soon as possible: Allergic reactions--skin rash, itching, hives, swelling of the face, lips, tongue, or throat Infection--fever, chills, cough, sore throat, wounds that don't heal, pain or trouble when passing urine, general feeling of discomfort or being unwell Low calcium level--muscle pain or cramps, confusion, tingling, or numbness in the hands or feet Osteonecrosis of the jaw--pain, swelling, or redness in the mouth, numbness of the jaw, poor healing after dental work, unusual discharge from the mouth, visible bones in the mouth Severe bone, joint, or muscle pain Skin infection--skin redness, swelling, warmth, or pain Side effects that usually do not require medical attention (report these to your care team if they continue or are bothersome): Back pain Headache Joint pain Muscle pain Pain in the hands, arms, legs, or feet Runny or stuffy nose Sore throat This list may not describe all possible side effects. Call your doctor for medical advice about side effects. You may report side effects to FDA at 1-800-FDA-1088. Where should I keep my medication? This medication is given in a hospital or clinic. It will not be stored at home. NOTE: This sheet is a summary. It may not cover all possible information. If you have questions about this medicine, talk to your doctor, pharmacist, or health care provider.  2024 Elsevier/Gold Standard (2022-09-23 00:00:00)  

## 2023-05-24 ENCOUNTER — Encounter: Payer: Self-pay | Admitting: Internal Medicine

## 2023-05-24 NOTE — Progress Notes (Unsigned)
Future Appointments  Date Time Provider Department  05/25/2023                            3 mo  2:30 PM Lucky Cowboy, MD [facility]  11/12/2023                            cpe   2:00 PM Adela Glimpse, NP [facility]  02/19/2024                           wellness  11:30 AM Adela Glimpse, NP [facility]  03/31/2024  2:30 PM Serena Croissant, MD [facility]    History of Present Illness:       This very nice 72 y.o. MWF presents for 3 month follow up with HTN, HLD, Pre-Diabetes and Vitamin D Deficiency. Patient is followed by Dr Pamelia Hoit on Arimidex for hx/o Lt Breast Lumpectomy (2016) tx'd Chemo-radiation. Abd CT scan in 2021 revealed Aortic atherosclerosis.       Patient is treated for HTN (1996) & BP has been controlled and today's BP is   138/76 . Patient has had no complaints of any cardiac type chest pain, palpitations, dyspnea Pollyann Kennedy /PND, dizziness, claudication or dependent edema.       Patient has hx/o  Statin myopathy and is on PCSK9i /Repatha q2weeks. Hyperlipidemia is controlled with diet & meds. Patient denies myalgias or other med SE's.  Last Lipids were at goal :  Lab Results  Component Value Date   CHOL 188 11/05/2021   HDL 71 11/05/2021   LDLCALC 98 11/05/2021   TRIG 101 11/05/2021   CHOLHDL 2.6 11/05/2021     Also, the patient has history of PreDiabetes (A1c 6.2% /2016 and 5.7% /2016) and has had no symptoms of reactive hypoglycemia, diabetic polys, paresthesias or visual blurring.   Last A1c was normal & at goal :  Lab Results  Component Value Date   HGBA1C 5.6 11/05/2021                                                           Further, the patient also has history of Vitamin D Deficiency ("29" /2016) and supplements vitamin D without any suspected side-effects. Last vitamin D was low :  Lab Results  Component Value Date   VD25OH 35 11/05/2021      Current Outpatient Medications  Medication Instructions   anastrozole (ARIMIDEX)  1 mg  Daily    Calcium Carb-Vit D (CALCIUM 1000 + D ) 2 tablets /Gummies Daily,    naproxen  220 mg Daily PRN   olmesartan-hctz 40-12.5 MG tablet TAKE 1 TAB DAILY    REPATHA 140 MG INJECT INTO SKIN EVERY 14 DAYS   VITAMIN D 5,000 Units Takes 1 capsule every other day.       Allergies  Allergen Reactions   Lescol [Fluvastatin Sodium] Other (See Comments)    Myalgia   Lipitor [Atorvastatin] Other (See Comments)    Increased LFT's   Vytorin [Ezetimibe-Simvastatin] Other (See Comments)    myalgia     PMHx:   Past Medical History:  Diagnosis Date   Allergic rhinitis, cause unspecified    Anemia  history of anemia while teenager   BRCA2 positive    Breast cancer (HCC) 2016   Left Breast Cancer   Breast cancer of upper-outer quadrant of left female breast (HCC)    chemo complete 01/2016, radiation 04/2016   DDD (degenerative disc disease)    Diverticulitis    Family history of breast cancer 08/30/2015   Dx. In maternal grandmother in her 41s-40;  dx. In maternal first cousin in her 50s-60s    Family history of colon cancer 08/30/2015   Dx. In maternal uncle in his 21s    Hyperlipidemia    Hypertension    Left kidney mass 04/06/2018   Mild growth from Ct 2013 to 04/06/2018;  Renal US 2020 no growth- will not follow up   Neuropathy    Osteopenia 12/2011   Spine T -0.4, Femur -2.1   Personal history of chemotherapy 2016   Left Breast Cancer   Personal history of radiation therapy 2016   Left Breast Cancer   PONV (postoperative nausea and vomiting)    Splenic artery aneurysm (HCC) 2013   COILS DONE   Vitamin D deficiency      Immunization History  Administered Date(s) Administered   DT  01/22/2015   Influenza Split 06/08/2014, 06/02/2016   Influenza, High Dose  06/01/2018, 06/02/2019, 06/01/2020   Influenza 06/14/2013, 06/04/2015, 05/27/2017   Meningococcal  09/02/2011   Moderna Sars-Covid-2 Vacc 10/17/2019, 11/15/2019   PFIZER-SARS-COV-2 Vacc 06/27/2020, 01/22/2021   PPD Test  01/18/2014   Pneumococcal - 13 12/31/2011, 06/17/2017   Pneumococcal - 23 09/02/2011   Pneumococcal - 23 09/22/1995, 09/02/2011   Td 11/27/1994, 04/29/2004, 01/22/2015     Past Surgical History:  Procedure Laterality Date   BREAST LUMPECTOMY Left 2016   BREAST LUMPECTOMY WITH RADIOACTIVE SEED AND SENTINEL LYMPH NODE BIOPSY Left 08/29/2015   Procedure: LEFT BREAST LUMPECTOMY WITH RADIOACTIVE SEED WITH AXILLARY SENTINEL LYMPH NODE BIOPSY;  Surgeon: Glenna Fellows, MD;  Location: [facility];  Service: General;  Laterality: Left;   CERVICAL DISC SURGERY  1998   COLONOSCOPY  Jan. 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   Dural AV fistula repair   IR ANGIO EXTERNAL CAROTID SEL EXT CAROTID BILAT MOD SED  02/03/2017   IR ANGIO EXTERNAL CAROTID SEL EXT CAROTID UNI R MOD SED  03/05/2017   IR ANGIO INTRA EXTRACRAN SEL COM CAROTID INNOMINATE UNI R MOD SED  03/05/2017   IR ANGIO INTRA EXTRACRAN SEL INTERNAL CAROTID BILAT MOD SED  02/03/2017   IR ANGIO VERTEBRAL SEL VERTEBRAL BILAT MOD SED  02/03/2017   IR ANGIOGRAM FOLLOW UP STUDY  03/05/2017   IR NEURO EACH ADD'L AFTER BASIC UNI RIGHT (MS)  02/03/2017   IR NEURO EACH ADD'L AFTER BASIC UNI RIGHT (MS)  03/05/2017   IR TRANSCATH/EMBOLIZ  03/05/2017   LAPAROSCOPIC BILATERAL SALPINGO OOPHERECTOMY Bilateral 05/16/2016   Procedure: LAPAROSCOPIC BILATERAL SALPINGO OOPHORECTOMY;  Surgeon: Huel Cote, MD;  Location: [facility];  Service: Gynecology;  Laterality: Bilateral;   LUMBAR DISC SURGERY  2001, 2010   PORTACATH PLACEMENT Bilateral 10/09/2015   Procedure: INSERTION PORT-A-CATH;  Surgeon: Glenna Fellows, MD;  Location: [facility];  Service: General;  Laterality: Bilateral;   Portacath removal     RADIOLOGY WITH ANESTHESIA N/A 03/05/2017   Procedure: ARTERIOGRAM ONYX EMBOLIZATION OF FISTULA;  Surgeon: Lisbeth Renshaw, MD;  Location[facility];  Service: Radiology;  Laterality: N/A;   RE-EXCISION OF BREAST LUMPECTOMY Left 09/07/2015   Procedure: RE-EXCISION OF  LEFT BREAST LUMPECTOMY;  Surgeon: Glenna Fellows, MD;  Location: [facility];  Service: General;  Laterality: Left;   SPINE SURGERY  1998   cervical diskectomy   SPINE SURGERY  2001, 2010   Lumbar diskectomy   splenic aneurysm  02/10/2012   Aneurysm of splenic artery, coil placed, Dr. Cory Roughen   TONSILLECTOMY  1968   VISCERAL ANGIOGRAM N/A 02/10/2012   Procedure: VISCERAL ANGIOGRAM;  Surgeon: Nada Libman, MD;  Location: [facility] CATH LAB;  Service: Cardiovascular;  Laterality: N/A;    FHx:    Reviewed / unchanged  SHx:    Reviewed / unchanged   Systems Review:  Constitutional: Denies fever, chills, wt changes, headaches, insomnia, fatigue, night sweats, change in appetite. Eyes: Denies redness, blurred vision, diplopia, discharge, itchy, watery eyes.  ENT: Denies discharge, congestion, post nasal drip, epistaxis, sore throat, earache, hearing loss, dental pain, tinnitus, vertigo, sinus pain, snoring.  CV: Denies chest pain, palpitations, irregular heartbeat, syncope, dyspnea, diaphoresis, orthopnea, PND, claudication or edema. Respiratory: denies cough, dyspnea, DOE, pleurisy, hoarseness, laryngitis, wheezing.  Gastrointestinal: Denies dysphagia, odynophagia, heartburn, reflux, water brash, abdominal pain or cramps, nausea, vomiting, bloating, diarrhea, constipation, hematemesis, melena, hematochezia  or hemorrhoids. Genitourinary: Denies dysuria, frequency, urgency, nocturia, hesitancy, discharge, hematuria or flank pain. Musculoskeletal: Denies arthralgias, myalgias, stiffness, jt. swelling, pain, limping or strain/sprain.  Skin: Denies pruritus, rash, hives, warts, acne, eczema or change in skin lesion(s). Neuro: No weakness, tremor, incoordination, spasms, paresthesia or pain. Psychiatric: Denies confusion, memory loss or sensory loss. Endo: Denies change in weight, skin or hair change.  Heme/Lymph: No excessive bleeding, bruising or enlarged lymph nodes.  Physical  Exam  BP 138/76   Pulse 71   Temp 97.9 F (36.6 C)   Resp 16   Ht 5' 3.75" (1.619 m)   Wt 163 lb 9.6 oz (74.2 kg)   SpO2 98%   BMI 28.30 kg/m   Appears  well nourished, well groomed  and in no distress.  Eyes: PERRLA, EOMs, conjunctiva no swelling or erythema. Sinuses: No frontal/maxillary tenderness ENT/Mouth: EAC's clear, TM's nl w/o erythema, bulging. Nares clear w/o erythema, swelling, exudates. Oropharynx clear without erythema or exudates. Oral hygiene is good. Tongue normal, non obstructing. Hearing intact.  Neck: Supple. Thyroid not palpable. Car 2+/2+ without bruits, nodes or JVD. Chest: Respirations nl with BS clear & equal w/o rales, rhonchi, wheezing or stridor.  Cor: Heart sounds normal w/ regular rate and rhythm without sig. murmurs, gallops, clicks or rubs. Peripheral pulses normal and equal  without edema.  Abdomen: Soft & bowel sounds normal. Non-tender w/o guarding, rebound, hernias, masses or organomegaly.  Lymphatics: Unremarkable.  Musculoskeletal: Full ROM all peripheral extremities, joint stability, 5/5 strength and normal gait.  Skin: Warm, dry without exposed rashes, lesions or ecchymosis apparent.  Neuro: Cranial nerves intact, reflexes equal bilaterally. Sensory-motor testing grossly intact. Tendon reflexes grossly intact.  Pysch: Alert & oriented x 3.  Insight and judgement nl & appropriate. No ideations.  Assessment and Plan:  1. Essential hypertension  - Continue medication, monitor blood pressure at home.  - Continue DASH diet.  Reminder to go to the ER if any CP,  SOB, nausea, dizziness, severe HA, changes vision/speech.    - CBC with Differential/Platelet - COMPLETE METABOLIC PANEL WITH GFR - Magnesium - TSH   2 . Hyperlipidemia, mixed  - Continue diet/meds, exercise,& lifestyle modifications.  - Continue monitor periodic cholesterol/liver & renal functions   - TSH  3. Abnormal glucose  - Continue diet, exercise  -  Lifestyle  modifications.  - Monitor appropriate labs   - A1c - Insulin, random   4. Vitamin D deficiency  - Continue supplementation.    5. Aortic atherosclerosis (HCC) by Abd CT scan in 2021   6. Medication management - CBC with Differential/Platelet - COMPLETE METABOLIC PANEL WITH GFR - Magnesium - TSH - A1c  - Insulin, random        Discussed  regular exercise, BP monitoring, weight control to achieve/maintain BMI less than 25 and discussed med and SE's. Recommended labs to assess and monitor clinical status with further disposition pending results of labs.  I discussed the assessment and treatment plan with the patient. The patient was provided an opportunity to ask questions and all were answered. The patient agreed with the plan and demonstrated an understanding of the instructions.  I provided over 30 minutes of exam, counseling, chart review and  complex critical decision making.        The patient was advised to call back or seek an in-person evaluation if the symptoms worsen or if the condition fails to improve as anticipated.   Marinus Maw, MD

## 2023-05-24 NOTE — Patient Instructions (Signed)

## 2023-05-25 ENCOUNTER — Ambulatory Visit (INDEPENDENT_AMBULATORY_CARE_PROVIDER_SITE_OTHER): Payer: Medicare Other | Admitting: Internal Medicine

## 2023-05-25 ENCOUNTER — Encounter: Payer: Self-pay | Admitting: Internal Medicine

## 2023-05-25 VITALS — BP 138/76 | HR 71 | Temp 97.9°F | Resp 16 | Ht 63.75 in | Wt 163.6 lb

## 2023-05-25 DIAGNOSIS — Z79899 Other long term (current) drug therapy: Secondary | ICD-10-CM

## 2023-05-25 DIAGNOSIS — E782 Mixed hyperlipidemia: Secondary | ICD-10-CM

## 2023-05-25 DIAGNOSIS — I10 Essential (primary) hypertension: Secondary | ICD-10-CM

## 2023-05-25 DIAGNOSIS — E559 Vitamin D deficiency, unspecified: Secondary | ICD-10-CM | POA: Diagnosis not present

## 2023-05-25 DIAGNOSIS — I700 Atherosclerosis of aorta: Secondary | ICD-10-CM | POA: Diagnosis not present

## 2023-05-25 DIAGNOSIS — R7309 Other abnormal glucose: Secondary | ICD-10-CM | POA: Diagnosis not present

## 2023-05-26 LAB — CBC WITH DIFFERENTIAL/PLATELET
Absolute Monocytes: 454 cells/uL (ref 200–950)
Basophils Absolute: 69 cells/uL (ref 0–200)
Basophils Relative: 1.1 %
Eosinophils Absolute: 132 cells/uL (ref 15–500)
Eosinophils Relative: 2.1 %
HCT: 38.5 % (ref 35.0–45.0)
Hemoglobin: 12.7 g/dL (ref 11.7–15.5)
Lymphs Abs: 857 cells/uL (ref 850–3900)
MCH: 30.6 pg (ref 27.0–33.0)
MCHC: 33 g/dL (ref 32.0–36.0)
MCV: 92.8 fL (ref 80.0–100.0)
MPV: 9.4 fL (ref 7.5–12.5)
Monocytes Relative: 7.2 %
Neutro Abs: 4788 cells/uL (ref 1500–7800)
Neutrophils Relative %: 76 %
Platelets: 338 10*3/uL (ref 140–400)
RBC: 4.15 10*6/uL (ref 3.80–5.10)
RDW: 12.3 % (ref 11.0–15.0)
Total Lymphocyte: 13.6 %
WBC: 6.3 10*3/uL (ref 3.8–10.8)

## 2023-05-26 LAB — COMPLETE METABOLIC PANEL WITH GFR
AG Ratio: 1.7 (calc) (ref 1.0–2.5)
ALT: 28 U/L (ref 6–29)
AST: 30 U/L (ref 10–35)
Albumin: 4.6 g/dL (ref 3.6–5.1)
Alkaline phosphatase (APISO): 70 U/L (ref 37–153)
BUN: 16 mg/dL (ref 7–25)
CO2: 23 mmol/L (ref 20–32)
Calcium: 9.3 mg/dL (ref 8.6–10.4)
Chloride: 97 mmol/L — ABNORMAL LOW (ref 98–110)
Creat: 0.67 mg/dL (ref 0.60–1.00)
Globulin: 2.7 g/dL (calc) (ref 1.9–3.7)
Glucose, Bld: 91 mg/dL (ref 65–99)
Potassium: 4.4 mmol/L (ref 3.5–5.3)
Sodium: 131 mmol/L — ABNORMAL LOW (ref 135–146)
Total Bilirubin: 0.5 mg/dL (ref 0.2–1.2)
Total Protein: 7.3 g/dL (ref 6.1–8.1)
eGFR: 92 mL/min/{1.73_m2} (ref >=60)

## 2023-05-26 LAB — LIPID PANEL
Cholesterol: 212 mg/dL — ABNORMAL HIGH (ref <200)
HDL: 73 mg/dL (ref >=50)
LDL Cholesterol (Calc): 121 mg/dL (calc) — ABNORMAL HIGH
Non-HDL Cholesterol (Calc): 139 mg/dL (calc) — ABNORMAL HIGH (ref <130)
Total CHOL/HDL Ratio: 2.9 (calc) (ref <5.0)
Triglycerides: 85 mg/dL (ref <150)

## 2023-05-26 LAB — HEMOGLOBIN A1C
Hgb A1c MFr Bld: 6 % of total Hgb — ABNORMAL HIGH (ref <5.7)
Mean Plasma Glucose: 126 mg/dL
eAG (mmol/L): 7 mmol/L

## 2023-05-26 LAB — TSH: TSH: 1.15 mIU/L (ref 0.40–4.50)

## 2023-05-26 LAB — MAGNESIUM: Magnesium: 2.2 mg/dL (ref 1.5–2.5)

## 2023-05-26 LAB — VITAMIN D 25 HYDROXY (VIT D DEFICIENCY, FRACTURES): Vit D, 25-Hydroxy: 33 ng/mL (ref 30–100)

## 2023-05-26 LAB — INSULIN, RANDOM: Insulin: 13.6 u[IU]/mL

## 2023-05-26 NOTE — Progress Notes (Signed)
<>*<>*<>*<>*<>*<>*<>*<>*<>*<>*<>*<>*<>*<>*<>*<>*<>*<>*<>*<>*<>*<>*<>*<>*<> <>*<>*<>*<>*<>*<>*<>*<>*<>*<>*<>*<>*<>*<>*<>*<>*<>*<>*<>*<>*<>*<>*<>*<>*<>  -Test results slightly outside the reference range are not unusual. If there is anything important, I will review this with you,  otherwise it is considered normal test values.  If you have further questions,  please do not hesitate to contact me at the office or via My Chart.   <>*<>*<>*<>*<>*<>*<>*<>*<>*<>*<>*<>*<>*<>*<>*<>*<>*<>*<>*<>*<>*<>*<>*<>*<> <>*<>*<>*<>*<>*<>*<>*<>*<>*<>*<>*<>*<>*<>*<>*<>*<>*<>*<>*<>*<>*<>*<>*<>*<>   -  Total  Chol =   212   - Elevated             (  Ideal  or  Goal is less than 180  !  )  & -  Bad / Dangerous LDL  Chol =   121 - also Elevated              (  Ideal  or  Goal is less than 70  !  )  - Cholesterol is too high - Recommend STRICTER low cholesterol diet   - Cholesterol only comes from animal sources                                                                           - ie. meat, dairy, egg yolks  - Eat all the vegetables you want.  - Avoid Meat, Avoid Meat,  Avoid Meat                                                              - especially Red Meat - Beef AND Pork .  - Avoid cheese & dairy - milk & ice cream.     - Cheese is the most concentrated form of trans-fats which                                                                              is the worst thing to clog up our arteries.    - Veggie cheese is OK which can be found in the fresh                                                produce section at [employer] or [employer] or [employer]  <>*<>*<>*<>*<>*<>*<>*<>*<>*<>*<>*<>*<>*<>*<>*<>*<>*<>*<>*<>*<>*<>*<>*<>*<> <>*<>*<>*<>*<>*<>*<>*<>*<>*<>*<>*<>*<>*<>*<>*<>*<>*<>*<>*<>*<>*<>*<>*<>*<>  -  A1c = 6.0% - = 12 week average blood sugar is too high /    Blood sugar and A1c are elevated in the borderline and  early or  pre-diabetes range which has the same   300% increased risk for heart attack, stroke, cancer and                                         alzheimer- type vascular dementia as full blown diabetes.   But the good news is that diet, exercise with                                                  weight loss can cure the early diabetes at this point.  <>*<>*<>*<>*<>*<>*<>*<>*<>*<>*<>*<>*<>*<>*<>*<>*<>*<>*<>*<>*<>*<>*<>*<>*<> <>*<>*<>*<>*<>*<>*<>*<>*<>*<>*<>*<>*<>*<>*<>*<>*<>*<>*<>*<>*<>*<>*<>*<>*<>  -  It is very important that you work harder with diet by                             avoiding all foods that are white except chicken,  fish & calliflower.  - Avoid white rice  (brown & wild rice is OK),   - Avoid white potatoes  (sweet potatoes in moderation is OK),   White bread or wheat bread or anything made out of   white flour like bagels, donuts, rolls, buns, biscuits, cakes,  - pastries, cookies, pizza crust, and pasta (made from white flour & egg whites)   - vegetarian pasta or spinach or wheat pasta is OK.  - Multigrain breads like Arnold's, Pepperidge Farm or                                              multigrain sandwich thins or high fiber breads like  Eureka bread or "Dave's Killer" breads that are 4 to 5 grams fiber per slice !  are best.    Diet, exercise and weight loss can reverse and cure  diabetes in the early stages.    <>*<>*<>*<>*<>*<>*<>*<>*<>*<>*<>*<>*<>*<>*<>*<>*<>*<>*<>*<>*<>*<>*<>*<>*<> <>*<>*<>*<>*<>*<>*<>*<>*<>*<>*<>*<>*<>*<>*<>*<>*<>*<>*<>*<>*<>*<>*<>*<>*<>  -  Vitamin D = 33 is Very very LOW  !  - Vitamin D goal is between 70-100.   - Please INCREASE your Vitamin D to   5,000 units EVERY day !   - It is very important as a natural anti-inflammatory and helping the                           immune system protect against viral infections, like the Covid-19    helping hair, skin, and nails, as well as reducing stroke and heart attack risk.   - It  helps your bones and helps with mood.  - It also decreases numerous cancer risks so please                                                                                           take it as directed.   - Low Vit D  is associated with a 200-300% higher risk for CANCER   and 200-300% higher risk for HEART   ATTACK  &  STROKE.    - It is also associated with higher death rate at younger ages,   autoimmune diseases like Rheumatoid arthritis, Lupus, Multiple Sclerosis.     - Also many other serious conditions, like depression, Alzheimer's  Dementia,  muscle aches, fatigue, fibromyalgia   <>*<>*<>*<>*<>*<>*<>*<>*<>*<>*<>*<>*<>*<>*<>*<>*<>*<>*<>*<>*<>*<>*<>*<>*<> <>*<>*<>*<>*<>*<>*<>*<>*<>*<>*<>*<>*<>*<>*<>*<>*<>*<>*<>*<>*<>*<>*<>*<>*<>  -  All Else - CBC - Kidneys - Electrolytes - Liver - Magnesium & Thyroid    - all  Normal / OK  <>*<>*<>*<>*<>*<>*<>*<>*<>*<>*<>*<>*<>*<>*<>*<>*<>*<>*<>*<>*<>*<>*<>*<>*<> <>*<>*<>*<>*<>*<>*<>*<>*<>*<>*<>*<>*<>*<>*<>*<>*<>*<>*<>*<>*<>*<>*<>*<>*<>

## 2023-05-31 ENCOUNTER — Other Ambulatory Visit: Payer: Self-pay | Admitting: Hematology and Oncology

## 2023-05-31 DIAGNOSIS — Z23 Encounter for immunization: Secondary | ICD-10-CM | POA: Diagnosis not present

## 2023-06-09 ENCOUNTER — Other Ambulatory Visit: Payer: Self-pay | Admitting: Nurse Practitioner

## 2023-06-09 DIAGNOSIS — I10 Essential (primary) hypertension: Secondary | ICD-10-CM

## 2023-08-31 NOTE — Progress Notes (Signed)
 [place]      ADULT   &   ADOLESCENT      INTERNAL MEDICINE  Elsie Richards, M.D.          Lonell Rous, ANP        Bascom Necessary, FNP  [facility] [address]  [place], [place]. [address] Telephone [phone] Telefax [phone]   Future Appointments  Date Time Provider Department  09/01/2023                     9 mo  2:30 PM Richards Elsie, MD [facility]  12/01/2023                     cpe  2:00 PM Necessary Bascom, NP [facility]  03/03/2024                    wellness  3:30 PM Necessary Bascom, NP [facility]  03/31/2024  2:30 PM Odean Potts, MD [facility]  06/06/2024                     3 mo   2:30 PM Richards Elsie, MD [facility]    History of Present Illness:       This very nice 72 y.o. MWF presents for 9 month follow up with HTN, HLD, Pre-Diabetes and Vitamin D  Deficiency. Patient is  on Arimidex  followed by Dr Gudena  for hx/o Lt Breast Lumpectomy (2016) tx'd Chemo-radiation. Abd CT scan in 2021 revealed Aortic atherosclerosis.       Patient is treated for HTN (1996) & BP has been controlled and today's BP is  134/70. Patient has had no complaints of any cardiac type chest pain, palpitations, dyspnea chet /PND, dizziness, claudication or dependent edema.       Patient has hx/o  Statin myopathy and is on PCSK9i /Repatha  q2weeks. Hyperlipidemia is controlled with diet & meds. Patient denies myalgias or other med SE's.  Last Lipids were at goal :  Lab Results  Component Value Date   CHOL 212 (H) 05/25/2023   HDL 73 05/25/2023   LDLCALC 121 (H) 05/25/2023   TRIG 85 05/25/2023   CHOLHDL 2.9 05/25/2023     Also, the patient has history of PreDiabetes (A1c 6.2% /2016 and 5.7% /2016) and has had no symptoms of reactive hypoglycemia, diabetic polys, paresthesias or visual blurring.   Last A1c was not  at goal :  Lab Results  Component Value Date   HGBA1C 6.0 (H) 05/25/2023                                                            Further, the patient also has history of Vitamin D  Deficiency (29 /2016) and supplements vitamin D  without any suspected side-effects. Last vitamin D  was low :  Lab Results  Component Value Date   VD25OH 33 05/25/2023       Current Outpatient Medications  Medication Instructions   anastrozole  (ARIMIDEX ) 1 mg, Oral, Daily   CALCIUM  1000 + D  2  Gummies Daily   REPATHA   140 MG/ML INJECT  INTO SKIN EVERY 14 DAYS   Magnesium 250 MG TABS Oral, Daily   Multiple Vitamin  1 tablet   Daily   naproxen  sodium  220 mg Daily  PRN   olmesartan -hctz 40-12.5 MG t TAKE 1 TAB DAILY    VITAMIN D   5,000 Units  Takes 1 capsule every other day.      Allergies  Allergen Reactions   Lescol [Fluvastatin Sodium] Other (See Comments)    Myalgia   Lipitor [Atorvastatin] Other (See Comments)    Increased LFT's   Vytorin [Ezetimibe -Simvastatin] Other (See Comments)    myalgia     PMHx:   Past Medical History:  Diagnosis Date   Allergic rhinitis    Anemia    history of anemia while teenager   BRCA2 positive    Breast cancer (HCC) 2016   Left Breast Cancer   Breast cancer of upper-outer quadrant of left female breast (HCC)    chemo complete 01/2016, radiation 04/2016   DDD (degenerative disc disease)    Diverticulitis    Family history of breast cancer 08/30/2015   Dx. In maternal grandmother in her 48s-40;  dx. In maternal first cousin in her 43s-60s    Family history of colon cancer 08/30/2015   Dx. In maternal uncle in his 25s    Hyperlipidemia    Hypertension    Left kidney mass 04/06/2018   Mild growth from Ct 2013 to 04/06/2018;  Renal US  2020 no growth- will not follow up   Neuropathy    Osteopenia 12/2011   Spine T -0.4, Femur -2.1   Personal history of chemotherapy 2016   Left Breast Cancer   Personal history of radiation therapy 2016   Left Breast Cancer   PONV (postoperative nausea and vomiting)    Splenic artery aneurysm (HCC) 2013   COILS DONE   Vitamin D   deficiency      Immunization History  Administered Date(s) Administered   DT  01/22/2015   Influenza Split 06/08/2014, 06/02/2016   Influenza, High Dose  06/01/2018, 06/02/2019, 06/01/2020   Influenza 06/14/2013, 06/04/2015, 05/27/2017   Meningococcal  09/02/2011   Moderna Sars-Covid-2 Vacc 10/17/2019, 11/15/2019   PFIZER-SARS-COV-2 Vacc 06/27/2020, 01/22/2021   PPD Test 01/18/2014   Pneumococcal - 13 12/31/2011, 06/17/2017   Pneumococcal - 23 09/02/2011   Pneumococcal - 23 09/22/1995, 09/02/2011   Td 11/27/1994, 04/29/2004, 01/22/2015     Past Surgical History:  Procedure Laterality Date   BREAST LUMPECTOMY Left 2016   BREAST LUMPECTOMY WITH RADIOACTIVE SEED AND SENTINEL LYMPH NODE BIOPSY Left 08/29/2015   Procedure: LEFT BREAST LUMPECTOMY WITH RADIOACTIVE SEED WITH AXILLARY SENTINEL LYMPH NODE BIOPSY;  Surgeon: Morene Olives, MD;  Location: [facility];  Service: General;  Laterality: Left;   CERVICAL DISC SURGERY  1998   COLONOSCOPY  Jan. 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   Dural AV fistula repair   IR ANGIO EXTERNAL CAROTID SEL EXT CAROTID BILAT MOD SED  02/03/2017   IR ANGIO EXTERNAL CAROTID SEL EXT CAROTID UNI R MOD SED  03/05/2017   IR ANGIO INTRA EXTRACRAN SEL COM CAROTID INNOMINATE UNI R MOD SED  03/05/2017   IR ANGIO INTRA EXTRACRAN SEL INTERNAL CAROTID BILAT MOD SED  02/03/2017   IR ANGIO VERTEBRAL SEL VERTEBRAL BILAT MOD SED  02/03/2017   IR ANGIOGRAM FOLLOW UP STUDY  03/05/2017   IR NEURO EACH ADD'L AFTER BASIC UNI RIGHT (MS)  02/03/2017   IR NEURO EACH ADD'L AFTER BASIC UNI RIGHT (MS)  03/05/2017   IR TRANSCATH/EMBOLIZ  03/05/2017   LAPAROSCOPIC BILATERAL SALPINGO OOPHERECTOMY Bilateral 05/16/2016   Procedure: LAPAROSCOPIC BILATERAL SALPINGO OOPHORECTOMY;  Surgeon: Nathanel Bunker, MD;  Location: [facility]  ORS;  Service: Gynecology;  Laterality: Bilateral;   LUMBAR DISC SURGERY  2001, 2010   PORTACATH PLACEMENT Bilateral 10/09/2015   Procedure: INSERTION PORT-A-CATH;   Surgeon: Morene Olives, MD;  Location: [facility] ORS;  Service: General;  Laterality: Bilateral;   Portacath removal     RADIOLOGY WITH ANESTHESIA N/A 03/05/2017   Procedure: ARTERIOGRAM ONYX EMBOLIZATION OF FISTULA;  Surgeon: Lanis Pupa, MD;  Location: [facility] OR;  Service: Radiology;  Laterality: N/A;   RE-EXCISION OF BREAST LUMPECTOMY Left 09/07/2015   Procedure: RE-EXCISION OF LEFT BREAST LUMPECTOMY;  Surgeon: Morene Olives, MD;  Location: [facility];  Service: General;  Laterality: Left;   SPINE SURGERY  1998   cervical diskectomy   SPINE SURGERY  2001, 2010   Lumbar diskectomy   splenic aneurysm  02/10/2012   Aneurysm of splenic artery, coil placed, Dr. Army   TONSILLECTOMY  1968   VISCERAL ANGIOGRAM N/A 02/10/2012   Procedure: VISCERAL ANGIOGRAM;  Surgeon: Gaile LELON New, MD;  Location: [facility] CATH LAB;  Service: Cardiovascular;  Laterality: N/A;    FHx:    Reviewed / unchanged  SHx:    Reviewed / unchanged   Systems Review:  Constitutional: Denies fever, chills, wt changes, headaches, insomnia, fatigue, night sweats, change in appetite. Eyes: Denies redness, blurred vision, diplopia, discharge, itchy, watery eyes.  ENT: Denies discharge, congestion, post nasal drip, epistaxis, sore throat, earache, hearing loss, dental pain, tinnitus, vertigo, sinus pain, snoring.  CV: Denies chest pain, palpitations, irregular heartbeat, syncope, dyspnea, diaphoresis, orthopnea, PND, claudication or edema. Respiratory: denies cough, dyspnea, DOE, pleurisy, hoarseness, laryngitis, wheezing.  Gastrointestinal: Denies dysphagia, odynophagia, heartburn, reflux, water brash, abdominal pain or cramps, nausea, vomiting, bloating, diarrhea, constipation, hematemesis, melena, hematochezia  or hemorrhoids. Genitourinary: Denies dysuria, frequency, urgency, nocturia, hesitancy, discharge, hematuria or flank pain. Musculoskeletal: Denies arthralgias, myalgias, stiffness, jt. swelling, pain,  limping or strain/sprain.  Skin: Denies pruritus, rash, hives, warts, acne, eczema or change in skin lesion(s). Neuro: No weakness, tremor, incoordination, spasms, paresthesia or pain. Psychiatric: Denies confusion, memory loss or sensory loss. Endo: Denies change in weight, skin or hair change.  Heme/Lymph: No excessive bleeding, bruising or enlarged lymph nodes.  Physical Exam  BP 134/70   Pulse 72   Temp 97.9 F (36.6 C)   Resp 16   Ht 5' (1.524 m)   Wt 162 lb 9.6 oz (73.8 kg)   SpO2 99%   BMI 31.76 kg/m   Appears  well nourished, well groomed  and in no distress.  Eyes: PERRLA, EOMs, conjunctiva no swelling or erythema. Sinuses: No frontal/maxillary tenderness ENT/Mouth: EAC's clear, TM's nl w/o erythema, bulging. Nares clear w/o erythema, swelling, exudates. Oropharynx clear without erythema or exudates. Oral hygiene is good. Tongue normal, non obstructing. Hearing intact.  Neck: Supple. Thyroid  not palpable. Car 2+/2+ without bruits, nodes or JVD. Chest: Respirations nl with BS clear & equal w/o rales, rhonchi, wheezing or stridor.  Cor: Heart sounds normal w/ regular rate and rhythm without sig. murmurs, gallops, clicks or rubs. Peripheral pulses normal and equal  without edema.  Abdomen: Soft & bowel sounds normal. Non-tender w/o guarding, rebound, hernias, masses or organomegaly.  Lymphatics: Unremarkable.  Musculoskeletal: Full ROM all peripheral extremities, joint stability, 5/5 strength and normal gait.  Skin: Warm, dry without exposed rashes, lesions or ecchymosis apparent.  Neuro: Cranial nerves intact, reflexes equal bilaterally. Sensory-motor testing grossly intact. Tendon reflexes grossly intact.  Pysch: Alert & oriented x 3.  Insight and judgement nl & appropriate. No ideations.  Assessment and Plan:  1. Essential hypertension  - Continue medication, monitor blood pressure at home.  - Continue DASH diet.  Reminder to go to the ER if any CP,  SOB, nausea,  dizziness, severe HA, changes vision/speech.    - CBC with Differential/Platelet - COMPLETE METABOLIC PANEL WITH GFR - Magnesium - TSH   2 . Hyperlipidemia, mixed  - Continue diet/meds, exercise,& lifestyle modifications.  - Continue monitor periodic cholesterol/liver & renal functions   - TSH  3. Abnormal glucose  - Continue diet, exercise  - Lifestyle modifications.  - Monitor appropriate labs   - A1c - Insulin , random   4. Vitamin D  deficiency  - Continue supplementation.    5. Aortic atherosclerosis (HCC) by Abd CT scan in 2021   6. Medication management - CBC with Differential/Platelet - COMPLETE METABOLIC PANEL WITH GFR - Magnesium - TSH - A1c  - Insulin , random        Discussed  regular exercise, BP monitoring, weight control to achieve/maintain BMI less than 25 and discussed med and SE's. Recommended labs to assess and monitor clinical status with further disposition pending results of labs.  I discussed the assessment and treatment plan with the patient. The patient was provided an opportunity to ask questions and all were answered. The patient agreed with the plan and demonstrated an understanding of the instructions.  I provided over 30 minutes of exam, counseling, chart review and  complex critical decision making.        The patient was advised to call back or seek an in-person evaluation if the symptoms worsen or if the condition fails to improve as anticipated.   Elsie JONETTA Richards, MD

## 2023-09-01 ENCOUNTER — Ambulatory Visit (INDEPENDENT_AMBULATORY_CARE_PROVIDER_SITE_OTHER): Payer: Medicare Other | Admitting: Internal Medicine

## 2023-09-01 VITALS — BP 134/70 | HR 72 | Temp 97.9°F | Resp 16 | Ht 60.0 in | Wt 162.6 lb

## 2023-09-01 DIAGNOSIS — E559 Vitamin D deficiency, unspecified: Secondary | ICD-10-CM

## 2023-09-01 DIAGNOSIS — Z79899 Other long term (current) drug therapy: Secondary | ICD-10-CM | POA: Diagnosis not present

## 2023-09-01 DIAGNOSIS — I700 Atherosclerosis of aorta: Secondary | ICD-10-CM

## 2023-09-01 DIAGNOSIS — R7309 Other abnormal glucose: Secondary | ICD-10-CM

## 2023-09-01 DIAGNOSIS — E782 Mixed hyperlipidemia: Secondary | ICD-10-CM | POA: Diagnosis not present

## 2023-09-01 DIAGNOSIS — I10 Essential (primary) hypertension: Secondary | ICD-10-CM | POA: Diagnosis not present

## 2023-09-01 NOTE — Patient Instructions (Signed)
Due to recent changes in healthcare laws, you may see the results of your imaging and laboratory studies on MyChart before your provider has had a chance to review them.  We understand that in some cases there may be results that are confusing or concerning to you. Not all laboratory results come back in the same time frame and the provider may be waiting for multiple results in order to interpret others.  Please give [place] 48 hours in order for your provider to thoroughly review all the results before contacting the office for clarification of your results.  ++++++++++++++++++++++++++  Vit D  & Vit C 1,000 mg   are recommended to help protect  against the Covid-19 and other Corona viruses.    Also it's recommended  to take  Zinc 50 mg  x 1/2 tablet = 25 mg  / day  to help  protect against the Covid-19   and best place to get  is also on [email or web address]  and don't pay more than 6-8 cents /pill !   +++++++++++++++++++++++++++++++++++++++ Recommend Adult Low Dose Aspirin or  coated  Aspirin 81 mg daily  To reduce risk of Colon Cancer 40 %,  Skin Cancer 26 % ,  Melanoma 46%  and  Pancreatic cancer 60% +++++++++++++++++++++++++++++++++++++++++ Vitamin D goal  is between 70-100.  Please make sure that you are taking your Vitamin D as directed.  It is very important as a natural anti-inflammatory  helping hair, skin, and nails, as well as reducing stroke and heart attack risk.  It helps your bones and helps with mood. It also decreases numerous cancer risks so please take it as directed.  Low Vit D is associated with a 200-300% higher risk for CANCER  and 200-300% higher risk for HEART   ATTACK  &  STROKE.   .....................................Marland Kitchen It is also associated with higher death rate at younger ages,  autoimmune diseases like Rheumatoid arthritis, Lupus, Multiple Sclerosis.    Also many other serious conditions, like depression, Alzheimer's Dementia, infertility, muscle aches,  fatigue, fibromyalgia - just to name a few. +++++++++++++++++++++++++++++++++++++++++ Recommend the book "The END of DIETING" by Dr Monico Hoar  & the book "The END of DIABETES " by Dr Monico Hoar At [email or web address] - get book & Audio CD's    Being diabetic has a  300% increased risk for heart attack, stroke, cancer, and alzheimer- type vascular dementia. It is very important that you work harder with diet by avoiding all foods that are white. Avoid white rice (brown & wild rice is OK), white potatoes (sweetpotatoes in moderation is OK), White bread or wheat bread or anything made out of white flour like bagels, donuts, rolls, buns, biscuits, cakes, pastries, cookies, pizza crust, and pasta (made from white flour & egg whites) - vegetarian pasta or spinach or wheat pasta is OK. Multigrain breads like Arnold's or Pepper[employer], or multigrain sandwich thins or flatbreads.  Diet, exercise and weight loss can reverse and cure diabetes in the early stages.  Diet, exercise and weight loss is very important in the control and prevention of complications of diabetes which affects every system in your body, ie. Brain - dementia/stroke, eyes - glaucoma/blindness, heart - heart attack/heart failure, kidneys - dialysis, stomach - gastric paralysis, intestines - malabsorption, nerves - severe painful neuritis, circulation - gangrene & loss of a leg(s), and finally cancer and Alzheimers.    I recommend avoid fried & greasy foods,  sweets/candy, white rice (brown or wild rice or Quinoa  is OK), white potatoes (sweet potatoes are OK) - anything made from white flour - bagels, doughnuts, rolls, buns, biscuits,white and wheat breads, pizza crust and traditional pasta made of white flour & egg white(vegetarian pasta or spinach or wheat pasta is OK).  Multi-grain bread is OK - like multi-grain flat bread or sandwich thins. Avoid alcohol in excess. Exercise is also important.    Eat all the vegetables you want - avoid meat,  especially red meat and dairy - especially cheese.  Cheese is the most concentrated form of trans-fats which is the worst thing to clog up our arteries. Veggie cheese is OK which can be found in the fresh produce section at [employer] or [employer] or [employer]  +++++++++++++++++++++++++++++++++++++++ DASH Eating Plan  DASH stands for "Dietary Approaches to Stop Hypertension."   The DASH eating plan is a healthy eating plan that has been shown to reduce high blood pressure (hypertension). Additional health benefits may include reducing the risk of type 2 diabetes mellitus, heart disease, and stroke. The DASH eating plan may also help with weight loss. WHAT DO I NEED TO KNOW ABOUT THE DASH EATING PLAN? For the DASH eating plan, you will follow these general guidelines: Choose foods with a percent daily value for sodium of less than 5% (as listed on the food label). Use salt-free seasonings or herbs instead of table salt or sea salt. Check with your health care provider or pharmacist before using salt substitutes. Eat lower-sodium products, often labeled as "lower sodium" or "no salt added." Eat fresh foods. Eat more vegetables, fruits, and low-fat dairy products. Choose whole grains. Look for the word "whole" as the first word in the ingredient list. Choose fish  Limit sweets, desserts, sugars, and sugary drinks. Choose heart-healthy fats. Eat veggie cheese  Eat more home-cooked food and less restaurant, buffet, and fast food. Limit fried foods. Cook foods using methods other than frying. Limit canned vegetables. If you do use them, rinse them well to decrease the sodium. When eating at a restaurant, ask that your food be prepared with less salt, or no salt if possible.                      WHAT FOODS CAN I EAT? Read Dr Francis Dowse Fuhrman's books on The End of Dieting & The End of Diabetes  Grains Whole grain or whole wheat bread. Brown rice. Whole grain or whole wheat pasta. Quinoa,  bulgur, and whole grain cereals. Low-sodium cereals. Corn or whole wheat flour tortillas. Whole grain cornbread. Whole grain crackers. Low-sodium crackers.  Vegetables Fresh or frozen vegetables (raw, steamed, roasted, or grilled). Low-sodium or reduced-sodium tomato and vegetable juices. Low-sodium or reduced-sodium tomato sauce and paste. Low-sodium or reduced-sodium canned vegetables.   Fruits All fresh, canned (in natural juice), or frozen fruits.  Protein Products  All fish and seafood.  Dried beans, peas, or lentils. Unsalted nuts and seeds. Unsalted canned beans.  Dairy Low-fat dairy products, such as skim or 1% milk, 2% or reduced-fat cheeses, low-fat ricotta or cottage cheese, or plain low-fat yogurt. Low-sodium or reduced-sodium cheeses.  Fats and Oils Tub margarines without trans fats. Light or reduced-fat mayonnaise and salad dressings (reduced sodium). Avocado. Safflower, olive, or canola oils. Natural peanut or almond butter.  Other Unsalted popcorn and pretzels. The items listed above may not be a complete list of recommended foods or beverages. Contact your dietitian for more options.  +++++++++++++++  WHAT FOODS ARE NOT RECOMMENDED? Grains/ White flour  or wheat flour White bread. White pasta. White rice. Refined cornbread. Bagels and croissants. Crackers that contain trans fat.  Vegetables  Creamed or fried vegetables. Vegetables in a . Regular canned vegetables. Regular canned tomato sauce and paste. Regular tomato and vegetable juices.  Fruits Dried fruits. Canned fruit in light or heavy syrup. Fruit juice.  Meat and Other Protein Products Meat in general - RED meat & White meat.  Fatty cuts of meat. Ribs, chicken wings, all processed meats as bacon, sausage, bologna, salami, fatback, hot dogs, bratwurst and packaged luncheon meats.  Dairy Whole or 2% milk, cream, half-and-half, and cream cheese. Whole-fat or sweetened yogurt. Full-fat cheeses or blue cheese.  Non-dairy creamers and whipped toppings. Processed cheese, cheese spreads, or cheese curds.  Condiments Onion and garlic salt, seasoned salt, table salt, and sea salt. Canned and packaged gravies. Worcestershire sauce. Tartar sauce. Barbecue sauce. Teriyaki sauce. Soy sauce, including reduced sodium. Steak sauce. Fish sauce. Oyster sauce. Cocktail sauce. Horseradish. Ketchup and mustard. Meat flavorings and tenderizers. Bouillon cubes. Hot sauce. Tabasco sauce. Marinades. Taco seasonings. Relishes.  Fats and Oils Butter, stick margarine, lard, shortening and bacon fat. Coconut, palm kernel, or palm oils. Regular salad dressings.  Pickles and olives. Salted popcorn and pretzels.  The items listed above may not be a complete list of foods and beverages to avoid.

## 2023-09-02 LAB — CBC WITH DIFFERENTIAL/PLATELET
Absolute Lymphocytes: 884 {cells}/uL (ref 850–3900)
Absolute Monocytes: 410 {cells}/uL (ref 200–950)
Basophils Absolute: 91 {cells}/uL (ref 0–200)
Basophils Relative: 1.4 %
Eosinophils Absolute: 150 {cells}/uL (ref 15–500)
Eosinophils Relative: 2.3 %
HCT: 38.7 % (ref 35.0–45.0)
Hemoglobin: 12.7 g/dL (ref 11.7–15.5)
MCH: 30.4 pg (ref 27.0–33.0)
MCHC: 32.8 g/dL (ref 32.0–36.0)
MCV: 92.6 fL (ref 80.0–100.0)
MPV: 9.6 fL (ref 7.5–12.5)
Monocytes Relative: 6.3 %
Neutro Abs: 4966 {cells}/uL (ref 1500–7800)
Neutrophils Relative %: 76.4 %
Platelets: 327 10*3/uL (ref 140–400)
RBC: 4.18 10*6/uL (ref 3.80–5.10)
RDW: 12.5 % (ref 11.0–15.0)
Total Lymphocyte: 13.6 %
WBC: 6.5 10*3/uL (ref 3.8–10.8)

## 2023-09-02 LAB — COMPLETE METABOLIC PANEL WITH GFR
AG Ratio: 1.6 (calc) (ref 1.0–2.5)
ALT: 29 U/L (ref 6–29)
AST: 29 U/L (ref 10–35)
Albumin: 4.5 g/dL (ref 3.6–5.1)
Alkaline phosphatase (APISO): 45 U/L (ref 37–153)
BUN: 21 mg/dL (ref 7–25)
CO2: 25 mmol/L (ref 20–32)
Calcium: 9.5 mg/dL (ref 8.6–10.4)
Chloride: 97 mmol/L — ABNORMAL LOW (ref 98–110)
Creat: 0.75 mg/dL (ref 0.60–1.00)
Globulin: 2.8 g/dL (ref 1.9–3.7)
Glucose, Bld: 81 mg/dL (ref 65–99)
Potassium: 4.2 mmol/L (ref 3.5–5.3)
Sodium: 132 mmol/L — ABNORMAL LOW (ref 135–146)
Total Bilirubin: 0.5 mg/dL (ref 0.2–1.2)
Total Protein: 7.3 g/dL (ref 6.1–8.1)
eGFR: 83 mL/min/{1.73_m2} (ref >=60)

## 2023-09-02 LAB — LIPID PANEL
Cholesterol: 177 mg/dL (ref <200)
HDL: 69 mg/dL (ref >=50)
LDL Cholesterol (Calc): 87 mg/dL
Non-HDL Cholesterol (Calc): 108 mg/dL (ref <130)
Total CHOL/HDL Ratio: 2.6 (calc) (ref <5.0)
Triglycerides: 116 mg/dL (ref <150)

## 2023-09-02 LAB — INSULIN, RANDOM: Insulin: 18 u[IU]/mL

## 2023-09-02 LAB — HEMOGLOBIN A1C
Hgb A1c MFr Bld: 6 %{Hb} — ABNORMAL HIGH (ref <5.7)
Mean Plasma Glucose: 126 mg/dL
eAG (mmol/L): 7 mmol/L

## 2023-09-02 LAB — TSH: TSH: 1 m[IU]/L (ref 0.40–4.50)

## 2023-09-02 LAB — MAGNESIUM: Magnesium: 1.9 mg/dL (ref 1.5–2.5)

## 2023-09-02 LAB — VITAMIN D 25 HYDROXY (VIT D DEFICIENCY, FRACTURES): Vit D, 25-Hydroxy: 38 ng/mL (ref 30–100)

## 2023-09-02 NOTE — Progress Notes (Signed)
 [] [] [] [] [] [] [] [] [] [] [] [] [] [] [] [] [] [] [] [] [] [] [] [] [] [] [] [] [] [] [] [] [] [] [] [] [] [] [] [] [] ][] [] [] [] [] [] [] [] [] [] [] [] [] [] [] [] [] [] [] [] [] [] [[] [] [] [] []  [] [] [] [] [] [] [] [] [] [] [] [] [] [] [] [] [] [] [] [] [] [] [] [] [] [] [] [] [] [] [] [] [] [] [] [] [] [] [] [] [] ][] [] [] [] [] [] [] [] [] [] [] [] [] [] [] [] [] [] [] [] [] [] [[] [] [] [] []  -Test results slightly outside the reference range are not unusual. If there is anything important, I will review this with you,  otherwise it is considered normal test values.  If you have further questions,  please do not hesitate to contact me at the office or via My Chart.  [] [] [] [] [] [] [] [] [] [] [] [] [] [] [] [] [] [] [] [] [] [] [] [] [] [] [] [] [] [] [] [] [] [] [] [] [] [] [] [] [] ][] [] [] [] [] [] [] [] [] [] [] [] [] [] [] [] [] [] [] [] [] [] [[] [] [] [] []  [] [] [] [] [] [] [] [] [] [] [] [] [] [] [] [] [] [] [] [] [] [] [] [] [] [] [] [] [] [] [] [] [] [] [] [] [] [] [] [] [] ][] [] [] [] [] [] [] [] [] [] [] [] [] [] [] [] [] [] [] [] [] [] [[] [] [] [] []   -  Sodium = 132 - tends to run on the low side of Normal, But                                                                                   appears to be your Normal & OK   [] [] [] [] [] [] [] [] [] [] [] [] [] [] [] [] [] [] [] [] [] [] [] [] [] [] [] [] [] [] [] [] [] [] [] [] [] [] [] [] [] ][] [] [] [] [] [] [] [] [] [] [] [] [] [] [] [] [] [] [] [] [] [] [[] [] [] [] []   -  A1c = 6.0% Blood sugar and A1c are STILL elevated in the borderline and                                                              early or pre-diabetes range which has the same   300% increased risk for heart attack, stroke, cancer and                                                alzheimer- type vascular dementia as full blown diabetes.   But the good news is that diet, exercise with                                                          weight loss can cure the early diabetes at this point.  -  It is very important that you work harder with diet by                                     avoiding all foods that are white except chicken, fish & calliflower.  - Avoid white rice  (brown & wild rice is OK),   - Avoid white potatoes  (sweet potatoes in  moderation is OK),   White bread or wheat bread or anything made out of                                                  white flour like bagels, donuts, rolls, buns, biscuits, cakes,  - pastries, cookies, pizza crust, and pasta (made from white flour & egg whites)   - vegetarian pasta or spinach or wheat pasta is OK.  - Multigrain breads like Arnold's, Pepper[employer] or  multigrain sandwich thins or high fiber breads like   Eureka bread or Dave[employer] Killer breads that are 4 to 5 grams fiber per slice !  are best.    Diet, exercise and weight loss can reverse and cure diabetes in the early stages.    - Diet, exercise and weight loss is very important in the   control and prevention of complications of diabetes which   affects every system in your body, ie.   -Brain - dementia/stroke,  - eyes - glaucoma/blindness,  - heart - heart attack/heart failure,  - kidneys - dialysis,  - stomach - gastric paralysis,  - intestines - malabsorption,  - nerves - severe painful neuritis,  - circulation - gangrene & loss of a leg(s)  - and finally  . . . . . . . . . . . . . . . . . .    - cancer and Alzheimers.  [] [] [] [] [] [] [] [] [] [] [] [] [] [] [] [] [] [] [] [] [] [] [] [] [] [] [] [] [] [] [] [] [] [] [] [] [] [] [] [] [] ][] [] [] [] [] [] [] [] [] [] [] [] [] [] [] [] [] [] [] [] [] [] [[] [] [] [] []   -  Chol = 177    & LDL = 87  - Both Great !   [] [] [] [] [] [] [] [] [] [] [] [] [] [] [] [] [] [] [] [] [] [] [] [] [] [] [] [] [] [] [] [] [] [] [] [] [] [] [] [] [] ][] [] [] [] [] [] [] [] [] [] [] [] [] [] [] [] [] [] [] [] [] [] [[] [] [] [] []   -  Vitamin D  = 38  - is very very LOW  ! ! !   - Vitamin D  goal is between 70-100.    - Please  INCREASE  your Vitamin D  5,000 units back to 1 capsule  Daily     - It is very important as a natural anti-inflammatory and helping the                          immune system protect against viral infections, like Flu  & the Covid    - Also helps hair, skin, and nails, as well as reducing stroke  and heart attack risk.   - It helps your bones  &  and helps with mood.  - It also decreases numerous cancer risks, so please                                                                                           take it as directed.   - Low Vit D is associated with a 200-300% higher risk for CANCER   and 200-300% higher risk for HEART   ATTACK  &  STROKE.    - It is also associated with higher death rate at younger ages,   autoimmune diseases like Rheumatoid arthritis, Lupus, Multiple Sclerosis.     - Also many other serious conditions, like depression, Alzheimer's Dementia                                                                             muscle aches, fatigue, fibromyalgia   [] [] [] [] [] [] [] [] [] [] [] [] [] [] [] [] [] [] [] [] [] [] [] [] [] [] [] [] [] [] [] [] [] [] [] [] [] [] [] [] [] ][] [] [] [] [] [] [] [] [] [] [] [] [] [] [] [] [] [] [] [] [] [] [[] [] [] [] []   - All Else - CBC - Kidneys  - Liver - Magnesium & Thyroid     - all  Normal / OK  [] [] [] [] [] [] [] [] [] [] [] [] [] [] [] [] [] [] [] [] [] [] [] [] [] [] [] [] [] [] [] [] [] [] [] [] [] [] [] [] [] ][] [] [] [] [] [] [] [] [] [] [] [] [] [] [] [] [] [] [] [] [] [] [[] [] [] [] [] 

## 2023-09-06 ENCOUNTER — Encounter: Payer: Self-pay | Admitting: Internal Medicine

## 2023-09-25 ENCOUNTER — Ambulatory Visit
Admission: RE | Admit: 2023-09-25 | Discharge: 2023-09-25 | Disposition: A | Discharge status: Home / Self Care | Payer: Medicare Other | Source: Ambulatory Visit | Attending: Hematology and Oncology

## 2023-09-25 DIAGNOSIS — Z0000 Encounter for general adult medical examination without abnormal findings: Secondary | ICD-10-CM

## 2023-09-25 DIAGNOSIS — Z1231 Encounter for screening mammogram for malignant neoplasm of breast: Secondary | ICD-10-CM | POA: Diagnosis not present

## 2023-10-19 ENCOUNTER — Telehealth: Payer: Self-pay | Admitting: Hematology and Oncology

## 2023-10-19 NOTE — Telephone Encounter (Signed)
 Rescheduled appointments per patients request via incoming call. Patient is aware of the changes made to her upcoming appointments.

## 2023-10-21 ENCOUNTER — Inpatient Hospital Stay: Payer: Medicare Other

## 2023-10-26 ENCOUNTER — Other Ambulatory Visit: Payer: Self-pay

## 2023-10-26 DIAGNOSIS — Z170 Estrogen receptor positive status [ER+]: Secondary | ICD-10-CM

## 2023-10-27 ENCOUNTER — Inpatient Hospital Stay: Payer: Medicare Other | Attending: Hematology and Oncology

## 2023-10-27 ENCOUNTER — Inpatient Hospital Stay: Payer: Medicare Other

## 2023-10-27 VITALS — BP 151/72 | HR 81 | Temp 98.4°F | Resp 18

## 2023-10-27 DIAGNOSIS — M810 Age-related osteoporosis without current pathological fracture: Secondary | ICD-10-CM | POA: Diagnosis not present

## 2023-10-27 DIAGNOSIS — Z853 Personal history of malignant neoplasm of breast: Secondary | ICD-10-CM | POA: Diagnosis not present

## 2023-10-27 DIAGNOSIS — C50412 Malignant neoplasm of upper-outer quadrant of left female breast: Secondary | ICD-10-CM

## 2023-10-27 DIAGNOSIS — M818 Other osteoporosis without current pathological fracture: Secondary | ICD-10-CM

## 2023-10-27 LAB — CBC WITH DIFFERENTIAL (CANCER CENTER ONLY)
Abs Immature Granulocytes: 0.01 10*3/uL (ref 0.00–0.07)
Basophils Absolute: 0.1 10*3/uL (ref 0.0–0.1)
Basophils Relative: 1 %
Eosinophils Absolute: 0.3 10*3/uL (ref 0.0–0.5)
Eosinophils Relative: 5 %
HCT: 36.1 % (ref 36.0–46.0)
Hemoglobin: 12.3 g/dL (ref 12.0–15.0)
Immature Granulocytes: 0 %
Lymphocytes Relative: 18 %
Lymphs Abs: 1.2 10*3/uL (ref 0.7–4.0)
MCH: 31.2 pg (ref 26.0–34.0)
MCHC: 34.1 g/dL (ref 30.0–36.0)
MCV: 91.6 fL (ref 80.0–100.0)
Monocytes Absolute: 0.5 10*3/uL (ref 0.1–1.0)
Monocytes Relative: 7 %
Neutro Abs: 4.7 10*3/uL (ref 1.7–7.7)
Neutrophils Relative %: 69 %
Platelet Count: 316 10*3/uL (ref 150–400)
RBC: 3.94 MIL/uL (ref 3.87–5.11)
RDW: 13.1 % (ref 11.5–15.5)
WBC Count: 6.9 10*3/uL (ref 4.0–10.5)
nRBC: 0 % (ref 0.0–0.2)

## 2023-10-27 LAB — CMP (CANCER CENTER ONLY)
ALT: 27 U/L (ref 0–44)
AST: 27 U/L (ref 15–41)
Albumin: 4.3 g/dL (ref 3.5–5.0)
Alkaline Phosphatase: 41 U/L (ref 38–126)
Anion gap: 7 (ref 5–15)
BUN: 16 mg/dL (ref 8–23)
CO2: 26 mmol/L (ref 22–32)
Calcium: 9.3 mg/dL (ref 8.9–10.3)
Chloride: 99 mmol/L (ref 98–111)
Creatinine: 0.69 mg/dL (ref 0.44–1.00)
GFR, Estimated: >60 mL/min (ref >60)
Glucose, Bld: 91 mg/dL (ref 70–99)
Potassium: 4 mmol/L (ref 3.5–5.1)
Sodium: 132 mmol/L — ABNORMAL LOW (ref 135–145)
Total Bilirubin: 0.3 mg/dL (ref 0.0–1.2)
Total Protein: 7.3 g/dL (ref 6.5–8.1)

## 2023-10-27 MED ORDER — DENOSUMAB 60 MG/ML ~~LOC~~ SOSY
60.0000 mg | PREFILLED_SYRINGE | Freq: Once | SUBCUTANEOUS | Status: AC
Start: 2023-10-27 — End: 2023-10-27
  Administered 2023-10-27: 60 mg via SUBCUTANEOUS
  Filled 2023-10-27: qty 1

## 2023-10-27 NOTE — Patient Instructions (Signed)
 Denosumab Injection (Osteoporosis) What is this medication? DENOSUMAB (den oh SUE mab) prevents and treats osteoporosis. It works by Interior and spatial designer stronger and less likely to break (fracture). It is a monoclonal antibody. This medicine may be used for other purposes; ask your health care provider or pharmacist if you have questions. COMMON BRAND NAME(S): Prolia What should I tell my care team before I take this medication? They need to know if you have any of these conditions: Dental or gum disease Had thyroid or parathyroid (glands located in neck) surgery Having dental surgery or a tooth pulled Kidney disease Low levels of calcium in the blood On dialysis Poor nutrition Thyroid disease Trouble absorbing nutrients from your food An unusual or allergic reaction to denosumab, other medications, foods, dyes, or preservatives Pregnant or trying to get pregnant Breastfeeding How should I use this medication? This medication is injected under the skin. It is given by your care team in a hospital or clinic setting. A special MedGuide will be given to you before each treatment. Be sure to read this information carefully each time. Talk to your care team about the use of this medication in children. Special care may be needed. Overdosage: If you think you have taken too much of this medicine contact a poison control center or emergency room at once. NOTE: This medicine is only for you. Do not share this medicine with others. What if I miss a dose? Keep appointments for follow-up doses. It is important not to miss your dose. Call your care team if you are unable to keep an appointment. What may interact with this medication? Do not take this medication with any of the following: Other medications that contain denosumab This medication may also interact with the following: Medications that lower your chance of fighting infection Steroid medications, such as prednisone or cortisone This  list may not describe all possible interactions. Give your health care provider a list of all the medicines, herbs, non-prescription drugs, or dietary supplements you use. Also tell them if you smoke, drink alcohol, or use illegal drugs. Some items may interact with your medicine. What should I watch for while using this medication? Your condition will be monitored carefully while you are receiving this medication. You may need blood work done while taking this medication. This medication may increase your risk of getting an infection. Call your care team for advice if you get a fever, chills, sore throat, or other symptoms of a cold or flu. Do not treat yourself. Try to avoid being around people who are sick. Tell your dentist and dental surgeon that you are taking this medication. You should not have major dental surgery while on this medication. See your dentist to have a dental exam and fix any dental problems before starting this medication. Take good care of your teeth while on this medication. Make sure you see your dentist for regular follow-up appointments. This medication may cause low levels of calcium in your body. The risk of severe side effects is increased in people with kidney disease. Your care team may prescribe calcium and vitamin D to help prevent low calcium levels while you take this medication. It is important to take calcium and vitamin D as directed by your care team. Talk to your care team if you may be pregnant. Serious birth defects may occur if you take this medication during pregnancy and for 5 months after the last dose. You will need a negative pregnancy test before starting this medication. Contraception  is recommended while taking this medication and for 5 months after the last dose. Your care team can help you find the option that works for you. Talk to your care team before breastfeeding. Changes to your treatment plan may be needed. What side effects may I notice from  receiving this medication? Side effects that you should report to your care team as soon as possible: Allergic reactions--skin rash, itching, hives, swelling of the face, lips, tongue, or throat Infection--fever, chills, cough, sore throat, wounds that don't heal, pain or trouble when passing urine, general feeling of discomfort or being unwell Low calcium level--muscle pain or cramps, confusion, tingling, or numbness in the hands or feet Osteonecrosis of the jaw--pain, swelling, or redness in the mouth, numbness of the jaw, poor healing after dental work, unusual discharge from the mouth, visible bones in the mouth Severe bone, joint, or muscle pain Skin infection--skin redness, swelling, warmth, or pain Side effects that usually do not require medical attention (report these to your care team if they continue or are bothersome): Back pain Headache Joint pain Muscle pain Pain in the hands, arms, legs, or feet Runny or stuffy nose Sore throat This list may not describe all possible side effects. Call your doctor for medical advice about side effects. You may report side effects to FDA at 1-800-FDA-1088. Where should I keep my medication? This medication is given in a hospital or clinic. It will not be stored at home. NOTE: This sheet is a summary. It may not cover all possible information. If you have questions about this medicine, talk to your doctor, pharmacist, or health care provider.  2024 [employer] (2022-09-23 00:00:00)

## 2023-11-12 ENCOUNTER — Encounter: Payer: Medicare Other | Admitting: Nurse Practitioner

## 2023-11-26 DIAGNOSIS — H2513 Age-related nuclear cataract, bilateral: Secondary | ICD-10-CM | POA: Diagnosis not present

## 2023-11-26 DIAGNOSIS — H524 Presbyopia: Secondary | ICD-10-CM | POA: Diagnosis not present

## 2023-11-26 DIAGNOSIS — D3132 Benign neoplasm of left choroid: Secondary | ICD-10-CM | POA: Diagnosis not present

## 2023-12-01 ENCOUNTER — Encounter: Payer: Medicare Other | Admitting: Nurse Practitioner

## 2023-12-07 ENCOUNTER — Ambulatory Visit: Payer: Medicare Other | Admitting: Family Medicine

## 2023-12-07 ENCOUNTER — Encounter: Payer: Self-pay | Admitting: Family Medicine

## 2023-12-07 VITALS — BP 147/77 | HR 76 | Temp 98.0°F | Ht 60.0 in | Wt 163.4 lb

## 2023-12-07 DIAGNOSIS — I10 Essential (primary) hypertension: Secondary | ICD-10-CM

## 2023-12-07 DIAGNOSIS — Z1501 Genetic susceptibility to malignant neoplasm of breast: Secondary | ICD-10-CM | POA: Diagnosis not present

## 2023-12-07 DIAGNOSIS — H93A1 Pulsatile tinnitus, right ear: Secondary | ICD-10-CM

## 2023-12-07 DIAGNOSIS — R7303 Prediabetes: Secondary | ICD-10-CM | POA: Insufficient documentation

## 2023-12-07 DIAGNOSIS — M818 Other osteoporosis without current pathological fracture: Secondary | ICD-10-CM

## 2023-12-07 DIAGNOSIS — C50412 Malignant neoplasm of upper-outer quadrant of left female breast: Secondary | ICD-10-CM | POA: Diagnosis not present

## 2023-12-07 DIAGNOSIS — I671 Cerebral aneurysm, nonruptured: Secondary | ICD-10-CM

## 2023-12-07 DIAGNOSIS — E782 Mixed hyperlipidemia: Secondary | ICD-10-CM

## 2023-12-07 DIAGNOSIS — G720 Drug-induced myopathy: Secondary | ICD-10-CM

## 2023-12-07 DIAGNOSIS — Z170 Estrogen receptor positive status [ER+]: Secondary | ICD-10-CM

## 2023-12-07 DIAGNOSIS — Z1211 Encounter for screening for malignant neoplasm of colon: Secondary | ICD-10-CM | POA: Insufficient documentation

## 2023-12-07 MED ORDER — AZITHROMYCIN 250 MG PO TABS: ORAL_TABLET | ORAL | 0 refills | Status: DC

## 2023-12-07 MED ORDER — OLMESARTAN MEDOXOMIL-HCTZ 40-25 MG PO TABS: 1.0000 | ORAL_TABLET | Freq: Every day | ORAL | 3 refills | Status: DC

## 2023-12-07 NOTE — Progress Notes (Signed)
 Subjective  CC:  Chief Complaint  Patient presents with   Establish Care   Sinus Problem   Hypertension   Hyperlipidemia    HPI: Jacqueline Jenkins is a 73 y.o. female who presents to [facility] [place] today to establish care with me as a new patient.  73 year old married white female, Casimiro Needle her husband is my patient.  I assisted her daughter.  Her primary care doctor recently passed away.  I reviewed notes.  She has multiple specialist.  I reviewed notes from oncology, primary care, neurosurgery, gynecology and gastroenterology.  I reviewed and updated her problem list.  We reviewed her past medical history. She has the following concerns or needs:  2-week history of sinus infection symptoms.  Started with nasal congestion postnasal drainage.  Now with malaise, fatigue, left-sided frontal and maxillary sinus tenderness, thick drainage, no fevers.  Cough and some hoarseness.  She has no shortness of breath.  She has had sinus infections in the past and this feels similar.  No history of asthma.  Non-smoker History of breast cancer, BRCA2 positive with routine screening up-to-date.  She remains on Arimidex.  Her cancer was diagnosed in 2016.  She is still followed by oncology.  She has secondary osteoporosis and now is on Prolia after being on Fosamax with worsening bone densities.  She is tolerating this well.  No fractures. Hyperlipidemia, statin intolerant due to statin myopathy and elevated LFTs.  Now on Repatha and lipids are at goal.  She is tolerating this. History of pulsatile tinnitus due to a dural AV fistula which was repaired by neurosurgery.  She has some hearing loss. Chronic hypertension which has been treated well with Benicar HCT for a number of years.  However blood pressures even at home are running 130s over 70s to 80s.  No chest pain or secondary complications.  She does have atherosclerosis seen on CT.  No coronary artery disease.  No anginal symptoms.   She has been on other blood pressure medications and says that at times that cause hands and leg swelling.  No palpitations  Assessment  1. Essential hypertension   2. Other osteoporosis without current pathological fracture   3. Prediabetes   4. BRCA2 positive   5. Malignant neoplasm of upper-outer quadrant of left breast in female, estrogen receptor positive (HCC)   6. Hyperlipidemia, mixed   7. Statin myopathy   8. Screening for colorectal cancer   9. Dural arteriovenous fistula   10. Pulsatile tinnitus of right ear      Plan  Hypertension: Borderline control.  May have some whitecoat component.  Adjust Benicar HCT to 40/25 daily.  Recheck 3 months.  Will recheck renal function at that time. Prediabetes: She is monitoring this.  Most recent A1c was 6.0.  Monitoring diet Continue Repatha for hyperlipidemia BRCA2 positive gene mutation with history of breast cancer: Stable on Arimidex and cancer screenings.  Their current Acute sinusitis: Z-Pak  Follow up: 3 months to recheck prediabetes and hypertension No orders of the defined types were placed in this encounter.  Meds ordered this encounter  Medications   olmesartan-hydrochlorothiazide (BENICAR HCT) 40-25 MG tablet    Sig: Take 1 tablet by mouth daily.    Dispense:  90 tablet    Refill:  3   azithromycin (ZITHROMAX) 250 MG tablet    Sig: Take 2 tabs today, then 1 tab daily for 4 days    Dispense:  1 each  Refill:  0        09/06/2023    7:43 PM 05/27/2022    8:13 PM 02/12/2022    2:46 PM 11/05/2021    2:04 PM 06/11/2021   12:47 AM  Depression screen PHQ 2/9  Decreased Interest 0 0 0 0 0  Down, Depressed, Hopeless 0 0 0 0 0  PHQ - 2 Score 0 0 0 0 0    We updated and reviewed the patient's past history in detail and it is documented below.  Patient Active Problem List   Diagnosis Date Noted Date Diagnosed   Prediabetes 12/07/2023     Priority: High   Statin myopathy 08/01/2020     Priority: High   Aortic  atherosclerosis (HCC) by Abd CT scan in 2021 07/31/2020     Priority: High    Per CT 10/2019    BRCA2 positive 09/19/2015     Priority: High    Pathogenic mutation "[id number] (p.[id number])" found in one copy of the BRCA2 gene.  Found via Breast/Ovarian Cancer Panel through [facility] in [place], [place].  Date of report is September 12, 2015.      Breast cancer of upper-outer quadrant of left female breast (HCC) 08/21/2015     Priority: High   Essential hypertension 06/07/2015     Priority: High   Hyperlipidemia, mixed      Priority: High   Aneurysm of splenic artery (HCC) 01/19/2012     Priority: High   FHx: heart disease 12/28/2017     Priority: Medium    Osteoporosis 06/17/2017     Priority: Medium     Follows with Dr. Pamelia Hoit Started fosamax x 09/2019 after last DEXA -2.5 DEXA 2024 lowest T = -3.0 forearm. Prolia, managed by oncology    Dural arteriovenous fistula 03/05/2017     Priority: Medium     Surgical repair by neurosurgery, Dr. Nundkumar 2018    Lumbar degenerative disc disease      Priority: Medium    Screening for colorectal cancer 12/07/2023     Priority: Low    Dr. Jeani Hawking: colonoscopy 2013; negative cologuard 2023    Sensorineural hearing loss (SNHL) of both ears 11/28/2016     Priority: Low   Genetic testing 09/19/2015     Priority: Low    Positive for pathogenic mutation, "[id number] (p.[id number])", in one copy of the BRCA2 gene.  One variant of uncertain significance (VUS) called "c.320-5T>A (IVS2-5T>A)" found in one copy of the CHEK2 gene.  Test performed was the 20-gene Breast/Ovarian Cancer Panel offered by [facility].  The Breast/Ovarian Cancer Panel offered by [facility] Basilio Cairo, [place]) includes sequencing and deletion/duplication analysis for the following 19 genes:  ATM, BARD1, BRCA1, BRCA2, BRIP1, CDH1, CHEK2, FANCC, MLH1, MSH2, MSH6, NBN, PALB2, PMS2, PTEN, RAD51C, RAD51D, TP53, and XRCC2.   This panel also includes deletion/duplication analysis (without sequencing) for one gene, EPCAM.  Date of report is September 12, 2015.       Allergic rhinitis      Priority: Low   Vitamin D deficiency      Priority: Low   Health Maintenance  Topic Date Due   Zoster Vaccines- Shingrix (1 of 2) Never done   COVID-19 Vaccine (7 - 2024-25 season) 12/23/2023 (Originally 05/03/2023)   Medicare Annual Wellness (AWV)  02/19/2024   INFLUENZA VACCINE  04/01/2024   DEXA SCAN  09/23/2024   MAMMOGRAM  09/24/2024   Fecal DNA (Cologuard)  11/14/2024   DTaP/Tdap/Td (5 - Tdap) 01/21/2025  Hepatitis C Screening  Completed   HPV VACCINES  Aged Out   Pneumonia Vaccine 77+ Years old  Discontinued   Colonoscopy  Discontinued   Immunization History  Administered Date(s) Administered   DT (Pediatric) 01/22/2015   Influenza Split 06/08/2014, 06/02/2016   Influenza, High Dose Seasonal PF 06/08/2014, 05/27/2017, 06/01/2018, 06/02/2019, 06/01/2020, 05/31/2022   Influenza-Unspecified 06/14/2013, 06/04/2015, 05/27/2017   Meningococcal Conjugate 09/02/2011   Moderna Sars-Covid-2 Vaccination 10/17/2019, 11/15/2019   PFIZER(Purple Top)SARS-COV-2 Vaccination 06/27/2020, 01/22/2021   PPD Test 01/18/2014   Pfizer Covid-19 Vaccine Bivalent Booster 32yrs & up 06/07/2021   Pfizer(Comirnaty)Fall Seasonal Vaccine 12 years and older 07/04/2022   Pneumococcal Conjugate-13 12/31/2011, 06/17/2017   Pneumococcal Polysaccharide-23 09/02/2011   Pneumococcal-Unspecified 09/22/1995, 09/02/2011   Td 11/27/1994, 04/29/2004, 01/22/2015   Current Meds  Medication Sig   anastrozole (ARIMIDEX) 1 MG tablet TAKE 1 TABLET BY MOUTH EVERY DAY   azithromycin (ZITHROMAX) 250 MG tablet Take 2 tabs today, then 1 tab daily for 4 days   Calcium Carb-Cholecalciferol (CALCIUM 1000 + D PO) Take 2 tablets by mouth daily. Gummies   Evolocumab (REPATHA SURECLICK) 140 MG/ML SOAJ INJECT 1 DOSE INTO SKIN EVERY 14 DAYS   Magnesium 250 MG TABS Take  by mouth daily.   Multiple Vitamin (MULTIVITAMIN) tablet Take 1 tablet by mouth daily.   naproxen sodium (ANAPROX) 220 MG tablet Take 220 mg by mouth daily as needed (pain).   olmesartan-hydrochlorothiazide (BENICAR HCT) 40-25 MG tablet Take 1 tablet by mouth daily.   VITAMIN D PO Take 5,000 Units by mouth. Takes 1 capsule every other day.   [DISCONTINUED] olmesartan-hydrochlorothiazide (BENICAR HCT) 40-12.5 MG tablet TAKE 1 TAB DAILY IN THE MORNING FOR BLOOD PRESSURE.    Allergies: Patient is allergic to lescol [fluvastatin sodium], lipitor [atorvastatin], and vytorin [ezetimibe-simvastatin]. Past Medical History Patient  has a past medical history of Allergic rhinitis, cause unspecified, Anemia, BRCA2 positive, Breast cancer (HCC) (2016), Breast cancer of upper-outer quadrant of left female breast (HCC), DDD (degenerative disc disease), Diverticulitis, Family history of breast cancer (08/30/2015), Family history of colon cancer (08/30/2015), Hyperlipidemia, Hypertension, Left kidney mass (04/06/2018), Neuropathy, Osteopenia (12/2011), Personal history of chemotherapy (2016), Personal history of radiation therapy (2016), PONV (postoperative nausea and vomiting), Pulsatile tinnitus of right ear (10/16/2016), Splenic artery aneurysm (HCC) (2013), and Vitamin D deficiency. Past Surgical History Patient  has a past surgical history that includes Tonsillectomy (1968); Spine surgery (1998); Spine surgery (2001, 2010); Cervical disc surgery (1998); Lumbar disc surgery (2001, 2010); splenic aneurysm (02/10/2012); Colonoscopy (Jan. 7, 2014); visceral angiogram (N/A, 02/10/2012); Breast lumpectomy with radioactive seed and sentinel lymph node biopsy (Left, 08/29/2015); Re-excision of breast lumpectomy (Left, 09/07/2015); Portacath placement (Bilateral, 10/09/2015); Portacath removal; Laparoscopic bilateral salpingo oophorectomy (Bilateral, 05/16/2016); IR ANGIO INTRA EXTRACRAN SEL INTERNAL CAROTID BILAT MOD SED  (02/03/2017); IR NEURO EACH ADD'L AFTER BASIC UNI RIGHT (MS) (02/03/2017); IR ANGIO VERTEBRAL SEL VERTEBRAL BILAT MOD SED (02/03/2017); IR ANGIO EXTERNAL CAROTID SEL EXT CAROTID BILAT MOD SED (02/03/2017); Radiology with anesthesia (N/A, 03/05/2017); IR NEURO EACH ADD'L AFTER BASIC UNI RIGHT (MS) (03/05/2017); IR Angiogram Follow Up Study (03/05/2017); IR Transcath/Emboliz (03/05/2017); IR ANGIO INTRA EXTRACRAN SEL COM CAROTID INNOMINATE UNI R MOD SED (03/05/2017); IR ANGIO EXTERNAL CAROTID SEL EXT CAROTID UNI R MOD SED (03/05/2017); Breast lumpectomy (Left, 2016); and dural AV fistula (03/2018). Family History: Patient family history includes Breast cancer in her cousin and maternal grandmother; Colon cancer in her maternal uncle; Deep vein thrombosis in her mother; Diverticulitis in her mother; Heart Problems in  her paternal grandfather; Heart attack in her father, paternal aunt, paternal grandmother, and paternal uncle; Heart defect in her maternal aunt; Hyperlipidemia in her mother; Hypertension in her father and mother; Leukemia in her maternal aunt; Lung cancer in her maternal uncle; Osteoporosis in her mother; Other in her father, mother, and sister; Pancreatic cancer in her maternal uncle; Stroke in her father, maternal grandfather, mother, paternal aunt, paternal grandmother, and paternal uncle; Vasculitis in her sister. Social History:  Patient  reports that she has never smoked. She has never used smokeless tobacco. She reports that she does not currently use alcohol. She reports that she does not use drugs.  Review of Systems: Constitutional: negative for fever or malaise Ophthalmic: negative for photophobia, double vision or loss of vision Cardiovascular: negative for chest pain, dyspnea on exertion, or new LE swelling Respiratory: negative for SOB or persistent cough Gastrointestinal: negative for abdominal pain, change in bowel habits or melena Genitourinary: negative for dysuria or gross  hematuria Musculoskeletal: negative for new gait disturbance or muscular weakness Integumentary: negative for new or persistent rashes Neurological: negative for TIA or stroke symptoms Psychiatric: negative for SI or delusions Allergic/Immunologic: negative for hives  Patient Care Team    Relationship Specialty Notifications Start End  Willow Ora, MD PCP - General Family Medicine  12/07/23   Nada Libman, MD Consulting Physician Vascular Surgery  01/19/14   Jeani Hawking, MD Consulting Physician Gastroenterology  01/19/14   [place], Amy, MD Consulting Physician Dermatology  01/19/14   Serena Croissant, MD Consulting Physician Hematology and Oncology Admissions 08/20/15   Mckinley Jewel, MD Consulting Physician Ophthalmology  10/19/18   Huel Cote, MD Consulting Physician Obstetrics and Gynecology  10/19/18     Objective  Vitals: BP (!) 147/77   Pulse 76   Temp 98 F (36.7 C)   Ht 5' (1.524 m)   Wt 163 lb 6.4 oz (74.1 kg)   SpO2 99%   BMI 31.91 kg/m  General:  Well developed, well nourished, no acute distress  Psych:  Alert and oriented,normal mood and affect HEENT:  Normocephalic, atraumatic, non-icteric sclera, supple neck without adenopathy, mass or thyromegaly, she is hoarse, TMs are clear, left-sided sinus tenderness is present. Cardiovascular:  RRR without gallop, rub or murmur Respiratory:  Good breath sounds bilaterally, CTAB with normal respiratory effort Gastrointestinal: normal bowel sounds, soft, non-tender, no noted masses. No HSM MSK: no deformities, contusions. Joints are without erythema or swelling Skin:  Warm, no rashes or suspicious lesions noted Neurologic:    Mental status is normal. Gross motor and sensory exams are normal. Normal gait  No visits with results within 1 Day(s) from this visit.  Latest known visit with results is:  Appointment on 10/27/2023  Component Date Value Ref Range Status   Sodium 10/27/2023 132 (L)  135 - 145 mmol/L Final    Potassium 10/27/2023 4.0  3.5 - 5.1 mmol/L Final   Chloride 10/27/2023 99  98 - 111 mmol/L Final   CO2 10/27/2023 26  22 - 32 mmol/L Final   Glucose, Bld 10/27/2023 91  70 - 99 mg/dL Final   BUN 40/98/1191 16  8 - 23 mg/dL Final   Creatinine 47/82/9562 0.69  0.44 - 1.00 mg/dL Final   Calcium 13/04/6577 9.3  8.9 - 10.3 mg/dL Final   Total Protein 46/96/2952 7.3  6.5 - 8.1 g/dL Final   Albumin 84/13/2440 4.3  3.5 - 5.0 g/dL Final   AST 06/28/2535 27  15 - 41 U/L Final  ALT 10/27/2023 27  0 - 44 U/L Final   Alkaline Phosphatase 10/27/2023 41  38 - 126 U/L Final   Total Bilirubin 10/27/2023 0.3  0.0 - 1.2 mg/dL Final   GFR, Estimated 10/27/2023 >60  >60 mL/min Final   Anion gap 10/27/2023 7  5 - 15 Final   WBC Count 10/27/2023 6.9  4.0 - 10.5 K/uL Final   RBC 10/27/2023 3.94  3.87 - 5.11 MIL/uL Final   Hemoglobin 10/27/2023 12.3  12.0 - 15.0 g/dL Final   HCT 16/06/9603 36.1  36.0 - 46.0 % Final   MCV 10/27/2023 91.6  80.0 - 100.0 fL Final   MCH 10/27/2023 31.2  26.0 - 34.0 pg Final   MCHC 10/27/2023 34.1  30.0 - 36.0 g/dL Final   RDW 54/05/8118 13.1  11.5 - 15.5 % Final   Platelet Count 10/27/2023 316  150 - 400 K/uL Final   nRBC 10/27/2023 0.0  0.0 - 0.2 % Final   Neutrophils Relative % 10/27/2023 69  % Final   Neutro Abs 10/27/2023 4.7  1.7 - 7.7 K/uL Final   Lymphocytes Relative 10/27/2023 18  % Final   Lymphs Abs 10/27/2023 1.2  0.7 - 4.0 K/uL Final   Monocytes Relative 10/27/2023 7  % Final   Monocytes Absolute 10/27/2023 0.5  0.1 - 1.0 K/uL Final   Eosinophils Relative 10/27/2023 5  % Final   Eosinophils Absolute 10/27/2023 0.3  0.0 - 0.5 K/uL Final   Basophils Relative 10/27/2023 1  % Final   Basophils Absolute 10/27/2023 0.1  0.0 - 0.1 K/uL Final   Immature Granulocytes 10/27/2023 0  % Final   Abs Immature Granulocytes 10/27/2023 0.01  0.00 - 0.07 K/uL Final    Commons side effects, risks, benefits, and alternatives for medications and treatment plan prescribed today were  discussed, and the patient expressed understanding of the given instructions. Patient is instructed to call or message via MyChart if he/she has any questions or concerns regarding our treatment plan. No barriers to understanding were identified. We discussed Red Flag symptoms and signs in detail. Patient expressed understanding regarding what to do in case of urgent or emergency type symptoms.  Medication list was reconciled, printed and provided to the patient in AVS. Patient instructions and summary information was reviewed with the patient as documented in the AVS. This note was prepared with assistance of Dragon voice recognition software. Occasional wrong-word or sound-a-like substitutions may have occurred due to the inherent limitations of voice recognition software  This visit occurred during the SARS-CoV-2 public health emergency.  Safety protocols were in place, including screening questions prior to the visit, additional usage of staff PPE, and extensive cleaning of exam room while observing appropriate contact time as indicated for disinfecting solutions.

## 2023-12-07 NOTE — Patient Instructions (Signed)
 Please return in 3 months to recheck sugars and blood pressure  If you have any questions or concerns, please don't hesitate to send me a message via MyChart or call the office at [phone]. Thank you for visiting with [place] today! It's our pleasure caring for you.

## 2023-12-11 ENCOUNTER — Other Ambulatory Visit: Payer: Self-pay | Admitting: Internal Medicine

## 2023-12-11 DIAGNOSIS — I700 Atherosclerosis of aorta: Secondary | ICD-10-CM

## 2023-12-11 DIAGNOSIS — E782 Mixed hyperlipidemia: Secondary | ICD-10-CM

## 2024-01-04 ENCOUNTER — Other Ambulatory Visit: Payer: Self-pay | Admitting: Family Medicine

## 2024-02-19 ENCOUNTER — Ambulatory Visit: Payer: Medicare Other | Admitting: Nurse Practitioner

## 2024-03-03 ENCOUNTER — Ambulatory Visit: Payer: Medicare Other | Admitting: Nurse Practitioner

## 2024-03-07 ENCOUNTER — Encounter: Payer: Self-pay | Admitting: Family Medicine

## 2024-03-07 ENCOUNTER — Ambulatory Visit (INDEPENDENT_AMBULATORY_CARE_PROVIDER_SITE_OTHER): Admitting: Family Medicine

## 2024-03-07 VITALS — BP 130/78 | HR 72 | Temp 98.1°F | Ht 60.0 in | Wt 166.0 lb

## 2024-03-07 DIAGNOSIS — R7303 Prediabetes: Secondary | ICD-10-CM

## 2024-03-07 DIAGNOSIS — I10 Essential (primary) hypertension: Secondary | ICD-10-CM

## 2024-03-07 LAB — POCT GLYCOSYLATED HEMOGLOBIN (HGB A1C)
HbA1c POC (<> result, manual entry): 5.5 % (ref 4.0–5.6)
HbA1c, POC (controlled diabetic range): 5.5 % (ref 0.0–7.0)
HbA1c, POC (prediabetic range): 5.5 % — AB (ref 5.7–6.4)
Hemoglobin A1C: 5.5 % (ref 4.0–5.6)

## 2024-03-07 NOTE — Progress Notes (Signed)
 Subjective  CC:  Chief Complaint  Patient presents with   Hypertension    HPI: Jacqueline Jenkins is a 73 y.o. female who presents to the office today to address the problems listed above in the chief complaint. Hypertension f/u:  Discussed the use of AI scribe software for clinical note transcription with the patient, who gave verbal consent to proceed.  History of Present Illness Jacqueline Jenkins is a 73 year old female with hypertension and prediabetes who presents for follow-up of her chronic conditions.  Prediabetes: She is attempting to improve her diet by reducing carbohydrate intake, although her weight remains unchanged.  Feels well without symptoms of hyperglycemia.    Hypertension: We adjusted of her medication dose at her last visit in April.  She is tolerating the change well.  She is currently monitoring her blood pressure.  Home blood pressure readings average 120s over 70s consistently.  She feels well.  No  chest pain, shortness of breath lower extremity edema or palpitations.  She complains of feeling pretty fatigued most days of the week.  Which she associates with her Prolia  injections.  However, her last injection was in January and she still has daily fatigue.  She admits to poor sleep.  Mainly stress related.  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  He has full care needs 24/7.  Fortunately she does have some relief with the morning helpers. She describes her sleep as insufficient, often waking up around 4 AM and struggling to return to sleep until 7 or 8 AM. She attributes her sleep difficulties to a racing mind and caregiving responsibilities for her husband, who was diagnosed with a condition 15 years ago. She declines any sleep aids due to their side effects.  She is interested in joining a caregiver's group for support, as she has been managing her husband's condition since 2016. Her daughters help when they can, and she has some assistance from  caregivers, although one is currently unavailable due to surgery.   Assessment  1. Essential hypertension   2. Prediabetes      Plan  Assessment and Plan Assessment & Plan Fatigue Chronic fatigue possibly linked to Prolia  or poor sleep quality. Prefers non-pharmacological sleep interventions.  Osteoporosis Continued Prolia  advised due to concurrent anastrozole  treatment for breast cancer. - Continue Prolia  injections as scheduled.  Caregiver Stress Stress from caregiving responsibilities. Interested in support group participation. - Explore local caregiver support groups through hospice or palliative care services.  Hypertension Hypertension well-controlled with current medication. - Continue current antihypertensive regimen.  Benicar  HCT, 40/25 daily  Prediabetes Improved glycemic control with recent blood sugar reading of 5.5.     Education regarding management of these chronic disease states was given. Management strategies discussed on successive visits include dietary and exercise recommendations, goals of achieving and maintaining IBW, and lifestyle modifications aiming for adequate sleep and minimizing stressors.  Follow up: 6 months for complete physical and follow-up chronic problems  Orders Placed This Encounter  Procedures   POCT glycosylated hemoglobin (Hb A1C)   No orders of the defined types were placed in this encounter.     BP Readings from Last 3 Encounters:  03/07/24 130/78  12/07/23 (!) 147/77  10/27/23 (!) 151/72   Wt Readings from Last 3 Encounters:  03/07/24 166 lb (75.3 kg)  12/07/23 163 lb 6.4 oz (74.1 kg)  09/01/23 162 lb 9.6 oz (73.8 kg)    Lab Results  Component Value Date  CHOL 177 09/01/2023   CHOL 212 (H) 05/25/2023   CHOL 177 02/19/2023   Lab Results  Component Value Date   HDL 69 09/01/2023   HDL 73 05/25/2023   HDL 71 02/19/2023   Lab Results  Component Value Date   LDLCALC 87 09/01/2023   LDLCALC 121 (H)  05/25/2023   LDLCALC 81 02/19/2023   Lab Results  Component Value Date   TRIG 116 09/01/2023   TRIG 85 05/25/2023   TRIG 148 02/19/2023   Lab Results  Component Value Date   CHOLHDL 2.6 09/01/2023   CHOLHDL 2.9 05/25/2023   CHOLHDL 2.5 02/19/2023   No results found for: LDLDIRECT Lab Results  Component Value Date   CREATININE 0.69 10/27/2023   BUN 16 10/27/2023   NA 132 (L) 10/27/2023   K 4.0 10/27/2023   CL 99 10/27/2023   CO2 26 10/27/2023    The 10-year ASCVD risk score (Arnett DK, et al., 2019) is: 19.2%   Values used to calculate the score:     Age: 56 years     Clincally relevant sex: Female     Is Non-Hispanic African American: No     Diabetic: No     Tobacco smoker: No     Systolic Blood Pressure: 130 mmHg     Is BP treated: Yes     HDL Cholesterol: 69 mg/dL     Total Cholesterol: 177 mg/dL  I reviewed the patients updated PMH, FH, and SocHx.    Patient Active Problem List   Diagnosis Date Noted   Prediabetes 12/07/2023    Priority: High   Statin myopathy 08/01/2020    Priority: High   Aortic atherosclerosis (HCC) by Abd CT scan in 2021 07/31/2020    Priority: High   BRCA2 positive 09/19/2015    Priority: High   Breast cancer of upper-outer quadrant of left female breast (HCC) 08/21/2015    Priority: High   Essential hypertension 06/07/2015    Priority: High   Hyperlipidemia, mixed     Priority: High   Aneurysm of splenic artery (HCC) 01/19/2012    Priority: High   FHx: heart disease 12/28/2017    Priority: Medium    Osteoporosis 06/17/2017    Priority: Medium    Dural arteriovenous fistula 03/05/2017    Priority: Medium    Lumbar degenerative disc disease     Priority: Medium    Screening for colorectal cancer 12/07/2023    Priority: Low   Sensorineural hearing loss (SNHL) of both ears 11/28/2016    Priority: Low   Genetic testing 09/19/2015    Priority: Low   Allergic rhinitis     Priority: Low   Vitamin D  deficiency     Priority:  Low    Allergies: Lescol [fluvastatin sodium], Lipitor [atorvastatin], and Vytorin [ezetimibe -simvastatin]  Social History: Patient  reports that she has never smoked. She has never used smokeless tobacco. She reports that she does not currently use alcohol. She reports that she does not use drugs.  Current Meds  Medication Sig   anastrozole  (ARIMIDEX ) 1 MG tablet TAKE 1 TABLET BY MOUTH EVERY DAY   Calcium  Carb-Cholecalciferol  (CALCIUM  1000 + D PO) Take 2 tablets by mouth daily. Gummies   Evolocumab  (REPATHA  SURECLICK) 140 MG/ML SOAJ INJECT 1 DOSE INTO SKIN EVERY 14 DAYS   Magnesium 250 MG TABS Take by mouth daily.   Multiple Vitamin (MULTIVITAMIN) tablet Take 1 tablet by mouth daily.   naproxen  sodium (ANAPROX ) 220 MG tablet Take 220  mg by mouth daily as needed (pain).   olmesartan -hydrochlorothiazide  (BENICAR  HCT) 40-25 MG tablet TAKE 1 TABLET BY MOUTH EVERY DAY   VITAMIN D  PO Take 5,000 Units by mouth. Takes 1 capsule every other day.    Review of Systems: Cardiovascular: negative for chest pain, palpitations, leg swelling, orthopnea Respiratory: negative for SOB, wheezing or persistent cough Gastrointestinal: negative for abdominal pain Genitourinary: negative for dysuria or gross hematuria  Objective  Vitals: BP 130/78   Pulse 72   Temp 98.1 F (36.7 C)   Ht 5' (1.524 m)   Wt 166 lb (75.3 kg)   SpO2 98%   BMI 32.42 kg/m  General: no acute distress  Psych:  Alert and oriented, normal mood and affect HEENT:  Normocephalic, atraumatic, supple neck  Cardiovascular:  RRR without murmur. no edema Respiratory:  Good breath sounds bilaterally, CTAB with normal respiratory effort Skin:  Warm, no rashes Neurologic:   Mental status is normal Commons side effects, risks, benefits, and alternatives for medications and treatment plan prescribed today were discussed, and the patient expressed understanding of the given instructions. Patient is instructed to call or message via  MyChart if he/she has any questions or concerns regarding our treatment plan. No barriers to understanding were identified. We discussed Red Flag symptoms and signs in detail. Patient expressed understanding regarding what to do in case of urgent or emergency type symptoms.  Medication list was reconciled, printed and provided to the patient in AVS. Patient instructions and summary information was reviewed with the patient as documented in the AVS. This note was prepared with assistance of Dragon voice recognition software. Occasional wrong-word or sound-a-like substitutions may have occurred due to the inherent limitation

## 2024-03-07 NOTE — Patient Instructions (Addendum)
 Please return in December for your annual complete physical; please come fasting. For follow up on chronic medical conditions   If you have any questions or concerns, please don't hesitate to send me a message via MyChart or call the office at [phone]. Thank you for visiting with us  today! It's our pleasure caring for you.    VISIT SUMMARY: Today, we reviewed your chronic conditions, including hypertension, diabetes, and osteoporosis. We also discussed your fatigue, sleep issues, and caregiver stress.  YOUR PLAN: -FATIGUE: Your chronic fatigue may be linked to your Prolia  injections or poor sleep quality. We will focus on non-medication approaches to improve your sleep.  -OSTEOPOROSIS: Osteoporosis is a condition where bones become weak and brittle. You should continue with your Prolia  injections as scheduled due to your ongoing treatment with anastrozole  for breast cancer.  -CAREGIVER STRESS: Caregiver stress can occur when the demands of caregiving become overwhelming. You are encouraged to explore local caregiver support groups through hospice or palliative care services for additional support.  -HYPERTENSION: Hypertension, or high blood pressure, is well-controlled with your current medication. Please continue with your current antihypertensive regimen.  -Prediabetes: Your blood sugar levels have improved, with a recent reading of 5.5. Keep up the good work with your diet and monitoring.  INSTRUCTIONS: Please continue monitoring your blood pressure and blood sugar levels. Explore local caregiver support groups for additional support. Follow up with your Prolia  injections as scheduled.                      Contains text generated by [employer].                                 Contains text generated by [employer]bridge.

## 2024-03-14 ENCOUNTER — Telehealth: Payer: Self-pay | Admitting: Internal Medicine

## 2024-03-15 ENCOUNTER — Telehealth: Payer: Self-pay | Admitting: Hematology and Oncology

## 2024-03-15 NOTE — Telephone Encounter (Signed)
 Rescheule appointment per provider pal.  Called left VM with changes made to the upcoming appointment.

## 2024-03-25 ENCOUNTER — Ambulatory Visit
Admission: RE | Admit: 2024-03-25 | Discharge: 2024-03-25 | Disposition: A | Discharge status: Home / Self Care | Payer: Medicare Other | Source: Ambulatory Visit | Attending: Hematology and Oncology | Admitting: Hematology and Oncology

## 2024-03-25 DIAGNOSIS — Z170 Estrogen receptor positive status [ER+]: Secondary | ICD-10-CM

## 2024-03-25 DIAGNOSIS — Z853 Personal history of malignant neoplasm of breast: Secondary | ICD-10-CM | POA: Diagnosis not present

## 2024-03-25 DIAGNOSIS — Z1239 Encounter for other screening for malignant neoplasm of breast: Secondary | ICD-10-CM | POA: Diagnosis not present

## 2024-03-25 MED ORDER — GADOPICLENOL 0.5 MMOL/ML IV SOLN
7.0000 mL | Freq: Once | INTRAVENOUS | Status: AC | PRN
  Administered 2024-03-25: 7 mL via INTRAVENOUS

## 2024-03-30 DIAGNOSIS — L821 Other seborrheic keratosis: Secondary | ICD-10-CM | POA: Diagnosis not present

## 2024-03-30 DIAGNOSIS — L820 Inflamed seborrheic keratosis: Secondary | ICD-10-CM | POA: Diagnosis not present

## 2024-03-30 DIAGNOSIS — D2262 Melanocytic nevi of left upper limb, including shoulder: Secondary | ICD-10-CM | POA: Diagnosis not present

## 2024-03-30 DIAGNOSIS — D2271 Melanocytic nevi of right lower limb, including hip: Secondary | ICD-10-CM | POA: Diagnosis not present

## 2024-03-31 ENCOUNTER — Inpatient Hospital Stay: Payer: Medicare Other | Admitting: Hematology and Oncology

## 2024-04-06 ENCOUNTER — Ambulatory Visit

## 2024-04-07 ENCOUNTER — Ambulatory Visit

## 2024-04-07 VITALS — Ht 65.0 in | Wt 166.0 lb

## 2024-04-07 DIAGNOSIS — Z0000 Encounter for general adult medical examination without abnormal findings: Secondary | ICD-10-CM | POA: Diagnosis not present

## 2024-04-07 NOTE — Progress Notes (Signed)
 Subjective:   Jacqueline Jenkins is a 73 y.o. who presents for a Medicare Wellness preventive visit.  As a reminder, Annual Wellness Visits don't include a physical exam, and some assessments may be limited, especially if this visit is performed virtually. We may recommend an in-person follow-up visit with your provider if needed.  Visit Complete: Virtual I connected with  Rock GORMAN Barefoot on 04/07/24 by a audio enabled telemedicine application and verified that I am speaking with the correct person using two identifiers.  Patient Location: Home  Provider Location: Office/Clinic  I discussed the limitations of evaluation and management by telemedicine. The patient expressed understanding and agreed to proceed.  Vital Signs: Because this visit was a virtual/telehealth visit, some criteria may be missing or patient reported. Any vitals not documented were not able to be obtained and vitals that have been documented are patient reported.  VideoDeclined- This patient declined Librarian, academic. Therefore the visit was completed with audio only.  Persons Participating in Visit: Patient.  AWV Questionnaire: No: Patient Medicare AWV questionnaire was not completed prior to this visit.  Cardiac Risk Factors include: advanced age (>22men, >70 women);dyslipidemia;hypertension     Objective:    Today's Vitals   04/07/24 0838  Weight: 166 lb (75.3 kg)  Height: 5' 5 (1.651 m)   Body mass index is 27.62 kg/m.     04/07/2024    8:41 AM 03/30/2023    1:50 PM 10/01/2022   11:12 AM 03/27/2022    2:18 PM 02/07/2021    9:54 AM 02/01/2020    2:43 PM 08/01/2019    2:10 PM  Advanced Directives  Does Patient Have a Medical Advance Directive? Yes Yes No Yes Yes Yes Yes  Type of Estate agent of [place];Living will Healthcare Power of [place];Living will  Healthcare Power of [place];Living will Healthcare Power of [place];Living will Healthcare Power of  [place];Living will Healthcare Power of [place];Living will  Does patient want to make changes to medical advance directive?  No - Patient declined  No - Patient declined No - Patient declined  No - Patient declined  Copy of Healthcare Power of Attorney in Chart? No - copy requested No - copy requested  No - copy requested No - copy requested No - copy requested No - copy requested  Would patient like information on creating a medical advance directive?   No - Patient declined        Current Medications (verified) Outpatient Encounter Medications as of 04/07/2024  Medication Sig   anastrozole  (ARIMIDEX ) 1 MG tablet TAKE 1 TABLET BY MOUTH EVERY DAY   Calcium  Carb-Cholecalciferol  (CALCIUM  1000 + D PO) Take 2 tablets by mouth daily. Gummies   Evolocumab  (REPATHA  SURECLICK) 140 MG/ML SOAJ INJECT 1 DOSE INTO SKIN EVERY 14 DAYS   Magnesium 250 MG TABS Take by mouth daily.   Multiple Vitamin (MULTIVITAMIN) tablet Take 1 tablet by mouth daily.   naproxen  sodium (ANAPROX ) 220 MG tablet Take 220 mg by mouth daily as needed (pain).   olmesartan -hydrochlorothiazide  (BENICAR  HCT) 40-25 MG tablet TAKE 1 TABLET BY MOUTH EVERY DAY   SHINGRIX injection    VITAMIN D  PO Take 5,000 Units by mouth. Takes 1 capsule every other day.   No facility-administered encounter medications on file as of 04/07/2024.    Allergies (verified) Lescol [fluvastatin sodium], Lipitor [atorvastatin], and Vytorin [ezetimibe -simvastatin]   History: Past Medical History:  Diagnosis Date   Allergic rhinitis, cause unspecified    Anemia  history of anemia while teenager   BRCA2 positive    Breast cancer (HCC) 2016   Left Breast Cancer   Breast cancer of upper-outer quadrant of left female breast (HCC)    chemo complete 01/2016, radiation 04/2016   DDD (degenerative disc disease)    Diverticulitis    Family history of breast cancer 08/30/2015   Dx. In maternal grandmother in her 50s-40; dx. In maternal first cousin in her  69s-60s    Family history of colon cancer 08/30/2015   Dx. In maternal uncle in his 6s    Hyperlipidemia    Hypertension    Left kidney mass 04/06/2018   Mild growth from Ct 2013 to 04/06/2018; Renal US  2020 no growth- will not follow up   Neuropathy    Osteopenia 12/2011   Spine T -0.4, Femur -2.1   Personal history of chemotherapy 2016   Left Breast Cancer   Personal history of radiation therapy 2016   Left Breast Cancer   PONV (postoperative nausea and vomiting)    Pulsatile tinnitus of right ear 10/16/2016   Due to dural av fistula. Resolved with repair     Splenic artery aneurysm (HCC) 2013   COILS DONE   Vitamin D  deficiency    Past Surgical History:  Procedure Laterality Date   BREAST LUMPECTOMY Left 2016   BREAST LUMPECTOMY WITH RADIOACTIVE SEED AND SENTINEL LYMPH NODE BIOPSY Left 08/29/2015   Procedure: LEFT BREAST LUMPECTOMY WITH RADIOACTIVE SEED WITH AXILLARY SENTINEL LYMPH NODE BIOPSY;  Surgeon: Morene Olives, MD;  Location: [facility];  Service: General;  Laterality: Left;   CERVICAL DISC SURGERY  1998   COLONOSCOPY  Jan. 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   Dural AV fistula repair   IR ANGIO EXTERNAL CAROTID SEL EXT CAROTID BILAT MOD SED  02/03/2017   IR ANGIO EXTERNAL CAROTID SEL EXT CAROTID UNI R MOD SED  03/05/2017   IR ANGIO INTRA EXTRACRAN SEL COM CAROTID INNOMINATE UNI R MOD SED  03/05/2017   IR ANGIO INTRA EXTRACRAN SEL INTERNAL CAROTID BILAT MOD SED  02/03/2017   IR ANGIO VERTEBRAL SEL VERTEBRAL BILAT MOD SED  02/03/2017   IR ANGIOGRAM FOLLOW UP STUDY  03/05/2017   IR NEURO EACH ADD'L AFTER BASIC UNI RIGHT (MS)  02/03/2017   IR NEURO EACH ADD'L AFTER BASIC UNI RIGHT (MS)  03/05/2017   IR TRANSCATH/EMBOLIZ  03/05/2017   LAPAROSCOPIC BILATERAL SALPINGO OOPHERECTOMY Bilateral 05/16/2016   Procedure: LAPAROSCOPIC BILATERAL SALPINGO OOPHORECTOMY;  Surgeon: Nathanel Bunker, MD;  Location: [facility];  Service: Gynecology;  Laterality: Bilateral;   LUMBAR DISC  SURGERY  2001, 2010   PORTACATH PLACEMENT Bilateral 10/09/2015   Procedure: INSERTION PORT-A-CATH;  Surgeon: Morene Olives, MD;  Location: [facility];  Service: General;  Laterality: Bilateral;   Portacath removal     RADIOLOGY WITH ANESTHESIA N/A 03/05/2017   Procedure: ARTERIOGRAM ONYX EMBOLIZATION OF FISTULA;  Surgeon: Lanis Pupa, MD;  Location: [facility];  Service: Radiology;  Laterality: N/A;   RE-EXCISION OF BREAST LUMPECTOMY Left 09/07/2015   Procedure: RE-EXCISION OF LEFT BREAST LUMPECTOMY;  Surgeon: Morene Olives, MD;  Location: [facility];  Service: General;  Laterality: Left;   SPINE SURGERY  1998   cervical diskectomy   SPINE SURGERY  2001, 2010   Lumbar diskectomy   splenic aneurysm  02/10/2012   Aneurysm of splenic artery, coil placed, Dr. Army   TONSILLECTOMY  1968   VISCERAL ANGIOGRAM N/A 02/10/2012   Procedure: VISCERAL  ANGIOGRAM;  Surgeon: Gaile LELON New, MD;  Location: [facility];  Service: Cardiovascular;  Laterality: N/A;   Family History  Problem Relation Age of Onset   Stroke Mother    Osteoporosis Mother    Hyperlipidemia Mother    Hypertension Mother    Other Mother        varicose veins   Deep vein thrombosis Mother    Diverticulitis Mother    Breast cancer Maternal Grandmother        dx. 43s-40s; when pt's mother was 44 years old   Other Father        bleeding problems   Hypertension Father    Heart attack Father    Stroke Father    Other Sister        one sister had a hysterectomy for fibroids   Vasculitis Sister    Leukemia Maternal Aunt        dx. 24s-60s   Pancreatic cancer Maternal Uncle        dx. late 60s-early 48s; (x2 maternal uncles)   Heart attack Paternal Aunt    Stroke Paternal Aunt    Stroke Maternal Grandfather    Heart attack Paternal Grandmother    Stroke Paternal Grandmother    Heart Problems Paternal Grandfather    Colon cancer Maternal Uncle        dx. late 70s   Heart defect Maternal Aunt         possible   Breast cancer Cousin        dx. 50s-60s; maternal first   Heart attack Paternal Uncle    Stroke Paternal Uncle    Lung cancer Maternal Uncle        treated at [facility]; d. 30s   Social History   Socioeconomic History   Marital status: Married    Spouse name: Not on file   Number of children: Not on file   Years of education: Not on file   Highest education level: Not on file  Occupational History   Not on file  Tobacco Use   Smoking status: Never   Smokeless tobacco: Never  Vaping Use   Vaping status: Never Used  Substance and Sexual Activity   Alcohol use: Not Currently   Drug use: No   Sexual activity: Not Currently    Partners: Male    Birth control/protection: Post-menopausal  Other Topics Concern   Not on file  Social History Narrative   Not on file   Social Drivers of Health   Financial Resource Strain: Low Risk  (04/07/2024)   Overall Financial Resource Strain (CARDIA)    Difficulty of Paying Living Expenses: Not hard at all  Food Insecurity: No Food Insecurity (04/07/2024)   Hunger Vital Sign    Worried About Running Out of Food in the Last Year: Never true    Ran Out of Food in the Last Year: Never true  Transportation Needs: No Transportation Needs (04/07/2024)   PRAPARE - Administrator, Civil Service (Medical): No    Lack of Transportation (Non-Medical): No  Physical Activity: Insufficiently Active (04/07/2024)   Exercise Vital Sign    Days of Exercise per Week: 5 days    Minutes of Exercise per Session: 20 min  Stress: No Stress Concern Present (04/07/2024)   [employer] of Occupational Health - Occupational Stress Questionnaire    Feeling of Stress: Not at all  Social Connections: Moderately Isolated (04/07/2024)   Social Connection and Isolation Panel  Frequency of Communication with Friends and Family: More than three times a week    Frequency of Social Gatherings with Friends and Family: More than three times a week    Attends  Religious Services: Never    Database administrator or Organizations: No    Attends Engineer, structural: Never    Marital Status: Married    Tobacco Counseling Counseling given: Not Answered    Clinical Intake:  Pre-visit preparation completed: Yes  Pain : No/denies pain     BMI - recorded: 27.62 Nutritional Status: BMI 25 -29 Overweight Nutritional Risks: None Diabetes: No  Lab Results  Component Value Date   HGBA1C 5.5 03/07/2024   HGBA1C 5.5 03/07/2024   HGBA1C 5.5 (A) 03/07/2024   HGBA1C 5.5 03/07/2024     How often do you need to have someone help you when you read instructions, pamphlets, or other written materials from your doctor or pharmacy?: 1 - Never  Interpreter Needed?: No  Information entered by :: Ellouise Haws, LPN   Activities of Daily Living     04/07/2024    8:40 AM 09/01/2023   12:00 AM  In your present state of health, do you have any difficulty performing the following activities:  Hearing? 0 0  Vision? 0 0  Difficulty concentrating or making decisions? 0 0  Walking or climbing stairs? 0 0  Dressing or bathing? 0 0  Doing errands, shopping? 0 0  Preparing Food and eating ? N   Using the Toilet? N   In the past six months, have you accidently leaked urine? N   Do you have problems with loss of bowel control? N   Managing your Medications? N   Managing your Finances? N   Housekeeping or managing your Housekeeping? N     Patient Care Team: Jodie Lavern CROME, MD as PCP - General (Family Medicine) Serene Gaile ORN, MD as Consulting Physician (Vascular Surgery) Rollin Dover, MD as Consulting Physician (Gastroenterology) [place], Amy, MD as Consulting Physician (Dermatology) Odean Potts, MD as Consulting Physician (Hematology and Oncology) Rosan Credit, MD as Consulting Physician (Ophthalmology) Estelle Service, MD as Consulting Physician (Obstetrics and Gynecology)  I have updated your Care Teams any recent Medical  Services you may have received from other providers in the past year.     Assessment:   This is a routine wellness examination for [place].  Hearing/Vision screen Hearing Screening - Comments:: Pt denies any hearing issues  Vision Screening - Comments:: Wears rx glasses - up to date with routine eye exams with Dr adine Way[facility]    Goals Addressed             This Visit's Progress    Patient Stated       Weight loss        Depression Screen     04/07/2024    8:42 AM 09/06/2023    7:43 PM 05/27/2022    8:13 PM 02/12/2022    2:46 PM 11/05/2021    2:04 PM 06/11/2021   12:47 AM 02/07/2021    9:57 AM  PHQ 2/9 Scores  PHQ - 2 Score 0 0 0 0 0 0 0    Fall Risk     04/07/2024    8:43 AM 08/31/2023   11:59 PM 05/27/2022    8:13 PM 02/12/2022    2:45 PM 06/11/2021   12:47 AM  Fall Risk   Falls in the past year? 0 0 0 0 0  Number falls in past yr: 0   0   Injury with Fall? 0   0   Risk for fall due to : No Fall Risks No Fall Risks No Fall Risks  No Fall Risks  Follow up Falls prevention discussed Falls prevention discussed;Education provided;Falls evaluation completed Falls prevention discussed;Education provided;Falls evaluation completed   Falls evaluation completed;Education provided;Falls prevention discussed      Data saved with a previous flowsheet row definition    MEDICARE RISK AT HOME:  Medicare Risk at Home Any stairs in or around the home?: Yes If so, are there any without handrails?: No Home free of loose throw rugs in walkways, pet beds, electrical cords, etc?: Yes Adequate lighting in your home to reduce risk of falls?: Yes Life alert?: No Use of a cane, walker or w/c?: No Grab bars in the bathroom?: Yes Shower chair or bench in shower?: Yes Elevated toilet seat or a handicapped toilet?: Yes  TIMED UP AND GO:  Was the test performed?  No  Cognitive Function: 6CIT completed        04/07/2024    8:44 AM  6CIT Screen  What Year? 0  points  What month? 0 points  What time? 0 points  Count back from 20 0 points  Months in reverse 0 points  Repeat phrase 0 points  Total Score 0 points    Immunizations Immunization History  Administered Date(s) Administered   DT (Pediatric) 01/22/2015   Influenza Split 06/08/2014, 06/02/2016   Influenza, High Dose Seasonal PF 06/08/2014, 05/27/2017, 06/01/2018, 06/02/2019, 06/01/2020, 05/31/2022   Influenza-Unspecified 06/14/2013, 06/04/2015, 05/27/2017   Meningococcal Conjugate 09/02/2011   Moderna Sars-Covid-2 Vaccination 10/17/2019, 11/15/2019   PFIZER(Purple Top)SARS-COV-2 Vaccination 06/27/2020, 01/22/2021   PPD Test 01/18/2014   Pfizer Covid-19 Vaccine Bivalent Booster 81yrs & up 06/07/2021   Pfizer(Comirnaty)Fall Seasonal Vaccine 12 years and older 07/04/2022   Pneumococcal Conjugate-13 12/31/2011, 06/17/2017   Pneumococcal Polysaccharide-23 09/02/2011   Pneumococcal-Unspecified 09/22/1995, 09/02/2011   Td 11/27/1994, 04/29/2004, 01/22/2015   Zoster Recombinant(Shingrix) 01/29/2024    Screening Tests Health Maintenance  Topic Date Due   COVID-19 Vaccine (7 - 2024-25 season) 05/03/2023   Zoster Vaccines- Shingrix (2 of 2) 03/25/2024   INFLUENZA VACCINE  04/01/2024   DEXA SCAN  09/23/2024   Fecal DNA (Cologuard)  11/14/2024   DTaP/Tdap/Td (5 - Tdap) 01/21/2025   MAMMOGRAM  03/25/2025   Medicare Annual Wellness (AWV)  04/07/2025   Hepatitis C Screening  Completed   Hepatitis B Vaccines  Aged Out   HPV VACCINES  Aged Out   Meningococcal B Vaccine  Aged Out   Pneumococcal Vaccine: 50+ Years  Discontinued   Colonoscopy  Discontinued    Health Maintenance  Health Maintenance Due  Topic Date Due   COVID-19 Vaccine (7 - 2024-25 season) 05/03/2023   Zoster Vaccines- Shingrix (2 of 2) 03/25/2024   INFLUENZA VACCINE  04/01/2024   Health Maintenance Items Addressed: See Nurse Notes at the end of this note  Additional Screening:  Vision Screening: Recommended  annual ophthalmology exams for early detection of glaucoma and other disorders of the eye. Would you like a referral to an eye doctor? No    Dental Screening: Recommended annual dental exams for proper oral hygiene  Community Resource Referral / Chronic Care Management: CRR required this visit?  No   CCM required this visit?  No   Plan:    I have personally reviewed and noted the following in the patient's chart:   Medical and social  history Use of alcohol, tobacco or illicit drugs  Current medications and supplements including opioid prescriptions. Patient is not currently taking opioid prescriptions. Functional ability and status Nutritional status Physical activity Advanced directives List of other physicians Hospitalizations, surgeries, and ER visits in previous 12 months Vitals Screenings to include cognitive, depression, and falls Referrals and appointments  In addition, I have reviewed and discussed with patient certain preventive protocols, quality metrics, and best practice recommendations. A written personalized care plan for preventive services as well as general preventive health recommendations were provided to patient.   Ellouise VEAR Haws, LPN   09/02/7972   After Visit Summary: (MyChart) Due to this being a telephonic visit, the after visit summary with patients personalized plan was offered to patient via MyChart   Notes: Nothing significant to report at this time.

## 2024-04-07 NOTE — Patient Instructions (Signed)
 Ms. Stierwalt , Thank you for taking time out of your busy schedule to complete your Annual Wellness Visit with me. I enjoyed our conversation and look forward to speaking with you again next year. I, as well as your care team,  appreciate your ongoing commitment to your health goals. Please review the following plan we discussed and let me know if I can assist you in the future. Your Game plan/ To Do List    Referrals: If you haven't heard from the office you've been referred to, please reach out to them at the phone provided.   Follow up Visits: We will see or speak with you next year for your Next Medicare AWV with our clinical staff Have you seen your provider in the last 6 months (3 months if uncontrolled diabetes)? Yes  Clinician Recommendations:  Aim for 30 minutes of exercise or brisk walking, 6-8 glasses of water, and 5 servings of fruits and vegetables each day.       This is a list of the screenings recommended for you:  Health Maintenance  Topic Date Due   COVID-19 Vaccine (7 - 2024-25 season) 05/03/2023   Medicare Annual Wellness Visit  02/19/2024   Zoster (Shingles) Vaccine (2 of 2) 03/25/2024   Flu Shot  04/01/2024   DEXA scan (bone density measurement)  09/23/2024   Cologuard (Stool DNA test)  11/14/2024   DTaP/Tdap/Td vaccine (5 - Tdap) 01/21/2025   Mammogram  03/25/2025   Hepatitis C Screening  Completed   Hepatitis B Vaccine  Aged Out   HPV Vaccine  Aged Out   Meningitis B Vaccine  Aged Out   Pneumococcal Vaccine for age over 41  Discontinued   Colon Cancer Screening  Discontinued    Advanced directives: (Copy Requested) Please bring a copy of your health care power of attorney and living will to the office to be added to your chart at your convenience. You can mail to [facility] [address] [place], [place] [address] or email to [email or web address] Advance Care Planning is important because it:  [x]  Makes sure you receive the medical  care that is consistent with your values, goals, and preferences  [x]  It provides guidance to your family and loved ones and reduces their decisional burden about whether or not they are making the right decisions based on your wishes.  Follow the link provided in your after visit summary or read over the paperwork we have mailed to you to help you started getting your Advance Directives in place. If you need assistance in completing these, please reach out to us  so that we can help you!  See attachments for Preventive Care and Fall Prevention Tips.

## 2024-04-13 NOTE — Assessment & Plan Note (Signed)
 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 Patient does not need axillary lymph node dissection based on current NCCN guidelines and ACOSOG Z 11 clinical trial; luminal type B and high-risk phenotype. Risk of relapse without chemotherapy 24% versus 12% with chemotherapy.   Treatment summary: 1. Dose dense Adriamycin  and Cytoxan  4 followed by Abraxane  weekly 12  started 10/11/2015 completed 02/21/2016 2. Adjuvant radiation therapy started 03/27/2016 completed 04/23/2016 3. Adjuvant antiestrogen therapy started 05/22/2016 ----------------------------------------------------------------------------------------------------------------- Current treatment: Antiestrogen therapy with anastrozole  1 mg daily to start 06/01/2016 BRCA2 mutation: Oophorectomy 05/16/2016: No malignancy   Anastrozole  Toxicities:  Able to tolerate anastrozole  fairly well. Denies any hot flashes or myalgias.  She would like to stay on antiestrogen therapy indefinitely.   Surveillance of breast cancer: 1. Mammograms 09/28/2023: Benign, breast density category B 2. MRI breast 03/25/2024. (Because of BRCA2 mutation): No evidence of recurrent breast cancer, 3.  Breast exam 04/14/24: Benign   Osteoporosis: Bone density J 09/23/2020: T score -3.2 Discussion therapy: I strongly recommended bisphosphonate.  I recommended starting her on Prolia  next week. Her husband is completely disabled and requires her assistance.     Return to clinic 1 year for follow-up with another breast MRI

## 2024-04-14 ENCOUNTER — Inpatient Hospital Stay: Attending: Hematology and Oncology | Admitting: Hematology and Oncology

## 2024-04-14 VITALS — BP 152/62 | HR 77 | Temp 97.6°F | Resp 18 | Ht 65.0 in | Wt 171.5 lb

## 2024-04-14 DIAGNOSIS — Z1721 Progesterone receptor positive status: Secondary | ICD-10-CM | POA: Diagnosis not present

## 2024-04-14 DIAGNOSIS — Z923 Personal history of irradiation: Secondary | ICD-10-CM | POA: Insufficient documentation

## 2024-04-14 DIAGNOSIS — Z1501 Genetic susceptibility to malignant neoplasm of breast: Secondary | ICD-10-CM | POA: Insufficient documentation

## 2024-04-14 DIAGNOSIS — Z1509 Genetic susceptibility to other malignant neoplasm: Secondary | ICD-10-CM | POA: Diagnosis not present

## 2024-04-14 DIAGNOSIS — Z170 Estrogen receptor positive status [ER+]: Secondary | ICD-10-CM | POA: Insufficient documentation

## 2024-04-14 DIAGNOSIS — Z9221 Personal history of antineoplastic chemotherapy: Secondary | ICD-10-CM | POA: Insufficient documentation

## 2024-04-14 DIAGNOSIS — M810 Age-related osteoporosis without current pathological fracture: Secondary | ICD-10-CM | POA: Insufficient documentation

## 2024-04-14 DIAGNOSIS — C50412 Malignant neoplasm of upper-outer quadrant of left female breast: Secondary | ICD-10-CM | POA: Insufficient documentation

## 2024-04-14 DIAGNOSIS — Z1732 Human epidermal growth factor receptor 2 negative status: Secondary | ICD-10-CM | POA: Insufficient documentation

## 2024-04-14 DIAGNOSIS — Z800 Family history of malignant neoplasm of digestive organs: Secondary | ICD-10-CM | POA: Diagnosis not present

## 2024-04-14 DIAGNOSIS — Z780 Asymptomatic menopausal state: Secondary | ICD-10-CM

## 2024-04-14 DIAGNOSIS — Z79811 Long term (current) use of aromatase inhibitors: Secondary | ICD-10-CM | POA: Diagnosis not present

## 2024-04-14 MED ORDER — ANASTROZOLE 1 MG PO TABS
1.0000 mg | ORAL_TABLET | Freq: Every day | ORAL | 3 refills | Status: AC
Start: 2024-04-14 — End: ?

## 2024-04-14 MED ORDER — ALENDRONATE SODIUM 70 MG PO TABS
70.0000 mg | ORAL_TABLET | ORAL | 3 refills | Status: AC
Start: 2024-04-14 — End: ?

## 2024-04-14 NOTE — Progress Notes (Signed)
 Patient Care Team: Jodie Lavern CROME, MD as PCP - General (Family Medicine) Serene Gaile ORN, MD as Consulting Physician (Vascular Surgery) Rollin Dover, MD as Consulting Physician (Gastroenterology) [place], Amy, MD as Consulting Physician (Dermatology) Odean Potts, MD as Consulting Physician (Hematology and Oncology) Rosan Credit, MD as Consulting Physician (Ophthalmology) Estelle Service, MD as Consulting Physician (Obstetrics and Gynecology)  DIAGNOSIS:  Encounter Diagnosis  Name Primary?   Malignant neoplasm of upper-outer quadrant of left breast in female, estrogen receptor positive (HCC) Yes    SUMMARY OF ONCOLOGIC HISTORY: Oncology History  Breast cancer of upper-outer quadrant of left female breast (HCC)  08/03/2015 Mammogram   Left Breast: 6 mm mass @ 1 o clock   08/09/2015 Initial Diagnosis   Left breast 1:00 position: Invasive ductal carcinoma grade 1-2, ER 100%, PR 10%, HER-2 negative ratio 1.79, Ki-67 15%, T1 BN 0 stage I a clinical stage   08/29/2015 Surgery   Left lumpectomy ([facility]): invasive ductal carcinoma 1.2 cm with LVID, with DCIS intermediate grade, 1 sentinel node positive, ER 100%, PR 10%, HER-2 negative ratio 1.79, Ki-67 15% , margins negative, Mammaprint high risk, luminal B, T1cN1 stage II a   08/29/2015 Procedure   Mammaprint: High-risk, Luminal type   09/07/2015 Surgery   Left breast re-excision ([facility]): ADH, no malignancy, negative margins.    09/12/2015 Procedure   BRCA2 mutation [id number]. Also VUS on CHEK2.  Otherwise negative. Genes analyzed:  ATM, BARD1, BRCA1, BRCA2, BRIP1, CDH1, CHEK2, FANCC, MLH1, MSH2, MSH6, NBN, PALB2, PMS2, PTEN, RAD51C, RAD51D, TP53, and XRCC2; deletion/duplication analysis (without sequencing) for EPCAM.   10/11/2015 - 02/21/2016 Chemotherapy   Adjuvant chemotherapy with dose dense Adriamycin  and Cytoxan  4 followed by Abraxane  weekly 12   03/27/2016 - 04/23/2016 Radiation Therapy   Adjuvant  radiation therapy Valene). Left breast: 42.5 Gy in 17 fractions. Left breast boost: 7.5 Gy in 3 fractions.    05/16/2016 Surgery   Bilateral salpingo-oophorectomies: No cancer   06/05/2016 -  Anti-estrogen oral therapy   Anastrozole  1 mg by mouth daily     CHIEF COMPLIANT:   HISTORY OF PRESENT ILLNESS: History of Present Illness  one of the and Elizabethann has likeLinda MILINDA SWEENEY is a 73 year old female with osteoporosis who presents with increased fatigue and joint pain after starting Prolia .  Since starting Prolia , fatigue and joint pain have significantly increased, with the second injection in January worsening these symptoms. Fatigue is persistent and more pronounced than after the first injection.  She has osteoporosis and was previously treated with Fosamax . She has undergone multiple back surgeries and is cautious about falls due to her bone condition.  There is no chest pain. Her family history includes pancreatic cancer. She is currently on anastrozole  and had a breast MRI in July.     ALLERGIES:  is allergic to lescol [fluvastatin sodium], lipitor [atorvastatin], and vytorin [ezetimibe -simvastatin].  MEDICATIONS:  Current Outpatient Medications  Medication Sig Dispense Refill   anastrozole  (ARIMIDEX ) 1 MG tablet TAKE 1 TABLET BY MOUTH EVERY DAY 90 tablet 3   Calcium  Carb-Cholecalciferol  (CALCIUM  1000 + D PO) Take 2 tablets by mouth daily. Gummies     Evolocumab  (REPATHA  SURECLICK) 140 MG/ML SOAJ INJECT 1 DOSE INTO SKIN EVERY 14 DAYS 6 mL 3   Magnesium 250 MG TABS Take by mouth daily.     Multiple Vitamin (MULTIVITAMIN) tablet Take 1 tablet by mouth daily.     naproxen  sodium (ANAPROX ) 220 MG tablet Take 220 mg by mouth daily as needed (  pain).     olmesartan -hydrochlorothiazide  (BENICAR  HCT) 40-25 MG tablet TAKE 1 TABLET BY MOUTH EVERY DAY 30 tablet 11   SHINGRIX injection      VITAMIN D  PO Take 5,000 Units by mouth. Takes 1 capsule every other day.     No current  facility-administered medications for this visit.    PHYSICAL EXAMINATION: ECOG PERFORMANCE STATUS: 1 - Symptomatic but completely ambulatory  Vitals:   04/14/24 1455  BP: (!) 152/62  Pulse: 77  Resp: 18  Temp: 97.6 F (36.4 C)  SpO2: 99%   Filed Weights   04/14/24 1455  Weight: 171 lb 8 oz (77.8 kg)    Physical Exam   (exam performed in the presence of a chaperone)  LABORATORY DATA:  I have reviewed the data as listed    Latest Ref Rng & Units 10/27/2023    1:38 PM 09/01/2023    2:17 PM 05/25/2023    2:17 PM  CMP  Glucose 70 - 99 mg/dL 91  81  91   BUN 8 - 23 mg/dL 16  21  16    Creatinine 0.44 - 1.00 mg/dL 9.30  9.24  9.32   Sodium 135 - 145 mmol/L 132  132  131   Potassium 3.5 - 5.1 mmol/L 4.0  4.2  4.4   Chloride 98 - 111 mmol/L 99  97  97   CO2 22 - 32 mmol/L 26  25  23    Calcium  8.9 - 10.3 mg/dL 9.3  9.5  9.3   Total Protein 6.5 - 8.1 g/dL 7.3  7.3  7.3   Total Bilirubin 0.0 - 1.2 mg/dL 0.3  0.5  0.5   Alkaline Phos 38 - 126 U/L 41     AST 15 - 41 U/L 27  29  30    ALT 0 - 44 U/L 27  29  28      Lab Results  Component Value Date   WBC 6.9 10/27/2023   HGB 12.3 10/27/2023   HCT 36.1 10/27/2023   MCV 91.6 10/27/2023   PLT 316 10/27/2023   NEUTROABS 4.7 10/27/2023    ASSESSMENT & PLAN:  Breast cancer of upper-outer quadrant of left female breast (HCC) Left lumpectomy 08/29/2015: Invasive ductal carcinoma 1.2 cm with LVID, with DCIS intermediate grade, 1 sentinel node positive, ER 100%, PR 10%, HER-2 negative ratio 1.79, Ki-67 15% , margins negative, Mammaprint high risk, luminal B, T1cN1 stage II a. Patient does not need axillary lymph node dissection based on current NCCN guidelines and ACOSOG Z 11 clinical trial; luminal type B and high-risk phenotype. Risk of relapse without chemotherapy 24% versus 12% with chemotherapy.   Treatment summary: 1. Dose dense Adriamycin  and Cytoxan  4 followed by Abraxane  weekly 12  started 10/11/2015 completed  02/21/2016 2. Adjuvant radiation therapy started 03/27/2016 completed 04/23/2016 3. Adjuvant antiestrogen therapy started 05/22/2016 ----------------------------------------------------------------------------------------------------------------- Current treatment: Antiestrogen therapy with anastrozole  1 mg daily to start 06/01/2016 BRCA2 mutation: Oophorectomy 05/16/2016: No malignancy   Anastrozole  Toxicities:  Able to tolerate anastrozole  fairly well. Denies any hot flashes or myalgias.  She would like to stay on antiestrogen therapy indefinitely.   Surveillance of breast cancer: 1. Mammograms 09/28/2023: Benign, breast density category B 2. MRI breast 03/25/2024. (Because of BRCA2 mutation): No evidence of recurrent breast cancer, 3.  Breast exam 04/14/24: Benign   Osteoporosis: Bone density J 09/23/2020: T score -3.2 Discussion therapy: I strongly recommended bisphosphonate.  I recommended starting her on Prolia  next week. Her husband is completely disabled and  requires her assistance.     I would like to refer her to gastroenterology for pancreatic cancer surveillance because of her BRCA mutation.   Return to clinic 1 year for follow-up with another breast MRI  Assessment & Plan History of malignant neoplasm of upper-outer quadrant of left breast, status post treatment, on adjuvant hormone therapy Nine years post-breast cancer diagnosis, on anastrozole . Concerned about side effects of discontinuing therapy and recurrence risk. Decided to continue current regimen. - Send refill for anastrozole  to [facility] in [place]. - Continue alternating MRI and mammogram every six months.  Osteoporosis management and bisphosphonate therapy adjustment Increased fatigue and joint pain after second Prolia  injection. Discussed risks of discontinuation. Prefers switching to Fosamax  due to side effects. - Cancel Prolia  injection. - Send prescription for Fosamax . - Order bone density scan for January  2026.  Genetic susceptibility to malignant neoplasm of breast with consideration for pancreatic cancer screening Family history of pancreatic cancer. Interested in screening. Risk is low but screening is reasonable. - Refer to Dr. Claretha, gastroenterologist, for pancreatic cancer screening. - Set up MRI of the pancreas.      No orders of the defined types were placed in this encounter.  The patient has a good understanding of the overall plan. she agrees with it. she will call with any problems that may develop before the next visit here. Total time spent: 30 mins including face to face time and time spent for planning, charting and co-ordination of care   Naomi MARLA Chad, MD 04/14/24

## 2024-04-18 ENCOUNTER — Other Ambulatory Visit: Payer: Self-pay

## 2024-04-18 DIAGNOSIS — C50412 Malignant neoplasm of upper-outer quadrant of left female breast: Secondary | ICD-10-CM

## 2024-04-19 ENCOUNTER — Other Ambulatory Visit: Payer: Self-pay | Admitting: Hematology and Oncology

## 2024-04-19 ENCOUNTER — Inpatient Hospital Stay: Payer: Medicare Other

## 2024-04-19 DIAGNOSIS — Z1231 Encounter for screening mammogram for malignant neoplasm of breast: Secondary | ICD-10-CM

## 2024-05-23 DIAGNOSIS — Z1501 Genetic susceptibility to malignant neoplasm of breast: Secondary | ICD-10-CM | POA: Diagnosis not present

## 2024-05-23 DIAGNOSIS — Z853 Personal history of malignant neoplasm of breast: Secondary | ICD-10-CM | POA: Diagnosis not present

## 2024-05-23 DIAGNOSIS — Z01419 Encounter for gynecological examination (general) (routine) without abnormal findings: Secondary | ICD-10-CM | POA: Diagnosis not present

## 2024-06-06 ENCOUNTER — Ambulatory Visit: Payer: Medicare Other | Admitting: Internal Medicine

## 2024-06-08 ENCOUNTER — Ambulatory Visit: Attending: Cardiology | Admitting: Internal Medicine

## 2024-06-08 ENCOUNTER — Encounter: Payer: Self-pay | Admitting: Internal Medicine

## 2024-06-08 VITALS — BP 148/80 | HR 81 | Ht 65.0 in | Wt 169.0 lb

## 2024-06-08 DIAGNOSIS — M791 Myalgia, unspecified site: Secondary | ICD-10-CM | POA: Insufficient documentation

## 2024-06-08 DIAGNOSIS — T466X5D Adverse effect of antihyperlipidemic and antiarteriosclerotic drugs, subsequent encounter: Secondary | ICD-10-CM

## 2024-06-08 DIAGNOSIS — E782 Mixed hyperlipidemia: Secondary | ICD-10-CM | POA: Diagnosis not present

## 2024-06-08 DIAGNOSIS — T466X5A Adverse effect of antihyperlipidemic and antiarteriosclerotic drugs, initial encounter: Secondary | ICD-10-CM | POA: Insufficient documentation

## 2024-06-08 DIAGNOSIS — I700 Atherosclerosis of aorta: Secondary | ICD-10-CM | POA: Diagnosis not present

## 2024-06-08 NOTE — Progress Notes (Signed)
 LIPID CLINIC CONSULT NOTE  Chief Complaint:  Follow-up dyslipidemia  Primary Care Physician: Jodie Lavern CROME, MD  Primary Cardiologist:  None  HPI:  Jacqueline Jenkins is a 73 y.o. female who is being seen today for the evaluation of dyslipidemia at the request of Jodie Lavern CROME, MD.  This is a pleasant 73 year old female with a history of breast cancer and BRCA2 mutation on anastrozole , who is referred for evaluation and management of dyslipidemia.  She had repeat lipids 5 days ago showing total cholesterol 272, HDL 58, triglycerides 174 and LDL of 182.  Her LDL has been well over 200 in the past.  She has been on intermittent therapy in the past including with Vytorin and Lescol which cause myalgias and atorvastatin however causing significant elevation in her liver enzymes that led to discontinuation of the medication.  She is noted to have hepatic steatosis by imaging studies however no prior studies has shown any coronary artery calcification.  She had carotid Dopplers in 2018 which showed minimal carotid irregularities.  She also has a history of a splenic artery aneurysm however no evidence of any mesenteric or abdominal aortic atherosclerosis.  There does appear to be a family history of heart disease both in her parents including on her father's side and her uncles on her father side.  She does not have diabetes but does have hypertension which has been well controlled.  He did have an echocardiogram as part of her chemotherapy in February 2017 which showed a normal EF 55 to 60%.  03/26/2020  Jacqueline Jenkins returns today for follow-up.  We eventually were able to get her on Repatha .  She says she is tolerating the injections.  Initially is expensive but then she is paying $39 a month afterwards.  This is overall affordable and she is pleased with it.  Unfortunately she has not had a repeat assessment of her lipids in the past 4 months.  06/08/2024  Jacqueline Jenkins is seen today in follow-up.  She  continues on Repatha .  She has not had labs since December 2024.  She says she recently took her last Repatha  shot and needs reauthorization of her medication.  She was previously followed by Dr. Tonita but has a new primary care provider.   PMHx:  Past Medical History:  Diagnosis Date   Allergic rhinitis, cause unspecified    Anemia    history of anemia while teenager   BRCA2 positive    Breast cancer (HCC) 2016   Left Breast Cancer   Breast cancer of upper-outer quadrant of left female breast (HCC)    chemo complete 01/2016, radiation 04/2016   DDD (degenerative disc disease)    Diverticulitis    Family history of breast cancer 08/30/2015   Dx. In maternal grandmother in her 22s-40; dx. In maternal first cousin in her 54s-60s    Family history of colon cancer 08/30/2015   Dx. In maternal uncle in his 26s    Hyperlipidemia    Hypertension    Left kidney mass 04/06/2018   Mild growth from Ct 2013 to 04/06/2018; Renal US  2020 no growth- will not follow up   Neuropathy    Osteopenia 12/2011   Spine T -0.4, Femur -2.1   Personal history of chemotherapy 2016   Left Breast Cancer   Personal history of radiation therapy 2016   Left Breast Cancer   PONV (postoperative nausea and vomiting)    Pulsatile tinnitus of right ear 10/16/2016   Due to  dural av fistula. Resolved with repair     Splenic artery aneurysm 2013   COILS DONE   Vitamin D  deficiency     Past Surgical History:  Procedure Laterality Date   BREAST LUMPECTOMY Left 2016   BREAST LUMPECTOMY WITH RADIOACTIVE SEED AND SENTINEL LYMPH NODE BIOPSY Left 08/29/2015   Procedure: LEFT BREAST LUMPECTOMY WITH RADIOACTIVE SEED WITH AXILLARY SENTINEL LYMPH NODE BIOPSY;  Surgeon: Morene Olives, MD;  Location: [facility];  Service: General;  Laterality: Left;   CERVICAL DISC SURGERY  1998   COLONOSCOPY  Jan. 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   Dural AV fistula repair   IR ANGIO EXTERNAL CAROTID SEL EXT CAROTID  BILAT MOD SED  02/03/2017   IR ANGIO EXTERNAL CAROTID SEL EXT CAROTID UNI R MOD SED  03/05/2017   IR ANGIO INTRA EXTRACRAN SEL COM CAROTID INNOMINATE UNI R MOD SED  03/05/2017   IR ANGIO INTRA EXTRACRAN SEL INTERNAL CAROTID BILAT MOD SED  02/03/2017   IR ANGIO VERTEBRAL SEL VERTEBRAL BILAT MOD SED  02/03/2017   IR ANGIOGRAM FOLLOW UP STUDY  03/05/2017   IR NEURO EACH ADD'L AFTER BASIC UNI RIGHT (MS)  02/03/2017   IR NEURO EACH ADD'L AFTER BASIC UNI RIGHT (MS)  03/05/2017   IR TRANSCATH/EMBOLIZ  03/05/2017   LAPAROSCOPIC BILATERAL SALPINGO OOPHERECTOMY Bilateral 05/16/2016   Procedure: LAPAROSCOPIC BILATERAL SALPINGO OOPHORECTOMY;  Surgeon: Nathanel Bunker, MD;  Location: [facility];  Service: Gynecology;  Laterality: Bilateral;   LUMBAR DISC SURGERY  2001, 2010   PORTACATH PLACEMENT Bilateral 10/09/2015   Procedure: INSERTION PORT-A-CATH;  Surgeon: Morene Olives, MD;  Location: [facility];  Service: General;  Laterality: Bilateral;   Portacath removal     RADIOLOGY WITH ANESTHESIA N/A 03/05/2017   Procedure: ARTERIOGRAM ONYX EMBOLIZATION OF FISTULA;  Surgeon: Lanis Pupa, MD;  Location: [facility];  Service: Radiology;  Laterality: N/A;   RE-EXCISION OF BREAST LUMPECTOMY Left 09/07/2015   Procedure: RE-EXCISION OF LEFT BREAST LUMPECTOMY;  Surgeon: Morene Olives, MD;  Location: [facility];  Service: General;  Laterality: Left;   SPINE SURGERY  1998   cervical diskectomy   SPINE SURGERY  2001, 2010   Lumbar diskectomy   splenic aneurysm  02/10/2012   Aneurysm of splenic artery, coil placed, Dr. Army   TONSILLECTOMY  1968   VISCERAL ANGIOGRAM N/A 02/10/2012   Procedure: VISCERAL ANGIOGRAM;  Surgeon: Gaile LELON New, MD;  Location: [facility];  Service: Cardiovascular;  Laterality: N/A;    FAMHx:  Family History  Problem Relation Age of Onset   Stroke Mother    Osteoporosis Mother    Hyperlipidemia Mother    Hypertension Mother    Other Mother        varicose veins   Deep vein  thrombosis Mother    Diverticulitis Mother    Breast cancer Maternal Grandmother        dx. 43s-40s; when pt's mother was 52 years old   Other Father        bleeding problems   Hypertension Father    Heart attack Father    Stroke Father    Other Sister        one sister had a hysterectomy for fibroids   Vasculitis Sister    Leukemia Maternal Aunt        dx. 3s-60s   Pancreatic cancer Maternal Uncle        dx. late 60s-early 70s; (x2 maternal uncles)   Heart attack  Paternal Aunt    Stroke Paternal Aunt    Stroke Maternal Grandfather    Heart attack Paternal Grandmother    Stroke Paternal Grandmother    Heart Problems Paternal Grandfather    Colon cancer Maternal Uncle        dx. late 70s   Heart defect Maternal Aunt        possible   Breast cancer Cousin        dx. 50s-60s; maternal first   Heart attack Paternal Uncle    Stroke Paternal Uncle    Lung cancer Maternal Uncle        treated at [facility]; d. 35s    SOCHx:   reports that she has never smoked. She has never used smokeless tobacco. She reports that she does not currently use alcohol. She reports that she does not use drugs.  ALLERGIES:  Allergies  Allergen Reactions   Lescol [Fluvastatin Sodium] Other (See Comments)    Myalgia   Lipitor [Atorvastatin] Other (See Comments)    Increased LFT's   Vytorin [Ezetimibe -Simvastatin] Other (See Comments)    myalgia    ROS: Pertinent items noted in HPI and remainder of comprehensive ROS otherwise negative.  HOME MEDS: Current Outpatient Medications on File Prior to Visit  Medication Sig Dispense Refill   alendronate  (FOSAMAX ) 70 MG tablet Take 1 tablet (70 mg total) by mouth once a week. Take with a full glass of water on an empty stomach. 12 tablet 3   anastrozole  (ARIMIDEX ) 1 MG tablet Take 1 tablet (1 mg total) by mouth daily. 90 tablet 3   Calcium  Carb-Cholecalciferol  (CALCIUM  1000 + D PO) Take 2 tablets by mouth daily. Gummies     Evolocumab  (REPATHA  SURECLICK)  140 MG/ML SOAJ INJECT 1 DOSE INTO SKIN EVERY 14 DAYS 6 mL 3   Magnesium 250 MG TABS Take by mouth daily.     Multiple Vitamin (MULTIVITAMIN) tablet Take 1 tablet by mouth daily.     naproxen  sodium (ANAPROX ) 220 MG tablet Take 220 mg by mouth daily as needed (pain).     olmesartan -hydrochlorothiazide  (BENICAR  HCT) 40-25 MG tablet TAKE 1 TABLET BY MOUTH EVERY DAY 30 tablet 11   SHINGRIX injection      VITAMIN D  PO Take 5,000 Units by mouth. Takes 1 capsule every other day.     No current facility-administered medications on file prior to visit.    LABS/IMAGING: No results found for this or any previous visit (from the past 48 hours). No results found.  LIPID PANEL:    Component Value Date/Time   CHOL 177 09/01/2023 1417   CHOL 180 03/26/2020 1207   TRIG 116 09/01/2023 1417   HDL 69 09/01/2023 1417   HDL 67 03/26/2020 1207   CHOLHDL 2.6 09/01/2023 1417   VLDL 47 (H) 12/11/2016 1708   LDLCALC 87 09/01/2023 1417    WEIGHTS: Wt Readings from Last 3 Encounters:  06/08/24 169 lb (76.7 kg)  04/14/24 171 lb 8 oz (77.8 kg)  04/07/24 166 lb (75.3 kg)    VITALS: BP (!) 148/80   Pulse 81   Ht 5' 5 (1.651 m)   Wt 169 lb (76.7 kg)   SpO2 96%   BMI 28.12 kg/m   EXAM: Deferred  EKG: EKG Interpretation Date/Time:  Wednesday June 08 2024 15:26:02 EDT Ventricular Rate:  81 PR Interval:  124 QRS Duration:  84 QT Interval:  390 QTC Calculation: 453 R Axis:   -11  Text Interpretation: Normal sinus rhythm Low voltage  QRS When compared with ECG of 09-May-2016 10:04, No significant change was found Confirmed by Mona Kent [phone]) on 06/08/2024 3:32:11 PM    ASSESSMENT: Mixed dyslipidemia, ?FH Statin intolerant-myalgias Family history of coronary disease  PLAN: 1.   Jacqueline Jenkins has had an excellent response to Repatha .  Will need to repeat lipids in we can reach out for prior authorization to continue on it.  Since she is transitioning to a new primary care provider it is  possible that the Repatha  could be prescribed at their office.  This would be fine with me and I am happy to see her back on an as needed basis to reduce her provider visits.  Kent KYM Mona, MD, [facility], FNLA, FACP  [place]  [facility]  Medical Director of the Advanced Lipid Disorders &  Cardiovascular Risk Reduction Clinic Diplomate of the American Board of Clinical Lipidology Attending Cardiologist  Direct Dial: [phone]  Fax: [phone]  Website:  [email or web address]   Kent JAYSON Mona 06/08/2024, 3:32 PM

## 2024-06-08 NOTE — Patient Instructions (Signed)
 Medication Instructions:  Dr. Mona advises that you resume Repatha  -- will need to wait for a re-authorization after labs are completed  *If you need a refill on your cardiac medications before your next appointment, please call your pharmacy*  Lab Work: FASTING NMR lipoprofile to check cholesterol   If you have labs (blood work) drawn today and your tests are completely normal, you will receive your results only by: MyChart Message (if you have MyChart) OR A paper copy in the mail If you have any lab test that is abnormal or we need to change your treatment, we will call you to review the results.  Follow-Up: At [facility], you and your health needs are our priority.  As part of our continuing mission to provide you with exceptional heart care, our providers are all part of one team.  This team includes your primary Cardiologist (physician) and Advanced Practice Providers or APPs (Physician Assistants and Nurse Practitioners) who all work together to provide you with the care you need, when you need it.  Your next appointment:    AS NEEDED with Dr. Mona for lipid clinic  We recommend signing up for the patient portal called MyChart.  Sign up information is provided on this After Visit Summary.  MyChart is used to connect with patients for Virtual Visits (Telemedicine).  Patients are able to view lab/test results, encounter notes, upcoming appointments, etc.  Non-urgent messages can be sent to your provider as well.   To learn more about what you can do with MyChart, go to [email or web address].   Other Instructions

## 2024-06-09 DIAGNOSIS — E782 Mixed hyperlipidemia: Secondary | ICD-10-CM | POA: Diagnosis not present

## 2024-06-10 LAB — NMR, LIPOPROFILE
Cholesterol, Total: 187 mg/dL (ref 100–199)
HDL Particle Number: 38.7 umol/L (ref >=30.5)
HDL-C: 66 mg/dL (ref >39)
LDL Particle Number: 1288 nmol/L — ABNORMAL HIGH (ref <1000)
LDL Size: 20.8 nm (ref >20.5)
LDL-C (NIH Calc): 101 mg/dL — ABNORMAL HIGH (ref 0–99)
LP-IR Score: 44 (ref <=45)
Small LDL Particle Number: 356 nmol/L (ref <=527)
Triglycerides: 115 mg/dL (ref 0–149)

## 2024-06-12 ENCOUNTER — Ambulatory Visit: Payer: Self-pay | Admitting: Internal Medicine

## 2024-06-13 MED ORDER — REPATHA SURECLICK 140 MG/ML ~~LOC~~ SOAJ: 140.0000 mg | SUBCUTANEOUS | 3 refills | Status: AC

## 2024-06-13 MED ORDER — EZETIMIBE 10 MG PO TABS: 10.0000 mg | ORAL_TABLET | Freq: Every day | ORAL | 3 refills | Status: AC

## 2024-06-13 NOTE — Addendum Note (Signed)
 Addended by: JODIE GAMMONS on: 06/13/2024 11:48 AM   Modules accepted: Orders

## 2024-06-15 ENCOUNTER — Telehealth: Payer: Self-pay

## 2024-06-15 ENCOUNTER — Encounter: Payer: Self-pay | Admitting: Hematology and Oncology

## 2024-06-15 ENCOUNTER — Other Ambulatory Visit (HOSPITAL_COMMUNITY): Payer: Self-pay

## 2024-06-15 NOTE — Telephone Encounter (Signed)
 Pharmacy Patient Advocate Encounter   Received notification from Physician's Office that prior authorization for REPATHA  is required/requested.   Insurance verification completed.   The patient is insured through [employer].   Per test claim: Refill too soon. PA is not needed at this time. Medication was filled 06/13/24. Next eligible fill date is 08/15/24.

## 2024-06-21 DIAGNOSIS — L820 Inflamed seborrheic keratosis: Secondary | ICD-10-CM | POA: Diagnosis not present

## 2024-06-24 ENCOUNTER — Other Ambulatory Visit: Payer: Self-pay | Admitting: Surgery

## 2024-06-24 DIAGNOSIS — I700 Atherosclerosis of aorta: Secondary | ICD-10-CM

## 2024-06-24 DIAGNOSIS — Z23 Encounter for immunization: Secondary | ICD-10-CM | POA: Diagnosis not present

## 2024-07-21 NOTE — Progress Notes (Signed)
 Office Note     CC:  follow up Requesting Provider:  Jodie Lavern CROME, MD  HPI: Jacqueline Jenkins is a 73 y.o. (01/10/1951) female who presents for surveillance follow up of prior splenic artery aneurysm. This was previously coiled by Dr. Serene in 2013. She has done well since. She denies any abdominal pain or back pain. No food fear, early satiety, post prandial pain, nausea, vomiting, or changes in bowels. She denies any pain in her legs on ambulation or rest. No tissue loss.  The pt is not on a statin for cholesterol management due to intolerance.  She is on Repatha  and zetia  for HLD The pt is not an aspirin.    Other AC:  none The pt is on ARB/HCTZ for hypertension.  The pt does not have diabetes. Tobacco hx:  never  Past Medical History:  Diagnosis Date   Allergic rhinitis, cause unspecified    Anemia    history of anemia while teenager   BRCA2 positive    Breast cancer (HCC) 2016   Left Breast Cancer   Breast cancer of upper-outer quadrant of left female breast (HCC)    chemo complete 01/2016, radiation 04/2016   DDD (degenerative disc disease)    Diverticulitis    Family history of breast cancer 08/30/2015   Dx. In maternal grandmother in her 25s-40; dx. In maternal first cousin in her 72s-60s    Family history of colon cancer 08/30/2015   Dx. In maternal uncle in his 80s    Hyperlipidemia    Hypertension    Left kidney mass 04/06/2018   Mild growth from Ct 2013 to 04/06/2018; Renal US  2020 no growth- will not follow up   Neuropathy    Osteopenia 12/2011   Spine T -0.4, Femur -2.1   Personal history of chemotherapy 2016   Left Breast Cancer   Personal history of radiation therapy 2016   Left Breast Cancer   PONV (postoperative nausea and vomiting)    Pulsatile tinnitus of right ear 10/16/2016   Due to dural av fistula. Resolved with repair     Splenic artery aneurysm 2013   COILS DONE   Vitamin D  deficiency     Past Surgical History:  Procedure Laterality Date    BREAST LUMPECTOMY Left 2016   BREAST LUMPECTOMY WITH RADIOACTIVE SEED AND SENTINEL LYMPH NODE BIOPSY Left 08/29/2015   Procedure: LEFT BREAST LUMPECTOMY WITH RADIOACTIVE SEED WITH AXILLARY SENTINEL LYMPH NODE BIOPSY;  Surgeon: Morene Olives, MD;  Location: [facility];  Service: General;  Laterality: Left;   CERVICAL DISC SURGERY  1998   COLONOSCOPY  Jan. 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   Dural AV fistula repair   IR ANGIO EXTERNAL CAROTID SEL EXT CAROTID BILAT MOD SED  02/03/2017   IR ANGIO EXTERNAL CAROTID SEL EXT CAROTID UNI R MOD SED  03/05/2017   IR ANGIO INTRA EXTRACRAN SEL COM CAROTID INNOMINATE UNI R MOD SED  03/05/2017   IR ANGIO INTRA EXTRACRAN SEL INTERNAL CAROTID BILAT MOD SED  02/03/2017   IR ANGIO VERTEBRAL SEL VERTEBRAL BILAT MOD SED  02/03/2017   IR ANGIOGRAM FOLLOW UP STUDY  03/05/2017   IR NEURO EACH ADD'L AFTER BASIC UNI RIGHT (MS)  02/03/2017   IR NEURO EACH ADD'L AFTER BASIC UNI RIGHT (MS)  03/05/2017   IR TRANSCATH/EMBOLIZ  03/05/2017   LAPAROSCOPIC BILATERAL SALPINGO OOPHERECTOMY Bilateral 05/16/2016   Procedure: LAPAROSCOPIC BILATERAL SALPINGO OOPHORECTOMY;  Surgeon: Nathanel Bunker, MD;  Location: [facility]  ORS;  Service: Gynecology;  Laterality: Bilateral;   LUMBAR DISC SURGERY  2001, 2010   PORTACATH PLACEMENT Bilateral 10/09/2015   Procedure: INSERTION PORT-A-CATH;  Surgeon: Morene Olives, MD;  Location: [facility] ORS;  Service: General;  Laterality: Bilateral;   Portacath removal     RADIOLOGY WITH ANESTHESIA N/A 03/05/2017   Procedure: ARTERIOGRAM ONYX EMBOLIZATION OF FISTULA;  Surgeon: Lanis Pupa, MD;  Location[facility] OR;  Service: Radiology;  Laterality: N/A;   RE-EXCISION OF BREAST LUMPECTOMY Left 09/07/2015   Procedure: RE-EXCISION OF LEFT BREAST LUMPECTOMY;  Surgeon: Morene Olives, MD;  Location: [facility];  Service: General;  Laterality: Left;   SPINE SURGERY  1998   cervical diskectomy   SPINE SURGERY  2001, 2010   Lumbar diskectomy    splenic aneurysm  02/10/2012   Aneurysm of splenic artery, coil placed, Dr. Army   TONSILLECTOMY  1968   VISCERAL ANGIOGRAM N/A 02/10/2012   Procedure: VISCERAL ANGIOGRAM;  Surgeon: Gaile LELON New, MD;  Location: [facility] CATH LAB;  Service: Cardiovascular;  Laterality: N/A;    Social History   Socioeconomic History   Marital status: Married    Spouse name: Not on file   Number of children: Not on file   Years of education: Not on file   Highest education level: Not on file  Occupational History   Not on file  Tobacco Use   Smoking status: Never   Smokeless tobacco: Never  Vaping Use   Vaping status: Never Used  Substance and Sexual Activity   Alcohol use: Not Currently   Drug use: No   Sexual activity: Not Currently    Partners: Male    Birth control/protection: Post-menopausal  Other Topics Concern   Not on file  Social History Narrative   Not on file   Social Drivers of Health   Financial Resource Strain: Low Risk  (04/07/2024)   Overall Financial Resource Strain (CARDIA)    Difficulty of Paying Living Expenses: Not hard at all  Food Insecurity: No Food Insecurity (04/07/2024)   Hunger Vital Sign    Worried About Running Out of Food in the Last Year: Never true    Ran Out of Food in the Last Year: Never true  Transportation Needs: No Transportation Needs (04/07/2024)   PRAPARE - Administrator, Civil Service (Medical): No    Lack of Transportation (Non-Medical): No  Physical Activity: Insufficiently Active (04/07/2024)   Exercise Vital Sign    Days of Exercise per Week: 5 days    Minutes of Exercise per Session: 20 min  Stress: No Stress Concern Present (04/07/2024)   [employer] of Occupational Health - Occupational Stress Questionnaire    Feeling of Stress: Not at all  Social Connections: Moderately Isolated (04/07/2024)   Social Connection and Isolation Panel    Frequency of Communication with Friends and Family: More than three times a week     Frequency of Social Gatherings with Friends and Family: More than three times a week    Attends Religious Services: Never    Database Administrator or Organizations: No    Attends Banker Meetings: Never    Marital Status: Married  Catering Manager Violence: Not At Risk (04/07/2024)   Humiliation, Afraid, Rape, and Kick questionnaire    Fear of Current or Ex-Partner: No    Emotionally Abused: No    Physically Abused: No    Sexually Abused: No    Family History  Problem Relation Age of  Onset   Stroke Mother    Osteoporosis Mother    Hyperlipidemia Mother    Hypertension Mother    Other Mother        varicose veins   Deep vein thrombosis Mother    Diverticulitis Mother    Breast cancer Maternal Grandmother        dx. 64s-40s; when pt's mother was 61 years old   Other Father        bleeding problems   Hypertension Father    Heart attack Father    Stroke Father    Other Sister        one sister had a hysterectomy for fibroids   Vasculitis Sister    Leukemia Maternal Aunt        dx. 20s-60s   Pancreatic cancer Maternal Uncle        dx. late 60s-early 14s; (x2 maternal uncles)   Heart attack Paternal Aunt    Stroke Paternal Aunt    Stroke Maternal Grandfather    Heart attack Paternal Grandmother    Stroke Paternal Grandmother    Heart Problems Paternal Grandfather    Colon cancer Maternal Uncle        dx. late 70s   Heart defect Maternal Aunt        possible   Breast cancer Cousin        dx. 50s-60s; maternal first   Heart attack Paternal Uncle    Stroke Paternal Uncle    Lung cancer Maternal Uncle        treated at [facility]; d. 18s    Current Outpatient Medications  Medication Sig Dispense Refill   alendronate  (FOSAMAX ) 70 MG tablet Take 1 tablet (70 mg total) by mouth once a week. Take with a full glass of water on an empty stomach. 12 tablet 3   anastrozole  (ARIMIDEX ) 1 MG tablet Take 1 tablet (1 mg total) by mouth daily. 90 tablet 3   Calcium   Carb-Cholecalciferol  (CALCIUM  1000 + D PO) Take 2 tablets by mouth daily. Gummies     Evolocumab  (REPATHA  SURECLICK) 140 MG/ML SOAJ Inject 140 mg into the skin every 14 (fourteen) days. 6 mL 3   ezetimibe  (ZETIA ) 10 MG tablet Take 1 tablet (10 mg total) by mouth daily. 90 tablet 3   Magnesium 250 MG TABS Take by mouth daily.     Multiple Vitamin (MULTIVITAMIN) tablet Take 1 tablet by mouth daily.     naproxen  sodium (ANAPROX ) 220 MG tablet Take 220 mg by mouth daily as needed (pain).     olmesartan -hydrochlorothiazide  (BENICAR  HCT) 40-25 MG tablet TAKE 1 TABLET BY MOUTH EVERY DAY 30 tablet 11   SHINGRIX injection      VITAMIN D  PO Take 5,000 Units by mouth. Takes 1 capsule every other day.     No current facility-administered medications for this visit.    Allergies  Allergen Reactions   Lescol [Fluvastatin Sodium] Other (See Comments)    Myalgia   Lipitor [Atorvastatin] Other (See Comments)    Increased LFT's   Vytorin [Ezetimibe -Simvastatin] Other (See Comments)    myalgia     REVIEW OF SYSTEMS:  Negative unless noted in HPI [X]  denotes positive finding, [ ]  denotes negative finding Cardiac  Comments:  Chest pain or chest pressure:    Shortness of breath upon exertion:    Short of breath when lying flat:    Irregular heart rhythm:        Vascular    Pain in calf, thigh, or  hip brought on by ambulation:    Pain in feet at night that wakes you up from your sleep:     Blood clot in your veins:    Leg swelling:         Pulmonary    Oxygen at home:    Productive cough:     Wheezing:         Neurologic    Sudden weakness in arms or legs:     Sudden numbness in arms or legs:     Sudden onset of difficulty speaking or slurred speech:    Temporary loss of vision in one eye:     Problems with dizziness:         Gastrointestinal    Blood in stool:     Vomited blood:         Genitourinary    Burning when urinating:     Blood in urine:        Psychiatric    Major  depression:         Hematologic    Bleeding problems:    Problems with blood clotting too easily:        Skin    Rashes or ulcers:        Constitutional    Fever or chills:      PHYSICAL EXAMINATION:  Vitals:   07/25/24 1316  BP: (!) 147/80  Pulse: 65  Temp: 97.7 F (36.5 C)  TempSrc: Temporal  Weight: 169 lb 11.2 oz (77 kg)    General:  WDWN in NAD; vital signs documented above Gait: Normal HENT: WNL, normocephalic Pulmonary: normal non-labored breathing Cardiac: regular HR Abdomen: soft, NT, non distended Vascular Exam/Pulses: 2+ radial, 2+ Dp pulses bilaterally Extremities: without ischemic changes, without Gangrene , without cellulitis; without open wounds;  Musculoskeletal: no muscle wasting or atrophy  Neurologic: A&O X 3 Psychiatric:  The pt has Normal affect.   Non-Invasive Vascular Imaging:   Mesenteric Complete: Summary:  Mesenteric: No evidence of AAA. Splenic artery measuring 3.83cm x 3.77cm.   CTA abdomen pelvis: 05/16/21 IMPRESSION: VASCULAR   1. Slight interval decrease in size of successfully excluded splenic artery aneurysm now measuring a maximum of 4.1 x 3.7 cm compared to 4.4 x 3.9 cm previously. No evidence of flow within the excluded aneurysm sac. 2. Maintained patency of the distal splenic artery secondary to collateral flow through a hypertrophic pancreaticoduodenal arcade. 3. Trace atherosclerotic calcifications along the abdominal aorta.  ASSESSMENT/PLAN:: 73 y.o. female here for surveillance follow up of prior splenic artery aneurysm. This was previously coiled by Dr. Serene in 2013. She has done well since. No associated symptoms.  - Duplex is overall stable. Splenic artery is essentially unchanged from her prior duplex in 2020 (3.61 x 3.87 cm) - Continue Repatha  and zetia  - She will follow up again in 2 years for repeat mesenteric duplex  Jacqueline Damme, PA-C [facility] [phone]  On call MD: Gretta

## 2024-07-25 ENCOUNTER — Other Ambulatory Visit: Payer: Self-pay | Admitting: Surgery

## 2024-07-25 ENCOUNTER — Ambulatory Visit (INDEPENDENT_AMBULATORY_CARE_PROVIDER_SITE_OTHER): Admitting: Physician Assistant

## 2024-07-25 ENCOUNTER — Ambulatory Visit (HOSPITAL_COMMUNITY)
Admission: RE | Admit: 2024-07-25 | Discharge: 2024-07-25 | Disposition: A | Discharge status: Home / Self Care | Source: Ambulatory Visit | Attending: Surgery | Admitting: Surgery

## 2024-07-25 VITALS — BP 147/80 | HR 65 | Temp 97.7°F | Wt 169.7 lb

## 2024-07-25 DIAGNOSIS — I728 Aneurysm of other specified arteries: Secondary | ICD-10-CM

## 2024-07-25 DIAGNOSIS — I700 Atherosclerosis of aorta: Secondary | ICD-10-CM

## 2024-09-07 ENCOUNTER — Ambulatory Visit: Admitting: Family Medicine

## 2024-09-08 ENCOUNTER — Telehealth: Payer: Self-pay

## 2024-09-08 NOTE — Telephone Encounter (Signed)
 Copied from CRM #[id number]. Topic: Clinical - Medication Question >> Sep 08, 2024  9:14 AM Thersia BROCKS wrote: Reason for RMF:[facility] called in regarding Evolocumab  (REPATHA  SURECLICK) 140 MG/ML SOAJ - 2 of the injectors didnt work, call the number and stated they will faxed the prescription to [facility] and will mail new injectors  noted

## 2024-09-12 ENCOUNTER — Telehealth: Payer: Self-pay

## 2024-09-12 NOTE — Telephone Encounter (Unsigned)
 Copied from CRM [id number]. Topic: Clinical - Prescription Issue >> Sep 12, 2024 11:52 AM Gustabo D wrote: Jeannie with [facility] is Calling to see if Received a fax regarding Repatha . Says it was sent Sep 08, 2024- will refax

## 2024-09-14 ENCOUNTER — Telehealth: Payer: Self-pay

## 2024-09-14 NOTE — Telephone Encounter (Signed)
 Got a call from Chanel that she needed a verbal order to reorder repatha  bc there was a malfunction of 2 of the pt's pen injectors. I authorized the replacement

## 2024-09-14 NOTE — Telephone Encounter (Signed)
 Form has been faxed.

## 2024-09-21 ENCOUNTER — Ambulatory Visit (INDEPENDENT_AMBULATORY_CARE_PROVIDER_SITE_OTHER): Admitting: Family Medicine

## 2024-09-21 ENCOUNTER — Encounter: Payer: Self-pay | Admitting: Family Medicine

## 2024-09-21 VITALS — BP 148/82 | HR 72 | Temp 97.2°F | Ht 65.0 in | Wt 171.0 lb

## 2024-09-21 DIAGNOSIS — M818 Other osteoporosis without current pathological fracture: Secondary | ICD-10-CM | POA: Diagnosis not present

## 2024-09-21 DIAGNOSIS — I10 Essential (primary) hypertension: Secondary | ICD-10-CM | POA: Diagnosis not present

## 2024-09-21 DIAGNOSIS — E782 Mixed hyperlipidemia: Secondary | ICD-10-CM | POA: Diagnosis not present

## 2024-09-21 DIAGNOSIS — E559 Vitamin D deficiency, unspecified: Secondary | ICD-10-CM | POA: Diagnosis not present

## 2024-09-21 DIAGNOSIS — Z170 Estrogen receptor positive status [ER+]: Secondary | ICD-10-CM

## 2024-09-21 DIAGNOSIS — Z1211 Encounter for screening for malignant neoplasm of colon: Secondary | ICD-10-CM

## 2024-09-21 DIAGNOSIS — R7303 Prediabetes: Secondary | ICD-10-CM

## 2024-09-21 DIAGNOSIS — C50412 Malignant neoplasm of upper-outer quadrant of left female breast: Secondary | ICD-10-CM

## 2024-09-21 DIAGNOSIS — G720 Drug-induced myopathy: Secondary | ICD-10-CM

## 2024-09-21 DIAGNOSIS — I728 Aneurysm of other specified arteries: Secondary | ICD-10-CM

## 2024-09-21 MED ORDER — AMLODIPINE BESYLATE 2.5 MG PO TABS: 2.5000 mg | ORAL_TABLET | Freq: Every day | ORAL | 3 refills | Status: AC

## 2024-09-21 NOTE — Patient Instructions (Signed)
 Please return in 6 months for hypertension follow up.   I will release your lab results to you on your MyChart account with further instructions. You may see the results before I do, but when I review them I will send you a message with my report or have my assistant call you if things need to be discussed. Please reply to my message with any questions. Thank you!   If you have any questions or concerns, please don't hesitate to send me a message via MyChart or call the office at [phone]. Thank you for visiting with us  today! It's our pleasure caring for you.    VISIT SUMMARY: During your visit, we discussed your blood pressure management, caffeine consumption, and other health concerns. We reviewed your current medications and made some adjustments to help better manage your conditions.  YOUR PLAN: -ESSENTIAL HYPERTENSION: Essential hypertension means high blood pressure without a known secondary cause. Your blood pressure remains elevated, possibly due to high caffeine intake. We discussed adding a low dose of amlodipine , a calcium  channel blocker, to help lower your blood pressure by dilating peripheral arteries. You are advised to gradually reduce your coffee intake to avoid withdrawal symptoms such as headaches. You are prescribed a low dose of amlodipine  2.5 mg daily and we will recheck your blood pressure in six months.  -MIXED HYPERLIPIDEMIA: Mixed hyperlipidemia means having high levels of different types of fats in your blood. We previously checked your cholesterol levels in October after starting Zetia . We will recheck your cholesterol levels to assess the effectiveness of Zetia . A cholesterol panel has been ordered.  -OSTEOPOROSIS: Osteoporosis is a condition where bones become weak and brittle. You previously experienced joint pain with Prolia  and are now taking Fosamax , which you are tolerating well. You should continue taking Fosamax .  -VITAMIN D  DEFICIENCY: Vitamin D  deficiency  means having lower than normal levels of vitamin D  in your blood. We will recheck your vitamin D  levels as part of your routine lab work. A vitamin D  level test has been ordered.  -COLORECTAL CANCER SCREENING: Colorectal cancer screening is a test to detect cancer in the colon or rectum. You are scheduled to repeat the Cologuard test and have agreed to complete it. The Cologuard test has been ordered.  INSTRUCTIONS: Please follow up in six months to recheck your blood pressure. Additionally, complete the cholesterol panel, vitamin D  level test, and Cologuard test as ordered. Continue taking your prescribed medications and gradually reduce your coffee intake.    Contains text generated by [employer].

## 2024-09-21 NOTE — Progress Notes (Signed)
 " Subjective  Chief Complaint  Patient presents with   Annual Exam    Here for annual exam with PCP. No questions or concerns. Has not fasted for labs today.    HPI: Jacqueline Jenkins is a 74 y.o. female who presents to [facility] [place] today for a Female Wellness Visit. She also has the concerns and/or needs as listed above in the chief complaint. These will be addressed in addition to the Health Maintenance Visit.   Wellness Visit: annual visit with health maintenance review and exam  HM: mammo is scheduled as is bone density. Due crc screen. Cologuard candidate. Doing well overall.  Chronic disease f/u and/or acute problem visit: (deemed necessary to be done in addition to the wellness visit): Discussed the use of AI scribe software for clinical note transcription with the patient, who gave verbal consent to proceed.  History of Present Illness LAYAAN Jenkins is a 74 year old female with hypertension who presents for blood pressure management.  Hypertension - Blood pressure remains slightly elevated above target range. - Home blood pressure readings are lower than office measurements; last home reading was 139/82 mmHg. - Elevated office readings attributed to 'white coat syndrome.' - Current antihypertensive regimen includes a diuretic and an ACE inhibitor. Benazapril high dose - no cp sob or edema  Caffeine consumption - Consumes approximately eight cups of coffee each morning. - Coffee intake begins around 6-7 AM and is completed by 11 AM. - Coffee is consumed black or with creamer; creamer adds approximately 35 calories per tablespoon. - High caffeine intake may contribute to elevated blood pressure.  Splenic artery aneurysm - Splenic artery aneurysm has been stable following vascular evaluation. I reviewed recent notes and study. Will f/u again in 2 years. Not on asa.   Osteoporosis management - History of joint pain associated with Prolia , resulting in  discontinuation of the medication. - Currently taking Fosamax  and tolerating it well. - Scheduled for bone density test in March.  Prediabetes: diet is fair. No sxs of hyperglycemia but weight is up.   Hld on repatha  and zetia  (zetia  added in late October). I reviewed last lipid clinic notes and results of NMR. Tolerateing meds well  Breast cancer on anastrazole with mammo upcoming. Doing well.   Preventive health screening - Scheduled for mammogram next week. - Agreed to complete Cologuard test again.    Assessment  1. Essential hypertension   2. Screening for colorectal cancer   3. Prediabetes   4. Statin myopathy   5. Hyperlipidemia, mixed   6. Other osteoporosis without current pathological fracture   7. Vitamin D  deficiency   8. Malignant neoplasm of upper-outer quadrant of left breast in female, estrogen receptor positive (HCC)   9. Aneurysm of splenic artery      Plan  Female Wellness Visit: Age appropriate Health Maintenance and Prevention measures were discussed with patient. Included topics are cancer screening recommendations, ways to keep healthy (see AVS) including dietary and exercise recommendations, regular eye and dental care, use of seat belts, and avoidance of moderate alcohol use and tobacco use.  BMI: discussed patient's BMI and encouraged positive lifestyle modifications to help get to or maintain a target BMI. HM needs and immunizations were addressed and ordered. See below for orders. See HM and immunization section for updates. Routine labs and screening tests ordered including cmp, cbc and lipids where appropriate. Discussed recommendations regarding Vit D and calcium  supplementation (see AVS)  Chronic disease management  visit and/or acute problem visit: Assessment and Plan Assessment & Plan Essential hypertension Blood pressure remains elevated, possibly due to high caffeine intake. Home readings are better than in-office, suggesting white coat  syndrome. Current medications include a diuretic and an ACE inhibitor. Discussed adding a low dose of amlodipine , a calcium  channel blocker, to help lower blood pressure by dilating peripheral arteries. She is advised to gradually reduce coffee intake to avoid withdrawal symptoms such as headaches. - add low dose amlodipine  2.5 mg daily - Advised gradual reduction of coffee intake - Will recheck blood pressure in six months  Mixed hyperlipidemia Cholesterol levels were previously checked in October after adding Zetia  to repatha . Current cholesterol levels are not discussed, but a recheck is planned to assess the effectiveness of Zetia . - Ordered cholesterol panel and lfts  Osteoporosis Previously on Prolia , discontinued due to joint pain. Currently on Fosamax , which is well tolerated. - Continue Fosamax  - dexa repeat this year.   Vitamin D  deficiency Vitamin D  levels to be rechecked as part of routine lab work. - Ordered vitamin D  level  Breast ca on surveillance and anastrazole.   Colorectal cancer screening Cologuard test is scheduled and she is agreeable to repeat the test. - Ordered Cologuard test    Follow up: 6 mo to recheck bp.   Orders Placed This Encounter  Procedures   Cologuard   VITAMIN D  25 Hydroxy (Vit-D Deficiency, Fractures)   CBC with Differential/Platelet   Comprehensive metabolic panel with GFR   Lipid panel   TSH   Hemoglobin A1c   Meds ordered this encounter  Medications   amLODipine  (NORVASC ) 2.5 MG tablet    Sig: Take 1 tablet (2.5 mg total) by mouth daily.    Dispense:  90 tablet    Refill:  3      Body mass index is 28.46 kg/m. Wt Readings from Last 3 Encounters:  09/21/24 171 lb (77.6 kg)  07/25/24 169 lb 11.2 oz (77 kg)  06/08/24 169 lb (76.7 kg)     Patient Active Problem List   Diagnosis Date Noted Date Diagnosed   Prediabetes 12/07/2023     Priority: High   Statin myopathy 08/01/2020     Priority: High   Aortic  atherosclerosis (HCC) by Abd CT scan in 2021 07/31/2020     Priority: High    Per CT 10/2019    BRCA2 positive 09/19/2015     Priority: High    Pathogenic mutation c.[id number] (p.Glu1646GlnfsX23) found in one copy of the BRCA2 gene.  Found via Breast/Ovarian Cancer Panel through [facility] in [place], [place].  Date of report is September 12, 2015.      Breast cancer of upper-outer quadrant of left female breast (HCC) 08/21/2015     Priority: High   Essential hypertension 06/07/2015     Priority: High    Benicar  HCT 40/25 daily    Hyperlipidemia, mixed      Priority: High   Aneurysm of splenic artery 01/19/2012     Priority: High   FHx: heart disease 12/28/2017     Priority: Medium    Osteoporosis 06/17/2017     Priority: Medium     Follows with Dr. Odean Started fosamax  x 09/2019 after last DEXA -2.5 DEXA 2024 lowest T = -3.0 forearm. 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 Nundkumar 2018    Lumbar  degenerative disc disease      Priority: Medium    Screening for colorectal cancer 12/07/2023     Priority: Low    Dr. Belvie Just: colonoscopy 2013; negative cologuard 2023    Sensorineural hearing loss (SNHL) of both ears 11/28/2016     Priority: Low   Genetic testing 09/19/2015     Priority: Low    Positive for pathogenic mutation, c.[id number] (p.Glu1646GlnfsX23), in one copy of the BRCA2 gene.  One variant of uncertain significance (VUS) called c.320-5T>A (IVS2-5T>A) found in one copy of the CHEK2 gene.  Test performed was the 20-gene Breast/Ovarian Cancer Panel offered by [facility].  The Breast/Ovarian Cancer Panel offered by [facility] [place], [place]) includes sequencing and deletion/duplication analysis for the following 19 genes:  ATM, BARD1, BRCA1, BRCA2, BRIP1, CDH1, CHEK2, FANCC, MLH1, MSH2, MSH6, NBN,  PALB2, PMS2, PTEN, RAD51C, RAD51D, TP53, and XRCC2.  This panel also includes deletion/duplication analysis (without sequencing) for one gene, EPCAM.  Date of report is September 12, 2015.       Allergic rhinitis      Priority: Low   Vitamin D  deficiency      Priority: Low   Health Maintenance  Topic Date Due   Bone Density Scan  09/23/2024   Fecal DNA (Cologuard)  11/14/2024   COVID-19 Vaccine (9 - Mixed Product risk 2025-26 season) 12/14/2024   DTaP/Tdap/Td (5 - Tdap) 01/21/2025   Mammogram  03/25/2025   Medicare Annual Wellness (AWV)  04/07/2025   Influenza Vaccine  Completed   Hepatitis C Screening  Completed   Zoster Vaccines- Shingrix  Completed   Meningococcal B Vaccine  Aged Out   Pneumococcal Vaccine: 50+ Years  Discontinued   Colonoscopy  Discontinued   Immunization History  Administered Date(s) Administered   DT (Pediatric) 01/22/2015   Fluad Quad(high Dose 65+) 06/15/2024   INFLUENZA, HIGH DOSE SEASONAL PF 06/08/2014, 05/27/2017, 06/01/2018, 06/02/2019, 06/01/2020, 05/31/2022, 05/31/2023   Influenza Split 06/08/2014, 06/02/2016   Influenza-Unspecified 06/14/2013, 06/04/2015, 05/27/2017   Meningococcal Conjugate 09/02/2011   Meningococcal polysaccharide vaccine (MPSV4) 01/23/2012   Moderna Sars-Covid-2 Vaccination 10/17/2019, 11/15/2019   PFIZER(Purple Top)SARS-COV-2 Vaccination 06/27/2020, 01/22/2021   PPD Test 01/18/2014   Pfizer Covid-19 Vaccine Bivalent Booster 54yrs & up 06/07/2021   Pfizer(Comirnaty)Fall Seasonal Vaccine 12 years and older 07/04/2022, 05/31/2023, 06/15/2024   Pneumococcal Conjugate-13 12/31/2011, 01/23/2012, 06/17/2017   Pneumococcal Polysaccharide-23 09/02/2011   Pneumococcal-Unspecified 09/22/1995, 09/02/2011   Td 11/27/1994, 04/29/2004, 01/22/2015   Zoster Recombinant(Shingrix) 01/29/2024, 02/15/2024, 05/14/2024   We updated and reviewed the patient's past history in detail and it is documented below. Allergies: Patient is allergic to  lescol [fluvastatin sodium], lipitor [atorvastatin], and vytorin [ezetimibe -simvastatin]. Past Medical History Patient  has a past medical history of Allergic rhinitis, cause unspecified, Anemia, Arthritis, BRCA2 positive, Breast cancer (HCC) (2016), Breast cancer of upper-outer quadrant of left female breast (HCC), DDD (degenerative disc disease), Diverticulitis, Family history of breast cancer (08/30/2015), Family history of colon cancer (08/30/2015), Hyperlipidemia, Hypertension, Left kidney mass (04/06/2018), Neuropathy, Osteopenia (12/2011), Personal history of chemotherapy (2016), Personal history of radiation therapy (2016), PONV (postoperative nausea and vomiting), Pulsatile tinnitus of right ear (10/16/2016), Splenic artery aneurysm (2013), and Vitamin D  deficiency. Past Surgical History Patient  has a past surgical history that includes Tonsillectomy (1968); Spine surgery (1998); Spine surgery (2001, 2010); Cervical disc surgery (1998); Lumbar disc surgery (2001, 2010); splenic aneurysm (02/10/2012); Colonoscopy (Jan. 7, 2014); visceral angiogram (N/A, 02/10/2012); Breast lumpectomy with radioactive seed and sentinel lymph node biopsy (Left, 08/29/2015); Re-excision  of breast lumpectomy (Left, 09/07/2015); Portacath placement (Bilateral, 10/09/2015); Portacath removal; Laparoscopic bilateral salpingo oophorectomy (Bilateral, 05/16/2016); IR ANGIO INTRA EXTRACRAN SEL INTERNAL CAROTID BILAT MOD SED (02/03/2017); IR NEURO EACH ADD'L AFTER BASIC UNI RIGHT (MS) (02/03/2017); IR ANGIO VERTEBRAL SEL VERTEBRAL BILAT MOD SED (02/03/2017); IR ANGIO EXTERNAL CAROTID SEL EXT CAROTID BILAT MOD SED (02/03/2017); Radiology with anesthesia (N/A, 03/05/2017); IR NEURO EACH ADD'L AFTER BASIC UNI RIGHT (MS) (03/05/2017); IR Angiogram Follow Up Study (03/05/2017); IR Transcath/Emboliz (03/05/2017); IR ANGIO INTRA EXTRACRAN SEL COM CAROTID INNOMINATE UNI R MOD SED (03/05/2017); IR ANGIO EXTERNAL CAROTID SEL EXT CAROTID UNI R MOD SED (03/05/2017);  Breast lumpectomy (Left, 2016); and dural AV fistula (03/2018). Family History: Patient family history includes Breast cancer in her cousin and maternal grandmother; Colon cancer in her maternal uncle; Deep vein thrombosis in her mother; Diverticulitis in her mother; Heart Problems in her paternal grandfather; Heart attack in her father, paternal aunt, paternal grandmother, and paternal uncle; Heart defect in her maternal aunt; Hyperlipidemia in her mother; Hypertension in her father and mother; Leukemia in her maternal aunt; Lung cancer in her maternal uncle; Osteoporosis in her mother; Other in her father, mother, and sister; Pancreatic cancer in her maternal uncle; Stroke in her father, maternal grandfather, mother, paternal aunt, paternal grandmother, paternal uncle, and sister; Vasculitis in her sister. Social History:  Patient  reports that she has never smoked. She has never used smokeless tobacco. She reports that she does not currently use alcohol. She reports that she does not use drugs.  Review of Systems: Constitutional: negative for fever or malaise Ophthalmic: negative for photophobia, double vision or loss of vision Cardiovascular: negative for chest pain, dyspnea on exertion, or new LE swelling Respiratory: negative for SOB or persistent cough Gastrointestinal: negative for abdominal pain, change in bowel habits or melena Genitourinary: negative for dysuria or gross hematuria, no abnormal uterine bleeding or disharge Musculoskeletal: negative for new gait disturbance or muscular weakness Integumentary: negative for new or persistent rashes, no breast lumps Neurological: negative for TIA or stroke symptoms Psychiatric: negative for SI or delusions Allergic/Immunologic: negative for hives  Patient Care Team    Relationship Specialty Notifications Start End  Jodie Lavern CROME, MD PCP - General Family Medicine  12/07/23   Serene Gaile ORN, MD Consulting Physician Vascular Surgery   01/19/14   Rollin Dover, MD Consulting Physician Gastroenterology  01/19/14   Jordan, Amy, MD Consulting Physician Dermatology  01/19/14   Odean Potts, MD Consulting Physician Hematology and Oncology Admissions 08/20/15   Rosan Credit, MD Consulting Physician Ophthalmology  10/19/18   Estelle Service, MD Consulting Physician Obstetrics and Gynecology  10/19/18     Objective  Vitals: BP (!) 148/82 (BP Location: Left Arm, Patient Position: Sitting, Cuff Size: Normal)   Pulse 72   Temp (!) 97.2 F (36.2 C) (Temporal)   Ht 5' 5 (1.651 m)   Wt 171 lb (77.6 kg)   SpO2 98%   BMI 28.46 kg/m  General:  Well developed, well nourished, no acute distress  Psych:  Alert and orientedx3,normal mood and affect HEENT:  Normocephalic, atraumatic, non-icteric sclera,  supple neck without adenopathy, mass or thyromegaly Cardiovascular:  Normal S1, S2, RRR without gallop, rub or murmur Respiratory:  Good breath sounds bilaterally, CTAB with normal respiratory effort Gastrointestinal: normal bowel sounds, soft, non-tender, no noted masses. No HSM MSK: extremities without edema, joints without erythema or swelling Neurologic:    Mental status is normal.  Gross motor and sensory exams are normal.  No tremor  Commons side effects, risks, benefits, and alternatives for medications and treatment plan prescribed today were discussed, and the patient expressed understanding of the given instructions. Patient is instructed to call or message via MyChart if he/she has any questions or concerns regarding our treatment plan. No barriers to understanding were identified. We discussed Red Flag symptoms and signs in detail. Patient expressed understanding regarding what to do in case of urgent or emergency type symptoms.  Medication list was reconciled, printed and provided to the patient in AVS. Patient instructions and summary information was reviewed with the patient as documented in the AVS. This note was  prepared with assistance of Dragon voice recognition software. Occasional wrong-word or sound-a-like substitutions may have occurred due to the inherent limitations of voice recognition software     "

## 2024-09-22 LAB — COMPREHENSIVE METABOLIC PANEL WITH GFR
ALT: 23 U/L (ref 3–35)
AST: 27 U/L (ref 5–37)
Albumin: 4.3 g/dL (ref 3.5–5.2)
Alkaline Phosphatase: 40 U/L (ref 39–117)
BUN: 20 mg/dL (ref 6–23)
CO2: 26 meq/L (ref 19–32)
Calcium: 9.8 mg/dL (ref 8.4–10.5)
Chloride: 94 meq/L — ABNORMAL LOW (ref 96–112)
Creatinine, Ser: 0.7 mg/dL (ref 0.40–1.20)
GFR: 84.63 mL/min
Glucose, Bld: 81 mg/dL (ref 70–99)
Potassium: 4 meq/L (ref 3.5–5.1)
Sodium: 127 meq/L — ABNORMAL LOW (ref 135–145)
Total Bilirubin: 0.4 mg/dL (ref 0.2–1.2)
Total Protein: 7.5 g/dL (ref 6.0–8.3)

## 2024-09-22 LAB — CBC WITH DIFFERENTIAL/PLATELET
Basophils Absolute: 0.1 K/uL (ref 0.0–0.1)
Basophils Relative: 1 % (ref 0.0–3.0)
Eosinophils Absolute: 0.3 K/uL (ref 0.0–0.7)
Eosinophils Relative: 3.8 % (ref 0.0–5.0)
HCT: 35.9 % — ABNORMAL LOW (ref 36.0–46.0)
Hemoglobin: 12.4 g/dL (ref 12.0–15.0)
Lymphocytes Relative: 12.9 % (ref 12.0–46.0)
Lymphs Abs: 0.9 K/uL (ref 0.7–4.0)
MCHC: 34.6 g/dL (ref 30.0–36.0)
MCV: 91.3 fl (ref 78.0–100.0)
Monocytes Absolute: 0.4 K/uL (ref 0.1–1.0)
Monocytes Relative: 6.3 % (ref 3.0–12.0)
Neutro Abs: 5.2 K/uL (ref 1.4–7.7)
Neutrophils Relative %: 76 % (ref 43.0–77.0)
Platelets: 348 K/uL (ref 150.0–400.0)
RBC: 3.94 Mil/uL (ref 3.87–5.11)
RDW: 13.2 % (ref 11.5–15.5)
WBC: 6.9 K/uL (ref 4.0–10.5)

## 2024-09-22 LAB — LIPID PANEL
Cholesterol: 136 mg/dL (ref 28–200)
HDL: 57.5 mg/dL
LDL Cholesterol: 58 mg/dL (ref 10–99)
NonHDL: 78.43
Total CHOL/HDL Ratio: 2
Triglycerides: 102 mg/dL (ref 10.0–149.0)
VLDL: 20.4 mg/dL (ref 0.0–40.0)

## 2024-09-22 LAB — VITAMIN D 25 HYDROXY (VIT D DEFICIENCY, FRACTURES): VITD: 49.82 ng/mL (ref 30.00–100.00)

## 2024-09-22 LAB — TSH: TSH: 0.88 u[IU]/mL (ref 0.35–5.50)

## 2024-09-22 LAB — HEMOGLOBIN A1C: Hgb A1c MFr Bld: 6.1 % (ref 4.6–6.5)

## 2024-09-26 ENCOUNTER — Ambulatory Visit: Payer: Self-pay | Admitting: Family Medicine

## 2024-09-26 ENCOUNTER — Other Ambulatory Visit: Payer: Self-pay | Admitting: Family Medicine

## 2024-09-26 DIAGNOSIS — E871 Hypo-osmolality and hyponatremia: Secondary | ICD-10-CM

## 2024-09-26 MED ORDER — OLMESARTAN MEDOXOMIL-HCTZ 40-12.5 MG PO TABS: 1.0000 | ORAL_TABLET | Freq: Every day | ORAL | 3 refills | Status: DC

## 2024-09-26 NOTE — Progress Notes (Signed)
 Please call patient:lab results show sodium levels are getting too low on bp pill so I have ordered a change: olmesartan - hctz 40/12.5 instead of the 40/25 to be taken daily. Please schedule lab visit in 2 weeks to ensure levels are stable and not worsening. F/u 3 months for bp check in office with me. Sugar test remains in prediabetic range. Cholesterol, liver and kidney are all good.

## 2024-09-28 ENCOUNTER — Telehealth: Payer: Self-pay

## 2024-09-28 ENCOUNTER — Other Ambulatory Visit (HOSPITAL_COMMUNITY): Payer: Self-pay

## 2024-09-28 ENCOUNTER — Ambulatory Visit

## 2024-09-28 NOTE — Telephone Encounter (Signed)
 Pharmacy Patient Advocate Encounter   Received notification from RX Request Messages that prior authorization for Olmesartan  Medoxomil-HCTZ 40-12.5MG  tablets is required/requested.   Insurance verification completed.   The patient is insured through [facility].   Per test claim: PA required; PA submitted to above mentioned insurance via Latent Key/confirmation #/EOC [id number] Status is pending

## 2024-09-29 ENCOUNTER — Telehealth: Payer: Self-pay

## 2024-09-29 NOTE — Telephone Encounter (Signed)
 Copied from CRM [id number]. Topic: Medical Record Request - Other >> Sep 29, 2024  2:13 PM Olam RAMAN wrote: Reason for CRM: morgan from [place] dept [phone] for olmesartan  needs to speak to someone on what meds pt has tried and failed, and needs medical records Fax: [phone] ref [id number] [phone] [facility]

## 2024-09-30 NOTE — Telephone Encounter (Signed)
 Pharmacy Patient Advocate Encounter  Received notification from [facility] MEDICARE that Prior Authorization for Olmesartan  Medoxomil-HCTZ 40-12.5MG  tablets  has been DENIED.  Full denial letter will be uploaded to the media tab. See denial reason below.   PA #/Case ID/Reference #: [id number]

## 2024-09-30 NOTE — Telephone Encounter (Signed)
 Noted.

## 2024-09-30 NOTE — Telephone Encounter (Signed)
-   Called insurance and answered clinical questions over the phone, determination pending.

## 2024-10-01 NOTE — Telephone Encounter (Signed)
 Not needed. Keeping olmesartan  hydrochlorothiazide  40/25 and added amlodipine  2.5.

## 2024-10-04 ENCOUNTER — Ambulatory Visit

## 2024-10-11 ENCOUNTER — Ambulatory Visit

## 2024-10-13 ENCOUNTER — Other Ambulatory Visit

## 2024-11-17 ENCOUNTER — Other Ambulatory Visit (HOSPITAL_BASED_OUTPATIENT_CLINIC_OR_DEPARTMENT_OTHER)

## 2024-12-28 ENCOUNTER — Ambulatory Visit: Admitting: Family Medicine

## 2025-03-17 ENCOUNTER — Other Ambulatory Visit

## 2025-03-22 ENCOUNTER — Ambulatory Visit: Admitting: Family Medicine

## 2025-04-12 ENCOUNTER — Ambulatory Visit

## 2025-04-13 ENCOUNTER — Ambulatory Visit: Admitting: Hematology and Oncology
